# Patient Record
Sex: Female | Born: 1945
Health system: Southern US, Community
[De-identification: ages and names within clinical notes are randomized; demographics above are authoritative.]

## PROBLEM LIST (undated history)

## (undated) DIAGNOSIS — F410 Panic disorder [episodic paroxysmal anxiety] without agoraphobia: Secondary | ICD-10-CM

## (undated) DIAGNOSIS — M810 Age-related osteoporosis without current pathological fracture: Secondary | ICD-10-CM

## (undated) DIAGNOSIS — G4700 Insomnia, unspecified: Secondary | ICD-10-CM

## (undated) DIAGNOSIS — Z8489 Family history of other specified conditions: Secondary | ICD-10-CM

## (undated) DIAGNOSIS — K219 Gastro-esophageal reflux disease without esophagitis: Secondary | ICD-10-CM

## (undated) DIAGNOSIS — I10 Essential (primary) hypertension: Secondary | ICD-10-CM

## (undated) DIAGNOSIS — G2581 Restless legs syndrome: Secondary | ICD-10-CM

## (undated) DIAGNOSIS — E7800 Pure hypercholesterolemia, unspecified: Secondary | ICD-10-CM

## (undated) DIAGNOSIS — F329 Major depressive disorder, single episode, unspecified: Secondary | ICD-10-CM

## (undated) DIAGNOSIS — F32A Depression, unspecified: Secondary | ICD-10-CM

## (undated) DIAGNOSIS — IMO0001 Reserved for inherently not codable concepts without codable children: Secondary | ICD-10-CM

## (undated) DIAGNOSIS — M1990 Unspecified osteoarthritis, unspecified site: Secondary | ICD-10-CM

## (undated) HISTORY — DX: Reserved for inherently not codable concepts without codable children: IMO0001

## (undated) HISTORY — PX: RECTAL SURGERY: SHX760

## (undated) HISTORY — DX: Panic disorder (episodic paroxysmal anxiety): F41.0

## (undated) HISTORY — DX: Major depressive disorder, single episode, unspecified: F32.9

## (undated) HISTORY — DX: Restless legs syndrome: G25.81

## (undated) HISTORY — PX: DILATION AND CURETTAGE OF UTERUS: SHX78

## (undated) HISTORY — DX: Depression, unspecified: F32.A

## (undated) HISTORY — DX: Age-related osteoporosis without current pathological fracture: M81.0

## (undated) HISTORY — PX: JOINT REPLACEMENT: SHX530

## (undated) HISTORY — PX: BLADDER SURGERY: SHX569

## (undated) HISTORY — PX: TONSILLECTOMY: SUR1361

## (undated) HISTORY — DX: Insomnia, unspecified: G47.00

## (undated) HISTORY — DX: Gastro-esophageal reflux disease without esophagitis: K21.9

## (undated) HISTORY — PX: REPLACEMENT TOTAL KNEE: SUR1224

---

## 1985-09-22 HISTORY — PX: BREAST BIOPSY: SHX20

## 1996-09-22 HISTORY — PX: BREAST CYST EXCISION: SHX579

## 1998-11-01 ENCOUNTER — Other Ambulatory Visit: Admission: RE | Admit: 1998-11-01 | Discharge: 1998-11-01 | Payer: Self-pay | Admitting: *Deleted

## 1999-08-13 ENCOUNTER — Encounter: Payer: Self-pay | Admitting: Emergency Medicine

## 1999-08-13 ENCOUNTER — Inpatient Hospital Stay (HOSPITAL_COMMUNITY): Admission: AD | Admit: 1999-08-13 | Discharge: 1999-08-13 | Payer: Self-pay | Admitting: *Deleted

## 1999-08-13 ENCOUNTER — Emergency Department (HOSPITAL_COMMUNITY): Admission: EM | Admit: 1999-08-13 | Discharge: 1999-08-13 | Payer: Self-pay | Admitting: Emergency Medicine

## 1999-09-23 HISTORY — PX: ABDOMINAL HYSTERECTOMY: SHX81

## 1999-11-20 ENCOUNTER — Other Ambulatory Visit: Admission: RE | Admit: 1999-11-20 | Discharge: 1999-11-20 | Payer: Self-pay | Admitting: *Deleted

## 2000-11-19 ENCOUNTER — Other Ambulatory Visit: Admission: RE | Admit: 2000-11-19 | Discharge: 2000-11-19 | Payer: Self-pay | Admitting: *Deleted

## 2001-03-09 ENCOUNTER — Ambulatory Visit (HOSPITAL_COMMUNITY): Admission: RE | Admit: 2001-03-09 | Discharge: 2001-03-09 | Payer: Self-pay | Admitting: *Deleted

## 2001-03-09 ENCOUNTER — Encounter: Payer: Self-pay | Admitting: *Deleted

## 2001-09-22 HISTORY — PX: KNEE SURGERY: SHX244

## 2001-11-15 ENCOUNTER — Other Ambulatory Visit: Admission: RE | Admit: 2001-11-15 | Discharge: 2001-11-15 | Payer: Self-pay | Admitting: *Deleted

## 2002-03-10 ENCOUNTER — Ambulatory Visit (HOSPITAL_COMMUNITY): Admission: RE | Admit: 2002-03-10 | Discharge: 2002-03-10 | Payer: Self-pay | Admitting: *Deleted

## 2002-03-10 ENCOUNTER — Encounter: Payer: Self-pay | Admitting: *Deleted

## 2002-11-21 ENCOUNTER — Other Ambulatory Visit: Admission: RE | Admit: 2002-11-21 | Discharge: 2002-11-21 | Payer: Self-pay | Admitting: *Deleted

## 2003-03-13 ENCOUNTER — Ambulatory Visit (HOSPITAL_COMMUNITY): Admission: RE | Admit: 2003-03-13 | Discharge: 2003-03-13 | Payer: Self-pay | Admitting: *Deleted

## 2003-03-13 ENCOUNTER — Encounter: Payer: Self-pay | Admitting: *Deleted

## 2003-03-21 ENCOUNTER — Inpatient Hospital Stay (HOSPITAL_COMMUNITY): Admission: RE | Admit: 2003-03-21 | Discharge: 2003-03-22 | Payer: Self-pay | Admitting: *Deleted

## 2003-03-21 ENCOUNTER — Encounter (INDEPENDENT_AMBULATORY_CARE_PROVIDER_SITE_OTHER): Payer: Self-pay | Admitting: Specialist

## 2003-11-29 ENCOUNTER — Other Ambulatory Visit: Admission: RE | Admit: 2003-11-29 | Discharge: 2003-11-29 | Payer: Self-pay | Admitting: *Deleted

## 2004-01-05 ENCOUNTER — Ambulatory Visit (HOSPITAL_COMMUNITY): Admission: RE | Admit: 2004-01-05 | Discharge: 2004-01-05 | Payer: Self-pay | Admitting: Orthopedic Surgery

## 2004-03-14 ENCOUNTER — Ambulatory Visit (HOSPITAL_COMMUNITY): Admission: RE | Admit: 2004-03-14 | Discharge: 2004-03-14 | Payer: Self-pay | Admitting: Family Medicine

## 2004-09-19 ENCOUNTER — Ambulatory Visit: Payer: Self-pay | Admitting: Internal Medicine

## 2004-09-19 ENCOUNTER — Ambulatory Visit (HOSPITAL_COMMUNITY): Admission: RE | Admit: 2004-09-19 | Discharge: 2004-09-19 | Payer: Self-pay | Admitting: Internal Medicine

## 2004-09-19 HISTORY — PX: COLONOSCOPY: SHX174

## 2004-11-28 ENCOUNTER — Other Ambulatory Visit: Admission: RE | Admit: 2004-11-28 | Discharge: 2004-11-28 | Payer: Self-pay | Admitting: *Deleted

## 2005-03-17 ENCOUNTER — Ambulatory Visit (HOSPITAL_COMMUNITY): Admission: RE | Admit: 2005-03-17 | Discharge: 2005-03-17 | Payer: Self-pay | Admitting: Family Medicine

## 2005-04-03 ENCOUNTER — Ambulatory Visit (HOSPITAL_COMMUNITY): Admission: RE | Admit: 2005-04-03 | Discharge: 2005-04-03 | Payer: Self-pay | Admitting: Otolaryngology

## 2005-08-01 ENCOUNTER — Ambulatory Visit (HOSPITAL_COMMUNITY): Admission: RE | Admit: 2005-08-01 | Discharge: 2005-08-01 | Payer: Self-pay | Admitting: Family Medicine

## 2005-12-02 ENCOUNTER — Other Ambulatory Visit: Admission: RE | Admit: 2005-12-02 | Discharge: 2005-12-02 | Payer: Self-pay | Admitting: *Deleted

## 2006-03-19 ENCOUNTER — Ambulatory Visit (HOSPITAL_COMMUNITY): Admission: RE | Admit: 2006-03-19 | Discharge: 2006-03-19 | Payer: Self-pay | Admitting: *Deleted

## 2006-03-24 ENCOUNTER — Ambulatory Visit (HOSPITAL_COMMUNITY): Admission: RE | Admit: 2006-03-24 | Discharge: 2006-03-24 | Payer: Self-pay | Admitting: *Deleted

## 2007-03-22 ENCOUNTER — Ambulatory Visit (HOSPITAL_COMMUNITY): Admission: RE | Admit: 2007-03-22 | Discharge: 2007-03-22 | Payer: Self-pay | Admitting: *Deleted

## 2008-02-28 ENCOUNTER — Other Ambulatory Visit: Admission: RE | Admit: 2008-02-28 | Discharge: 2008-02-28 | Payer: Self-pay | Admitting: Gynecology

## 2008-03-22 ENCOUNTER — Ambulatory Visit (HOSPITAL_COMMUNITY): Admission: RE | Admit: 2008-03-22 | Discharge: 2008-03-22 | Payer: Self-pay | Admitting: Family Medicine

## 2008-09-22 HISTORY — PX: RECTOCELE REPAIR: SHX761

## 2008-09-22 HISTORY — PX: CYSTOCELE REPAIR: SHX163

## 2008-10-10 ENCOUNTER — Inpatient Hospital Stay (HOSPITAL_COMMUNITY): Admission: RE | Admit: 2008-10-10 | Discharge: 2008-10-11 | Payer: Self-pay | Admitting: Obstetrics and Gynecology

## 2009-02-01 ENCOUNTER — Ambulatory Visit (HOSPITAL_COMMUNITY): Admission: RE | Admit: 2009-02-01 | Discharge: 2009-02-01 | Payer: Self-pay | Admitting: Family Medicine

## 2009-03-23 ENCOUNTER — Ambulatory Visit (HOSPITAL_COMMUNITY): Admission: RE | Admit: 2009-03-23 | Discharge: 2009-03-23 | Payer: Self-pay | Admitting: Family Medicine

## 2009-05-30 ENCOUNTER — Ambulatory Visit (HOSPITAL_COMMUNITY): Admission: RE | Admit: 2009-05-30 | Discharge: 2009-05-30 | Payer: Self-pay | Admitting: Family Medicine

## 2009-06-13 ENCOUNTER — Encounter (HOSPITAL_COMMUNITY): Admission: RE | Admit: 2009-06-13 | Discharge: 2009-06-21 | Payer: Self-pay | Admitting: Family Medicine

## 2009-06-22 ENCOUNTER — Encounter (HOSPITAL_COMMUNITY): Admission: RE | Admit: 2009-06-22 | Discharge: 2009-07-22 | Payer: Self-pay | Admitting: Family Medicine

## 2010-03-26 ENCOUNTER — Ambulatory Visit (HOSPITAL_COMMUNITY): Admission: RE | Admit: 2010-03-26 | Discharge: 2010-03-26 | Payer: Self-pay | Admitting: Family Medicine

## 2010-10-15 ENCOUNTER — Ambulatory Visit (HOSPITAL_COMMUNITY)
Admission: RE | Admit: 2010-10-15 | Discharge: 2010-10-15 | Payer: Self-pay | Source: Home / Self Care | Attending: Family Medicine | Admitting: Family Medicine

## 2010-10-16 ENCOUNTER — Ambulatory Visit (HOSPITAL_COMMUNITY)
Admission: RE | Admit: 2010-10-16 | Discharge: 2010-10-16 | Payer: Self-pay | Source: Home / Self Care | Attending: Family Medicine | Admitting: Family Medicine

## 2011-01-06 LAB — CBC
HCT: 30.3 % — ABNORMAL LOW (ref 36.0–46.0)
HCT: 38.3 % (ref 36.0–46.0)
Hemoglobin: 10.6 g/dL — ABNORMAL LOW (ref 12.0–15.0)
MCHC: 34.1 g/dL (ref 30.0–36.0)
MCHC: 35 g/dL (ref 30.0–36.0)
MCV: 91.2 fL (ref 78.0–100.0)
RBC: 3.33 MIL/uL — ABNORMAL LOW (ref 3.87–5.11)
RBC: 4.24 MIL/uL (ref 3.87–5.11)
RDW: 12.5 % (ref 11.5–15.5)
WBC: 6.3 10*3/uL (ref 4.0–10.5)

## 2011-01-06 LAB — URINALYSIS, ROUTINE W REFLEX MICROSCOPIC
Bilirubin Urine: NEGATIVE
Ketones, ur: NEGATIVE mg/dL
Specific Gravity, Urine: 1.015 (ref 1.005–1.030)
Urobilinogen, UA: 0.2 mg/dL (ref 0.0–1.0)

## 2011-01-06 LAB — COMPREHENSIVE METABOLIC PANEL
ALT: 21 U/L (ref 0–35)
Alkaline Phosphatase: 50 U/L (ref 39–117)
Calcium: 9 mg/dL (ref 8.4–10.5)
GFR calc Af Amer: >60 mL/min (ref >60)
Sodium: 137 mEq/L (ref 135–145)
Total Bilirubin: 0.7 mg/dL (ref 0.3–1.2)
Total Protein: 7.1 g/dL (ref 6.0–8.3)

## 2011-01-06 LAB — BASIC METABOLIC PANEL
Calcium: 7.9 mg/dL — ABNORMAL LOW (ref 8.4–10.5)
Chloride: 108 mEq/L (ref 96–112)
GFR calc non Af Amer: >60 mL/min (ref >60)
Potassium: 4.4 mEq/L (ref 3.5–5.1)

## 2011-02-04 NOTE — Op Note (Signed)
NAME:  Allison Freeman, Allison Freeman NO.:  [id number]   MEDICAL RECORD NO.:  [id number]          PATIENT TYPE:  INP   LOCATION:  9316                          FACILITY:  [facility]   PHYSICIAN:  Randye Lobo, [name]   DATE OF BIRTH:  1946/03/09   DATE OF PROCEDURE:  DATE OF DISCHARGE:                               OPERATIVE REPORT   PREOPERATIVE DIAGNOSES:  1. Genuine stress incontinence.  2. Incomplete vaginal prolapse.   POSTOPERATIVE DIAGNOSES:  1. Genuine stress incontinence.  2. Incomplete vaginal prolapse.   PROCEDURE:  Anterior and posterior colporrhaphy, tension-free vaginal  tape, mid urethral sling, cystoscopy.   SURGEON:  Randye Lobo, MD   ASSISTANT:  Miguel Aschoff, MD   ANESTHESIA:  General endotracheal, local with 0.5% lidocaine with  epinephrine 1:200,000.   IV FLUIDS:  1600 mL Ringer lactate.   ESTIMATED BLOOD LOSS:  50 mL.   URINE OUTPUT:  Quantity sufficient.   COMPLICATIONS:  None.   INDICATIONS FOR THE PROCEDURE:  The patient is a 65 year old, gravida 3,  para 2-0-1-2, Caucasian female, status post total vaginal hysterectomy  and status post bilateral salpingo-oophorectomy, who was referred by Dr.  Teodora Medici for evaluation and treatment of urinary incontinence.  The  patient reports leakage of urine with coughing, sneezing, and laughing,  and she reports a history of constipation.  On pelvic examination, the  patient in the office was noted to have both a cystocele and rectocele.  Urodynamic testing confirmed the presence of genuine stress  incontinence.  A plan is now made to proceed with an anterior and  posterior colporrhaphy along with a tension-free vaginal tape mid  urethral sling and cystoscopy after risks, benefits, and alternatives  are reviewed.   FINDINGS:  Exam under anesthesia revealed a very minimal cystocele.  There was a second-degree rectocele.  The cervix was absent.  Cystoscopy  demonstrated the bladder to be normal throughout  360 degrees including  the bladder dome and trigone.  There was no evidence of a foreign body  in the bladder or the urethra during placement of the sling.  The  ureters were noted to be patent bilaterally.   SPECIMENS:  None.   PROCEDURE:  The patient was reidentified in the preoperative hold area.  She received ciprofloxacin 400 mg IV for antibiotic prophylaxis, and she  received both TED hose and PAS stockings for DVT prophylaxis.   In the operating room, general endotracheal anesthesia was induced and  the patient was then placed in the dorsal lithotomy position.  The lower  abdomen, vagina, and perineum were then sterilely prepped and draped.  A  Foley catheter was placed inside the bladder.   A weighted speculum was placed inside the vagina, and Allis clamps were  placed along the anterior vaginal wall from a distance of 1 cm below the  urethral meatus down to a distance of 4 cm.  The vaginal mucosa was  injected locally with 0.5% lidocaine with 1:200,000 of epinephrine.  The  vaginal mucosa was then incised in the midline with a scalpel.  Sharp  dissection was used to dissect the  subvaginal tissue and bladder off the  overlying vaginal mucosa.  Hemostasis was created during the dissection  using monopolar cautery.   A 1-cm incisions were then created 2 cm to the right and left of the  midline of the pubic symphysis.  The sling was performed in a top-down  fashion.  The abdominal needle passer was placed through the right  suprapubic incision and out through the vaginal endopelvic fascia on the  ipsilateral side.  The same procedure that was performed on the right-  hand side was then repeated on the left-hand side.   The Foley catheter was removed at this time and cystoscopy was performed  and the findings are as noted above.  The bladder was then drained of  cystoscopy fluid, and the Foley catheter was replaced.  The sling was  attached to the abdominal needle passers and  was drawn up through the  suprapubic incisions bilaterally.  The plastic sheaths were removed from  the surrounding sling as a Kelly clamp was placed between the urethra  and the sling.  The sling was noted to be in good position.  Excess  sling was trimmed suprapubically.  A single colporrhaphy suture of 2-0  Vicryl placed in a vertical mattress fashion was used to reduce the  minimal cystocele.  Excess vaginal mucosa was trimmed.  There was a  small amount of oozing noted from the exit site of the sling on the  patient's right-hand side.  This responded to Gelfoam.  The anterior  vaginal wall was then closed with a running locked suture of 2-0 Vicryl.   Attention was then turned to the posterior vaginal wall.  Allis clamps  were used to mark the midline of the posterior vaginal mucosa.  The  perineal body and the posterior vaginal mucosa was then injected with  0.5% lidocaine with 1:200,000 of epinephrine.  A triangular wedge of  epithelium was excised from the perineal body superficially.  The  posterior vaginal mucosa was then incised vertically in the midline with  a Metzenbaum scissors.  The perirectal fascia and rectum were dissected  off the overlying vaginal epithelium up to the top of the rectocele.  It  appeared that there was indeed a small enterocele appreciated.  Hemostasis was created during the dissection using monopolar cautery.  For the posterior colporrhaphy repair, 2 figure-of-eight sutures of 2-0  Vicryl were used to reduce the enterocele.  The rectocele repair was  then performed with vertical mattress sutures using a combination of 2-0  Vicryl and 0 Vicryl.  This was performed in layers.   Rectal exam was performed and there was the absence of sutures in the  rectum.   Excess posterior vaginal mucosa was then trimmed, and the posterior  vaginal wall was closed with a running locked suture of 2-0 Vicryl which  was continued along the perineal body as for an  episiotomy repair.  Two  crown sutures of 0 Vicryl were placed to build up the perineal body  prior to its closure.   The suprapubic incisions were closed with Dermabond, and a vaginal  packing with Estrace cream was then placed inside the vagina.   This concluded the patient's procedure.  There were no complications.  All needle, instrument, and sponge counts were correct.      Randye Lobo, [name]  Electronically Signed     BES/MEDQ  D:  10/10/2008  T:  10/10/2008  Job:  [id number]   cc:   Leatha Gilding.  Mezer, [name]  Fax: [phone]

## 2011-02-07 NOTE — Op Note (Signed)
NAMEJERMIAH, Allison Freeman                ACCOUNT NO.:  [id number]   MEDICAL RECORD NO.:  [id number]          PATIENT TYPE:  AMB   LOCATION:  DAY                           FACILITY:  [facility]   PHYSICIAN:  R. Roetta Sessions, [name] DATE OF BIRTH:  01-23-1946   DATE OF PROCEDURE:  09/19/2004  DATE OF DISCHARGE:                                 OPERATIVE REPORT   INDICATIONS FOR PROCEDURE:  The patient is a 65 year old lady referred at  courtesy of Dr. Simone Curia for colorectal cancer screening.  She is  devoid of any lower GI tract symptoms.  There is no family history of  colorectal neoplasia.  She has never had a colonoscopy.  Colonoscopy is now  being done as a screening maneuver.  This approach has been discussed with  the patient at length.  Potential risks, benefits, and alternatives have  been reviewed.  Questions were answered.  She is agreeable.  Please see  documentation in medical record for more information.   DESCRIPTION OF PROCEDURE:  O2 saturation, blood pressure, pulse, and  respirations were monitored throughout the entire procedure.   Conscious sedation:  Versed 4 mg IV and Demerol 75 in divided doses.   Instrument:  Olympus video scope system.   Findings:  Digital rectal exam revealed no abnormalities.  On scout  findings, prep was good.   Rectum:  Examination of the rectal mucosa including a retroflexion view of  the anal verge revealed only internal hemorrhoids.   Colon:  The colonic mucosa was surveyed from the rectosigmoid junction  through the low transverse right colon to the area of the appendiceal  orifice, ileocecal valve, and cecum.  This junction was well seen and  photographed for the record.  The scope was slowly withdrawn.  All  previously mentioned mucosal surfaces were again seen.  The patient was  noted to have sigmoid diverticula.  Her colonic mucosa appeared normal.  The  patient tolerated the procedure well and was reactive in endoscopy.   IMPRESSION:  1.  Internal hemorrhoids, otherwise normal rectum.  2.  Sigmoid diverticula.  Remainder of colon mucosa appeared normal.   RECOMMENDATIONS:  Diverticulosis literature provided to Ms Weeks.  Repeat  screening colonoscopy in 10 years.     Otelia Sergeant   RMR/MEDQ  D:  09/19/2004  T:  09/19/2004  Job:  [id number]   cc:   Almedia Balls. Fore, [name]  [phone] [address]  [place]  [place] [address]  Fax: [phone]   [name], [name]  [address]  [place]  [place] [address]  Fax: [phone]

## 2011-02-07 NOTE — H&P (Signed)
NAMESALAM, Allison Freeman                            ACCOUNT NO.:  [id number]   MEDICAL RECORD NO.:  [id number]                    PATIENT TYPE:   LOCATION:                                       FACILITY:   PHYSICIAN:  Almedia Balls. Fore, [name]                DATE OF BIRTH:   DATE OF ADMISSION:  03/21/2003  DATE OF DISCHARGE:                                HISTORY & PHYSICAL   CHIEF COMPLAINT:  Pain, ovarian cyst.   HISTORY:  The patient is a 65 year old, status post hysterectomy with  recurring ovarian cysts and several prior laparoscopies for ovarian  cystectomies.  She again has development of an ovarian cyst with pain and is  admitted at this time for exploratory laparotomy, bilateral salpingo-  oophorectomy, and indicated procedures.  She has been fully counseled as to  the nature of the procedure and the risks involved to include risks of  anesthesia, injury to bowel, bladder, blood vessels, ureters, postoperative  hemorrhage, recuperation, and continuation of hormone replacement which she  is taking at the present time.  She fully understands all these  considerations and wishes to proceed on March 21, 2003.   PAST MEDICAL HISTORY:  1. Hysterectomy, as noted above.  2. Laparoscopy in the past for ovarian cystectomy and lysis of adhesions.   ALLERGIES:  She is allergic to no medications.   MEDICATIONS:  Takes hormone replacement and analgesics for the pain at this  time.   FAMILY HISTORY:  Includes a sister with breast cancer and mother with breast  cancer.   REVIEW OF SYSTEMS:  HEENT:  Sinus congestion because of allergies.  CARDIORESPIRATORY:  Negative.  GASTROINTESTINAL:  Negative.  GENITOURINARY:  As in present illness.  NEUROMUSCULAR:  Negative.   PHYSICAL EXAMINATION:  VITAL SIGNS:  Height 5 feet 5-1/4 inches, weight 160  pounds.  Blood pressure 130/70, pulse 84, respirations 18.  GENERAL:  Well-developed white female in no acute distress.  HEENT:  Within normal limits.  NECK:  Supple.  Without masses, adenopathy, or bruit.  HEART:  Regular rate and rhythm.  Without murmurs.  LUNGS:  Clear to P&A.  BREASTS:  Sitting and lying without mass.  Axilla negative.  ABDOMEN:  Flat and soft.  With tenderness in both lower quadrants, left  slightly greater than right.  PELVIC:  External genitalia, Bartholin, urethral, and Skene's glands within  normal limits.  Cervix and uterus are previously surgically absent.  Adnexal  exam reveals fullness and tenderness, moreso on the right than the left.  RECTOVAGINAL:  Confirmatory.  EXTREMITIES:  Within normal limits.  CENTRAL NERVOUS SYSTEM:  Grossly intact.  SKIN:  Without suspicious lesions.   IMPRESSION:  1. Recurrent ovarian cyst.  2. Pelvic pain.   DISPOSITION:  As noted above.  Of note is that a Pap smear was normal in  March 2004.  Almedia Balls. [name], [name]    SRF/MEDQ  D:  03/09/2003  T:  03/09/2003  Job:  [id number]

## 2011-02-07 NOTE — Op Note (Signed)
Allison Freeman, Allison Freeman                          ACCOUNT NO.:  [id number]   MEDICAL RECORD NO.:  [id number]                   PATIENT TYPE:  INP   LOCATION:  X006                                 FACILITY:  [facility]   PHYSICIAN:  Almedia Balls. Fore, [name]                DATE OF BIRTH:  11-15-1945   DATE OF PROCEDURE:  03/21/2003  DATE OF DISCHARGE:                                 OPERATIVE REPORT   PREOPERATIVE DIAGNOSES:  Status post hysterectomy, persisting recurrent  ovarian cyst, pelvic pain, history of pelvic adhesions.   POSTOPERATIVE DIAGNOSES:  Status post hysterectomy, persistent recurrent  ovarian cyst, pelvic pain, history of pelvic adhesions, pending pathology.   OPERATION:  Exploratory laparotomy, enterolysis, bilateral salpingo-  oophorectomy.   ANESTHESIA:  General orotracheal.   OPERATOR:  Almedia Balls. [name], [name]   FIRST ASSISTANT:  Leona Singleton, [name]   INDICATION FOR SURGERY:  The patient is a 65 year old with the above-noted  problems.  She has been counseled as to the nature of the procedure, and the  risks involved to include risks of anesthesia, injury to bowel, bladder,  blood vessels, or ureter, postoperative hemorrhage, infection, recuperation,  and use of hormone therapy following surgery.  She fully understands all  these considerations and wishes to proceed on 03/21/03.   OPERATIVE FINDINGS:  On entry into the abdomen, there were adhesions  involving loops of bowel to the lateral peritoneal surface and to both  adnexal areas.  The uterus was previously surgically absent.  Exploration of  the upper abdomen revealed all to be normal to palpation.   PROCEDURE:  With the patient under general anesthesia, prepared and draped  in the usual sterile fashion, with a Foley catheter in the bladder, a lower  abdominal transverse incision was made and carried into the peritoneal  cavity without difficulty.  Self-retaining retractor was placed, and the  bowel was packed off.   The adhesions involving the loops of bowel to adnexal  structures were lysed using Bovie electrocoagulation.  The retroperitoneal  spaces bilaterally were opened with identification of the infundibulopelvic  ligaments well above the course of the ureters bilaterally.  These ligaments  were then clamped using Heaney clamps, cut, and the pedicles were doubly  ligated with 1 chromic catgut.  Both tubes and ovaries were excised  following further lysis of adhesions with peritoneal adhesions as well.  The  area was lavaged with copious amounts of lactated Ringer's solution, and  after noting that hemostasis was maintained and the sponge and instrument  counts were correct, the peritoneum was closed with a continuous suture of 0  Vicryl.  Fascia was closed with two sutures of 0 Vicryl which were brought  from the lateral aspects of the incision and tied separately in the midline.  Subcutaneous fat was reapproximated with interrupted  sutures of 0 Vicryl.  Skin was closed with a subcuticular  suture of  3-0  plain catgut.  Estimated blood loss:  50 mL.  The patient was taken to the  recovery room in good condition with clear urine in the Foley catheter  tubing.  She will be admitted following surgery.                                               Almedia Balls. [name], [name]    SRF/MEDQ  D:  03/21/2003  T:  03/21/2003  Job:  [id number]   cc:   Leona Singleton, [name]  [address]  [place]  [place] [address]  Fax: [phone]

## 2011-02-07 NOTE — Discharge Summary (Signed)
   Allison Freeman, Allison Freeman                          ACCOUNT NO.:  [id number]   MEDICAL RECORD NO.:  [id number]                   PATIENT TYPE:  INP   LOCATION:  0480                                 [facility]ACILITY:  [facility]   PHYSICIAN:  Almedia Balls. Fore, [name]                DATE OF BIRTH:  24-Aug-1946   DATE OF ADMISSION:  03/21/2003  DATE OF DISCHARGE:  03/22/2003                                 DISCHARGE SUMMARY   HISTORY:  The patient is 65 year old status post hysterectomy with recurring  ovarian cyst, pelvic pain, history of pelvic adhesions or exploratory  laparotomy, bilateral salpingo-oophorectomy on June 29. The remainder of the  history and physical as previously dictated.   LABORATORY DATA:  Preoperative hemoglobin 12.6, 5200 white blood cells,  normal electrocardiogram.   HOSPITAL COURSE:  The patient was taken to the operating room on June 29 at  which time exploratory laparotomy, enterolysis, bilateral salpingo-  oophorectomy were performed without difficulty. The patient did well  postoperatively. Diet and ambulation progressed over the evening of June 29  and the morning of March 22, 2003. On the morning of June 30, she was  afebrile and experiencing no problems, and it was felt that she could be  discharged at this time.   FINAL DIAGNOSES:  Pelvic pain secondary to recurring ovarian cysts, pelvic  adhesions.   OPERATION:  Exploratory laparotomy, lysis of adhesions with enterolysis,  bilateral salpingo-oophorectomy. Pathology report unavailable at the time of  dictation.   DISPOSITION:  Discharged home to return to the office in two weeks for  followup. She was instructed to gradually progress her activity over several  weeks at home and to limit lifting and driving for two weeks. She was fully  ambulatory, on a regular diet, and in good condition at the time of  discharge. She was given a prescription for Dilaudid __________2 mg #30 to  be taken 1 or 2 q. 4h p.r.n.  pain.                                               Almedia Balls. [name], [name]    SRF/MEDQ  D:  03/22/2003  T:  03/22/2003  Job:  [id number]

## 2011-02-07 NOTE — Discharge Summary (Signed)
NAMELUA, FENG            ACCOUNT NO.:  [id number]   MEDICAL RECORD NO.:  [id number]          PATIENT TYPE:  INP   LOCATION:  9316                          FACILITY:  [facility]   PHYSICIAN:  Randye Lobo, [name]   DATE OF BIRTH:  March 21, 1946   DATE OF ADMISSION:  10/10/2008  DATE OF DISCHARGE:  10/11/2008                               DISCHARGE SUMMARY   ADMISSION DIAGNOSES:  1. Genuine stress incontinence.  2. Incomplete vaginal prolapse.   DISCHARGE DIAGNOSES:  1. Genuine stress incontinence.  2. Incomplete vaginal prolapse.  3. Status post anterior and posterior colporrhaphy, tension free      vaginal tape, midurethral sling, cystoscopy.  4. Urinary retention.   SIGNIFICANT OPERATIONS AND PROCEDURES:  The patient underwent an  anterior and posterior colporrhaphy with a tension free vaginal tape and  cystoscopy on 10/10/2008 under the direction of Dr. Conley Simmonds with the  assistance of Dr. Dorma Russell.   ADMISSION HISTORY AND PHYSICAL EXAMINATION:  The patient is a 65-year-  old gravida 3, para 2-0-1-2 Caucasian female, status post total vaginal  hysterectomy and status post bilateral salpingo-oophorectomy who was  referred by Dr. Teodora Medici for evaluation and treatment of urinary  incontinence.  The patient reported urinary incontinence with stressful  maneuvers and she also reported significant constipation symptoms.   On physical exam, the patient was noted to have both a cystocele and  rectocele, and urodynamic testing confirmed the presence of genuine  stress incontinence.   The patient was admitted on 10/10/2008 at which time she underwent an  anterior and posterior colporrhaphy with a tension-free vaginal tape,  midurethral sling, and cystoscopy which was performed without  complications.   Postoperatively, the patient had a benign surgical recovery.  Her pain  was controlled with a morphine PCA and Toradol over night.  By postop  day #1, the patient was taking  oral pain medication including Percocet  and Ibuprofen which controlled her pain well.  The patient was  tolerating a regular diet and ambulating independently.   The patient's vaginal packing was removed on postoperative day #1.  Her  Foley catheter was also removed and she began voiding trails.  The  patient had residuals greater than 100 mL and a Foley catheter was  therefore replaced and left to gravity drainage.   DISPOSITION:  The patient is discharged to home on postoperative day #2.   The patient will take the following medications, Percocet 5/325 mg 1-2  p.o. q.4-6 hours p.r.n. pain, ibuprofen 600 mg p.o. q.6 hours p.r.n.  pain.   The patient will have decreased activity for the next 12 weeks.  She  will not drive for the next 1-2 weeks.   The patient will follow a regular diet.   The patient will follow up in office in 1 week.  She will leave her  Foley catheter to gravity drainage.   The patient will call the office if she experiences problems with fever,  nausea and vomiting, pain uncontrolled by her medication, active vaginal  bleeding, blood in the urine, difficulty with her catheter, or any other  concern.  Randye Lobo, [name]  Electronically Signed     BES/MEDQ  D:  11/01/2008  T:  11/02/2008  Job:  [id number]

## 2011-03-07 ENCOUNTER — Other Ambulatory Visit: Payer: Self-pay | Admitting: Family Medicine

## 2011-03-07 DIAGNOSIS — Z139 Encounter for screening, unspecified: Secondary | ICD-10-CM

## 2011-04-01 ENCOUNTER — Ambulatory Visit (HOSPITAL_COMMUNITY)
Admission: RE | Admit: 2011-04-01 | Discharge: 2011-04-01 | Disposition: A | Discharge status: Home / Self Care | Payer: Medicare Other | Source: Ambulatory Visit | Attending: Family Medicine | Admitting: Family Medicine

## 2011-04-01 DIAGNOSIS — Z1231 Encounter for screening mammogram for malignant neoplasm of breast: Secondary | ICD-10-CM | POA: Insufficient documentation

## 2011-04-01 DIAGNOSIS — Z139 Encounter for screening, unspecified: Secondary | ICD-10-CM

## 2011-08-08 ENCOUNTER — Emergency Department: Payer: Self-pay | Admitting: Emergency Medicine

## 2011-10-06 DIAGNOSIS — S9030XA Contusion of unspecified foot, initial encounter: Secondary | ICD-10-CM | POA: Diagnosis not present

## 2011-10-26 ENCOUNTER — Encounter (HOSPITAL_COMMUNITY): Payer: Self-pay

## 2011-10-26 ENCOUNTER — Other Ambulatory Visit: Payer: Self-pay

## 2011-10-26 ENCOUNTER — Emergency Department (HOSPITAL_COMMUNITY): Payer: Medicare Other

## 2011-10-26 ENCOUNTER — Emergency Department (HOSPITAL_COMMUNITY)
Admission: EM | Admit: 2011-10-26 | Discharge: 2011-10-26 | Disposition: A | Discharge status: Home / Self Care | Payer: Medicare Other | Attending: Emergency Medicine | Admitting: Emergency Medicine

## 2011-10-26 DIAGNOSIS — K219 Gastro-esophageal reflux disease without esophagitis: Secondary | ICD-10-CM | POA: Diagnosis not present

## 2011-10-26 DIAGNOSIS — M129 Arthropathy, unspecified: Secondary | ICD-10-CM | POA: Diagnosis not present

## 2011-10-26 DIAGNOSIS — E7800 Pure hypercholesterolemia, unspecified: Secondary | ICD-10-CM | POA: Diagnosis not present

## 2011-10-26 DIAGNOSIS — I10 Essential (primary) hypertension: Secondary | ICD-10-CM | POA: Insufficient documentation

## 2011-10-26 DIAGNOSIS — R072 Precordial pain: Secondary | ICD-10-CM | POA: Diagnosis not present

## 2011-10-26 DIAGNOSIS — Z9079 Acquired absence of other genital organ(s): Secondary | ICD-10-CM | POA: Diagnosis not present

## 2011-10-26 DIAGNOSIS — R079 Chest pain, unspecified: Secondary | ICD-10-CM | POA: Diagnosis not present

## 2011-10-26 HISTORY — DX: Gastro-esophageal reflux disease without esophagitis: K21.9

## 2011-10-26 HISTORY — DX: Pure hypercholesterolemia, unspecified: E78.00

## 2011-10-26 HISTORY — DX: Unspecified osteoarthritis, unspecified site: M19.90

## 2011-10-26 HISTORY — DX: Essential (primary) hypertension: I10

## 2011-10-26 LAB — COMPREHENSIVE METABOLIC PANEL
ALT: 21 U/L (ref 0–35)
Albumin: 3.5 g/dL (ref 3.5–5.2)
Alkaline Phosphatase: 66 U/L (ref 39–117)
BUN: 27 mg/dL — ABNORMAL HIGH (ref 6–23)
Calcium: 9.9 mg/dL (ref 8.4–10.5)
GFR calc Af Amer: 64 mL/min — ABNORMAL LOW (ref >90)
Glucose, Bld: 106 mg/dL — ABNORMAL HIGH (ref 70–99)
Potassium: 3.7 mEq/L (ref 3.5–5.1)
Sodium: 138 mEq/L (ref 135–145)
Total Protein: 7.4 g/dL (ref 6.0–8.3)

## 2011-10-26 LAB — CBC
Hemoglobin: 12.4 g/dL (ref 12.0–15.0)
MCH: 30 pg (ref 26.0–34.0)
MCHC: 34.6 g/dL (ref 30.0–36.0)
RDW: 12.6 % (ref 11.5–15.5)

## 2011-10-26 LAB — POCT I-STAT TROPONIN I
Troponin i, poc: 0.02 ng/mL (ref 0.00–0.08)
Troponin i, poc: 0 ng/mL (ref 0.00–0.08)

## 2011-10-26 MED ORDER — PANTOPRAZOLE SODIUM 40 MG IV SOLR
40.0000 mg | Freq: Once | INTRAVENOUS | Status: AC
  Administered 2011-10-26: 40 mg via INTRAVENOUS
  Filled 2011-10-26: qty 40

## 2011-10-26 MED ORDER — NITROGLYCERIN 0.4 MG SL SUBL
0.4000 mg | SUBLINGUAL_TABLET | SUBLINGUAL | Status: DC | PRN
  Administered 2011-10-26: 0.4 mg via SUBLINGUAL
  Filled 2011-10-26: qty 25

## 2011-10-26 MED ORDER — ASPIRIN 325 MG PO TABS
325.0000 mg | ORAL_TABLET | ORAL | Status: AC
  Administered 2011-10-26: 325 mg via ORAL
  Filled 2011-10-26: qty 1

## 2011-10-26 MED ORDER — SODIUM CHLORIDE 0.9 % IV SOLN
Freq: Once | INTRAVENOUS | Status: AC
  Administered 2011-10-26: 15:00:00 via INTRAVENOUS

## 2011-10-26 NOTE — ED Notes (Signed)
Pt c/o mid sternal chest pain since 0900 this am. Pt states that it radiates to her back. States that her pain is better now. Pt denies nausea, vomiting or shortness of breath. States that she took 2 Tums and her Nexium this morning without relief. Pt showing NSR on cardiac monitor. Pt alert and oriented x 3. Skin warm and dry. Color pink. Breath sounds clear and equal bilaterally.

## 2011-10-26 NOTE — ED Notes (Signed)
Pt presents with substernal chest pain that radiates to back. Pt states she also has a "sour taste in my mouth". Pt states pain increases with deep breath.

## 2011-10-26 NOTE — ED Notes (Signed)
Pt resting comfortably on stretcher. Cardiac monitoring showing NSR. Pt states that the pain is better now but still hurts a little.

## 2011-10-26 NOTE — ED Provider Notes (Signed)
History     CSN: [id number]  Arrival date & time 10/26/11  1353   First MD Initiated Contact with Patient 10/26/11 1455      Chief Complaint  Patient presents with  . Chest Pain    (Consider location/radiation/quality/duration/timing/severity/associated sxs/prior Treatment)   HPI Allison Freeman is a 66 y.o. female who presents to the Emergency Department complaining of mid to substernal chest pain that began this morning at 9:30 AM. She had continued pain throughout the church service and developed a sour taste in her mouth. She denies fever, chills, cough, shortness of breath, nausea, vomiting. Pain when she arrived in the emergency room was a 7/10.  PCP Dr. Gerda Diss   Past Medical History  Diagnosis Date  . Acid reflux   . Hypertension   . Hypercholesterolemia   . Arthritis     Past Surgical History  Procedure Date  . Abdominal hysterectomy   . Tonsillectomy   . Rectal surgery   . Bladder surgery     No family history on file.  History  Substance Use Topics  . Smoking status: Never Smoker   . Smokeless tobacco: Not on file  . Alcohol Use: No    OB History    Grav Para Term Preterm Abortions TAB SAB Ect Mult Living                  Review of Systems A 10 review of systems reviewed and are negative for acute change except as noted in the HPI. Allergies  Review of patient's allergies indicates no known allergies.  Home Medications   Current Outpatient Rx  Name Route Sig Dispense Refill  . ETODOLAC 400 MG PO TABS Oral Take 400 mg by mouth daily.    Marland Kitchen GLUCOSAMINE-CHONDROITIN 250-200 MG PO TABS Oral Take 1 tablet by mouth 2 (two) times daily.    Marland Kitchen ONE-DAILY MULTI VITAMINS PO TABS Oral Take 1 tablet by mouth daily.    Marland Kitchen PRESERVISION AREDS PO TABS Oral Take 1 tablet by mouth daily.    Marland Kitchen FISH OIL 1200 MG PO CAPS Oral Take 1 capsule by mouth daily.    Marland Kitchen OMEPRAZOLE 20 MG PO CPDR Oral Take 20 mg by mouth daily.    Marland Kitchen PRAVASTATIN SODIUM 40 MG PO TABS Oral  Take 40 mg by mouth daily.    Marland Kitchen ROPINIROLE HCL 1 MG PO TABS Oral Take 1 mg by mouth daily.    . TRIAMTERENE-HCTZ 37.5-25 MG PO TABS Oral Take 1 tablet by mouth daily.      BP 129/69  Pulse 85  Temp(Src) 97.9 F (36.6 C) (Oral)  Resp 18  Ht 5\' 5"  (1.651 m)  Wt 170 lb (77.111 kg)  BMI 28.29 kg/m2  SpO2 94%  Physical Exam  Nursing note and vitals reviewed. Constitutional: She is oriented to person, place, and time. She appears well-developed and well-nourished. No distress.  HENT:  Head: Normocephalic.  Right Ear: External ear normal.  Left Ear: External ear normal.  Mouth/Throat: Oropharynx is clear and moist.  Eyes: EOM are normal. Pupils are equal, round, and reactive to light.  Neck: Normal range of motion. Neck supple.  Cardiovascular: Normal rate, normal heart sounds and intact distal pulses.   Pulmonary/Chest: Effort normal and breath sounds normal.  Abdominal: Soft. Bowel sounds are normal. There is no tenderness.  Musculoskeletal: Normal range of motion.  Neurological: She is alert and oriented to person, place, and time.  Skin: Skin is warm and dry.  ED Course  Procedures (including critical care time) Results for orders placed during the hospital encounter of 10/26/11  CBC      Component Value Range   WBC 8.2  4.0 - 10.5 (K/uL)   RBC 4.14  3.87 - 5.11 (MIL/uL)   Hemoglobin 12.4  12.0 - 15.0 (g/dL)   HCT 16.1 (*) 09.6 - 46.0 (%)   MCV 86.5  78.0 - 100.0 (fL)   MCH 30.0  26.0 - 34.0 (pg)   MCHC 34.6  30.0 - 36.0 (g/dL)   RDW 04.5  40.9 - 81.1 (%)   Platelets 248  150 - 400 (K/uL)  COMPREHENSIVE METABOLIC PANEL      Component Value Range   Sodium 138  135 - 145 (mEq/L)   Potassium 3.7  3.5 - 5.1 (mEq/L)   Chloride 104  96 - 112 (mEq/L)   CO2 24  19 - 32 (mEq/L)   Glucose, Bld 106 (*) 70 - 99 (mg/dL)   BUN 27 (*) 6 - 23 (mg/dL)   Creatinine, Ser 9.14  0.50 - 1.10 (mg/dL)   Calcium 9.9  8.4 - 78.2 (mg/dL)   Total Protein 7.4  6.0 - 8.3 (g/dL)   Albumin  3.5  3.5 - 5.2 (g/dL)   AST 24  0 - 37 (U/L)   ALT 21  0 - 35 (U/L)   Alkaline Phosphatase 66  39 - 117 (U/L)   Total Bilirubin 0.7  0.3 - 1.2 (mg/dL)   GFR calc non Af Amer 55 (*) >90 (mL/min)   GFR calc Af Amer 64 (*) >90 (mL/min)  POCT I-STAT TROPONIN I      Component Value Range   Troponin i, poc 0.02  0.00 - 0.08 (ng/mL)   Comment 3           POCT I-STAT TROPONIN I      Component Value Range   Troponin i, poc 0.00  0.00 - 0.08 (ng/mL)   Comment 3             Dg Chest 2 View  10/26/2011  *RADIOLOGY REPORT*  Clinical Data: Chest pain  CHEST - 2 VIEW  Comparison: 10/15/2010  Findings: Normal heart size and vascularity.  Telemetry leads overlie the chest.  Negative for CHF, pneumonia, edema, or effusion.  Slightly lower lung volumes.  Background emphysema suspected.  IMPRESSION: Low volume exam.  No acute pneumonia or CHF  Original Report Authenticated By: Judie Petit. Ruel Favors, [name]   Date: 10/26/2011  1400  Rate: 101  Rhythm: sinus tachycardia  QRS Axis: normal  Intervals: normal  ST/T Wave abnormalities: normal  Conduction Disutrbances:none  Narrative Interpretation:   Old EKG Reviewed: none available   1455 After baby aspirin given, pain 3/10. 1554 NTG SL given with no change in discomfort.    MDM  Patient will chest pain that radiated to her back and was associated with a sour taste in her mouth. Patient with a h/o GERD on proton pump inhibitor. Given IVF, proton pump inhibitor. ASA and NTG did not relieve discomfort. Proton pump inhibitor improved pain. Labs unremarkable. EKG normal, cardiac enzymes x 2 negative, xray negative for any acute process. Pt feels improved after observation and/or treatment in ED.Pt stable in ED with no significant deterioration in condition.The patient appears reasonably screened and/or stabilized for discharge and I doubt any other medical condition or other [facility] requiring further screening, evaluation, or treatment in the ED at this time prior to  discharge.  MDM Reviewed: nursing  note and vitals Interpretation: labs, ECG and x-ray          [facility]. Colon Branch, MD 10/27/11 1727

## 2011-10-26 NOTE — ED Notes (Signed)
Pt resting comfortably on stretcher. C/o pain in chest with movement only. Pt showing NSR on cardiac monitor.

## 2011-10-26 NOTE — ED Notes (Signed)
Pt states that the Nitroglycerin did not change the pain. C/o pain more with movement.

## 2011-10-28 DIAGNOSIS — R072 Precordial pain: Secondary | ICD-10-CM | POA: Diagnosis not present

## 2012-01-08 DIAGNOSIS — H04129 Dry eye syndrome of unspecified lacrimal gland: Secondary | ICD-10-CM | POA: Diagnosis not present

## 2012-01-08 DIAGNOSIS — H1010 Acute atopic conjunctivitis, unspecified eye: Secondary | ICD-10-CM | POA: Diagnosis not present

## 2012-01-20 DIAGNOSIS — J019 Acute sinusitis, unspecified: Secondary | ICD-10-CM | POA: Diagnosis not present

## 2012-03-11 ENCOUNTER — Other Ambulatory Visit: Payer: Self-pay | Admitting: Family Medicine

## 2012-03-11 DIAGNOSIS — Z139 Encounter for screening, unspecified: Secondary | ICD-10-CM

## 2012-03-16 DIAGNOSIS — Z79899 Other long term (current) drug therapy: Secondary | ICD-10-CM | POA: Diagnosis not present

## 2012-03-16 DIAGNOSIS — E782 Mixed hyperlipidemia: Secondary | ICD-10-CM | POA: Diagnosis not present

## 2012-03-22 DIAGNOSIS — L570 Actinic keratosis: Secondary | ICD-10-CM | POA: Diagnosis not present

## 2012-03-22 DIAGNOSIS — I10 Essential (primary) hypertension: Secondary | ICD-10-CM | POA: Diagnosis not present

## 2012-03-22 DIAGNOSIS — L819 Disorder of pigmentation, unspecified: Secondary | ICD-10-CM | POA: Diagnosis not present

## 2012-03-22 DIAGNOSIS — B009 Herpesviral infection, unspecified: Secondary | ICD-10-CM | POA: Diagnosis not present

## 2012-03-22 DIAGNOSIS — K219 Gastro-esophageal reflux disease without esophagitis: Secondary | ICD-10-CM | POA: Diagnosis not present

## 2012-03-22 DIAGNOSIS — E785 Hyperlipidemia, unspecified: Secondary | ICD-10-CM | POA: Diagnosis not present

## 2012-04-06 ENCOUNTER — Ambulatory Visit (HOSPITAL_COMMUNITY)
Admission: RE | Admit: 2012-04-06 | Discharge: 2012-04-06 | Disposition: A | Discharge status: Home / Self Care | Payer: Medicare Other | Source: Ambulatory Visit | Attending: Family Medicine | Admitting: Family Medicine

## 2012-04-06 DIAGNOSIS — Z1231 Encounter for screening mammogram for malignant neoplasm of breast: Secondary | ICD-10-CM | POA: Diagnosis not present

## 2012-04-06 DIAGNOSIS — Z139 Encounter for screening, unspecified: Secondary | ICD-10-CM

## 2012-04-14 DIAGNOSIS — D485 Neoplasm of uncertain behavior of skin: Secondary | ICD-10-CM | POA: Diagnosis not present

## 2012-04-14 DIAGNOSIS — L570 Actinic keratosis: Secondary | ICD-10-CM | POA: Diagnosis not present

## 2012-04-14 DIAGNOSIS — L905 Scar conditions and fibrosis of skin: Secondary | ICD-10-CM | POA: Diagnosis not present

## 2012-06-07 ENCOUNTER — Other Ambulatory Visit: Payer: Self-pay | Admitting: Endodontics

## 2012-06-07 DIAGNOSIS — K045 Chronic apical periodontitis: Secondary | ICD-10-CM | POA: Diagnosis not present

## 2012-06-23 DIAGNOSIS — G5600 Carpal tunnel syndrome, unspecified upper limb: Secondary | ICD-10-CM | POA: Diagnosis not present

## 2012-06-23 DIAGNOSIS — J309 Allergic rhinitis, unspecified: Secondary | ICD-10-CM | POA: Diagnosis not present

## 2012-06-23 DIAGNOSIS — I10 Essential (primary) hypertension: Secondary | ICD-10-CM | POA: Diagnosis not present

## 2012-06-23 DIAGNOSIS — Z23 Encounter for immunization: Secondary | ICD-10-CM | POA: Diagnosis not present

## 2012-07-21 DIAGNOSIS — M79609 Pain in unspecified limb: Secondary | ICD-10-CM | POA: Diagnosis not present

## 2012-08-18 DIAGNOSIS — Z79899 Other long term (current) drug therapy: Secondary | ICD-10-CM | POA: Diagnosis not present

## 2012-08-18 DIAGNOSIS — E782 Mixed hyperlipidemia: Secondary | ICD-10-CM | POA: Diagnosis not present

## 2012-08-24 DIAGNOSIS — M545 Low back pain: Secondary | ICD-10-CM | POA: Diagnosis not present

## 2012-08-24 DIAGNOSIS — J310 Chronic rhinitis: Secondary | ICD-10-CM | POA: Diagnosis not present

## 2012-08-24 DIAGNOSIS — M76899 Other specified enthesopathies of unspecified lower limb, excluding foot: Secondary | ICD-10-CM | POA: Diagnosis not present

## 2012-08-24 DIAGNOSIS — R300 Dysuria: Secondary | ICD-10-CM | POA: Diagnosis not present

## 2012-08-24 DIAGNOSIS — E785 Hyperlipidemia, unspecified: Secondary | ICD-10-CM | POA: Diagnosis not present

## 2012-08-31 DIAGNOSIS — Z01419 Encounter for gynecological examination (general) (routine) without abnormal findings: Secondary | ICD-10-CM | POA: Diagnosis not present

## 2012-08-31 DIAGNOSIS — N952 Postmenopausal atrophic vaginitis: Secondary | ICD-10-CM | POA: Diagnosis not present

## 2012-11-22 DIAGNOSIS — R1084 Generalized abdominal pain: Secondary | ICD-10-CM | POA: Diagnosis not present

## 2012-11-22 DIAGNOSIS — M129 Arthropathy, unspecified: Secondary | ICD-10-CM | POA: Diagnosis not present

## 2012-11-22 DIAGNOSIS — I10 Essential (primary) hypertension: Secondary | ICD-10-CM | POA: Diagnosis not present

## 2012-11-22 DIAGNOSIS — K219 Gastro-esophageal reflux disease without esophagitis: Secondary | ICD-10-CM | POA: Diagnosis not present

## 2012-12-14 ENCOUNTER — Ambulatory Visit (INDEPENDENT_AMBULATORY_CARE_PROVIDER_SITE_OTHER): Payer: Medicare Other

## 2012-12-14 ENCOUNTER — Encounter: Payer: Self-pay | Admitting: Orthopedic Surgery

## 2012-12-14 ENCOUNTER — Ambulatory Visit (INDEPENDENT_AMBULATORY_CARE_PROVIDER_SITE_OTHER): Payer: Medicare Other | Admitting: Orthopedic Surgery

## 2012-12-14 VITALS — BP 130/78 | Ht 65.0 in | Wt 175.0 lb

## 2012-12-14 DIAGNOSIS — M549 Dorsalgia, unspecified: Secondary | ICD-10-CM

## 2012-12-14 DIAGNOSIS — M25551 Pain in right hip: Secondary | ICD-10-CM

## 2012-12-14 DIAGNOSIS — M25552 Pain in left hip: Secondary | ICD-10-CM

## 2012-12-14 DIAGNOSIS — M25559 Pain in unspecified hip: Secondary | ICD-10-CM

## 2012-12-14 DIAGNOSIS — M7070 Other bursitis of hip, unspecified hip: Secondary | ICD-10-CM

## 2012-12-14 DIAGNOSIS — M76899 Other specified enthesopathies of unspecified lower limb, excluding foot: Secondary | ICD-10-CM

## 2012-12-14 NOTE — Patient Instructions (Addendum)
You have received a steroid shot. 15% of patients experience increased pain at the injection site with in the next 24 hours. This is best treated with ice and tylenol extra strength 2 tabs every 8 hours. If you are still having pain please call the office.    Call hospital to arrange PT    Chronic Back Pain  When back pain lasts longer than 3 months, it is called chronic back pain.People with chronic back pain often go through certain periods that are more intense (flare-ups).  CAUSES Chronic back pain can be caused by wear and tear (degeneration) on different structures in your back. These structures include:  The bones of your spine (vertebrae) and the joints surrounding your spinal cord and nerve roots (facets).  The strong, fibrous tissues that connect your vertebrae (ligaments). Degeneration of these structures may result in pressure on your nerves. This can lead to constant pain. HOME CARE INSTRUCTIONS  Avoid bending, heavy lifting, prolonged sitting, and activities which make the problem worse.  Take brief periods of rest throughout the day to reduce your pain. Lying down or standing usually is better than sitting while you are resting.  Take over-the-counter or prescription medicines only as directed by your caregiver. SEEK IMMEDIATE MEDICAL CARE IF:   You have weakness or numbness in one of your legs or feet.  You have trouble controlling your bladder or bowels.  You have nausea, vomiting, abdominal pain, shortness of breath, or fainting. Document Released: 10/16/2004 Document Revised: 12/01/2011 Document Reviewed: 08/23/2011 [facility] Patient Information 2013 [place], [place].

## 2012-12-14 NOTE — Progress Notes (Signed)
Patient ID: Allison Freeman, female   DOB: February 29, 1946, 67 y.o.   MRN: [id number] Chief Complaint  Patient presents with  . Hip Pain    Bilateral Hip and back pain Referred by Lilyan Punt    History of present illness: This is a 67 year old female with a history of 13 years of back pain with arthritis previously treated with maybe 1 course of physical therapy and treated with chiropractic care who presents now with bilateral hip pain and chronic back pain. The pain has been present in the greater trochanter regions of both hips for 3-6 months. The pain is gradual in onset throbbing intermittent and worse when she sleeps on her side. This is especially difficult for her because she can't sleep on her back secondary to back pain  She denies radicular symptoms she has noted some pain in the upper parts of her thighs but denies numbness tingling or radicular pain  Review of systems she complains of watering of her eyes and heartburn occasional frequency along with the joint pain muscle pain and redness of her skin excessive urination and seasonal allergies she denies chest pain shortness of breath numbness tingling unsteady gait nervousness anxiety or depression denies easy bleeding or bruising  She has no allergies  She reports osteopenia and arthritis his medical problems  She reports tonsillectomy, partial hysterectomy followed by complete hysterectomy in 2001, knee surgery arthroscopic in 2004, repair of rectocele and cystocele in 2010  Pharmacy [facility]  Primary care physician Lubertha South  Medications he total lack 40 mg twice a day, lisinopril 10 mg daily, alprazolam 0.5 mg bedtime, pravastatin 40 mg daily, pantoprazole 40 mg. Tizanidine 4 mg Other medications include Osteo Bi-Flex, fissural, baby aspirin, and ultimately vitamin.  Family history arthritis and cancer  Social history married and retired with the 12th grade high school education  BP 130/78  Ht 5\' 5"  (1.651  m)  Wt 175 lb (79.379 kg)  BMI 29.12 kg/m2  General appearance is normal, the patient is alert and oriented x3 with normal mood and affect. Gait and station present no abnormalities. She has good ambulation  Spinal exam shows tenderness in the lower back and also possibility of lipoma in the left lower back. She has excellent spinal flexion and extension without pain. Skin is intact. Muscle tone is normal without spasm  Lower extremities show tenderness over both greater trochanters painful adduction of both hips normal internal and external rotation without pain in the groin skin intact muscle tone normal both hips are stable. Distal pulses are intact normal reflexes. Normal sensation in both lower extremities.  Thoracic spine on inspection was normal. No range of motion deficits no palpable step-offs to suggest instability muscle tone normal skin intact  I ordered a pelvic film 2 to check the hips for arthritis and a lumbar spine film to evaluate the lumbar spine  Recommend bilateral injections for bursitis of both hips and physical therapy for the lumbar spine  Procedure note  diagnosis bilateral bursitis of the hip Medications Depo-Medrol 40 mg Medication #2 lidocaine 1% 4 cc  Timeout  Verbal consent  Details: The area over the left hip was prepped with alcohol and anesthetized with ethyl chloride and then a 22-gauge spinal needle was advanced to the greater trochanter slightly withdrawn and the medication was injected  This was then repeated over the right hip  There were no complications.

## 2012-12-20 ENCOUNTER — Ambulatory Visit (HOSPITAL_COMMUNITY)
Admission: RE | Admit: 2012-12-20 | Discharge: 2012-12-20 | Disposition: A | Discharge status: Home / Self Care | Payer: Medicare Other | Source: Ambulatory Visit | Attending: Orthopedic Surgery | Admitting: Orthopedic Surgery

## 2012-12-20 DIAGNOSIS — I10 Essential (primary) hypertension: Secondary | ICD-10-CM | POA: Insufficient documentation

## 2012-12-20 DIAGNOSIS — R262 Difficulty in walking, not elsewhere classified: Secondary | ICD-10-CM | POA: Insufficient documentation

## 2012-12-20 DIAGNOSIS — R29898 Other symptoms and signs involving the musculoskeletal system: Secondary | ICD-10-CM | POA: Insufficient documentation

## 2012-12-20 DIAGNOSIS — M545 Low back pain, unspecified: Secondary | ICD-10-CM | POA: Insufficient documentation

## 2012-12-20 DIAGNOSIS — IMO0001 Reserved for inherently not codable concepts without codable children: Secondary | ICD-10-CM | POA: Insufficient documentation

## 2012-12-20 NOTE — Evaluation (Signed)
Physical Therapy Evaluation  Patient Details  Name: Allison Freeman MRN: [id number] Date of Birth: 09-Dec-1945  Today's Date: 12/20/2012 Time: 4098-1191 PT Time Calculation (min): 47 min              Visit#: 1 of 8  Re-eval: 01/19/13 Assessment Diagnosis: lumbar pain Prior Therapy: years ago Charge: eval Authorization: [place] [employer] administrator Visit#: 1 of 8   Past Medical History:  Past Medical History  Diagnosis Date  . Acid reflux   . Hypertension   . Hypercholesterolemia   . Arthritis    Past Surgical History:  Past Surgical History  Procedure Laterality Date  . Abdominal hysterectomy    . Tonsillectomy    . Rectal surgery    . Bladder surgery      Subjective Symptoms/Limitations Symptoms: Allison Freeman states that she has had chronic intermittent back pain for over ten years.  In the past several months the pain has become more severe and more frequent  in nature radiating to B greater trochanters.  The patient states that she had shots in her B hips at the MD's office last week and the hip pain has gone away.  She states that she is still having back pain.   The pain  is not greater on the right side or the left side; rather it varies depending on the day.    Presently she is not completing any exercises for her back.  She is taking medication for OA which gives her less than 20% relief.  She is now being referred to therapy to educate pt in exercises and body mechanics. How long can you sit comfortably?: 30 minutes to an hour. How long can you stand comfortably?: Standing almost immediately causes pain.  Unable to iron comfortably. How long can you walk comfortably?: She is able walk for 30 minutes without increased pain.   Patient Stated Goals: Any lifting or bending aggrevates her pain.   Pain Assessment Currently in Pain?: No/denies (Worst pain in her back is a 5/10; best 0/10) Pain Location: Back Pain Orientation: Right;Left;Lower Pain  Onset: More than a month ago Pain Frequency: Intermittent   Prior Functioning  Prior Function Leisure: Hobbies-yes (Comment) Comments: gardening, playing with grandchildren.   Sensation/Coordination/Flexibility/Functional Tests Functional Tests Functional Tests: LEFS 18  Assessment RLE Strength Right Hip Flexion: 5/5 Right Hip Extension: 3/5 Right Hip ABduction:  (4+/5) Right Knee Flexion: 3+/5 Right Knee Extension: 5/5 Right Ankle Dorsiflexion: 4/5 LLE Strength Left Hip Flexion:  (5-/5) Left Hip Extension: 3+/5 Left Hip ABduction:  (4+/5) Left Knee Flexion: 3+/5 Left Knee Extension: 5/5 Left Ankle Dorsiflexion:  (4+/5) Lumbar AROM Lumbar Flexion: wnl with reps improving. Lumbar Extension: wnl with reps increasing pain. Lumbar - Right Side Bend: decreased 20 % with no change Lumbar - Left Side Bend: wnl with  Lumbar - Right Rotation: wnl Lumbar - Left Rotation: wnl  Exercise/Treatments Mobility/Balance  Posture/Postural Control Posture/Postural Control: Postural limitations Postural Limitations: increased lordosis    Stretches Single Knee to Chest Stretch: 3 reps;20 seconds Pelvic Tilt: 5 reps Piriformis Stretch: 3 reps;20 seconds Supine Ab Set: 5 reps  Physical Therapy Assessment and Plan PT Assessment and Plan Clinical Impression Statement: Pt with chronic low back pain who shows preference to flexion based exercises.  Pt has weak core mm as well as B hamstring and hip extension weakness.  PT will benefit from skilled physical therapy to educate in strengthening exercises and body mechanics to decrese back pain to  allow pt to complete her normal ADLwithout painl Pt will benefit from skilled therapeutic intervention in order to improve on the following deficits: Decreased activity tolerance;Decreased strength;Difficulty walking;Improper body mechanics Rehab Potential: Good PT Frequency: Min 2X/week PT Duration: 4 weeks PT Treatment/Interventions: Functional  mobility training;Therapeutic activities;Therapeutic exercise;Patient/family education;Manual techniques;Modalities PT Plan: educate in body mechanics; begin stabilization program     Goals Home Exercise Program Pt will Perform Home Exercise Program: Independently PT Short Term Goals Time to Complete Short Term Goals: 2 weeks PT Short Term Goal 1: pain level no greater than a 3/10 80% of the day PT Short Term Goal 2: Pt to be able to stand for 15 minutes without increased pain to be able to make a small meal or iron. PT Short Term Goal 3: Pt to be able to sit for 45 minutes with comfort to be able to go out to eat  PT Short Term Goal 4: Pt to be able to verbalize the importance of proper body mechanics in low back pain. PT Long Term Goals Time to Complete Long Term Goals: 4 weeks PT Long Term Goal 1: I in advance HEP PT Long Term Goal 2: Pain no greater than a 1/10 80% of the day Long Term Goal 3: Pt to be able to stand for 30 minutes in order to make a normal meal without pain  Long Term Goal 4: Pt to be able to sit for 2 hours without increased pain to be able to see a movie or travel PT Long Term Goal 5: Pt to be able to complete housework without increased pain Additional PT Long Term Goals?: Yes PT Long Term Goal 6: Pt to have begun walking for exercise for a healthy life style change.  Problem List Patient Active Problem List  Diagnosis  . Hip bursitis  . Back pain  . Hip pain, bilateral  . Difficulty in walking  . Bilateral leg weakness    General Behavior During Session: [facility] for tasks performed Cognition: [facility] for tasks performed PT Plan of Care PT Home Exercise Plan: given PT Patient Instructions: explained the importance of good body mechanics. Consulted and Agree with Plan of Care: Patient  GP Functional Assessment Tool Used: oswestry Functional Limitation: Mobility: Walking and moving around Mobility: Walking and Moving Around Current Status [phone]): At least 20  percent but less than 40 percent impaired, limited or restricted Mobility: Walking and Moving Around Goal Status [phone]): At least 1 percent but less than 20 percent impaired, limited or restricted  RUSSELL,CINDY 12/20/2012, 10:47 AM  Physician Documentation Your signature is required to indicate approval of the treatment plan as stated above.  Please sign and either send electronically or make a copy of this report for your files and return this physician signed original.   Please mark one 1.__approve of plan  2. ___approve of plan with the following conditions.   ______________________________                                                          _____________________ Physician Signature  Date  

## 2012-12-22 ENCOUNTER — Ambulatory Visit (HOSPITAL_COMMUNITY)
Admission: RE | Admit: 2012-12-22 | Discharge: 2012-12-22 | Disposition: A | Discharge status: Home / Self Care | Payer: Medicare Other | Source: Ambulatory Visit | Attending: Orthopedic Surgery | Admitting: Orthopedic Surgery

## 2012-12-22 DIAGNOSIS — IMO0001 Reserved for inherently not codable concepts without codable children: Secondary | ICD-10-CM | POA: Insufficient documentation

## 2012-12-22 DIAGNOSIS — M545 Low back pain, unspecified: Secondary | ICD-10-CM | POA: Insufficient documentation

## 2012-12-22 DIAGNOSIS — I10 Essential (primary) hypertension: Secondary | ICD-10-CM | POA: Insufficient documentation

## 2012-12-22 NOTE — Progress Notes (Signed)
Physical Therapy Treatment Patient Details  Name: Allison Freeman MRN: [id number] Date of Birth: January 13, 1946  Today's Date: 12/22/2012 Time: 9629-5284 PT Time Calculation (min): 45 min  Visit#: 2 of 8  Re-eval: 01/19/13 Charges: Therex x 30' MHP x 10'  Authorization: [employer] engineer Visit#: 2 of 8   Subjective: Symptoms/Limitations Symptoms: Pt reports HEP compliance. Pain Assessment Currently in Pain?: Yes Pain Score:   3 Pain Location: Back Pain Orientation: Right;Left;Lower   Exercise/Treatments Stretches Single Knee to Chest Stretch: 2 reps;30 seconds Piriformis Stretch: 3 reps;30 seconds Supine Ab Set: 10 reps Bent Knee Raise: 10 reps Bridge: 10 reps Straight Leg Raise: 10 reps Sidelying Clam: 10 reps;5 seconds Hip Abduction: 10 reps Prone  Straight Leg Raise: 10 reps  Modalities Modalities: Moist Heat Moist Heat Therapy Number Minutes Moist Heat: 10 Minutes Moist Heat Location: Other (comment) (Lumbar)  Physical Therapy Assessment and Plan PT Assessment and Plan Clinical Impression Statement: Pt progressed stabilization exercises with minimal difficulty after initial cueing for technique. Pt completes stretches well with minimal need for cueing. Pt has most difficulty with prone SLR secondary to gluteal weakness. Ended session with MHP to lumbar to decrease pain and tightness. Pt reports pain decrease to 0/10 at end of session.  PT Plan: Continue to progress stabilization exercises and improve body mechanics per PT POC.     Problem List Patient Active Problem List  Diagnosis  . Hip bursitis  . Back pain  . Hip pain, bilateral  . Difficulty in walking  . Bilateral leg weakness    PT - End of Session Activity Tolerance: Patient tolerated treatment well General Behavior During Session: [facility] for tasks performed Cognition: [facility] for tasks performed  Seth Bake, PTA  12/22/2012, 9:41 AM

## 2012-12-27 ENCOUNTER — Ambulatory Visit (HOSPITAL_COMMUNITY)
Admission: RE | Admit: 2012-12-27 | Discharge: 2012-12-27 | Disposition: A | Discharge status: Home / Self Care | Payer: Medicare Other | Source: Ambulatory Visit | Attending: Orthopedic Surgery | Admitting: Orthopedic Surgery

## 2012-12-27 ENCOUNTER — Encounter: Payer: Self-pay | Admitting: *Deleted

## 2012-12-27 DIAGNOSIS — R29898 Other symptoms and signs involving the musculoskeletal system: Secondary | ICD-10-CM

## 2012-12-27 NOTE — Progress Notes (Signed)
Physical Therapy Treatment Patient Details  Name: Allison Freeman MRN: [id number] Date of Birth: 17-Feb-1946  Today's Date: 12/27/2012 Time: 1028-1108 PT Time Calculation (min): 40 min  Visit#: 3 of 8  Re-eval: 01/19/13   Charge:  There ex x 38 Authorization: [employer]  Authorization Time Period:    Authorization Visit#: 3 of 8   Subjective:  Pt states that she has no pain today.  Feeling better with the exercises.     Exercise/Treatments     Stretches Piriformis Stretch: 1 rep;60 seconds;Limitations Piriformis Stretch Limitations: all 4/s  Supine Dead Bug: 10 reps Bridge: 15 reps Straight Leg Raise: 10 reps Sidelying Clam: 5 reps Hip Abduction: 10 reps Prone  Single Arm Raise: 10 reps Straight Leg Raise: 10 reps Opposite Arm/Leg Raise: 10 reps Other Prone Lumbar Exercises: heelsqueeze x 10    Physical Therapy Assessment and Plan PT Assessment and Plan Clinical Impression Statement: Added new prone exercises with manual and tactile cuing for proper technique.  PT has significantly increased difficulty doing exercises on her right side compared to her left.  Therapist stressed the importance of patient completing the exericses with good technique.   PT Plan: begin standing postural t-band exercises; begin functinal squat educating pt to use this manuever when getting items out of her crisper, trunk and brushig her teeth.    Goals Home Exercise Program PT Goal: Perform Home Exercise Program - Progress: Met PT Short Term Goals PT Short Term Goal 1 - Progress: Met PT Short Term Goal 2 - Progress: Progressing toward goal PT Short Term Goal 3 - Progress: Progressing toward goal PT Short Term Goal 4 - Progress: Progressing toward goal PT Long Term Goals PT Long Term Goal 1: I in advance HEP PT Long Term Goal 1 - Progress: Progressing toward goal PT Long Term Goal 2: Pain no greater than a 1/10 80% of the day PT Long Term Goal 2 - Progress: Progressing  toward goal Long Term Goal 3: Pt to be able to stand for 30 minutes in order to make a normal meal without pain  Long Term Goal 3 Progress: Progressing toward goal Long Term Goal 4: Pt to be able to sit for 2 hours without increased pain to be able to see a movie or travel Long Term Goal 4 Progress: Progressing toward goal PT Long Term Goal 5: Pt to be able to complete housework without increased pain Long Term Goal 5 Progress: Progressing toward goal PT Long Term Goal 6: Pt to have begun walking for exercise for a healthy life style change.  Problem List Patient Active Problem List  Diagnosis  . Hip bursitis  . Back pain  . Hip pain, bilateral  . Difficulty in walking  . Bilateral leg weakness       GP    RUSSELL,CINDY 12/27/2012, 12:06 PM

## 2012-12-29 ENCOUNTER — Ambulatory Visit (HOSPITAL_COMMUNITY)
Admission: RE | Admit: 2012-12-29 | Discharge: 2012-12-29 | Disposition: A | Discharge status: Home / Self Care | Payer: Medicare Other | Source: Ambulatory Visit | Attending: Orthopedic Surgery | Admitting: Orthopedic Surgery

## 2012-12-29 ENCOUNTER — Encounter: Payer: Self-pay | Admitting: Family Medicine

## 2012-12-29 ENCOUNTER — Ambulatory Visit (INDEPENDENT_AMBULATORY_CARE_PROVIDER_SITE_OTHER): Payer: Medicare Other | Admitting: Family Medicine

## 2012-12-29 VITALS — BP 120/72 | HR 80 | Wt 173.0 lb

## 2012-12-29 DIAGNOSIS — K6289 Other specified diseases of anus and rectum: Secondary | ICD-10-CM

## 2012-12-29 MED ORDER — HYDROCORTISONE ACE-PRAMOXINE 1-1 % RE FOAM: 1.0000 | Freq: Three times a day (TID) | RECTAL | Status: DC

## 2012-12-29 NOTE — Progress Notes (Signed)
  Subjective:    Patient ID: Allison Freeman, female    DOB: 11-24-1945, 67 y.o.   MRN: [id number]  HPI  Positive rectal pain. Burning and hurting. Hurts to sit. Very little bleeding. No recent constipation. Hurts with any pressure. No fever or chills.  Didn't try meds.  Review of Systems    ROS otherwise negative Objective:   Physical Exam  Alert no acute distress. Lungs clear heart regular in rhythm. Abdomen benign. Perirectal irritation and irritated hemorrhoids. Tiny fissure noted.      Assessment & Plan:  Impression flare of hemorrhoids with rectal fissure. Plan Proctofoam HC 3 times a day to affected area. Symptomatic care discussed. WSL

## 2012-12-29 NOTE — Progress Notes (Signed)
Physical Therapy Treatment Patient Details  Name: Allison Freeman MRN: [id number] Date of Birth: 08-30-1946  Today's Date: 12/29/2012 Time: 0865-7846 PT Time Calculation (min): 37 min  Visit#: 4 of 8  Re-eval: 01/19/13 Charges: Therex x 28' Self care x 8'   Authorization: [employer]   Authorization Visit#: 4 of 8   Subjective: Symptoms/Limitations Symptoms: Pt is pain free other than soreness from exercises. Pt states that exercises seem to be decreasing pain.  Pain Assessment Currently in Pain?: No/denies  Precautions/Restrictions     Exercise/Treatments Stretches Piriformis Stretch: 1 rep;60 seconds;Limitations Piriformis Stretch Limitations: quadruped Standing Scapular Retraction: 10 reps;Theraband Theraband Level (Scapular Retraction): Level 3 (Green) Row: 10 reps;Theraband Theraband Level (Row): Level 3 (Green) Shoulder Extension: 10 reps;Theraband Theraband Level (Shoulder Extension): Level 3 (Green) Seated Other Seated Lumbar Exercises: Hip roll in/out 10x5" Supine Dead Bug: 10 reps Bridge: 15 reps Straight Leg Raise: 10 reps Sidelying Clam: 10 reps;5 seconds Hip Abduction: 10 reps Prone  Opposite Arm/Leg Raise: 10 reps Other Prone Lumbar Exercises: heelsqueeze 10x5"   Physical Therapy Assessment and Plan PT Assessment and Plan Clinical Impression Statement: Began fucntional squats and scapular tband exercises with multimodal cueing for form. Pt also requires multimodal cueing to avoid hamsting contraction with sidelying clams. Pt is without complaint throughtou session. HEP given for clams and hip roll in/out.  PT Plan: Continue to progress core and lower extremity strength per PT POC.      Problem List Patient Active Problem List  Diagnosis  . Hip bursitis  . Back pain  . Hip pain, bilateral  . Difficulty in walking  . Bilateral leg weakness    PT Plan of Care PT Home Exercise Plan: Given  Seth Bake, PTA 12/29/2012,  10:16 AM

## 2013-01-03 ENCOUNTER — Ambulatory Visit (HOSPITAL_COMMUNITY)
Admission: RE | Admit: 2013-01-03 | Discharge: 2013-01-03 | Disposition: A | Discharge status: Home / Self Care | Payer: Medicare Other | Source: Ambulatory Visit | Attending: Orthopedic Surgery | Admitting: Orthopedic Surgery

## 2013-01-03 NOTE — Progress Notes (Signed)
Physical Therapy Treatment Patient Details  Name: Allison Freeman MRN: [id number] Date of Birth: Dec 18, 1945  Today's Date: 01/03/2013 Time: 1020-1100 PT Time Calculation (min): 40 min  Visit#: 5 of 8  Re-eval: 01/19/13 Charges: Therex x 38'  Authorization: [employer] Visit#: 5 of 8   Subjective: Symptoms/Limitations Symptoms: Pt reprots continued HEP compliance. Pain Assessment Currently in Pain?: Yes Pain Score:   2 Pain Location: Back Pain Orientation: Mid;Lower   Exercise/Treatments Stretches Piriformis Stretch: 1 rep;60 seconds;Limitations Piriformis Stretch Limitations: quadruped Standing Lifting: 10 reps;Limitations Lifting Limitations: From 8" to waist yellow ball Scapular Retraction: 10 reps;Theraband Theraband Level (Scapular Retraction): Level 3 (Green) Row: 10 reps;Theraband Theraband Level (Row): Level 3 (Green) Shoulder Extension: 10 reps;Theraband Theraband Level (Shoulder Extension): Level 3 (Green) Other Standing Lumbar Exercises: Golfer's lift (education and pt demo) Seated Other Seated Lumbar Exercises: Hip roll in/out 10x5" Supine Dead Bug: 10 reps Bridge: 15 reps Straight Leg Raise: 10 reps Large Ball Abdominal Isometric: 10 reps;5 seconds Large Ball Oblique Isometric: 10 reps;5 seconds Sidelying Clam: 10 reps;5 seconds Hip Abduction: 15 reps Prone  Opposite Arm/Leg Raise: 10 reps Other Prone Lumbar Exercises: heelsqueeze 10x5"  Physical Therapy Assessment and Plan PT Assessment and Plan Clinical Impression Statement: Pt is progressing well. Progressed core strengthening with minimal difficulty after initial cueing for technique. Pt reports decreased pain after piriformis stretch. Pt educated on proper lifting techniques and the importance of protecting spine. Pt reports pain decrease to 0/10 at end of session. PT Plan: Continue to progress core and lower extremity strength per PT POC.      Problem  List Patient Active Problem List  Diagnosis  . Hip bursitis  . Back pain  . Hip pain, bilateral  . Difficulty in walking  . Bilateral leg weakness    PT - End of Session Activity Tolerance: Patient tolerated treatment well General Behavior During Session: [facility] for tasks performed Cognition: [facility] for tasks performed  Seth Bake, PTA 01/03/2013, 12:26 PM

## 2013-01-05 ENCOUNTER — Ambulatory Visit (HOSPITAL_COMMUNITY)
Admission: RE | Admit: 2013-01-05 | Discharge: 2013-01-05 | Disposition: A | Discharge status: Home / Self Care | Payer: Medicare Other | Source: Ambulatory Visit | Attending: Orthopedic Surgery | Admitting: Orthopedic Surgery

## 2013-01-05 NOTE — Progress Notes (Signed)
Physical Therapy Treatment Patient Details  Name: Allison Freeman MRN: [id number] Date of Birth: 12/21/1945  Today's Date: 01/05/2013 Time: 0937-1015 PT Time Calculation (min): 38 min Visit#: 6 of 8  Re-eval: 01/19/13 Authorization: [place] [employer] medicare  Authorization Visit#: 6 of 8  Charges:  therex 38'  Subjective: Symptoms/Limitations Symptoms: Pt. states she is not hurting today.  Progressing well and compliant with HEP. Pain Assessment Currently in Pain?: No/denies Pain Score: 0-No pain   Exercise/Treatments Stretches Piriformis Stretch: 60 seconds;Limitations;2 reps Piriformis Stretch Limitations: quadruped Standing Lifting: 10 reps;Limitations Lifting Limitations: small box; floor to waist Scapular Retraction: 15 reps Theraband Level (Scapular Retraction): Level 3 (Green) Row: 15 reps Theraband Level (Row): Level 3 (Green) Shoulder Extension: 15 reps Theraband Level (Shoulder Extension): Level 3 (Green) Seated Other Seated Lumbar Exercises: Hip roll in/out 10x5" Supine Dead Bug: 10 reps Bridge: 15 reps Straight Leg Raise: 15 reps Large Ball Abdominal Isometric: 10 reps;5 seconds Large Ball Oblique Isometric: 10 reps;5 seconds Sidelying Clam: 10 reps;Other (comment) (10 second holds) Hip Abduction: 15 reps Prone  Opposite Arm/Leg Raise: 10 reps Other Prone Lumbar Exercises: heelsqueeze 10x5"     Physical Therapy Assessment and Plan PT Assessment and Plan Clinical Impression Statement: Pt. continues to require cues with therex and with form/stability.  Pt required tactile/vcs with box lifting for widening BOS and keeping load close to body.  OVerall improving symptoms. PT Plan: Continue to progress core and lower extremity strength per PT POC.      Problem List Patient Active Problem List  Diagnosis  . Hip bursitis  . Back pain  . Hip pain, bilateral  . Difficulty in walking  . Bilateral leg weakness    PT - End of Session Activity  Tolerance: Patient tolerated treatment well General Cognition: WFL for tasks performed   Lurena Nida, PTA/CLT 01/05/2013, 10:16 AM

## 2013-01-10 ENCOUNTER — Ambulatory Visit (HOSPITAL_COMMUNITY)
Admission: RE | Admit: 2013-01-10 | Discharge: 2013-01-10 | Disposition: A | Discharge status: Home / Self Care | Payer: Medicare Other | Source: Ambulatory Visit | Attending: Orthopedic Surgery | Admitting: Orthopedic Surgery

## 2013-01-10 NOTE — Progress Notes (Signed)
Physical Therapy Treatment Patient Details  Name: Allison Freeman MRN: [id number] Date of Birth: [birth year]  Today's Date: 01/10/2013 Time: 0932-1013 PT Time Calculation (min): 41 min Visit#: 7 of 8  Re-eval: 01/19/13 Charges:  therex 39' Authorization: [employer] medic[employer]  Authorization Visit#: 7 of 8   Subjective: Symptoms/Limitations Symptoms: Pt  reports compliance with HEP.  Reports no pain today. Pain Assessment Currently in Pain?: No/denies   Exercise/Treatments Stretches Piriformis Stretch Limitations: HEP Aerobic UBE (Upper Arm Bike): 4' backward Standing Scapular Retraction: 15 reps;Limitations Theraband Level (Scapular Retraction): Level 3 (Green) Scapular Retraction Limitations: HEP Row: 15 reps;Limitations Theraband Level (Row): Level 3 (Green) Row Limitations: HEP Shoulder Extension: 15 reps;Limitations Theraband Level (Shoulder Extension): Level 3 (Green) Shoulder Extension Limitations: HEP Seated Other Seated Lumbar Exercises: Hip roll in/out 10x5" HEP Supine Bridge: 15 reps Straight Leg Raise: 15 reps Large Ball Abdominal Isometric: 15 reps Large Ball Oblique Isometric: 15 reps Sidelying Clam: 15 reps Hip Abduction: 15 reps Prone  Single Arm Raise: 10 reps Opposite Arm/Leg Raise: 10 reps Other Prone Lumbar Exercises: heelsqueeze 10x5"     Physical Therapy Assessment and Plan PT Assessment and Plan Clinical Impression Statement: Improving form and stab with therex.  Pt. given written HEP/theraband for postural 3 exercises.  Able to progress reps today. PT Plan: Continue to progress core and lower extremity strength per PT POC.  Re-evaluate next session.     Problem List Patient Active Problem List  Diagnosis  . Hip bursitis  . Back pain  . Hip pain, bilateral  . Difficulty in walking  . Bilateral leg weakness    PT - End of Session Activity Tolerance: Patient tolerated treatment well General Behavior During Therapy: WFL  for tasks assessed/performed Cognition: WFL for tasks performed   Lurena Nida, PTA/CLT 01/10/2013, 11:09 AM

## 2013-01-12 ENCOUNTER — Ambulatory Visit (HOSPITAL_COMMUNITY)
Admission: RE | Admit: 2013-01-12 | Discharge: 2013-01-12 | Disposition: A | Discharge status: Home / Self Care | Payer: Medicare Other | Source: Ambulatory Visit | Attending: Orthopedic Surgery | Admitting: Orthopedic Surgery

## 2013-01-12 DIAGNOSIS — R29898 Other symptoms and signs involving the musculoskeletal system: Secondary | ICD-10-CM

## 2013-01-12 NOTE — Evaluation (Signed)
Physical Therapy Discharge summary Patient Details  Name: Allison Freeman MRN: [id number] Date of Birth: 06-24-1946  Today's Date: 01/12/2013 Time: 0930-1015 PT Time Calculation (min): 45 min              Visit#: 8 of 8  Re-eval: 01/12/13 Assessment Diagnosis: lumbar pain Prior Therapy: years ago  Authorization: [place] healthcare medicare    Past Medical History:  Past Medical History  Diagnosis Date  . Acid reflux   . Hypertension   . Hypercholesterolemia   . Arthritis   . Depression   . Panic attacks   . COPD (chronic obstructive pulmonary disease)     Mild  . Reflux   . Insomnia   . Osteopenia   . Restless leg syndrome    Past Surgical History:  Past Surgical History  Procedure Laterality Date  . Tonsillectomy    . Rectal surgery    . Bladder surgery    . Abdominal hysterectomy  2001  . Knee surgery  2003  . Dilation and curettage of uterus    . Cystocele repair  2010  . Rectocele repair  2010  . Breast biopsy  1987  . Breast cyst excision  1998    Subjective Symptoms/Limitations Symptoms: Pt states she is doing her exercises.   How long can you sit comfortably?: 60 minutes was 30 minutes . How long can you stand comfortably?: 15-20 minutes was having pain immediate pain. How long can you walk comfortably?: able to walk 30 minutes without pain. Patient Stated Goals: able to lift small weights without increased pain at this time.  Was unable to lift any weight at all.  Pain Assessment Currently in Pain?: No/denies Pain Score:  (highest pain 3/10.) Pain Location: Back    Prior Functioning  Prior Function Leisure: Hobbies-yes (Comment)  Sensation/Coordination/Flexibility/Functional Tests Functional Tests Functional Tests: LEFS 18  Assessment RLE Strength Right Hip Flexion: 5/5 Right Hip Extension:  (4-/5 was 3/5) Right Hip ABduction: 5/5 ( was 4+/5) Right Knee Flexion: 5/5 Right Knee Extension: 5/5 (was 3+ ) Right Ankle Dorsiflexion: 4/5  (was 4/5) LLE Strength Left Hip Flexion: 5/5 (was 5-/5) Left Hip Extension:  (4-/5 was 3+/5) Left Hip ABduction: 5/5 (was 4+/5) Left Knee Flexion: 5/5 (was 3+) Left Knee Extension: 5/5 Left Ankle Dorsiflexion: 5/5 (was 4+/5) Lumbar AROM Lumbar Flexion: wnl with reps improving. Lumbar Extension: wnl with reps increasing pain. Lumbar - Right Side Bend: wnl was decreased 20 % with no change Lumbar - Left Side Bend: wnl with  Lumbar - Right Rotation: wnl Lumbar - Left Rotation: wnl  Exercise/Treatments Mobility/Balance  Posture/Postural Control Posture/Postural Control: Postural limitations Postural Limitations: increased lordosis    Seated Sit to Stand: 10 reps    Physical Therapy Assessment and Plan PT Assessment and Plan Clinical Impression Statement: Pt has progressed well in therapy.  90% of goals are met.  Pt strength wnl except for hip extension.  Pt is ready for discharge. Pt will benefit from skilled therapeutic intervention in order to improve on the following deficits: Decreased activity tolerance;Decreased strength;Difficulty walking;Improper body mechanics PT Plan: Discharge pt.  Importance of walking, correct posture and body mechanics were stressed to patient.    Goals Home Exercise Program PT Goal: Perform Home Exercise Program - Progress: Met PT Short Term Goals PT Short Term Goal 1: pain level no greater than a 3/10 80% of the day PT Short Term Goal 1 - Progress: Met PT Short Term Goal 2: Pt to be able to stand  for 15 minutes without increased pain to be able to make a small meal or iron. PT Short Term Goal 2 - Progress: Met PT Short Term Goal 3: Pt to be able to sit for 45 minutes with comfort to be able to go out to eat  PT Short Term Goal 3 - Progress: Met PT Short Term Goal 4: Pt to be able to verbalize the importance of proper body mechanics in low back pain. PT Long Term Goals PT Long Term Goal 1: I in advance HEP PT Long Term Goal 2: Pain no greater  than a 1/10 80% of the day PT Long Term Goal 2 - Progress: Progressing toward goal Long Term Goal 3: Pt to be able to stand for 30 minutes in order to make a normal meal without pain  Long Term Goal 3 Progress: Met Long Term Goal 4: Pt to be able to sit for 2 hours without increased pain to be able to see a movie or travel Long Term Goal 4 Progress: Met PT Long Term Goal 5: Pt to be able to complete housework without increased pain Long Term Goal 5 Progress: Progressing toward goal PT Long Term Goal 6: Pt to have begun walking for exercise for a healthy life style change. met   Problem List Patient Active Problem List  Diagnosis  . Hip bursitis  . Back pain  . Hip pain, bilateral  . Difficulty in walking  . Bilateral leg weakness    PT - End of Session Activity Tolerance: Patient tolerated treatment well General Behavior During Therapy: WFL for tasks assessed/performed PT Plan of Care PT Home Exercise Plan: new ex sheet given to pt  GP Functional Assessment Tool Used: oswestry Functional Limitation: Mobility: Walking and moving around Mobility: Walking and Moving Around Goal Status [phone]): At least 1 percent but less than 20 percent impaired, limited or restricted Mobility: Walking and Moving Around Discharge Status [phone]): At least 1 percent but less than 20 percent impaired, limited or restricted  RUSSELL,CINDY 01/12/2013, 10:20 AM  Physician Documentation Your signature is required to indicate approval of the treatment plan as stated above.  Please sign and either send electronically or make a copy of this report for your files and return this physician signed original.   Please mark one 1.__approve of plan  2. ___approve of plan with the following conditions.   ______________________________                                                          _____________________ Physician Signature                                                                                                              Date

## 2013-01-27 ENCOUNTER — Other Ambulatory Visit: Payer: Self-pay | Admitting: Family Medicine

## 2013-01-31 ENCOUNTER — Other Ambulatory Visit: Payer: Self-pay | Admitting: Family Medicine

## 2013-03-03 ENCOUNTER — Other Ambulatory Visit: Payer: Self-pay | Admitting: *Deleted

## 2013-03-03 ENCOUNTER — Ambulatory Visit (INDEPENDENT_AMBULATORY_CARE_PROVIDER_SITE_OTHER): Payer: Medicare Other | Admitting: Orthopedic Surgery

## 2013-03-03 ENCOUNTER — Encounter: Payer: Self-pay | Admitting: Orthopedic Surgery

## 2013-03-03 VITALS — BP 130/76 | Ht 65.0 in | Wt 174.0 lb

## 2013-03-03 DIAGNOSIS — IMO0002 Reserved for concepts with insufficient information to code with codable children: Secondary | ICD-10-CM

## 2013-03-03 MED ORDER — PREDNISONE 10 MG PO KIT: 10.0000 mg | PACK | ORAL | Status: DC

## 2013-03-03 NOTE — Progress Notes (Signed)
Patient ID: Allison Freeman, female   DOB: 26-Sep-1945, 67 y.o.   MRN: [id number] Chief Complaint  Patient presents with  . Follow-up    Follow up on back pain.    67 year-old female chronic history of lower back pain for over 13 years also with bilateral hip pain and bursitis. She was sent for physical therapy and had increased leg pain but decreased back pain. She was given a shot of cortisone in both hips for bursitis  She is ambulating no assistive devices she has no bowel or bladder dysfunction at this time no weight loss no fever or chills  BP 130/76  Ht 5\' 5"  (1.651 m)  Wt 174 lb (78.926 kg)  BMI 28.96 kg/m2 General appearance is normal, the patient is alert and oriented x3 with normal mood and affect. Radicular symptoms in the right leg with a positive straight leg raise at 45. Mild weakness in the dorsiflexors of the foot in mild decreased sensation dorsally as well  Recommend MRI of the lumbar spine to prepare the patient for epidural series. Patient will be called for explanation of MRI results and arrange epidural series as an outpatient

## 2013-03-03 NOTE — Patient Instructions (Addendum)
MRI L SPINE  START DOSE PACK

## 2013-03-09 ENCOUNTER — Other Ambulatory Visit: Payer: Self-pay | Admitting: Family Medicine

## 2013-03-09 ENCOUNTER — Telehealth: Payer: Self-pay | Admitting: Radiology

## 2013-03-09 DIAGNOSIS — Z139 Encounter for screening, unspecified: Secondary | ICD-10-CM

## 2013-03-09 NOTE — Telephone Encounter (Signed)
Patient has MRI at [facility] on 03-14-13 at 5:45. Patient has [facility], authorization # [id number] and it expires on 04-21-13. Dr. Romeo Apple will call the patient with her results.

## 2013-03-14 ENCOUNTER — Ambulatory Visit (HOSPITAL_COMMUNITY)
Admission: RE | Admit: 2013-03-14 | Discharge: 2013-03-14 | Disposition: A | Discharge status: Home / Self Care | Payer: Medicare Other | Source: Ambulatory Visit | Attending: Orthopedic Surgery | Admitting: Orthopedic Surgery

## 2013-03-14 DIAGNOSIS — M545 Low back pain, unspecified: Secondary | ICD-10-CM | POA: Insufficient documentation

## 2013-03-14 DIAGNOSIS — M5380 Other specified dorsopathies, site unspecified: Secondary | ICD-10-CM | POA: Insufficient documentation

## 2013-03-14 DIAGNOSIS — M51379 Other intervertebral disc degeneration, lumbosacral region without mention of lumbar back pain or lower extremity pain: Secondary | ICD-10-CM | POA: Insufficient documentation

## 2013-03-14 DIAGNOSIS — M5137 Other intervertebral disc degeneration, lumbosacral region: Secondary | ICD-10-CM | POA: Insufficient documentation

## 2013-03-14 DIAGNOSIS — IMO0002 Reserved for concepts with insufficient information to code with codable children: Secondary | ICD-10-CM

## 2013-03-14 DIAGNOSIS — M7130 Other bursal cyst, unspecified site: Secondary | ICD-10-CM | POA: Insufficient documentation

## 2013-03-15 ENCOUNTER — Encounter: Payer: Self-pay | Admitting: Orthopedic Surgery

## 2013-03-15 NOTE — Progress Notes (Signed)
Patient ID: Allison Freeman, female   DOB: 04-30-1946, 67 y.o.   MRN: [id number] MRI results reviewed with the patient  She is doing well now on anti-inflammatories.  At this point her symptoms do not warrant further intervention if she gets worse she will call us back and we will refer her to a neurosurgeon for neurosurgical consult

## 2013-03-28 ENCOUNTER — Telehealth: Payer: Self-pay | Admitting: Family Medicine

## 2013-03-28 MED ORDER — ROPINIROLE HCL 1 MG PO TABS: 1.0000 mg | ORAL_TABLET | Freq: Every day | ORAL | Status: DC

## 2013-03-28 NOTE — Telephone Encounter (Signed)
Patient needs a handwritten prescription for Ropinirole 1mg  tablet by mouth at bedtime for mail order pharmacy.  Please call patient when ready to pick up.  Thanks

## 2013-03-28 NOTE — Telephone Encounter (Signed)
Rx printed and left up front for patient pick up. Patient notified. 

## 2013-04-05 ENCOUNTER — Other Ambulatory Visit: Payer: Self-pay | Admitting: Family Medicine

## 2013-04-05 NOTE — Telephone Encounter (Signed)
Refill times 3 total, will need OV before more

## 2013-04-05 NOTE — Telephone Encounter (Signed)
Rx called in 

## 2013-04-07 ENCOUNTER — Ambulatory Visit (HOSPITAL_COMMUNITY)
Admission: RE | Admit: 2013-04-07 | Discharge: 2013-04-07 | Disposition: A | Discharge status: Home / Self Care | Payer: Medicare Other | Source: Ambulatory Visit | Attending: Family Medicine | Admitting: Family Medicine

## 2013-04-07 DIAGNOSIS — Z1231 Encounter for screening mammogram for malignant neoplasm of breast: Secondary | ICD-10-CM | POA: Insufficient documentation

## 2013-04-07 DIAGNOSIS — Z139 Encounter for screening, unspecified: Secondary | ICD-10-CM

## 2013-04-13 ENCOUNTER — Ambulatory Visit (INDEPENDENT_AMBULATORY_CARE_PROVIDER_SITE_OTHER): Payer: Medicare Other | Admitting: Family Medicine

## 2013-04-13 ENCOUNTER — Encounter: Payer: Self-pay | Admitting: Family Medicine

## 2013-04-13 VITALS — BP 120/86 | Temp 98.7°F | Wt 172.0 lb

## 2013-04-13 DIAGNOSIS — K219 Gastro-esophageal reflux disease without esophagitis: Secondary | ICD-10-CM

## 2013-04-13 MED ORDER — ONDANSETRON 4 MG PO TBDP: 4.0000 mg | ORAL_TABLET | Freq: Three times a day (TID) | ORAL | Status: DC | PRN

## 2013-04-13 MED ORDER — SUCRALFATE 1 G PO TABS: 1.0000 g | ORAL_TABLET | Freq: Four times a day (QID) | ORAL | Status: DC

## 2013-04-13 NOTE — Progress Notes (Signed)
  Subjective:    Patient ID: Allison Freeman, female    DOB: [birth year], 67 y.o.   MRN: [id number]  Gastrophageal Reflux She complains of abdominal pain, belching, heartburn and nausea. She reports no chest pain or no choking. This is a new problem. The current episode started 1 to 4 weeks ago. The problem occurs frequently. The problem has been gradually worsening. The heartburn duration is more than one hour. The symptoms are aggravated by medications. Associated symptoms include fatigue.      Review of Systems  Constitutional: Positive for fatigue.  Respiratory: Negative for choking.   Cardiovascular: Negative for chest pain.  Gastrointestinal: Positive for heartburn, nausea and abdominal pain.       Objective:   Physical Exam Alert no acute distress. Lungs clear. Heart regular rate and rhythm. HEENT normal. Epigastrium tender to palpation. No rebound or guarding.       Assessment & Plan:  Impression #1 reflux with gastritis. History of H. pylori so therefore blood work not helpful at this time Discussed. Plan add Carafate 1 g a.c. and at bedtime. Use regularly next few weeks. Been stopped. If symptoms reemerge notify us. We would be obligated to further H. pylori workup if persists. WSL

## 2013-04-15 DIAGNOSIS — K219 Gastro-esophageal reflux disease without esophagitis: Secondary | ICD-10-CM | POA: Insufficient documentation

## 2013-05-16 ENCOUNTER — Encounter: Payer: Self-pay | Admitting: Internal Medicine

## 2013-05-16 ENCOUNTER — Encounter: Payer: Self-pay | Admitting: Family Medicine

## 2013-05-16 ENCOUNTER — Ambulatory Visit (INDEPENDENT_AMBULATORY_CARE_PROVIDER_SITE_OTHER): Payer: Medicare Other | Admitting: Family Medicine

## 2013-05-16 VITALS — BP 120/78 | Ht 65.0 in | Wt 174.6 lb

## 2013-05-16 DIAGNOSIS — E785 Hyperlipidemia, unspecified: Secondary | ICD-10-CM

## 2013-05-16 DIAGNOSIS — J309 Allergic rhinitis, unspecified: Secondary | ICD-10-CM | POA: Insufficient documentation

## 2013-05-16 DIAGNOSIS — Z79899 Other long term (current) drug therapy: Secondary | ICD-10-CM

## 2013-05-16 DIAGNOSIS — E782 Mixed hyperlipidemia: Secondary | ICD-10-CM

## 2013-05-16 DIAGNOSIS — K219 Gastro-esophageal reflux disease without esophagitis: Secondary | ICD-10-CM

## 2013-05-16 MED ORDER — FLUTICASONE PROPIONATE 50 MCG/ACT NA SUSP: NASAL | Status: DC

## 2013-05-16 MED ORDER — SUCRALFATE 1 G PO TABS: 1.0000 g | ORAL_TABLET | Freq: Four times a day (QID) | ORAL | Status: DC

## 2013-05-16 MED ORDER — PRAVASTATIN SODIUM 40 MG PO TABS: 40.0000 mg | ORAL_TABLET | Freq: Every day | ORAL | Status: DC

## 2013-05-16 MED ORDER — LISINOPRIL 10 MG PO TABS: 10.0000 mg | ORAL_TABLET | Freq: Every day | ORAL | Status: DC

## 2013-05-16 MED ORDER — ROPINIROLE HCL 1 MG PO TABS: 1.0000 mg | ORAL_TABLET | Freq: Every day | ORAL | Status: DC

## 2013-05-16 MED ORDER — PANTOPRAZOLE SODIUM 40 MG PO TBEC: DELAYED_RELEASE_TABLET | ORAL | Status: DC

## 2013-05-16 NOTE — Progress Notes (Signed)
  Subjective:    Patient ID: Allison Freeman, female    DOB: 1945-10-28, 67 y.o.   MRN: [id number]  HPI  Patient arrives for follow up on reflux. States she really is not seeing much difference on the Protonix and Carafate. Positive history of h pylori.  meds did not help much, has been taking her protonix and the carafate. stilll having sour taste and belching, nost days experince the symptoms Somewhat worse post meals  The patient compliant with lipid medicine. No obvious side effects. Trying to watch her diet. No blood work in the past 6 months.  Patient reports ear pressure. Allergies have been acting up. Uses her hands I histamine faithfully but not her Flonase.  Review of Systems No chest pain no cough no abdominal pain no true dysphasia no change in bowel habits no blood in stool    Objective:   Physical Exam Alert vital signs stable. Lungs clear. Heart regular rate rhythm. Mild epigastric tenderness. HEENT mild nasal congestion.       Assessment & Plan:  Impression 1 allergic rhinitis worsening. #2 reflux significant very bothersome to patient and unrelenting. #3 hyperlipidemia status uncertain. Plan GI consultation. Symptomatic care discussed. Check blood work. Further recommendations based results. Resume Flonase a Foley

## 2013-05-20 LAB — HEPATIC FUNCTION PANEL
ALT: 25 U/L (ref 0–35)
Alkaline Phosphatase: 48 U/L (ref 39–117)
Bilirubin, Direct: 0.1 mg/dL (ref 0.0–0.3)
Indirect Bilirubin: 0.7 mg/dL (ref 0.0–0.9)
Total Protein: 6.4 g/dL (ref 6.0–8.3)

## 2013-05-20 LAB — LIPID PANEL
Cholesterol: 154 mg/dL (ref 0–200)
Triglycerides: 151 mg/dL — ABNORMAL HIGH (ref <150)
VLDL: 30 mg/dL (ref 0–40)

## 2013-05-25 ENCOUNTER — Encounter: Payer: Self-pay | Admitting: Family Medicine

## 2013-05-27 DIAGNOSIS — H35369 Drusen (degenerative) of macula, unspecified eye: Secondary | ICD-10-CM | POA: Diagnosis not present

## 2013-06-07 DIAGNOSIS — L570 Actinic keratosis: Secondary | ICD-10-CM | POA: Diagnosis not present

## 2013-06-07 DIAGNOSIS — L819 Disorder of pigmentation, unspecified: Secondary | ICD-10-CM | POA: Diagnosis not present

## 2013-06-15 ENCOUNTER — Encounter: Payer: Self-pay | Admitting: Internal Medicine

## 2013-06-16 ENCOUNTER — Ambulatory Visit (INDEPENDENT_AMBULATORY_CARE_PROVIDER_SITE_OTHER): Payer: Medicare Other | Admitting: Gastroenterology

## 2013-06-16 ENCOUNTER — Encounter: Payer: Self-pay | Admitting: Gastroenterology

## 2013-06-16 VITALS — BP 141/80 | HR 85 | Temp 98.2°F | Ht 65.5 in | Wt 176.8 lb

## 2013-06-16 DIAGNOSIS — K625 Hemorrhage of anus and rectum: Secondary | ICD-10-CM | POA: Insufficient documentation

## 2013-06-16 DIAGNOSIS — K649 Unspecified hemorrhoids: Secondary | ICD-10-CM | POA: Insufficient documentation

## 2013-06-16 DIAGNOSIS — K219 Gastro-esophageal reflux disease without esophagitis: Secondary | ICD-10-CM | POA: Diagnosis not present

## 2013-06-16 DIAGNOSIS — R1013 Epigastric pain: Secondary | ICD-10-CM | POA: Insufficient documentation

## 2013-06-16 MED ORDER — PEG-KCL-NACL-NASULF-NA ASC-C 100 G PO SOLR: 1.0000 | ORAL | Status: DC

## 2013-06-16 NOTE — Patient Instructions (Addendum)
1. Colonoscopy and upper endoscopy with Dr. Rourk in the near future. Please see separate instructions. 

## 2013-06-16 NOTE — Assessment & Plan Note (Signed)
Intermittent rectal bleeding and hemorrhoid issues. Last colonoscopy 9 years ago. Colonoscopy in near future.  I have discussed the risks, alternatives, benefits with regards to but not limited to the risk of reaction to medication, bleeding, infection, perforation and the patient is agreeable to proceed. Written consent to be obtained.

## 2013-06-16 NOTE — Progress Notes (Signed)
Primary Care Physician:  Harlow Asa, MD  Primary Gastroenterologist:  Roetta Sessions, MD   Chief Complaint  Patient presents with  . Gastrophageal Reflux    HPI:  Allison Freeman is a 66 y.o. female here for further evaluation of refractory GERD/epigastric pain. Chronic GERD for more than 5 years. No prior EGD.   9 years ago in trial for arthritis pill. H.pylori + at that time and was treated.   Several month h/o increased belching. Some epigastric tenderness, heartburn and regurgitation. Doesn't matter what she eats. No dysphagia/odynophagia. Lot of postnasal drip so feels like something in her throat all the time. No NSAIDs for one month, was on Lodine most recently. BM regular on Metamucil. First time with hemorrhoid issues couple of months ago. Little rectal pain, brbpr. No melena. Checked by Dr. Gerda Diss. Proctofoam one prescription and then OTC prn. On protonix for two years. Carafate did not help.    Current Outpatient Prescriptions  Medication Sig Dispense Refill  . albuterol (PROAIR HFA) 108 (90 BASE) MCG/ACT inhaler Inhale 2 puffs into the lungs every 6 (six) hours as needed for wheezing.      Marland Kitchen ALPRAZolam (XANAX) 0.5 MG tablet Take 0.5 mg by mouth at bedtime as needed for sleep.      . calcium carbonate (OS-CAL) 600 MG TABS Take 600 mg by mouth daily.      . fluticasone (FLONASE) 50 MCG/ACT nasal spray INSTILL TWO SPRAYS INTO EACH NOSTRIL ONCE DAILY  48 g  3  . lisinopril (PRINIVIL,ZESTRIL) 10 MG tablet Take 1 tablet (10 mg total) by mouth daily.  90 tablet  3  . magnesium gluconate (MAGONATE) 500 MG tablet Take 500 mg by mouth daily.      . Multiple Vitamin (MULTIVITAMIN) tablet Take 1 tablet by mouth daily.      . Omega-3 Fatty Acids (FISH OIL) 1200 MG CAPS Take 1 capsule by mouth daily.      . pantoprazole (PROTONIX) 40 MG tablet TAKE ONE TABLET EVERY MORNING  90 tablet  3  . pravastatin (PRAVACHOL) 40 MG tablet Take 1 tablet (40 mg total) by mouth daily.  90 tablet  3  .  Psyllium (METAMUCIL PO) Take by mouth 2 (two) times daily.      Marland Kitchen pyridOXINE (VITAMIN B-6) 100 MG tablet Take 100 mg by mouth daily.      Marland Kitchen rOPINIRole (REQUIP) 1 MG tablet Take 1 tablet (1 mg total) by mouth daily.  90 tablet  3  . tiZANidine (ZANAFLEX) 4 MG tablet Take 4 mg by mouth every 6 (six) hours as needed.       No current facility-administered medications for this visit.    Allergies as of 06/16/2013  . (No Known Allergies)    Past Medical History  Diagnosis Date  . Acid reflux   . Hypertension   . Hypercholesterolemia   . Arthritis   . Depression   . Panic attacks   . COPD (chronic obstructive pulmonary disease)     Mild  . Reflux   . Insomnia   . Osteopenia   . Restless leg syndrome     Past Surgical History  Procedure Laterality Date  . Tonsillectomy    . Rectal surgery    . Bladder surgery    . Abdominal hysterectomy  2001  . Knee surgery  2003  . Dilation and curettage of uterus    . Cystocele repair  2010  . Rectocele repair  2010  . Breast biopsy  1987  . Breast cyst excision  1998  . Colonoscopy  09/19/2004    [id number] hemorrhoids, otherwise normal rectum/Sigmoid diverticula.  Remainder of colon mucosa appeared normal    Family History  Problem Relation Age of Onset  . Cancer Mother     Breast  . Hypertension Father   . Heart attack Father   . Cancer Sister     Breast  . Cancer Sister     breast    History   Social History  . Marital Status: Married    Spouse Name: N/A    Number of Children: N/A  . Years of Education: N/A   Occupational History  . Not on file.   Social History Main Topics  . Smoking status: Never Smoker   . Smokeless tobacco: Not on file  . Alcohol Use: No  . Drug Use: No  . Sexual Activity: Not on file   Other Topics Concern  . Not on file   Social History Narrative  . No narrative on file      ROS:  General: Negative for anorexia, weight loss, fever, chills, fatigue, weakness. Eyes: Negative  for vision changes.  ENT: Negative for hoarseness, difficulty swallowing , nasal congestion. CV: Negative for chest pain, angina, palpitations, dyspnea on exertion, peripheral edema.  Respiratory: Negative for dyspnea at rest, dyspnea on exertion, cough, sputum, wheezing.  GI: See history of present illness. GU:  Negative for dysuria, hematuria, urinary incontinence, urinary frequency, nocturnal urination.  MS: chronic joint pain Derm: Negative for rash or itching.  Neuro: Negative for weakness, abnormal sensation, seizure, frequent headaches, memory loss, confusion.  Psych: Negative for anxiety, depression, suicidal ideation, hallucinations.  Endo: Negative for unusual weight change.  Heme: Negative for bruising or bleeding. Allergy: Negative for rash or hives.    Physical Examination:  BP 141/80  Pulse 85  Temp(Src) 98.2 F (36.8 C) (Oral)  Ht 5' 5.5" (1.664 m)  Wt 176 lb 12.8 oz (80.196 kg)  BMI 28.96 kg/m2   General: Well-nourished, well-developed in no acute distress.  Head: Normocephalic, atraumatic.   Eyes: Conjunctiva pink, no icterus. Mouth: Oropharyngeal mucosa moist and pink , no lesions erythema or exudate. Neck: Supple without thyromegaly, masses, or lymphadenopathy.  Lungs: Clear to auscultation bilaterally.  Heart: Regular rate and rhythm, no murmurs rubs or gallops.  Abdomen: Bowel sounds are normal, mild epigastric tenderness, nondistended, no hepatosplenomegaly or masses, no abdominal bruits or    hernia , no rebound or guarding.   Rectal: not performed Extremities: No lower extremity edema. No clubbing or deformities.  Neuro: Alert and oriented x 4 , grossly normal neurologically.  Skin: Warm and dry, no rash or jaundice.   Psych: Alert and cooperative, normal mood and affect.  Labs: Lab Results  Component Value Date   ALT 25 05/20/2013   AST 21 05/20/2013   ALKPHOS 48 05/20/2013   BILITOT 0.8 05/20/2013     Imaging Studies: No results found.

## 2013-06-16 NOTE — Progress Notes (Signed)
CC'd to PCP 

## 2013-06-16 NOTE — Assessment & Plan Note (Signed)
67 y/o female with refractory GERD, epigastric pain with h/o chronic NSAID use which was discontinued over one month ago. ?complicated GERD/gastritis. Recommend EGD for further evaluation.  I have discussed the risks, alternatives, benefits with regards to but not limited to the risk of reaction to medication, bleeding, infection, perforation and the patient is agreeable to proceed. Written consent to be obtained.

## 2013-06-24 ENCOUNTER — Encounter (HOSPITAL_COMMUNITY): Payer: Self-pay | Admitting: *Deleted

## 2013-06-24 ENCOUNTER — Encounter (HOSPITAL_COMMUNITY)
Admission: RE | Disposition: A | Discharge status: Home / Self Care | Payer: Self-pay | Source: Ambulatory Visit | Attending: Internal Medicine

## 2013-06-24 ENCOUNTER — Ambulatory Visit (HOSPITAL_COMMUNITY)
Admission: RE | Admit: 2013-06-24 | Discharge: 2013-06-24 | Disposition: A | Discharge status: Home / Self Care | Payer: Medicare Other | Source: Ambulatory Visit | Attending: Internal Medicine | Admitting: Internal Medicine

## 2013-06-24 DIAGNOSIS — R1013 Epigastric pain: Secondary | ICD-10-CM

## 2013-06-24 DIAGNOSIS — K219 Gastro-esophageal reflux disease without esophagitis: Secondary | ICD-10-CM

## 2013-06-24 DIAGNOSIS — K2970 Gastritis, unspecified, without bleeding: Secondary | ICD-10-CM | POA: Diagnosis not present

## 2013-06-24 DIAGNOSIS — K921 Melena: Secondary | ICD-10-CM | POA: Insufficient documentation

## 2013-06-24 DIAGNOSIS — I10 Essential (primary) hypertension: Secondary | ICD-10-CM | POA: Insufficient documentation

## 2013-06-24 DIAGNOSIS — K3189 Other diseases of stomach and duodenum: Secondary | ICD-10-CM | POA: Insufficient documentation

## 2013-06-24 DIAGNOSIS — K449 Diaphragmatic hernia without obstruction or gangrene: Secondary | ICD-10-CM | POA: Insufficient documentation

## 2013-06-24 DIAGNOSIS — J4489 Other specified chronic obstructive pulmonary disease: Secondary | ICD-10-CM | POA: Insufficient documentation

## 2013-06-24 DIAGNOSIS — K648 Other hemorrhoids: Secondary | ICD-10-CM | POA: Diagnosis not present

## 2013-06-24 DIAGNOSIS — K2940 Chronic atrophic gastritis without bleeding: Secondary | ICD-10-CM | POA: Diagnosis not present

## 2013-06-24 DIAGNOSIS — J449 Chronic obstructive pulmonary disease, unspecified: Secondary | ICD-10-CM | POA: Diagnosis not present

## 2013-06-24 DIAGNOSIS — K5730 Diverticulosis of large intestine without perforation or abscess without bleeding: Secondary | ICD-10-CM | POA: Diagnosis not present

## 2013-06-24 DIAGNOSIS — D126 Benign neoplasm of colon, unspecified: Secondary | ICD-10-CM | POA: Insufficient documentation

## 2013-06-24 DIAGNOSIS — K649 Unspecified hemorrhoids: Secondary | ICD-10-CM

## 2013-06-24 DIAGNOSIS — D131 Benign neoplasm of stomach: Secondary | ICD-10-CM

## 2013-06-24 DIAGNOSIS — K625 Hemorrhage of anus and rectum: Secondary | ICD-10-CM

## 2013-06-24 HISTORY — PX: COLONOSCOPY WITH ESOPHAGOGASTRODUODENOSCOPY (EGD): SHX5779

## 2013-06-24 SURGERY — COLONOSCOPY WITH ESOPHAGOGASTRODUODENOSCOPY (EGD)
Anesthesia: Moderate Sedation

## 2013-06-24 MED ORDER — ONDANSETRON HCL 4 MG/2ML IJ SOLN
INTRAMUSCULAR | Status: AC
  Filled 2013-06-24: qty 2

## 2013-06-24 MED ORDER — MIDAZOLAM HCL 5 MG/5ML IJ SOLN
INTRAMUSCULAR | Status: DC | PRN
  Administered 2013-06-24 (×2): 1 mg via INTRAVENOUS
  Administered 2013-06-24: 2 mg via INTRAVENOUS
  Administered 2013-06-24 (×2): 1 mg via INTRAVENOUS
  Administered 2013-06-24: 2 mg via INTRAVENOUS

## 2013-06-24 MED ORDER — MEPERIDINE HCL 100 MG/ML IJ SOLN
INTRAMUSCULAR | Status: DC | PRN
  Administered 2013-06-24 (×3): 25 mg via INTRAVENOUS
  Administered 2013-06-24: 50 mg via INTRAVENOUS

## 2013-06-24 MED ORDER — MIDAZOLAM HCL 5 MG/5ML IJ SOLN
INTRAMUSCULAR | Status: AC
  Filled 2013-06-24: qty 10

## 2013-06-24 MED ORDER — STERILE WATER FOR IRRIGATION IR SOLN
Status: DC | PRN
  Administered 2013-06-24: 09:00:00

## 2013-06-24 MED ORDER — MEPERIDINE HCL 100 MG/ML IJ SOLN
INTRAMUSCULAR | Status: AC
  Filled 2013-06-24: qty 2

## 2013-06-24 MED ORDER — SODIUM CHLORIDE 0.9 % IV SOLN
INTRAVENOUS | Status: DC
  Administered 2013-06-24: 08:00:00 via INTRAVENOUS

## 2013-06-24 NOTE — H&P (View-Only) (Signed)
Primary Care Physician:  Harlow Asa, MD  Primary Gastroenterologist:  Roetta Sessions, MD   Chief Complaint  Patient presents with  . Gastrophageal Reflux    HPI:  Allison Freeman is a 67 y.o. female here for further evaluation of refractory GERD/epigastric pain. Chronic GERD for more than 5 years. No prior EGD.   9 years ago in trial for arthritis pill. H.pylori + at that time and was treated.   Several month h/o increased belching. Some epigastric tenderness, heartburn and regurgitation. Doesn't matter what she eats. No dysphagia/odynophagia. Lot of postnasal drip so feels like something in her throat all the time. No NSAIDs for one month, was on Lodine most recently. BM regular on Metamucil. First time with hemorrhoid issues couple of months ago. Little rectal pain, brbpr. No melena. Checked by Dr. Gerda Diss. Proctofoam one prescription and then OTC prn. On protonix for two years. Carafate did not help.    Current Outpatient Prescriptions  Medication Sig Dispense Refill  . albuterol (PROAIR HFA) 108 (90 BASE) MCG/ACT inhaler Inhale 2 puffs into the lungs every 6 (six) hours as needed for wheezing.      Marland Kitchen ALPRAZolam (XANAX) 0.5 MG tablet Take 0.5 mg by mouth at bedtime as needed for sleep.      . calcium carbonate (OS-CAL) 600 MG TABS Take 600 mg by mouth daily.      . fluticasone (FLONASE) 50 MCG/ACT nasal spray INSTILL TWO SPRAYS INTO EACH NOSTRIL ONCE DAILY  48 g  3  . lisinopril (PRINIVIL,ZESTRIL) 10 MG tablet Take 1 tablet (10 mg total) by mouth daily.  90 tablet  3  . magnesium gluconate (MAGONATE) 500 MG tablet Take 500 mg by mouth daily.      . Multiple Vitamin (MULTIVITAMIN) tablet Take 1 tablet by mouth daily.      . Omega-3 Fatty Acids (FISH OIL) 1200 MG CAPS Take 1 capsule by mouth daily.      . pantoprazole (PROTONIX) 40 MG tablet TAKE ONE TABLET EVERY MORNING  90 tablet  3  . pravastatin (PRAVACHOL) 40 MG tablet Take 1 tablet (40 mg total) by mouth daily.  90 tablet  3  .  Psyllium (METAMUCIL PO) Take by mouth 2 (two) times daily.      Marland Kitchen pyridOXINE (VITAMIN B-6) 100 MG tablet Take 100 mg by mouth daily.      Marland Kitchen rOPINIRole (REQUIP) 1 MG tablet Take 1 tablet (1 mg total) by mouth daily.  90 tablet  3  . tiZANidine (ZANAFLEX) 4 MG tablet Take 4 mg by mouth every 6 (six) hours as needed.       No current facility-administered medications for this visit.    Allergies as of 06/16/2013  . (No Known Allergies)    Past Medical History  Diagnosis Date  . Acid reflux   . Hypertension   . Hypercholesterolemia   . Arthritis   . Depression   . Panic attacks   . COPD (chronic obstructive pulmonary disease)     Mild  . Reflux   . Insomnia   . Osteopenia   . Restless leg syndrome     Past Surgical History  Procedure Laterality Date  . Tonsillectomy    . Rectal surgery    . Bladder surgery    . Abdominal hysterectomy  2001  . Knee surgery  2003  . Dilation and curettage of uterus    . Cystocele repair  2010  . Rectocele repair  2010  . Breast biopsy  1987  . Breast cyst excision  1998  . Colonoscopy  09/19/2004    [id number] hemorrhoids, otherwise normal rectum/Sigmoid diverticula.  Remainder of colon mucosa appeared normal    Family History  Problem Relation Age of Onset  . Cancer Mother     Breast  . Hypertension Father   . Heart attack Father   . Cancer Sister     Breast  . Cancer Sister     breast    History   Social History  . Marital Status: Married    Spouse Name: N/A    Number of Children: N/A  . Years of Education: N/A   Occupational History  . Not on file.   Social History Main Topics  . Smoking status: Never Smoker   . Smokeless tobacco: Not on file  . Alcohol Use: No  . Drug Use: No  . Sexual Activity: Not on file   Other Topics Concern  . Not on file   Social History Narrative  . No narrative on file      ROS:  General: Negative for anorexia, weight loss, fever, chills, fatigue, weakness. Eyes: Negative  for vision changes.  ENT: Negative for hoarseness, difficulty swallowing , nasal congestion. CV: Negative for chest pain, angina, palpitations, dyspnea on exertion, peripheral edema.  Respiratory: Negative for dyspnea at rest, dyspnea on exertion, cough, sputum, wheezing.  GI: See history of present illness. GU:  Negative for dysuria, hematuria, urinary incontinence, urinary frequency, nocturnal urination.  MS: chronic joint pain Derm: Negative for rash or itching.  Neuro: Negative for weakness, abnormal sensation, seizure, frequent headaches, memory loss, confusion.  Psych: Negative for anxiety, depression, suicidal ideation, hallucinations.  Endo: Negative for unusual weight change.  Heme: Negative for bruising or bleeding. Allergy: Negative for rash or hives.    Physical Examination:  BP 141/80  Pulse 85  Temp(Src) 98.2 F (36.8 C) (Oral)  Ht 5' 5.5" (1.664 m)  Wt 176 lb 12.8 oz (80.196 kg)  BMI 28.96 kg/m2   General: Well-nourished, well-developed in no acute distress.  Head: Normocephalic, atraumatic.   Eyes: Conjunctiva pink, no icterus. Mouth: Oropharyngeal mucosa moist and pink , no lesions erythema or exudate. Neck: Supple without thyromegaly, masses, or lymphadenopathy.  Lungs: Clear to auscultation bilaterally.  Heart: Regular rate and rhythm, no murmurs rubs or gallops.  Abdomen: Bowel sounds are normal, mild epigastric tenderness, nondistended, no hepatosplenomegaly or masses, no abdominal bruits or    hernia , no rebound or guarding.   Rectal: not performed Extremities: No lower extremity edema. No clubbing or deformities.  Neuro: Alert and oriented x 4 , grossly normal neurologically.  Skin: Warm and dry, no rash or jaundice.   Psych: Alert and cooperative, normal mood and affect.  Labs: Lab Results  Component Value Date   ALT 25 05/20/2013   AST 21 05/20/2013   ALKPHOS 48 05/20/2013   BILITOT 0.8 05/20/2013     Imaging Studies: No results found.

## 2013-06-24 NOTE — Op Note (Signed)
[facility] [address] [place], [address]   COLONOSCOPY PROCEDURE REPORT  PATIENT: Allison, Freeman  MR#:         [id number] BIRTHDATE: 08/31/1946 , 67  yrs. old GENDER: Female ENDOSCOPIST: R.  Roetta Sessions, MD FACP [facility] REFERRED BY:  Simone Curia, [name] PROCEDURE DATE:  06/24/2013 PROCEDURE:     Ileocolonoscopy with biopsy  INDICATIONS: hematochezia  INFORMED CONSENT:  The risks, benefits, alternatives and imponderables including but not limited to bleeding, perforation as well as the possibility of a missed lesion have been reviewed.  The potential for biopsy, lesion removal, etc. have also been discussed.  Questions have been answered.  All parties agreeable. Please see the history and physical in the medical record for more information.  MEDICATIONS: Versed 8 mg IV and Demerol 125 mg IV in divided doses. Zofran 4 mg IV.  DESCRIPTION OF PROCEDURE:  After a digital rectal exam was performed, the EG-[id number] ([id number]) and EC-[id number] ([id number]) colonoscope was advanced from the anus through the rectum and colon to the area of the cecum, ileocecal valve and appendiceal orifice. The cecum was deeply intubated.  These structures were well-seen and photographed for the record.  From the level of the cecum and ileocecal valve, the scope was slowly and cautiously withdrawn. The mucosal surfaces were carefully surveyed utilizing scope tip deflection to facilitate fold flattening as needed.  The scope was pulled down into the rectum where a thorough examination including retroflexion was performed.    FINDINGS:  Adequate preparation. Somewhat engorged internal hemorrhoids with anal papilla; otherwise, normal rectum. Fairly extensive left-sided diverticula; (1) diminutive polyp in the base of the cecum. The distal 5 cm of terminal ileal mucosa appeared normal.  THERAPEUTIC / DIAGNOSTIC MANEUVERS PERFORMED:  The above-mentioned polyp was cold  biopsied/removed  COMPLICATIONS: None  CECAL WITHDRAWAL TIME:  11 minutes  IMPRESSION:  Prominent internal hemorrhoids-likely source of hematochezia. Colonic diverticulosis. Colonic polyp-removed as described above  RECOMMENDATIONS: Continue daily fiber supplementation. Ten-day Course of Anusol suppositories. Patient may ultimately best benefit from hemorrhoidal banding in the near future.  Followup on pathology. See EGD report   _______________________________ eSigned:  R. Roetta Sessions, MD FACP [facility] 06/24/2013 9:54 AM   CC:

## 2013-06-24 NOTE — Op Note (Signed)
[facility] [address] [place], [address]   ENDOSCOPY PROCEDURE REPORT  PATIENT: Allison Freeman, Allison Freeman  MR#: [id number] BIRTHDATE: March 12, 1946 , 67  yrs. old GENDER: Female ENDOSCOPIST: R.  Roetta Sessions, MD FACP [facility] REFERRED BY:  Simone Curia, [name] PROCEDURE DATE:  06/24/2013 PROCEDURE:     EGD with gastric biopsy  INDICATIONS:     refractory dyspepsia  INFORMED CONSENT:   The risks, benefits, limitations, alternatives and imponderables have been discussed.  The potential for biopsy, esophogeal dilation, etc. have also been reviewed.  Questions have been answered.  All parties agreeable.  Please see the history and physical in the medical record for more information.  MEDICATIONS:  Demerol 100 mg IV and Versed 5 mg IV and Zofran 4 mg IV. Cetacaine spray.  DESCRIPTION OF PROCEDURE:   The [id number] ([id number])  endoscope was introduced through the mouth and advanced to the second portion of the duodenum without difficulty or limitations.  The mucosal surfaces were surveyed very carefully during advancement of the scope and upon withdrawal.  Retroflexion view of the proximal stomach and esophagogastric junction was performed.      FINDINGS:    Normal appearing tubular esophagus. Stomach empty. Area of focal erosions on the posterior gastric wall extending into the antrum. No ulcer or infiltrating process. Single 4 mm benign-appearing polyp in the cardia.  Small hiatal hernia. Patent pylorus. Normal first and second portion of the duodenum.  THERAPEUTIC / DIAGNOSTIC MANEUVERS PERFORMED:  The gastric polyps biopsied and removed. The area of abnormal gastric mucosa in the antrum was biopsied.   COMPLICATIONS:  None  IMPRESSION:   Gastric polyp-removed. Small hiatal hernia. Abnormal gastric mucosa-query NSAID effect-status post biopsy  RECOMMENDATIONS:   Continue to avoid nonsteroidal agents. Stop Protonix.  Begin Dexilant 60 mg daily. Followup on  pathology. See colonoscopy report.    _______________________________ R. Roetta Sessions, MD FACP [facility] eSigned:  R. Roetta Sessions, MD FACP [facility] 06/24/2013 9:29 AM     CC:  PATIENT NAME:  Allison Freeman, Allison Freeman MR#: [id number]

## 2013-06-24 NOTE — Interval H&P Note (Signed)
History and Physical Interval Note:  06/24/2013 8:56 AM  Allison Freeman  has presented today for surgery, with the diagnosis of RECTAL BLEEDING, HEMORRHOIDS, GERD AND EPIGASTRIC PAIN  The various methods of treatment have been discussed with the patient and family. After consideration of risks, benefits and other options for treatment, the patient has consented to  Procedure(s) with comments: COLONOSCOPY WITH ESOPHAGOGASTRODUODENOSCOPY (EGD) (N/A) - 8:30 as a surgical intervention .  The patient's history has been reviewed, patient examined, no change in status, stable for surgery.  I have reviewed the patient's chart and labs.  Questions were answered to the patient's satisfaction.     Upper abdominal pain steadily improved, but not totally resolved, since being seen in the office. EGD and colonoscopy per plan.   Eula Listen

## 2013-06-28 ENCOUNTER — Encounter (HOSPITAL_COMMUNITY): Payer: Self-pay | Admitting: Internal Medicine

## 2013-06-29 ENCOUNTER — Encounter: Payer: Self-pay | Admitting: Internal Medicine

## 2013-06-30 DIAGNOSIS — Z23 Encounter for immunization: Secondary | ICD-10-CM | POA: Diagnosis not present

## 2013-07-05 ENCOUNTER — Encounter: Payer: Self-pay | Admitting: Internal Medicine

## 2013-07-06 ENCOUNTER — Other Ambulatory Visit: Payer: Self-pay | Admitting: Family Medicine

## 2013-07-06 NOTE — Telephone Encounter (Signed)
Ok plus five ref 

## 2013-08-02 ENCOUNTER — Encounter: Payer: Self-pay | Admitting: Gastroenterology

## 2013-08-02 ENCOUNTER — Ambulatory Visit (INDEPENDENT_AMBULATORY_CARE_PROVIDER_SITE_OTHER): Payer: Medicare Other | Admitting: Gastroenterology

## 2013-08-02 VITALS — BP 129/76 | HR 78 | Temp 97.1°F | Wt 179.4 lb

## 2013-08-02 DIAGNOSIS — M5137 Other intervertebral disc degeneration, lumbosacral region: Secondary | ICD-10-CM | POA: Diagnosis not present

## 2013-08-02 DIAGNOSIS — M545 Low back pain: Secondary | ICD-10-CM | POA: Diagnosis not present

## 2013-08-02 DIAGNOSIS — IMO0002 Reserved for concepts with insufficient information to code with codable children: Secondary | ICD-10-CM | POA: Diagnosis not present

## 2013-08-02 DIAGNOSIS — K219 Gastro-esophageal reflux disease without esophagitis: Secondary | ICD-10-CM | POA: Diagnosis not present

## 2013-08-02 DIAGNOSIS — K649 Unspecified hemorrhoids: Secondary | ICD-10-CM

## 2013-08-02 MED ORDER — PANTOPRAZOLE SODIUM 40 MG PO TBEC: 40.0000 mg | DELAYED_RELEASE_TABLET | Freq: Two times a day (BID) | ORAL | Status: DC

## 2013-08-02 NOTE — Patient Instructions (Signed)
1. Increase in pantoprazole to twice daily before breakfast and evening meal. You may go back to once daily after a couple months if you're feeling well. 2. Try to limit your arthritis medication to a minimum as discussed today. 3. Call us if you are interested in hemorrhoid banding as discussed. 4. Please eliminate use of a straw, chewing gum. Reduce consumption of hard candies. These activities are likely contributing to your increased belching.  Gastroesophageal Reflux Disease, Adult Gastroesophageal reflux disease (GERD) happens when acid from your stomach flows up into the esophagus. When acid comes in contact with the esophagus, the acid causes soreness (inflammation) in the esophagus. Over time, GERD may create small holes (ulcers) in the lining of the esophagus. CAUSES   Increased body weight. This puts pressure on the stomach, making acid rise from the stomach into the esophagus.  Smoking. This increases acid production in the stomach.  Drinking alcohol. This causes decreased pressure in the lower esophageal sphincter (valve or ring of muscle between the esophagus and stomach), allowing acid from the stomach into the esophagus.  Late evening meals and a full stomach. This increases pressure and acid production in the stomach.  A malformed lower esophageal sphincter. Sometimes, no cause is found. SYMPTOMS   Burning pain in the lower part of the mid-chest behind the breastbone and in the mid-stomach area. This may occur twice a week or more often.  Trouble swallowing.  Sore throat.  Dry cough.  Asthma-like symptoms including chest tightness, shortness of breath, or wheezing. DIAGNOSIS  Your caregiver may be able to diagnose GERD based on your symptoms. In some cases, X-rays and other tests may be done to check for complications or to check the condition of your stomach and esophagus. TREATMENT  Your caregiver may recommend over-the-counter or prescription medicines to help  decrease acid production. Ask your caregiver before starting or adding any new medicines.  HOME CARE INSTRUCTIONS   Change the factors that you can control. Ask your caregiver for guidance concerning weight loss, quitting smoking, and alcohol consumption.  Avoid foods and drinks that make your symptoms worse, such as:  Caffeine or alcoholic drinks.  Chocolate.  Peppermint or mint flavorings.  Garlic and onions.  Spicy foods.  Citrus fruits, such as oranges, lemons, or limes.  Tomato-based foods such as sauce, chili, salsa, and pizza.  Fried and fatty foods.  Avoid lying down for the 3 hours prior to your bedtime or prior to taking a nap.  Eat small, frequent meals instead of large meals.  Wear loose-fitting clothing. Do not wear anything tight around your waist that causes pressure on your stomach.  Raise the head of your bed 6 to 8 inches with wood blocks to help you sleep. Extra pillows will not help.  Only take over-the-counter or prescription medicines for pain, discomfort, or fever as directed by your caregiver.  Do not take aspirin, ibuprofen, or other nonsteroidal anti-inflammatory drugs (NSAIDs). SEEK IMMEDIATE MEDICAL CARE IF:   You have pain in your arms, neck, jaw, teeth, or back.  Your pain increases or changes in intensity or duration.  You develop nausea, vomiting, or sweating (diaphoresis).  You develop shortness of breath, or you faint.  Your vomit is green, yellow, black, or looks like coffee grounds or blood.  Your stool is red, bloody, or black. These symptoms could be signs of other problems, such as heart disease, gastric bleeding, or esophageal bleeding. MAKE SURE YOU:   Understand these instructions.  Will watch your  condition.  Will get help right away if you are not doing well or get worse. Document Released: 06/18/2005 Document Revised: 12/01/2011 Document Reviewed: 03/28/2011 [facility] Patient Information 2014 [place], [place].

## 2013-08-02 NOTE — Assessment & Plan Note (Signed)
Really denies epigastric pain at this point. Continues to have heartburn and belching fairly regularly. Did not appreciate any improvement with Dexilant over the last one month. Significantly more expensive than pantoprazole without any noted increased benefit. Go back to pantoprazole but increase to twice a day for 2 months. New prescription provided. If too expensive she will let me know and at that point may consider switching PPI versus providing samples of a PPI for her nighttime dose. She describes significant use of straw for drinking, consumes hard candies and chewing gum multiple times daily. Likely contributing to increased belching. I would like for her to eliminate use of a straw and chewing gum. She will limit use of hard candies which she uses for dry mouth. If no noted improvement of her symptoms she will let me know.

## 2013-08-02 NOTE — Progress Notes (Signed)
Primary Care Physician: Harlow Asa, MD  Primary Gastroenterologist:  Roetta Sessions, MD   Chief Complaint  Patient presents with  . Follow-up    HPI: Allison Freeman is a 67 y.o. female here for followup or refractory GERD/epigastric pain. Also with rectal pain/rectal bleeding. Recent EGD showed gastric polyp which was hyperplastic, small hiatal hernia, chronic inactive gastritis but no H. pylori. Possible NSAID effect. Colonoscopy showed prominent internal hemorrhoids, colonic diverticulosis, tubular adenoma removed as well. Distal 5 cm of the terminal ileum appeared normal. Protonix was stopped. She was started on Dexilant. Doesn't feel like it is any better.   Still having some intermittent burning. Lot of belching. No dysphagia. No abdominal pain. No constipation/diarrhea on Metamucil. Right now no brbpr. Wants to the which anti-inflammatory she can get away with using for her arthritis. Tylenol is not helping. Going for back injections today. Has been off of anti-inflammatories for several months. Discussed using Aleve over-the-counter twice a day for now.  Current Outpatient Prescriptions  Medication Sig Dispense Refill  . albuterol (PROAIR HFA) 108 (90 BASE) MCG/ACT inhaler Inhale 2 puffs into the lungs every 6 (six) hours as needed for wheezing.      Marland Kitchen ALPRAZolam (XANAX) 0.5 MG tablet TAKE ONE TABLET AT BEDTIME AS NEEDED  30 tablet  5  . dexlansoprazole (DEXILANT) 60 MG capsule Take 60 mg by mouth daily.      . fluticasone (FLONASE) 50 MCG/ACT nasal spray INSTILL TWO SPRAYS INTO EACH NOSTRIL ONCE DAILY  48 g  3  . lisinopril (PRINIVIL,ZESTRIL) 10 MG tablet Take 1 tablet (10 mg total) by mouth daily.  90 tablet  3  . magnesium gluconate (MAGONATE) 500 MG tablet Take 500 mg by mouth daily.      . Multiple Vitamin (MULTIVITAMIN) tablet Take 1 tablet by mouth daily.      . Omega-3 Fatty Acids (FISH OIL) 1200 MG CAPS Take 1 capsule by mouth daily.      . pravastatin (PRAVACHOL) 40 MG  tablet Take 1 tablet (40 mg total) by mouth daily.  90 tablet  3  . pyridOXINE (VITAMIN B-6) 100 MG tablet Take 100 mg by mouth daily.      Marland Kitchen rOPINIRole (REQUIP) 1 MG tablet Take 1 tablet (1 mg total) by mouth daily.  90 tablet  3  . tiZANidine (ZANAFLEX) 4 MG tablet Take 4 mg by mouth every 6 (six) hours as needed.       No current facility-administered medications for this visit.    Allergies as of 08/02/2013  . (No Known Allergies)    ROS:  General: Negative for anorexia, weight loss, fever, chills, fatigue, weakness. ENT: Negative for hoarseness, difficulty swallowing , nasal congestion. CV: Negative for chest pain, angina, palpitations, dyspnea on exertion, peripheral edema.  Respiratory: Negative for dyspnea at rest, dyspnea on exertion, cough, sputum, wheezing.  GI: See history of present illness. GU:  Negative for dysuria, hematuria, urinary incontinence, urinary frequency, nocturnal urination.  Endo: Negative for unusual weight change.    Physical Examination:   BP 129/76  Pulse 78  Temp(Src) 97.1 F (36.2 C) (Oral)  Wt 179 lb 6.4 oz (81.375 kg)  General: Well-nourished, well-developed in no acute distress.  Eyes: No icterus. Mouth: Oropharyngeal mucosa moist and pink , no lesions erythema or exudate. Lungs: Clear to auscultation bilaterally.  Heart: Regular rate and rhythm, no murmurs rubs or gallops.  Abdomen: Bowel sounds are normal, nontender, nondistended, no hepatosplenomegaly or masses, no abdominal bruits or hernia ,  no rebound or guarding.   Extremities: No lower extremity edema. No clubbing or deformities. Neuro: Alert and oriented x 4   Skin: Warm and dry, no jaundice.   Psych: Alert and cooperative, normal mood and affect.

## 2013-08-02 NOTE — Progress Notes (Signed)
cc'd to pcp 

## 2013-08-02 NOTE — Assessment & Plan Note (Signed)
Patient states that she would be interested in hemorrhoid banding at some point in the future but not right at this time. She will call to make appointment when she is ready. Handout provided to patient regarding [facility] hemorrhoid banding.

## 2013-08-19 ENCOUNTER — Encounter: Payer: Self-pay | Admitting: Orthopedic Surgery

## 2013-08-22 DIAGNOSIS — M545 Low back pain: Secondary | ICD-10-CM | POA: Diagnosis not present

## 2013-08-22 DIAGNOSIS — G894 Chronic pain syndrome: Secondary | ICD-10-CM | POA: Diagnosis not present

## 2013-08-22 DIAGNOSIS — IMO0002 Reserved for concepts with insufficient information to code with codable children: Secondary | ICD-10-CM | POA: Diagnosis not present

## 2013-08-22 DIAGNOSIS — M5137 Other intervertebral disc degeneration, lumbosacral region: Secondary | ICD-10-CM | POA: Diagnosis not present

## 2013-08-22 DIAGNOSIS — M48062 Spinal stenosis, lumbar region with neurogenic claudication: Secondary | ICD-10-CM | POA: Diagnosis not present

## 2013-08-25 DIAGNOSIS — G894 Chronic pain syndrome: Secondary | ICD-10-CM | POA: Diagnosis not present

## 2013-08-25 DIAGNOSIS — IMO0002 Reserved for concepts with insufficient information to code with codable children: Secondary | ICD-10-CM | POA: Diagnosis not present

## 2013-08-25 DIAGNOSIS — M5137 Other intervertebral disc degeneration, lumbosacral region: Secondary | ICD-10-CM | POA: Diagnosis not present

## 2013-09-01 DIAGNOSIS — L659 Nonscarring hair loss, unspecified: Secondary | ICD-10-CM | POA: Diagnosis not present

## 2013-09-01 DIAGNOSIS — N952 Postmenopausal atrophic vaginitis: Secondary | ICD-10-CM | POA: Diagnosis not present

## 2013-09-01 DIAGNOSIS — N949 Unspecified condition associated with female genital organs and menstrual cycle: Secondary | ICD-10-CM | POA: Diagnosis not present

## 2013-09-01 DIAGNOSIS — L658 Other specified nonscarring hair loss: Secondary | ICD-10-CM | POA: Diagnosis not present

## 2013-09-01 DIAGNOSIS — Z124 Encounter for screening for malignant neoplasm of cervix: Secondary | ICD-10-CM | POA: Diagnosis not present

## 2013-09-01 DIAGNOSIS — R32 Unspecified urinary incontinence: Secondary | ICD-10-CM | POA: Diagnosis not present

## 2013-09-01 DIAGNOSIS — Z01419 Encounter for gynecological examination (general) (routine) without abnormal findings: Secondary | ICD-10-CM | POA: Diagnosis not present

## 2013-09-19 DIAGNOSIS — M619 Calcification and ossification of muscle, unspecified: Secondary | ICD-10-CM | POA: Diagnosis not present

## 2013-09-19 DIAGNOSIS — M545 Low back pain: Secondary | ICD-10-CM | POA: Diagnosis not present

## 2013-09-21 DIAGNOSIS — M619 Calcification and ossification of muscle, unspecified: Secondary | ICD-10-CM | POA: Diagnosis not present

## 2013-09-21 DIAGNOSIS — M545 Low back pain: Secondary | ICD-10-CM | POA: Diagnosis not present

## 2013-09-26 DIAGNOSIS — M545 Low back pain, unspecified: Secondary | ICD-10-CM | POA: Diagnosis not present

## 2013-09-26 DIAGNOSIS — M619 Calcification and ossification of muscle, unspecified: Secondary | ICD-10-CM | POA: Diagnosis not present

## 2013-10-04 DIAGNOSIS — M899 Disorder of bone, unspecified: Secondary | ICD-10-CM | POA: Diagnosis not present

## 2013-10-04 DIAGNOSIS — M949 Disorder of cartilage, unspecified: Secondary | ICD-10-CM | POA: Diagnosis not present

## 2013-10-07 ENCOUNTER — Ambulatory Visit (INDEPENDENT_AMBULATORY_CARE_PROVIDER_SITE_OTHER): Payer: Medicare Other | Admitting: Urology

## 2013-10-07 DIAGNOSIS — N8111 Cystocele, midline: Secondary | ICD-10-CM | POA: Diagnosis not present

## 2013-10-07 DIAGNOSIS — N3941 Urge incontinence: Secondary | ICD-10-CM | POA: Diagnosis not present

## 2013-10-07 DIAGNOSIS — R82998 Other abnormal findings in urine: Secondary | ICD-10-CM | POA: Diagnosis not present

## 2013-10-07 DIAGNOSIS — R32 Unspecified urinary incontinence: Secondary | ICD-10-CM | POA: Diagnosis not present

## 2013-10-07 DIAGNOSIS — N952 Postmenopausal atrophic vaginitis: Secondary | ICD-10-CM | POA: Diagnosis not present

## 2013-10-17 ENCOUNTER — Telehealth: Payer: Self-pay | Admitting: Family Medicine

## 2013-10-17 ENCOUNTER — Other Ambulatory Visit: Payer: Self-pay

## 2013-10-17 MED ORDER — PANTOPRAZOLE SODIUM 40 MG PO TBEC: 40.0000 mg | DELAYED_RELEASE_TABLET | Freq: Two times a day (BID) | ORAL | Status: DC

## 2013-10-17 MED ORDER — PRAVASTATIN SODIUM 40 MG PO TABS: 40.0000 mg | ORAL_TABLET | Freq: Every day | ORAL | Status: DC

## 2013-10-17 MED ORDER — ROPINIROLE HCL 1 MG PO TABS: 1.0000 mg | ORAL_TABLET | Freq: Every day | ORAL | Status: DC

## 2013-10-17 MED ORDER — LISINOPRIL 10 MG PO TABS: 10.0000 mg | ORAL_TABLET | Freq: Every day | ORAL | Status: DC

## 2013-10-17 NOTE — Telephone Encounter (Signed)
Med faxed and pt notified.  

## 2013-10-17 NOTE — Telephone Encounter (Signed)
Patient would like [place] to fax in her 90 day R's to [facility] Fax# [phone], the rx's she called about this morning

## 2013-10-17 NOTE — Telephone Encounter (Signed)
Scripts printed per patient request. Advised patient scripts will be ready this afternoon for pickup.

## 2013-10-17 NOTE — Telephone Encounter (Signed)
Patient needs refill on mailorder prescriptions: lisinopril 10mg ,ropinirole 1mg ,pravastatin 40mg ,tantoprazole 40mg  . Please call when ready.

## 2013-11-11 ENCOUNTER — Ambulatory Visit (INDEPENDENT_AMBULATORY_CARE_PROVIDER_SITE_OTHER): Payer: Medicare Other | Admitting: Urology

## 2013-11-11 DIAGNOSIS — N3941 Urge incontinence: Secondary | ICD-10-CM

## 2013-12-16 ENCOUNTER — Ambulatory Visit (INDEPENDENT_AMBULATORY_CARE_PROVIDER_SITE_OTHER): Payer: Medicare Other | Admitting: Urology

## 2013-12-16 DIAGNOSIS — R350 Frequency of micturition: Secondary | ICD-10-CM

## 2013-12-16 DIAGNOSIS — N3941 Urge incontinence: Secondary | ICD-10-CM | POA: Diagnosis not present

## 2013-12-16 DIAGNOSIS — R82998 Other abnormal findings in urine: Secondary | ICD-10-CM | POA: Diagnosis not present

## 2014-01-12 ENCOUNTER — Other Ambulatory Visit: Payer: Self-pay | Admitting: Family Medicine

## 2014-01-12 NOTE — Telephone Encounter (Signed)
Ok plus 5 monthy ref if time

## 2014-01-19 ENCOUNTER — Ambulatory Visit (INDEPENDENT_AMBULATORY_CARE_PROVIDER_SITE_OTHER): Payer: Medicare Other | Admitting: Nurse Practitioner

## 2014-01-19 ENCOUNTER — Encounter: Payer: Self-pay | Admitting: Nurse Practitioner

## 2014-01-19 VITALS — BP 108/80 | Temp 98.5°F | Ht 65.0 in | Wt 180.1 lb

## 2014-01-19 DIAGNOSIS — N9089 Other specified noninflammatory disorders of vulva and perineum: Secondary | ICD-10-CM | POA: Diagnosis not present

## 2014-01-19 DIAGNOSIS — M1990 Unspecified osteoarthritis, unspecified site: Secondary | ICD-10-CM

## 2014-01-19 DIAGNOSIS — N760 Acute vaginitis: Secondary | ICD-10-CM | POA: Diagnosis not present

## 2014-01-19 DIAGNOSIS — N907 Vulvar cyst: Secondary | ICD-10-CM

## 2014-01-19 DIAGNOSIS — N762 Acute vulvitis: Secondary | ICD-10-CM

## 2014-01-19 MED ORDER — TRAMADOL HCL 50 MG PO TABS: 50.0000 mg | ORAL_TABLET | Freq: Three times a day (TID) | ORAL | Status: DC | PRN

## 2014-01-19 MED ORDER — SULFAMETHOXAZOLE-TMP DS 800-160 MG PO TABS
1.0000 | ORAL_TABLET | Freq: Two times a day (BID) | ORAL | Status: DC
Start: 2014-01-19 — End: 2014-02-24

## 2014-01-21 ENCOUNTER — Encounter: Payer: Self-pay | Admitting: Nurse Practitioner

## 2014-01-21 DIAGNOSIS — M1990 Unspecified osteoarthritis, unspecified site: Secondary | ICD-10-CM | POA: Insufficient documentation

## 2014-01-21 NOTE — Progress Notes (Signed)
Subjective:  Presents for c/o painful area left labia x 1 week. Has been squeezing the area, noticed white material. Area tender. No fever. No other rash. Had a blackhead in the area for a long time, this began after patient removed it. Area has decreased in size. Also has significant osteoarthritis. Has had reflux with every NSAID she has been given. Requesting something for the days when she has sever pain.  Objective:   BP 108/80  Temp(Src) 98.5 F (36.9 C) (Oral)  Ht 5\' 5"  (1.651 m)  Wt 180 lb 2 oz (81.704 kg)  BMI 29.97 kg/m2 NAD. Alert, oriented. Localized area of tenderness and edema noted upper left labia. Superficial. Severe osteoarthritis changes noted in both hands.  Assessment: Cellulitis of labia majora - Plan: Wound culture  Osteoarthritis  Labial cyst  Plan:  Meds ordered this encounter  Medications  . sulfamethoxazole-trimethoprim (BACTRIM DS) 800-160 MG per tablet    Sig: Take 1 tablet by mouth 2 (two) times daily.    Dispense:  14 tablet    Refill:  0    Order Specific Question:  Supervising Provider    Answer:  Mikey Kirschner [[id number]]  . traMADol (ULTRAM) 50 MG tablet    Sig: Take 1 tablet (50 mg total) by mouth every 8 (eight) hours as needed.    Dispense:  30 tablet    Refill:  0    Order Specific Question:  Supervising Provider    Answer:  Mikey Kirschner [[id number]]  warm compresses to labia. Warning signs reviewed. Call back on 5/4 if no better. Trial of ultram for severe arthritis pain. Routine follow up.

## 2014-01-22 LAB — WOUND CULTURE
GRAM STAIN: NONE SEEN
Gram Stain: NONE SEEN

## 2014-02-01 ENCOUNTER — Other Ambulatory Visit: Payer: Self-pay | Admitting: Family Medicine

## 2014-02-03 DIAGNOSIS — H18519 Endothelial corneal dystrophy, unspecified eye: Secondary | ICD-10-CM | POA: Diagnosis not present

## 2014-02-03 DIAGNOSIS — H524 Presbyopia: Secondary | ICD-10-CM | POA: Diagnosis not present

## 2014-02-03 DIAGNOSIS — H5200 Hypermetropia, unspecified eye: Secondary | ICD-10-CM | POA: Diagnosis not present

## 2014-02-03 DIAGNOSIS — H353190 Nonexudative age-related macular degeneration, unspecified eye, stage unspecified: Secondary | ICD-10-CM | POA: Diagnosis not present

## 2014-02-06 ENCOUNTER — Telehealth: Payer: Self-pay | Admitting: Family Medicine

## 2014-02-06 MED ORDER — TRAMADOL HCL 50 MG PO TABS: 50.0000 mg | ORAL_TABLET | Freq: Three times a day (TID) | ORAL | Status: DC | PRN

## 2014-02-06 NOTE — Telephone Encounter (Signed)
Ok plus 5 monthly ref 

## 2014-02-06 NOTE — Telephone Encounter (Signed)
Last seen 01/19/14 

## 2014-02-06 NOTE — Telephone Encounter (Signed)
Script printed and will be faxed upon signature. Patient was notified.

## 2014-02-06 NOTE — Telephone Encounter (Signed)
Patient needs Rx for Tramadol to [facility].

## 2014-02-15 ENCOUNTER — Telehealth: Payer: Self-pay | Admitting: Family Medicine

## 2014-02-15 DIAGNOSIS — Z79899 Other long term (current) drug therapy: Secondary | ICD-10-CM

## 2014-02-15 DIAGNOSIS — E782 Mixed hyperlipidemia: Secondary | ICD-10-CM

## 2014-02-15 NOTE — Telephone Encounter (Signed)
Lip liv m7 and chronic f u visit with me to disc results

## 2014-02-15 NOTE — Telephone Encounter (Signed)
Pt wants to know if she needs her Cholesterol checked again?  It's been since Sept 3rd since she had it checked.   Call pt if sending labs to [facility]

## 2014-02-16 NOTE — Telephone Encounter (Signed)
Blood work orders placed in [employer]. Patient notified and scheduled follow up office visit.

## 2014-02-17 ENCOUNTER — Ambulatory Visit (INDEPENDENT_AMBULATORY_CARE_PROVIDER_SITE_OTHER): Payer: Medicare Other | Admitting: Urology

## 2014-02-17 DIAGNOSIS — N3941 Urge incontinence: Secondary | ICD-10-CM | POA: Diagnosis not present

## 2014-02-17 DIAGNOSIS — R350 Frequency of micturition: Secondary | ICD-10-CM | POA: Diagnosis not present

## 2014-02-20 DIAGNOSIS — E782 Mixed hyperlipidemia: Secondary | ICD-10-CM | POA: Diagnosis not present

## 2014-02-20 DIAGNOSIS — Z79899 Other long term (current) drug therapy: Secondary | ICD-10-CM | POA: Diagnosis not present

## 2014-02-21 LAB — HEPATIC FUNCTION PANEL
ALBUMIN: 3.5 g/dL (ref 3.5–5.2)
ALK PHOS: 46 U/L (ref 39–117)
ALT: 16 U/L (ref 0–35)
AST: 19 U/L (ref 0–37)
BILIRUBIN TOTAL: 0.6 mg/dL (ref 0.2–1.2)
Bilirubin, Direct: 0.1 mg/dL (ref 0.0–0.3)
Indirect Bilirubin: 0.5 mg/dL (ref 0.2–1.2)
Total Protein: 6.2 g/dL (ref 6.0–8.3)

## 2014-02-21 LAB — LIPID PANEL
CHOL/HDL RATIO: 3.6 ratio
Cholesterol: 148 mg/dL (ref 0–200)
HDL: 41 mg/dL (ref >39)
LDL CALC: 92 mg/dL (ref 0–99)
Triglycerides: 73 mg/dL (ref <150)
VLDL: 15 mg/dL (ref 0–40)

## 2014-02-21 LAB — BASIC METABOLIC PANEL
BUN: 20 mg/dL (ref 6–23)
CHLORIDE: 108 meq/L (ref 96–112)
CO2: 28 meq/L (ref 19–32)
Calcium: 8.9 mg/dL (ref 8.4–10.5)
Creat: 1.17 mg/dL — ABNORMAL HIGH (ref 0.50–1.10)
Glucose, Bld: 100 mg/dL — ABNORMAL HIGH (ref 70–99)
Potassium: 4.5 mEq/L (ref 3.5–5.3)
SODIUM: 141 meq/L (ref 135–145)

## 2014-02-24 ENCOUNTER — Ambulatory Visit (INDEPENDENT_AMBULATORY_CARE_PROVIDER_SITE_OTHER): Payer: Medicare Other | Admitting: Family Medicine

## 2014-02-24 ENCOUNTER — Encounter: Payer: Self-pay | Admitting: Family Medicine

## 2014-02-24 VITALS — BP 138/86 | Ht 65.0 in | Wt 179.8 lb

## 2014-02-24 DIAGNOSIS — K219 Gastro-esophageal reflux disease without esophagitis: Secondary | ICD-10-CM | POA: Diagnosis not present

## 2014-02-24 DIAGNOSIS — J309 Allergic rhinitis, unspecified: Secondary | ICD-10-CM

## 2014-02-24 DIAGNOSIS — M549 Dorsalgia, unspecified: Secondary | ICD-10-CM | POA: Diagnosis not present

## 2014-02-24 MED ORDER — FLUTICASONE PROPIONATE 50 MCG/ACT NA SUSP
NASAL | Status: DC
Start: 2014-02-24 — End: 2015-05-30

## 2014-02-24 MED ORDER — PANTOPRAZOLE SODIUM 40 MG PO TBEC
40.0000 mg | DELAYED_RELEASE_TABLET | Freq: Two times a day (BID) | ORAL | Status: DC
Start: 2014-02-24 — End: 2014-06-15

## 2014-02-24 MED ORDER — PRAVASTATIN SODIUM 40 MG PO TABS: 40.0000 mg | ORAL_TABLET | Freq: Every day | ORAL | Status: DC

## 2014-02-24 MED ORDER — ALPRAZOLAM 0.5 MG PO TABS: ORAL_TABLET | ORAL | Status: DC

## 2014-02-24 MED ORDER — LISINOPRIL 10 MG PO TABS
10.0000 mg | ORAL_TABLET | Freq: Every day | ORAL | Status: DC
Start: 2014-02-24 — End: 2014-06-15

## 2014-02-24 MED ORDER — ROPINIROLE HCL 1 MG PO TABS: 1.0000 mg | ORAL_TABLET | Freq: Every day | ORAL | Status: DC

## 2014-02-24 MED ORDER — TRAMADOL HCL 50 MG PO TABS: 50.0000 mg | ORAL_TABLET | Freq: Three times a day (TID) | ORAL | Status: DC | PRN

## 2014-02-24 NOTE — Progress Notes (Signed)
   Subjective:    Patient ID: Allison Freeman, female    DOB: 1946-02-02, 68 y.o.   MRN: [id number]  HPI  Patient arrives to discuss recent lab results.  Patient states she is also concerned about weight gain and her hair falling out.  bp good when cked wlsewhere. Watching salt intake compliant with meds. No obvious side effects with medication.  Reflux is stable as long as she maintains medication. Trying to watch her spicy food and caffeine intake.  Compliant with lipid medicine. Exercise not as good as she had hoped. In fact pretty much nonexistent recently.  Patient is concerned that her scalp is starting to thin. Her hairdresser is pointing this out. This is a source of concern for her.  Results for orders placed in visit on 02/15/14  LIPID PANEL      Result Value Ref Range   Cholesterol 148  0 - 200 mg/dL   Triglycerides 73  <150 mg/dL   HDL 41  >39 mg/dL   Total CHOL/HDL Ratio 3.6     VLDL 15  0 - 40 mg/dL   LDL Cholesterol 92  0 - 99 mg/dL  HEPATIC FUNCTION PANEL      Result Value Ref Range   Total Bilirubin 0.6  0.2 - 1.2 mg/dL   Bilirubin, Direct 0.1  0.0 - 0.3 mg/dL   Indirect Bilirubin 0.5  0.2 - 1.2 mg/dL   Alkaline Phosphatase 46  39 - 117 U/L   AST 19  0 - 37 U/L   ALT 16  0 - 35 U/L   Total Protein 6.2  6.0 - 8.3 g/dL   Albumin 3.5  3.5 - 5.2 g/dL  BASIC METABOLIC PANEL      Result Value Ref Range   Sodium 141  135 - 145 mEq/L   Potassium 4.5  3.5 - 5.3 mEq/L   Chloride 108  96 - 112 mEq/L   CO2 28  19 - 32 mEq/L   Glucose, Bld 100 (*) 70 - 99 mg/dL   BUN 20  6 - 23 mg/dL   Creat 1.17 (*) 0.50 - 1.10 mg/dL   Calcium 8.9  8.4 - 10.5 mg/dL   Hair has been getting thin, thyr is normal as of December    Review of Systems  no chest pain no nausea no diaphoresis no night sweats ongoing weight gain no abdominal pain chronic low back pain.    Objective:   Physical Exam Alert no apparent distress. HEENT normal. Lungs clear. Heart regular in rhythm.  Abdomen benign. Ankles without edema. Skin and scalp appears normal thinning of scalp associated with aging.       Assessment & Plan:  Impression 1 hypertension good control. #2 hyperlipidemia good control. #3 reflux good control. #4 scalp thinning within normal limits for age with anxiety on part of patient. Plan encouraged to get back to regular exercise. Maintain same medications. Check every 6 months. Diet exercise discussed. Offered dermatology referral patient to think about. WSL

## 2014-02-27 ENCOUNTER — Other Ambulatory Visit: Payer: Self-pay | Admitting: Family Medicine

## 2014-02-27 DIAGNOSIS — Z1231 Encounter for screening mammogram for malignant neoplasm of breast: Secondary | ICD-10-CM

## 2014-03-08 DIAGNOSIS — D509 Iron deficiency anemia, unspecified: Secondary | ICD-10-CM | POA: Diagnosis not present

## 2014-03-21 DIAGNOSIS — M79609 Pain in unspecified limb: Secondary | ICD-10-CM | POA: Diagnosis not present

## 2014-03-21 DIAGNOSIS — M722 Plantar fascial fibromatosis: Secondary | ICD-10-CM | POA: Diagnosis not present

## 2014-04-10 ENCOUNTER — Ambulatory Visit (HOSPITAL_COMMUNITY)
Admission: RE | Admit: 2014-04-10 | Discharge: 2014-04-10 | Disposition: A | Discharge status: Home / Self Care | Payer: Medicare Other | Source: Ambulatory Visit | Attending: Family Medicine | Admitting: Family Medicine

## 2014-04-10 DIAGNOSIS — Z1231 Encounter for screening mammogram for malignant neoplasm of breast: Secondary | ICD-10-CM | POA: Diagnosis not present

## 2014-04-12 ENCOUNTER — Other Ambulatory Visit: Payer: Self-pay | Admitting: Family Medicine

## 2014-04-12 DIAGNOSIS — R928 Other abnormal and inconclusive findings on diagnostic imaging of breast: Secondary | ICD-10-CM

## 2014-04-18 DIAGNOSIS — M79609 Pain in unspecified limb: Secondary | ICD-10-CM | POA: Diagnosis not present

## 2014-04-18 DIAGNOSIS — M722 Plantar fascial fibromatosis: Secondary | ICD-10-CM | POA: Diagnosis not present

## 2014-04-25 ENCOUNTER — Ambulatory Visit (HOSPITAL_COMMUNITY)
Admission: RE | Admit: 2014-04-25 | Discharge: 2014-04-25 | Disposition: A | Discharge status: Home / Self Care | Payer: Medicare Other | Source: Ambulatory Visit | Attending: Family Medicine | Admitting: Family Medicine

## 2014-04-25 ENCOUNTER — Encounter (HOSPITAL_COMMUNITY): Payer: No Typology Code available for payment source

## 2014-04-25 DIAGNOSIS — R928 Other abnormal and inconclusive findings on diagnostic imaging of breast: Secondary | ICD-10-CM | POA: Diagnosis not present

## 2014-05-02 DIAGNOSIS — Z0289 Encounter for other administrative examinations: Secondary | ICD-10-CM

## 2014-05-16 DIAGNOSIS — M79609 Pain in unspecified limb: Secondary | ICD-10-CM | POA: Diagnosis not present

## 2014-05-16 DIAGNOSIS — M722 Plantar fascial fibromatosis: Secondary | ICD-10-CM | POA: Diagnosis not present

## 2014-06-06 DIAGNOSIS — L659 Nonscarring hair loss, unspecified: Secondary | ICD-10-CM | POA: Diagnosis not present

## 2014-06-06 DIAGNOSIS — L821 Other seborrheic keratosis: Secondary | ICD-10-CM | POA: Diagnosis not present

## 2014-06-06 DIAGNOSIS — D485 Neoplasm of uncertain behavior of skin: Secondary | ICD-10-CM | POA: Diagnosis not present

## 2014-06-06 DIAGNOSIS — I789 Disease of capillaries, unspecified: Secondary | ICD-10-CM | POA: Diagnosis not present

## 2014-06-06 DIAGNOSIS — L570 Actinic keratosis: Secondary | ICD-10-CM | POA: Diagnosis not present

## 2014-06-08 DIAGNOSIS — D509 Iron deficiency anemia, unspecified: Secondary | ICD-10-CM | POA: Diagnosis not present

## 2014-06-15 ENCOUNTER — Telehealth: Payer: Self-pay | Admitting: Family Medicine

## 2014-06-15 ENCOUNTER — Other Ambulatory Visit: Payer: Self-pay | Admitting: *Deleted

## 2014-06-15 ENCOUNTER — Other Ambulatory Visit: Payer: Self-pay | Admitting: Family Medicine

## 2014-06-15 MED ORDER — TRAMADOL HCL 50 MG PO TABS: 50.0000 mg | ORAL_TABLET | Freq: Three times a day (TID) | ORAL | Status: DC | PRN

## 2014-06-15 MED ORDER — ALPRAZOLAM 0.5 MG PO TABS: ORAL_TABLET | ORAL | Status: DC

## 2014-06-15 NOTE — Telephone Encounter (Signed)
Pt not wanting mail order she is switching these to  [facility]

## 2014-06-15 NOTE — Telephone Encounter (Signed)
traMADol (ULTRAM) 50 MG tablet ALPRAZolam (XANAX) 0.5 MG tablet  Pt would like to switch these from mail order to  [facility] please ... 90 day if possible

## 2014-06-15 NOTE — Telephone Encounter (Signed)
Pt notified on voicemail that she can have a 90 day supply of tramadol and only 30 day of xanax. meds sent to [facility].

## 2014-06-15 NOTE — Telephone Encounter (Signed)
Tram ok mail ord, xanax not, 6 mo ref both

## 2014-06-15 NOTE — Telephone Encounter (Signed)
Last seen 02/24/14.

## 2014-06-20 ENCOUNTER — Telehealth: Payer: Self-pay | Admitting: Family Medicine

## 2014-06-20 NOTE — Telephone Encounter (Signed)
Ntsw, what exact numbers is she getting

## 2014-06-20 NOTE — Telephone Encounter (Signed)
Readings on 06/19/14 were 97/63, 100/63, 107/63 and 94/65 Today's reading was 95/65.

## 2014-06-20 NOTE — Telephone Encounter (Signed)
Patient is involved with a new exercise/nutrition program. Ever since she has been in it, her blood pressure has been dropping. She wants to know should she continue taking her blood pressure medication?

## 2014-06-20 NOTE — Telephone Encounter (Signed)
Good news, stop bp med, call us in couple wks with some more numbers(prinivil)

## 2014-06-20 NOTE — Telephone Encounter (Signed)
Notified patient good news, stop bp med, call us in couple wks with some more numbers. Patient verbalized understanding.

## 2014-06-23 ENCOUNTER — Telehealth: Payer: Self-pay | Admitting: Family Medicine

## 2014-06-23 ENCOUNTER — Ambulatory Visit (INDEPENDENT_AMBULATORY_CARE_PROVIDER_SITE_OTHER): Payer: Medicare Other

## 2014-06-23 DIAGNOSIS — E785 Hyperlipidemia, unspecified: Secondary | ICD-10-CM

## 2014-06-23 DIAGNOSIS — Z23 Encounter for immunization: Secondary | ICD-10-CM

## 2014-06-23 NOTE — Telephone Encounter (Signed)
Blood work orders place in [employer]. Patient notified.

## 2014-06-23 NOTE — Telephone Encounter (Signed)
Lip liv wait til one wk or so before visit

## 2014-06-23 NOTE — Telephone Encounter (Signed)
Patient needs order for blood work for appointment in December. Please advise.

## 2014-07-22 ENCOUNTER — Other Ambulatory Visit: Payer: Self-pay | Admitting: Family Medicine

## 2014-07-24 ENCOUNTER — Other Ambulatory Visit: Payer: Self-pay

## 2014-07-25 ENCOUNTER — Telehealth: Payer: Self-pay

## 2014-07-25 MED ORDER — TRAMADOL HCL 50 MG PO TABS: 50.0000 mg | ORAL_TABLET | Freq: Three times a day (TID) | ORAL | Status: DC | PRN

## 2014-07-25 NOTE — Telephone Encounter (Signed)
Ok plus 6 mo worth total

## 2014-08-15 DIAGNOSIS — M722 Plantar fascial fibromatosis: Secondary | ICD-10-CM | POA: Diagnosis not present

## 2014-08-23 ENCOUNTER — Ambulatory Visit: Payer: Medicare Other | Admitting: Family Medicine

## 2014-08-25 ENCOUNTER — Ambulatory Visit (INDEPENDENT_AMBULATORY_CARE_PROVIDER_SITE_OTHER): Payer: Medicare Other | Admitting: Family Medicine

## 2014-08-25 ENCOUNTER — Encounter: Payer: Self-pay | Admitting: Family Medicine

## 2014-08-25 VITALS — BP 150/90 | Ht 65.0 in | Wt 163.0 lb

## 2014-08-25 DIAGNOSIS — Z79899 Other long term (current) drug therapy: Secondary | ICD-10-CM

## 2014-08-25 DIAGNOSIS — E7800 Pure hypercholesterolemia, unspecified: Secondary | ICD-10-CM

## 2014-08-25 MED ORDER — AMOXICILLIN 500 MG PO CAPS: 500.0000 mg | ORAL_CAPSULE | Freq: Three times a day (TID) | ORAL | Status: DC

## 2014-08-25 NOTE — Progress Notes (Signed)
   Subjective:    Patient ID: Allison Freeman, female    DOB: Jan 31, 1946, 68 y.o.   MRN: [id number]  HPI Patient is here today for a 6 month check up.  She was supposed to get BW done before this visit, but she forgot.  Drinking one shake per day  Fairly good bp control  Walking al ot with child care  Recent cong and dranage and fullness right ear. Head full also.no known exposure. Got flu shot and pneum shot   She does not need a refill on any meds.  C/o sinus pressure and ear pain. Right ear worse than left.  Getting some walking in on nice days.   Reflux aggravating at titmes. Compliant with medications.   Compliant with lipid medicine. No obvious side effects.  Still off blood pressure medicine. Most blood pressures at home running in good control.    Review of Systems No headache no chest pain back pain other than chronic no change in bowel habits no blood in stool ROS otherwise negative    Objective:   Physical Exam  Alert vital stable blood pressure repeat 136/84. Lungs clear. Heart regular in rhythm. Ankles without edema.      Assessment & Plan:  Impression 1 hypertension good control #2 chronic pain ongoing #3 hyperlipidemia status uncertain #4 sinusitis plan antibiotics prescribed. Symptomatic care discussed. Appropriate blood work. Recheck in 6 months. WSL

## 2014-09-07 DIAGNOSIS — E780 Pure hypercholesterolemia: Secondary | ICD-10-CM | POA: Diagnosis not present

## 2014-09-07 DIAGNOSIS — Z79899 Other long term (current) drug therapy: Secondary | ICD-10-CM | POA: Diagnosis not present

## 2014-09-07 LAB — LIPID PANEL
CHOL/HDL RATIO: 3.6 ratio
CHOLESTEROL: 166 mg/dL (ref 0–200)
HDL: 46 mg/dL (ref >39)
LDL Cholesterol: 99 mg/dL (ref 0–99)
TRIGLYCERIDES: 105 mg/dL (ref <150)
VLDL: 21 mg/dL (ref 0–40)

## 2014-09-07 LAB — HEPATIC FUNCTION PANEL
ALT: 16 U/L (ref 0–35)
AST: 22 U/L (ref 0–37)
Albumin: 3.9 g/dL (ref 3.5–5.2)
Alkaline Phosphatase: 51 U/L (ref 39–117)
BILIRUBIN DIRECT: 0.2 mg/dL (ref 0.0–0.3)
BILIRUBIN INDIRECT: 1.2 mg/dL (ref 0.2–1.2)
Total Bilirubin: 1.4 mg/dL — ABNORMAL HIGH (ref 0.2–1.2)
Total Protein: 6.8 g/dL (ref 6.0–8.3)

## 2014-09-11 ENCOUNTER — Encounter: Payer: Self-pay | Admitting: Family Medicine

## 2014-09-13 DIAGNOSIS — Z01419 Encounter for gynecological examination (general) (routine) without abnormal findings: Secondary | ICD-10-CM | POA: Diagnosis not present

## 2014-10-11 ENCOUNTER — Other Ambulatory Visit: Payer: Self-pay | Admitting: Family Medicine

## 2014-10-12 ENCOUNTER — Other Ambulatory Visit: Payer: Self-pay | Admitting: *Deleted

## 2014-10-12 MED ORDER — ALPRAZOLAM 0.5 MG PO TABS: ORAL_TABLET | ORAL | Status: DC

## 2014-10-12 NOTE — Telephone Encounter (Signed)
Okay 5 refills

## 2014-10-16 ENCOUNTER — Other Ambulatory Visit: Payer: Self-pay | Admitting: *Deleted

## 2014-10-16 ENCOUNTER — Telehealth: Payer: Self-pay | Admitting: Family Medicine

## 2014-10-16 MED ORDER — CIPROFLOXACIN HCL 250 MG PO TABS: 250.0000 mg | ORAL_TABLET | Freq: Two times a day (BID) | ORAL | Status: DC

## 2014-10-16 NOTE — Telephone Encounter (Signed)
Symptoms started about 1 week ago. Low abd pain, no fever, dysuria, frequent urination and low back pain.

## 2014-10-16 NOTE — Telephone Encounter (Signed)
Pt has taken 3 test at home for UTI and it states she  Is positive for UTI, she has taken the AZO as well  Symptoms, lower abd pain, burning with urination, frequency  She would like to have something called in, advised pt we  Ask for them to come in for an appt but insisted I request for the Call in instead.   [facility]

## 2014-10-16 NOTE — Telephone Encounter (Signed)
Ntsw, home tests not reliable enough to practice med with, will do this time with rotten weather. cipro 250 bid 5 d

## 2014-10-16 NOTE — Telephone Encounter (Signed)
Discussed with pt that home test not reliable and due to the weather antibiotic will be sent to pharm. Pt advised to follow up in office if symptoms not cleared up after finishing antibiotic or if worse. Med sent to [facility].

## 2014-11-20 ENCOUNTER — Other Ambulatory Visit: Payer: Self-pay | Admitting: Family Medicine

## 2014-11-21 ENCOUNTER — Other Ambulatory Visit: Payer: Self-pay | Admitting: *Deleted

## 2014-11-21 ENCOUNTER — Telehealth: Payer: Self-pay | Admitting: *Deleted

## 2014-11-21 MED ORDER — TRAMADOL HCL 50 MG PO TABS: 50.0000 mg | ORAL_TABLET | Freq: Three times a day (TID) | ORAL | Status: DC | PRN

## 2014-11-23 ENCOUNTER — Ambulatory Visit (INDEPENDENT_AMBULATORY_CARE_PROVIDER_SITE_OTHER): Payer: Medicare Other | Admitting: Otolaryngology

## 2014-11-23 DIAGNOSIS — H903 Sensorineural hearing loss, bilateral: Secondary | ICD-10-CM

## 2014-11-23 DIAGNOSIS — H6983 Other specified disorders of Eustachian tube, bilateral: Secondary | ICD-10-CM

## 2014-11-23 DIAGNOSIS — J343 Hypertrophy of nasal turbinates: Secondary | ICD-10-CM

## 2014-11-23 DIAGNOSIS — J342 Deviated nasal septum: Secondary | ICD-10-CM | POA: Diagnosis not present

## 2014-12-27 DIAGNOSIS — L659 Nonscarring hair loss, unspecified: Secondary | ICD-10-CM | POA: Diagnosis not present

## 2014-12-27 DIAGNOSIS — L819 Disorder of pigmentation, unspecified: Secondary | ICD-10-CM | POA: Diagnosis not present

## 2014-12-27 DIAGNOSIS — L570 Actinic keratosis: Secondary | ICD-10-CM | POA: Diagnosis not present

## 2014-12-28 ENCOUNTER — Ambulatory Visit (INDEPENDENT_AMBULATORY_CARE_PROVIDER_SITE_OTHER): Payer: Medicare Other | Admitting: Otolaryngology

## 2014-12-28 ENCOUNTER — Other Ambulatory Visit: Payer: Self-pay | Admitting: *Deleted

## 2014-12-28 DIAGNOSIS — H903 Sensorineural hearing loss, bilateral: Secondary | ICD-10-CM | POA: Diagnosis not present

## 2014-12-28 DIAGNOSIS — H6983 Other specified disorders of Eustachian tube, bilateral: Secondary | ICD-10-CM | POA: Diagnosis not present

## 2014-12-28 MED ORDER — ALPRAZOLAM 0.5 MG PO TABS: ORAL_TABLET | ORAL | Status: DC

## 2014-12-28 NOTE — Telephone Encounter (Signed)
May do this rf and 4 additional

## 2015-02-06 DIAGNOSIS — H3530 Unspecified macular degeneration: Secondary | ICD-10-CM | POA: Diagnosis not present

## 2015-02-06 DIAGNOSIS — H5203 Hypermetropia, bilateral: Secondary | ICD-10-CM | POA: Diagnosis not present

## 2015-02-06 DIAGNOSIS — H524 Presbyopia: Secondary | ICD-10-CM | POA: Diagnosis not present

## 2015-02-06 DIAGNOSIS — H52222 Regular astigmatism, left eye: Secondary | ICD-10-CM | POA: Diagnosis not present

## 2015-02-22 ENCOUNTER — Ambulatory Visit (INDEPENDENT_AMBULATORY_CARE_PROVIDER_SITE_OTHER): Payer: Medicare Other | Admitting: Family Medicine

## 2015-02-22 ENCOUNTER — Encounter: Payer: Self-pay | Admitting: Family Medicine

## 2015-02-22 VITALS — BP 120/70 | Ht 65.0 in | Wt 154.1 lb

## 2015-02-22 DIAGNOSIS — E785 Hyperlipidemia, unspecified: Secondary | ICD-10-CM | POA: Diagnosis not present

## 2015-02-22 DIAGNOSIS — I10 Essential (primary) hypertension: Secondary | ICD-10-CM

## 2015-02-22 DIAGNOSIS — G5602 Carpal tunnel syndrome, left upper limb: Secondary | ICD-10-CM

## 2015-02-22 DIAGNOSIS — Z79899 Other long term (current) drug therapy: Secondary | ICD-10-CM

## 2015-02-22 DIAGNOSIS — G5603 Carpal tunnel syndrome, bilateral upper limbs: Secondary | ICD-10-CM

## 2015-02-22 DIAGNOSIS — G5601 Carpal tunnel syndrome, right upper limb: Secondary | ICD-10-CM

## 2015-02-22 MED ORDER — ALPRAZOLAM 0.5 MG PO TABS: ORAL_TABLET | ORAL | Status: DC

## 2015-02-22 MED ORDER — TRAMADOL HCL 50 MG PO TABS: 50.0000 mg | ORAL_TABLET | Freq: Three times a day (TID) | ORAL | Status: DC | PRN

## 2015-02-22 NOTE — Progress Notes (Signed)
   Subjective:    Patient ID: Allison Freeman, female    DOB: [birth year], 69 y.o.   MRN: [id number]  Hyperlipidemia This is a chronic problem. The current episode started more than 1 year ago. There are no known factors aggravating her hyperlipidemia. Current antihyperlipidemic treatment includes statins. The current treatment provides moderate improvement of lipids. There are no compliance problems.  There are no known risk factors for coronary artery disease.   Patient states that she has no new concerns at this time.   bp numbers generally good when cked wlsewhere  Walking staying active,  Walks maybe once per wk  Watching fats in the diet, doing well, cutting back on drinks  Compliant with lipid medication. No obvious side effects.  wearoing the night time splint for the cts  Already saw spe cialist  In the pa. Actually patient just saw a neurologist for nerve conduction studies which confirmed carpal tunnel syndrome.  cts has now progressed to more of a problem, having difficulty sleepin g Review of Systems No headache no chest pain some chronic arthritis pain no change in bowel habits no blood in stool no rash    Objective:   Physical Exam  Alert vital stable no acute distress H&T normal. Lungs clear heart rare rhythm. Positive Tinel sign positive Phalen's sign. Ankles without substantial edema      Assessment & Plan:  Impression 1 hypertension good control #2 hyperlipidemia status uncertain #3 progressive carpal tunnel syndrome discussed #4 reflux #5 chronic pain treated with Ultram plan medications refilled. Appropriate blood work. Orthopedic referral for carpal tunnel syndrome. WSL

## 2015-03-02 DIAGNOSIS — Z79899 Other long term (current) drug therapy: Secondary | ICD-10-CM | POA: Diagnosis not present

## 2015-03-02 DIAGNOSIS — E785 Hyperlipidemia, unspecified: Secondary | ICD-10-CM | POA: Diagnosis not present

## 2015-03-03 LAB — LIPID PANEL
CHOLESTEROL TOTAL: 168 mg/dL (ref 100–199)
Chol/HDL Ratio: 3.2 ratio units (ref 0.0–4.4)
HDL: 53 mg/dL (ref >39)
LDL Calculated: 98 mg/dL (ref 0–99)
Triglycerides: 85 mg/dL (ref 0–149)
VLDL Cholesterol Cal: 17 mg/dL (ref 5–40)

## 2015-03-03 LAB — HEPATIC FUNCTION PANEL
ALT: 17 IU/L (ref 0–32)
AST: 23 IU/L (ref 0–40)
Albumin: 4 g/dL (ref 3.6–4.8)
Alkaline Phosphatase: 56 IU/L (ref 39–117)
BILIRUBIN TOTAL: 0.8 mg/dL (ref 0.0–1.2)
Bilirubin, Direct: 0.19 mg/dL (ref 0.00–0.40)
TOTAL PROTEIN: 6.3 g/dL (ref 6.0–8.5)

## 2015-03-03 LAB — BASIC METABOLIC PANEL
BUN / CREAT RATIO: 17 (ref 11–26)
BUN: 18 mg/dL (ref 8–27)
CO2: 27 mmol/L (ref 18–29)
CREATININE: 1.05 mg/dL — AB (ref 0.57–1.00)
Calcium: 9.4 mg/dL (ref 8.7–10.3)
Chloride: 103 mmol/L (ref 97–108)
GFR calc Af Amer: 64 mL/min/{1.73_m2} (ref >59)
GFR, EST NON AFRICAN AMERICAN: 55 mL/min/{1.73_m2} — AB (ref >59)
GLUCOSE: 94 mg/dL (ref 65–99)
Potassium: 5 mmol/L (ref 3.5–5.2)
SODIUM: 141 mmol/L (ref 134–144)

## 2015-03-04 ENCOUNTER — Encounter: Payer: Self-pay | Admitting: Family Medicine

## 2015-03-09 ENCOUNTER — Ambulatory Visit (INDEPENDENT_AMBULATORY_CARE_PROVIDER_SITE_OTHER): Payer: Medicare Other | Admitting: Urology

## 2015-03-09 DIAGNOSIS — N3941 Urge incontinence: Secondary | ICD-10-CM | POA: Diagnosis not present

## 2015-03-09 DIAGNOSIS — R350 Frequency of micturition: Secondary | ICD-10-CM | POA: Diagnosis not present

## 2015-03-13 ENCOUNTER — Encounter: Payer: Self-pay | Admitting: Family Medicine

## 2015-03-13 ENCOUNTER — Ambulatory Visit (INDEPENDENT_AMBULATORY_CARE_PROVIDER_SITE_OTHER): Payer: Medicare Other | Admitting: Family Medicine

## 2015-03-13 VITALS — BP 124/80 | Ht 65.0 in | Wt 155.0 lb

## 2015-03-13 DIAGNOSIS — H9201 Otalgia, right ear: Secondary | ICD-10-CM

## 2015-03-13 MED ORDER — PREDNISONE 10 MG PO TABS: ORAL_TABLET | ORAL | Status: DC

## 2015-03-13 NOTE — Progress Notes (Signed)
   Subjective:    Patient ID: Allison Freeman, female    DOB: 08/02/1946, 69 y.o.   MRN: [id number]  Cough This is a new problem. Episode onset: 2 days. Associated symptoms include ear pain, headaches and a sore throat. She has tried nothing for the symptoms.    More cough and runny nsose  No vomiting no diarrhea. On further history may problem is difficulty opening jaw with pain in right ear/jaw. Next  Patient does chew gum. Next  No recent dental work  Review of Systems  HENT: Positive for ear pain and sore throat.   Respiratory: Positive for cough.   Neurological: Positive for headaches.       Objective:   Physical Exam Vitals stable lungs clear heart rare rhythm right TMJ distinct we tender with extension enclosing tympanic membrane normal pharynx normal       Assessment & Plan:  Impression right TMJ pain plan prednisone taper. Symptom care discussed. Warning signs discussed WSL

## 2015-03-19 ENCOUNTER — Other Ambulatory Visit: Payer: Self-pay | Admitting: Family Medicine

## 2015-03-19 DIAGNOSIS — Z1231 Encounter for screening mammogram for malignant neoplasm of breast: Secondary | ICD-10-CM

## 2015-03-27 ENCOUNTER — Ambulatory Visit (INDEPENDENT_AMBULATORY_CARE_PROVIDER_SITE_OTHER): Payer: Medicare Other | Admitting: Orthopedic Surgery

## 2015-03-27 ENCOUNTER — Encounter: Payer: Self-pay | Admitting: Orthopedic Surgery

## 2015-03-27 VITALS — BP 150/75 | Ht 65.0 in | Wt 155.0 lb

## 2015-03-27 DIAGNOSIS — G5601 Carpal tunnel syndrome, right upper limb: Secondary | ICD-10-CM | POA: Diagnosis not present

## 2015-03-27 NOTE — Patient Instructions (Signed)
SURGERY 04/06/15 RIGHT CARPAL TUNNEL RELEASE    Carpal Tunnel Release Carpal tunnel release is done to relieve the pressure on the nerves and tendons on the bottom side of your wrist.  LET YOUR CAREGIVER KNOW ABOUT:   Allergies to food or medicine.  Medicines taken, including vitamins, herbs, eyedrops, over-the-counter medicines, and creams.  Use of steroids (by mouth or creams).  Previous problems with anesthetics or numbing medicines.  History of bleeding problems or blood clots.  Previous surgery.  Other health problems, including diabetes and kidney problems.  Possibility of pregnancy, if this applies. RISKS AND COMPLICATIONS  Some problems that may happen after this procedure include:  Infection.  Damage to the nerves, arteries or tendons could occur. This would be very uncommon.  Bleeding. BEFORE THE PROCEDURE   This surgery may be done while you are asleep (general anesthetic) or may be done under a block where only your forearm and the surgical area is numb.  If the surgery is done under a block, the numbness will gradually wear off within several hours after surgery. HOME CARE INSTRUCTIONS   Have a responsible person with you for 24 hours.  Do not drive a car or use public transportation for 24 hours.  Only take over-the-counter or prescription medicines for pain, discomfort, or fever as directed by your caregiver. Take them as directed.  You may put ice on the palm side of the affected wrist.  Put ice in a plastic bag.  Place a towel between your skin and the bag.  Leave the ice on for 20 to 30 minutes, 4 times per day.  If you were given a splint to keep your wrist from bending, use it as directed. It is important to wear the splint at night or as directed. Use the splint for as long as you have pain or numbness in your hand, arm, or wrist. This may take 1 to 2 months.  Keep your hand raised (elevated) above the level of your heart as much as possible.  This keeps swelling down and helps with discomfort.  Change bandages (dressings) as directed.  Keep the wound clean and dry. SEEK MEDICAL CARE IF:   You develop pain not relieved with medications.  You develop numbness of your hand.  You develop bleeding from your surgical site.  You have an oral temperature above 102 F (38.9 C).  You develop redness or swelling of the surgical site.  You develop new, unexplained problems. SEEK IMMEDIATE MEDICAL CARE IF:   You develop a rash.  You have difficulty breathing.  You develop any reaction or side effects to medications given. Document Released: 11/29/2003 Document Revised: 12/01/2011 Document Reviewed: 07/15/2007 [facility] Patient Information 2015 [place], [place]. This information is not intended to replace advice given to you by your health care provider. Make sure you discuss any questions you have with your health care provider.

## 2015-03-27 NOTE — Progress Notes (Signed)
Patient ID: Allison Freeman, female   DOB: April 15, 1946, 69 y.o.   MRN: [id number] Patient ID: Allison Freeman, female   DOB: Feb 21, 1946, 69 y.o.   MRN: [id number] New problem    Chief Complaint  Patient presents with  . Carpal Tunnel    Bilateral CTS, Referred by [facility] Apo. NCS by Dr. Merlene Laughter.     Allison Freeman is a 69 y.o. female.   HPI This is a 69 year old female with a new problem of bilateral pain tingling numbness in both hands right worse than left. She has aching night pain is constant she has failed conservative treatment with splinting. Her pain is 7 out of 10. She says she can't sleep at night.  Review of systems ring of your sinusitis frequent urination excessive urination joint pain muscle weakness stiff joints back pain. Review of Systems See hpi  Past Medical History  Diagnosis Date  . Acid reflux   . Hypertension   . Hypercholesterolemia   . Arthritis   . Depression   . Panic attacks   . COPD (chronic obstructive pulmonary disease)     Mild  . Reflux   . Insomnia   . Osteopenia   . Restless leg syndrome     Past Surgical History  Procedure Laterality Date  . Tonsillectomy    . Rectal surgery    . Bladder surgery    . Abdominal hysterectomy  2001  . Knee surgery  2003  . Dilation and curettage of uterus    . Cystocele repair  2010  . Rectocele repair  2010  . Breast biopsy  1987  . Breast cyst excision  1998  . Colonoscopy  09/19/2004    [id number] hemorrhoids, otherwise normal rectum/Sigmoid diverticula.  Remainder of colon mucosa appeared normal  . Colonoscopy with esophagogastroduodenoscopy (egd) N/A 06/24/2013    WYO:[id number] polyp-removed. Small hiatal hernia. Abnormal gastric mucosa-query NSAID effect-s/p (chronic inactive gastritis, negative H. pylori)/TCS: Prominent internal hemorrhoids-likely source of hematochezia. Colonic diverticulosis. Colonic polyp-removed (tubular adenoma)    Family History  Problem Relation Age of Onset   . Cancer Mother     Breast  . Hypertension Father   . Heart attack Father   . Cancer Sister     Breast  . Cancer Sister     breast  . Colon cancer Neg Hx     Social History History  Substance Use Topics  . Smoking status: Never Smoker   . Smokeless tobacco: Not on file  . Alcohol Use: No    Allergies  Allergen Reactions  . Other     Arthritis medications- causes a bacteria in the abdomen    Current Outpatient Prescriptions  Medication Sig Dispense Refill  . ALPRAZolam (XANAX) 0.5 MG tablet TAKE ONE TABLET BY MOUTH AT BEDTIME AS NEEDED 30 tablet 5  . Artificial Tear Solution (SOOTHE XP OP) Apply to eye.    Marland Kitchen aspirin 81 MG tablet Take 81 mg by mouth daily.    . Calcium Carbonate (CALCIUM 600 PO) Take by mouth.    . fluticasone (FLONASE) 50 MCG/ACT nasal spray INSTILL TWO SPRAYS INTO EACH NOSTRIL ONCE DAILY 48 g 5  . lisinopril (PRINIVIL,ZESTRIL) 10 MG tablet Take 1 tablet by mouth  daily 90 tablet 0  . MAGNESIUM PO Take by mouth.    . Multiple Vitamin (MULTIVITAMIN) tablet Take 1 tablet by mouth daily.    . Omega-3 Fatty Acids (FISH OIL) 1200 MG CAPS Take 1 capsule by mouth daily.    Marland Kitchen  oxybutynin (DITROPAN) 5 MG tablet Take 5 mg by mouth 3 (three) times daily.    . pantoprazole (PROTONIX) 40 MG tablet Take 1 tablet by mouth  twice a day before a meal 180 tablet 0  . pravastatin (PRAVACHOL) 40 MG tablet Take 1 tablet by mouth  daily 90 tablet 1  . predniSONE (DELTASONE) 10 MG tablet Three qd for three d, two qd for two days 15 tablet 0  . Psyllium (METAMUCIL PO) Take by mouth.    Marland Kitchen rOPINIRole (REQUIP) 1 MG tablet Take 1 tablet by mouth  daily 90 tablet 0  . Thiamine HCl (B-1 PO) Take by mouth.    . traMADol (ULTRAM) 50 MG tablet Take 1 tablet (50 mg total) by mouth every 8 (eight) hours as needed. 180 tablet 2   No current facility-administered medications for this visit.       Physical Exam Blood pressure 150/75, height 5\' 5"  (1.651 m), weight 155 lb (70.308  kg). Physical Exam The patient is well developed well nourished and well groomed. Orientation to person place and time is normal  Mood is pleasant. Right hand. Tenderness over the carpal tunnel no swelling she does have Heberden's and Bouchard's nodes in the small joints. Range of motion is normal wrist and hand. Wrist is stable. Motor exam normal. Skin normal. Vascular exam normal. Decreased sensation median nerve distribution right hand lymph nodes negative  Data Reviewed Nerve conduction studies from [facility] neurology shows bilateral carpal tunnel disease moderate  Assessment Right carpal tunnel syndrome Plan Right carpal tunnel release risks and benefits explained

## 2015-03-27 NOTE — Addendum Note (Signed)
Addended by: Baldomero Lamy B on: 03/27/2015 11:55 AM   Modules accepted: Orders

## 2015-04-03 ENCOUNTER — Ambulatory Visit (INDEPENDENT_AMBULATORY_CARE_PROVIDER_SITE_OTHER): Payer: Medicare Other | Admitting: Family Medicine

## 2015-04-03 ENCOUNTER — Encounter: Payer: Self-pay | Admitting: Family Medicine

## 2015-04-03 VITALS — BP 100/70 | Temp 97.8°F | Ht 65.0 in | Wt 155.1 lb

## 2015-04-03 DIAGNOSIS — J040 Acute laryngitis: Secondary | ICD-10-CM

## 2015-04-03 DIAGNOSIS — J029 Acute pharyngitis, unspecified: Secondary | ICD-10-CM | POA: Diagnosis not present

## 2015-04-03 LAB — POCT RAPID STREP A (OFFICE): Rapid Strep A Screen: NEGATIVE

## 2015-04-03 MED ORDER — AMOXICILLIN 500 MG PO CAPS: 500.0000 mg | ORAL_CAPSULE | Freq: Three times a day (TID) | ORAL | Status: DC

## 2015-04-03 NOTE — Progress Notes (Signed)
   Subjective:    Patient ID: LEXY MEININGER, female    DOB: [birth year], 69 y.o.   MRN: [id number]  Sore Throat  This is a new problem. The current episode started yesterday. The problem has been unchanged. Neither side of throat is experiencing more pain than the other. There has been no fever. The pain is mild. Treatments tried: allergy medications  The treatment provided no relief.   Patient states that she has no other concerns.   Irritated and inflammed fir thing in the morn Having ortho surg on fri , carpal tunnel syndrome right wrist alert vitals  cts  Took allergy meds ,  No known exposure to sickness. Review of Systems No headache some sore throat intermittent cough    Objective:   Physical Exam  Alert vitals stable HEENT slight erythema pharynx. Worse voice occasional cough neck supple lungs clear heart regular in rhythm      Assessment & Plan:  Impression post viral laryngitis plan symptom care discussed and I bites prescribed. WSL

## 2015-04-03 NOTE — Patient Instructions (Signed)
Allison Freeman  04/03/2015     @PREFPERIOPPHARMACY @   Your procedure is scheduled on 04/06/2015  Report to [facility] at 8:50 A.M.  Call this number if you have problems the morning of surgery:  [phone]   Remember:  Do not eat food or drink liquids after midnight.  Take these medicines the morning of surgery with A SIP OF WATER Xanax, Flonase, Lisinopril, Ditropan, Protonix, Requip   Do not wear jewelry, make-up or nail polish.  Do not wear lotions, powders, or perfumes.  You may wear deodorant.  Do not shave 48 hours prior to surgery.  Men may shave face and neck.  Do not bring valuables to the hospital.  [facility] is not responsible for any belongings or valuables.  Contacts, dentures or bridgework may not be worn into surgery.  Leave your suitcase in the car.  After surgery it may be brought to your room.  For patients admitted to the hospital, discharge time will be determined by your treatment team.  Patients discharged the day of surgery will not be allowed to drive home.    Please read over the following fact sheets that you were given. Surgical Site Infection Prevention and Anesthesia Post-op Instructions     PATIENT INSTRUCTIONS POST-ANESTHESIA  IMMEDIATELY FOLLOWING SURGERY:  Do not drive or operate machinery for the first twenty four hours after surgery.  Do not make any important decisions for twenty four hours after surgery or while taking narcotic pain medications or sedatives.  If you develop intractable nausea and vomiting or a severe headache please notify your doctor immediately.  FOLLOW-UP:  Please make an appointment with your surgeon as instructed. You do not need to follow up with anesthesia unless specifically instructed to do so.  WOUND CARE INSTRUCTIONS (if applicable):  Keep a dry clean dressing on the anesthesia/puncture wound site if there is drainage.  Once the wound has quit draining you may leave it open to air.  Generally you should  leave the bandage intact for twenty four hours unless there is drainage.  If the epidural site drains for more than 36-48 hours please call the anesthesia department.  QUESTIONS?:  Please feel free to call your physician or the hospital operator if you have any questions, and they will be happy to assist you.      Carpal Tunnel Release Carpal tunnel release is done to relieve the pressure on the nerves and tendons on the bottom side of your wrist.  LET YOUR CAREGIVER KNOW ABOUT:   Allergies to food or medicine.  Medicines taken, including vitamins, herbs, eyedrops, over-the-counter medicines, and creams.  Use of steroids (by mouth or creams).  Previous problems with anesthetics or numbing medicines.  History of bleeding problems or blood clots.  Previous surgery.  Other health problems, including diabetes and kidney problems.  Possibility of pregnancy, if this applies. RISKS AND COMPLICATIONS  Some problems that may happen after this procedure include:  Infection.  Damage to the nerves, arteries or tendons could occur. This would be very uncommon.  Bleeding. BEFORE THE PROCEDURE   This surgery may be done while you are asleep (general anesthetic) or may be done under a block where only your forearm and the surgical area is numb.  If the surgery is done under a block, the numbness will gradually wear off within several hours after surgery. HOME CARE INSTRUCTIONS   Have a responsible person with you for 24 hours.  Do not drive a car or  use public transportation for 24 hours.  Only take over-the-counter or prescription medicines for pain, discomfort, or fever as directed by your caregiver. Take them as directed.  You may put ice on the palm side of the affected wrist.  Put ice in a plastic bag.  Place a towel between your skin and the bag.  Leave the ice on for 20 to 30 minutes, 4 times per day.  If you were given a splint to keep your wrist from bending, use it as  directed. It is important to wear the splint at night or as directed. Use the splint for as long as you have pain or numbness in your hand, arm, or wrist. This may take 1 to 2 months.  Keep your hand raised (elevated) above the level of your heart as much as possible. This keeps swelling down and helps with discomfort.  Change bandages (dressings) as directed.  Keep the wound clean and dry. SEEK MEDICAL CARE IF:   You develop pain not relieved with medications.  You develop numbness of your hand.  You develop bleeding from your surgical site.  You have an oral temperature above 102 F (38.9 C).  You develop redness or swelling of the surgical site.  You develop new, unexplained problems. SEEK IMMEDIATE MEDICAL CARE IF:   You develop a rash.  You have difficulty breathing.  You develop any reaction or side effects to medications given. Document Released: 11/29/2003 Document Revised: 12/01/2011 Document Reviewed: 07/15/2007 [facility] Patient Information 2015 [place], [place]. This information is not intended to replace advice given to you by your health care provider. Make sure you discuss any questions you have with your health care provider.

## 2015-04-03 NOTE — H&P (Signed)
Chief Complaint   Patient presents with   .  Carpal Tunnel       Bilateral CTS, Referred by [facility] Apo. NCS by Dr. Merlene Laughter.      Allison Freeman is a 69 y.o. female.   HPI This is a 69 year old female with a new problem of bilateral pain tingling numbness in both hands right worse than left. She has aching night pain is constant she has failed conservative treatment with splinting. Her pain is 7 out of 10. She says she can't sleep at night.  Review of systems ring of your sinusitis frequent urination excessive urination joint pain muscle weakness stiff joints back pain. Review of Systems See hpi    Past Medical History   Diagnosis  Date   .  Acid reflux     .  Hypertension     .  Hypercholesterolemia     .  Arthritis     .  Depression     .  Panic attacks     .  COPD (chronic obstructive pulmonary disease)         Mild   .  Reflux     .  Insomnia     .  Osteopenia     .  Restless leg syndrome         Past Surgical History   Procedure  Laterality  Date   .  Tonsillectomy       .  Rectal surgery       .  Bladder surgery       .  Abdominal hysterectomy    2001   .  Knee surgery    2003   .  Dilation and curettage of uterus       .  Cystocele repair    2010   .  Rectocele repair    2010   .  Breast biopsy    1987   .  Breast cyst excision    1998   .  Colonoscopy    09/19/2004       ZYY:QMGNOIBB hemorrhoids, otherwise normal rectum/Sigmoid diverticula.  Remainder of colon mucosa appeared normal   .  Colonoscopy with esophagogastroduodenoscopy (egd)  N/A  06/24/2013       CWU:GQBVQXI polyp-removed. Small hiatal hernia. Abnormal gastric mucosa-query NSAID effect-s/p (chronic inactive gastritis, negative H. pylori)/TCS: Prominent internal hemorrhoids-likely source of hematochezia. Colonic diverticulosis. Colonic polyp-removed (tubular adenoma)       Family History   Problem  Relation  Age of Onset   .  Cancer  Mother         Breast   .  Hypertension  Father      .  Heart attack  Father     .  Cancer  Sister         Breast   .  Cancer  Sister         breast   .  Colon cancer  Neg Hx       Social History History   Substance Use Topics   .  Smoking status:  Never Smoker    .  Smokeless tobacco:  Not on file   .  Alcohol Use:  No       Allergies   Allergen  Reactions   .  Other         Arthritis medications- causes a bacteria in the abdomen       Current Outpatient Prescriptions   Medication  Sig  Dispense  Refill   .  ALPRAZolam (XANAX) 0.5 MG tablet  TAKE ONE TABLET BY MOUTH AT BEDTIME AS NEEDED  30 tablet  5   .  Artificial Tear Solution (SOOTHE XP OP)  Apply to eye.       Marland Kitchen  aspirin 81 MG tablet  Take 81 mg by mouth daily.       .  Calcium Carbonate (CALCIUM 600 PO)  Take by mouth.       .  fluticasone (FLONASE) 50 MCG/ACT nasal spray  INSTILL TWO SPRAYS INTO EACH NOSTRIL ONCE DAILY  48 g  5   .  lisinopril (PRINIVIL,ZESTRIL) 10 MG tablet  Take 1 tablet by mouth  daily  90 tablet  0   .  MAGNESIUM PO  Take by mouth.       .  Multiple Vitamin (MULTIVITAMIN) tablet  Take 1 tablet by mouth daily.       .  Omega-3 Fatty Acids (FISH OIL) 1200 MG CAPS  Take 1 capsule by mouth daily.       Marland Kitchen  oxybutynin (DITROPAN) 5 MG tablet  Take 5 mg by mouth 3 (three) times daily.       .  pantoprazole (PROTONIX) 40 MG tablet  Take 1 tablet by mouth  twice a day before a meal  180 tablet  0   .  pravastatin (PRAVACHOL) 40 MG tablet  Take 1 tablet by mouth  daily  90 tablet  1   .  predniSONE (DELTASONE) 10 MG tablet  Three qd for three d, two qd for two days  15 tablet  0   .  Psyllium (METAMUCIL PO)  Take by mouth.       Marland Kitchen  rOPINIRole (REQUIP) 1 MG tablet  Take 1 tablet by mouth  daily  90 tablet  0   .  Thiamine HCl (B-1 PO)  Take by mouth.       .  traMADol (ULTRAM) 50 MG tablet  Take 1 tablet (50 mg total) by mouth every 8 (eight) hours as needed.  180 tablet  2      No current facility-administered medications for this visit.         Physical Exam Blood pressure 150/75, height 5\' 5"  (1.651 m), weight 155 lb (70.308 kg). Physical Exam The patient is well developed well nourished and well groomed. Orientation to person place and time is normal   Mood is pleasant. Right hand. Tenderness over the carpal tunnel no swelling she does have Heberden's and Bouchard's nodes in the small joints. Range of motion is normal wrist and hand. Wrist is stable. Motor exam normal. Skin normal. Vascular exam normal. Decreased sensation median nerve distribution right hand lymph nodes negative  Data Reviewed Nerve conduction studies from [facility] neurology shows bilateral carpal tunnel disease moderate  Assessment Right carpal tunnel syndrome Plan Right carpal tunnel release risks and benefits explained

## 2015-04-04 ENCOUNTER — Encounter (HOSPITAL_COMMUNITY): Payer: Self-pay

## 2015-04-04 ENCOUNTER — Encounter (HOSPITAL_COMMUNITY)
Admission: RE | Admit: 2015-04-04 | Discharge: 2015-04-04 | Disposition: A | Discharge status: Home / Self Care | Payer: Medicare Other | Source: Ambulatory Visit | Attending: Orthopedic Surgery | Admitting: Orthopedic Surgery

## 2015-04-04 ENCOUNTER — Other Ambulatory Visit: Payer: Self-pay

## 2015-04-04 DIAGNOSIS — E780 Pure hypercholesterolemia: Secondary | ICD-10-CM | POA: Diagnosis not present

## 2015-04-04 DIAGNOSIS — G4700 Insomnia, unspecified: Secondary | ICD-10-CM | POA: Diagnosis not present

## 2015-04-04 DIAGNOSIS — Z79899 Other long term (current) drug therapy: Secondary | ICD-10-CM | POA: Diagnosis not present

## 2015-04-04 DIAGNOSIS — G5601 Carpal tunnel syndrome, right upper limb: Secondary | ICD-10-CM | POA: Diagnosis not present

## 2015-04-04 DIAGNOSIS — I10 Essential (primary) hypertension: Secondary | ICD-10-CM | POA: Diagnosis not present

## 2015-04-04 DIAGNOSIS — F419 Anxiety disorder, unspecified: Secondary | ICD-10-CM | POA: Diagnosis not present

## 2015-04-04 DIAGNOSIS — G2581 Restless legs syndrome: Secondary | ICD-10-CM | POA: Diagnosis not present

## 2015-04-04 DIAGNOSIS — M1990 Unspecified osteoarthritis, unspecified site: Secondary | ICD-10-CM | POA: Diagnosis not present

## 2015-04-04 DIAGNOSIS — K219 Gastro-esophageal reflux disease without esophagitis: Secondary | ICD-10-CM | POA: Diagnosis not present

## 2015-04-04 DIAGNOSIS — M8580 Other specified disorders of bone density and structure, unspecified site: Secondary | ICD-10-CM | POA: Diagnosis not present

## 2015-04-04 DIAGNOSIS — Z7982 Long term (current) use of aspirin: Secondary | ICD-10-CM | POA: Diagnosis not present

## 2015-04-04 DIAGNOSIS — J449 Chronic obstructive pulmonary disease, unspecified: Secondary | ICD-10-CM | POA: Diagnosis not present

## 2015-04-04 LAB — CBC
HEMATOCRIT: 37 % (ref 36.0–46.0)
Hemoglobin: 12.3 g/dL (ref 12.0–15.0)
MCH: 29.8 pg (ref 26.0–34.0)
MCHC: 33.2 g/dL (ref 30.0–36.0)
MCV: 89.6 fL (ref 78.0–100.0)
Platelets: 230 10*3/uL (ref 150–400)
RBC: 4.13 MIL/uL (ref 3.87–5.11)
RDW: 12.7 % (ref 11.5–15.5)
WBC: 6 10*3/uL (ref 4.0–10.5)

## 2015-04-04 LAB — BASIC METABOLIC PANEL
Anion gap: 6 (ref 5–15)
BUN: 22 mg/dL — ABNORMAL HIGH (ref 6–20)
CO2: 28 mmol/L (ref 22–32)
Calcium: 8.8 mg/dL — ABNORMAL LOW (ref 8.9–10.3)
Chloride: 107 mmol/L (ref 101–111)
Creatinine, Ser: 1.08 mg/dL — ABNORMAL HIGH (ref 0.44–1.00)
GFR calc Af Amer: >60 mL/min (ref >60)
GFR calc non Af Amer: 52 mL/min — ABNORMAL LOW (ref >60)
GLUCOSE: 98 mg/dL (ref 65–99)
Potassium: 4.4 mmol/L (ref 3.5–5.1)
Sodium: 141 mmol/L (ref 135–145)

## 2015-04-04 LAB — STREP A DNA PROBE: Strep Gp A Direct, DNA Probe: NEGATIVE

## 2015-04-04 NOTE — Pre-Procedure Instructions (Signed)
Patient given information to sign up for my chart at home. 

## 2015-04-06 ENCOUNTER — Ambulatory Visit (HOSPITAL_COMMUNITY)
Admission: RE | Admit: 2015-04-06 | Discharge: 2015-04-06 | Disposition: A | Discharge status: Home / Self Care | Payer: Medicare Other | Source: Ambulatory Visit | Attending: Orthopedic Surgery | Admitting: Orthopedic Surgery

## 2015-04-06 ENCOUNTER — Encounter (HOSPITAL_COMMUNITY)
Admission: RE | Disposition: A | Discharge status: Home / Self Care | Payer: Self-pay | Source: Ambulatory Visit | Attending: Orthopedic Surgery

## 2015-04-06 ENCOUNTER — Ambulatory Visit (HOSPITAL_COMMUNITY): Payer: Medicare Other | Admitting: Anesthesiology

## 2015-04-06 DIAGNOSIS — Z7982 Long term (current) use of aspirin: Secondary | ICD-10-CM | POA: Insufficient documentation

## 2015-04-06 DIAGNOSIS — E780 Pure hypercholesterolemia: Secondary | ICD-10-CM | POA: Insufficient documentation

## 2015-04-06 DIAGNOSIS — M1990 Unspecified osteoarthritis, unspecified site: Secondary | ICD-10-CM | POA: Diagnosis not present

## 2015-04-06 DIAGNOSIS — K219 Gastro-esophageal reflux disease without esophagitis: Secondary | ICD-10-CM | POA: Insufficient documentation

## 2015-04-06 DIAGNOSIS — M8580 Other specified disorders of bone density and structure, unspecified site: Secondary | ICD-10-CM | POA: Insufficient documentation

## 2015-04-06 DIAGNOSIS — G4700 Insomnia, unspecified: Secondary | ICD-10-CM | POA: Insufficient documentation

## 2015-04-06 DIAGNOSIS — J449 Chronic obstructive pulmonary disease, unspecified: Secondary | ICD-10-CM | POA: Insufficient documentation

## 2015-04-06 DIAGNOSIS — G2581 Restless legs syndrome: Secondary | ICD-10-CM | POA: Insufficient documentation

## 2015-04-06 DIAGNOSIS — I10 Essential (primary) hypertension: Secondary | ICD-10-CM | POA: Diagnosis not present

## 2015-04-06 DIAGNOSIS — F419 Anxiety disorder, unspecified: Secondary | ICD-10-CM | POA: Insufficient documentation

## 2015-04-06 DIAGNOSIS — G5601 Carpal tunnel syndrome, right upper limb: Secondary | ICD-10-CM | POA: Diagnosis not present

## 2015-04-06 DIAGNOSIS — Z79899 Other long term (current) drug therapy: Secondary | ICD-10-CM | POA: Insufficient documentation

## 2015-04-06 HISTORY — PX: CARPAL TUNNEL RELEASE: SHX101

## 2015-04-06 SURGERY — CARPAL TUNNEL RELEASE
Anesthesia: Regional | Site: Wrist | Laterality: Right

## 2015-04-06 MED ORDER — MIDAZOLAM HCL 2 MG/2ML IJ SOLN
INTRAMUSCULAR | Status: AC
  Filled 2015-04-06: qty 2

## 2015-04-06 MED ORDER — LIDOCAINE HCL (PF) 0.5 % IJ SOLN
INTRAMUSCULAR | Status: DC | PRN
  Administered 2015-04-06: 250 mg via INTRAVENOUS

## 2015-04-06 MED ORDER — GLYCOPYRROLATE 0.2 MG/ML IJ SOLN
0.2000 mg | Freq: Once | INTRAMUSCULAR | Status: AC
  Administered 2015-04-06: 0.2 mg via INTRAVENOUS

## 2015-04-06 MED ORDER — FENTANYL CITRATE (PF) 100 MCG/2ML IJ SOLN
INTRAMUSCULAR | Status: AC
  Filled 2015-04-06: qty 2

## 2015-04-06 MED ORDER — FENTANYL CITRATE (PF) 100 MCG/2ML IJ SOLN
INTRAMUSCULAR | Status: AC
Start: 2015-04-06 — End: 2015-04-06
  Filled 2015-04-06: qty 2

## 2015-04-06 MED ORDER — GLYCOPYRROLATE 0.2 MG/ML IJ SOLN
INTRAMUSCULAR | Status: AC
  Filled 2015-04-06: qty 1

## 2015-04-06 MED ORDER — HYDROCODONE-ACETAMINOPHEN 5-325 MG PO TABS: 1.0000 | ORAL_TABLET | Freq: Four times a day (QID) | ORAL | Status: DC | PRN

## 2015-04-06 MED ORDER — FENTANYL CITRATE (PF) 100 MCG/2ML IJ SOLN
INTRAMUSCULAR | Status: DC | PRN
  Administered 2015-04-06 (×4): 25 ug via INTRAVENOUS

## 2015-04-06 MED ORDER — CEFAZOLIN SODIUM-DEXTROSE 2-3 GM-% IV SOLR
2.0000 g | INTRAVENOUS | Status: AC
  Administered 2015-04-06: 2 g via INTRAVENOUS

## 2015-04-06 MED ORDER — FENTANYL CITRATE (PF) 100 MCG/2ML IJ SOLN
25.0000 ug | INTRAMUSCULAR | Status: AC
  Administered 2015-04-06 (×2): 25 ug via INTRAVENOUS

## 2015-04-06 MED ORDER — ONDANSETRON HCL 4 MG/2ML IJ SOLN: 4.0000 mg | Freq: Once | INTRAMUSCULAR | Status: DC | PRN

## 2015-04-06 MED ORDER — MIDAZOLAM HCL 2 MG/2ML IJ SOLN
1.0000 mg | INTRAMUSCULAR | Status: DC | PRN
Start: 2015-04-06 — End: 2015-04-06
  Administered 2015-04-06 (×2): 2 mg via INTRAVENOUS

## 2015-04-06 MED ORDER — MIDAZOLAM HCL 5 MG/5ML IJ SOLN
INTRAMUSCULAR | Status: DC | PRN
  Administered 2015-04-06: 2 mg via INTRAVENOUS

## 2015-04-06 MED ORDER — LACTATED RINGERS IV SOLN
INTRAVENOUS | Status: DC
  Administered 2015-04-06: 10:00:00 via INTRAVENOUS

## 2015-04-06 MED ORDER — BUPIVACAINE HCL (PF) 0.5 % IJ SOLN
INTRAMUSCULAR | Status: DC | PRN
  Administered 2015-04-06: 10 mL

## 2015-04-06 MED ORDER — 0.9 % SODIUM CHLORIDE (POUR BTL) OPTIME
TOPICAL | Status: DC | PRN
  Administered 2015-04-06: 500 mL

## 2015-04-06 MED ORDER — CHLORHEXIDINE GLUCONATE 4 % EX LIQD: 60.0000 mL | Freq: Once | CUTANEOUS | Status: DC

## 2015-04-06 MED ORDER — BUPIVACAINE HCL (PF) 0.5 % IJ SOLN
INTRAMUSCULAR | Status: AC
  Filled 2015-04-06: qty 30

## 2015-04-06 MED ORDER — ONDANSETRON HCL 4 MG/2ML IJ SOLN
INTRAMUSCULAR | Status: AC
  Filled 2015-04-06: qty 2

## 2015-04-06 MED ORDER — FENTANYL CITRATE (PF) 100 MCG/2ML IJ SOLN: 25.0000 ug | INTRAMUSCULAR | Status: DC | PRN

## 2015-04-06 MED ORDER — ONDANSETRON HCL 4 MG/2ML IJ SOLN
4.0000 mg | Freq: Once | INTRAMUSCULAR | Status: AC
  Administered 2015-04-06: 4 mg via INTRAVENOUS

## 2015-04-06 MED ORDER — CEFAZOLIN SODIUM-DEXTROSE 2-3 GM-% IV SOLR
INTRAVENOUS | Status: AC
  Filled 2015-04-06: qty 50

## 2015-04-06 MED ORDER — PROPOFOL INFUSION 10 MG/ML OPTIME
INTRAVENOUS | Status: DC | PRN
  Administered 2015-04-06: 50 ug/kg/min via INTRAVENOUS

## 2015-04-06 MED ORDER — SODIUM CHLORIDE 0.9 % IJ SOLN
INTRAMUSCULAR | Status: AC
  Filled 2015-04-06: qty 3

## 2015-04-06 MED ORDER — PROPOFOL 10 MG/ML IV BOLUS
INTRAVENOUS | Status: AC
  Filled 2015-04-06: qty 20

## 2015-04-06 SURGICAL SUPPLY — 41 items
BAG HAMPER (MISCELLANEOUS) ×3 IMPLANT
BANDAGE ELASTIC 3 VELCRO NS (GAUZE/BANDAGES/DRESSINGS) ×3 IMPLANT
BANDAGE ESMARK 4X12 BL STRL LF (DISPOSABLE) ×1 IMPLANT
BANDAGE GAUZE ELAST BULKY 4 IN (GAUZE/BANDAGES/DRESSINGS) ×2 IMPLANT
BLADE SURG 15 STRL LF DISP TIS (BLADE) ×1 IMPLANT
BLADE SURG 15 STRL SS (BLADE) ×3
BNDG CMPR 12X4 ELC STRL LF (DISPOSABLE) ×1
BNDG COHESIVE 4X5 TAN STRL (GAUZE/BANDAGES/DRESSINGS) ×3 IMPLANT
BNDG ESMARK 4X12 BLUE STRL LF (DISPOSABLE) ×3
BNDG GAUZE ELAST 4 BULKY (GAUZE/BANDAGES/DRESSINGS) ×2 IMPLANT
CHLORAPREP W/TINT 26ML (MISCELLANEOUS) ×3 IMPLANT
CLOTH BEACON ORANGE TIMEOUT ST (SAFETY) ×3 IMPLANT
COVER LIGHT HANDLE STERIS (MISCELLANEOUS) ×6 IMPLANT
CUFF TOURNIQUET SINGLE 18IN (TOURNIQUET CUFF) ×3 IMPLANT
DECANTER SPIKE VIAL GLASS SM (MISCELLANEOUS) ×3 IMPLANT
DRSG XEROFORM 1X8 (GAUZE/BANDAGES/DRESSINGS) ×2 IMPLANT
ELECT NDL TIP 2.8 STRL (NEEDLE) ×1 IMPLANT
ELECT NEEDLE TIP 2.8 STRL (NEEDLE) ×3 IMPLANT
ELECT REM PT RETURN 9FT ADLT (ELECTROSURGICAL) ×3
ELECTRODE REM PT RTRN 9FT ADLT (ELECTROSURGICAL) ×1 IMPLANT
GAUZE SPONGE 4X4 12PLY STRL (GAUZE/BANDAGES/DRESSINGS) IMPLANT
GLOVE INDICATOR 7.5 STRL GRN (GLOVE) ×2 IMPLANT
GLOVE SKINSENSE NS SZ8.0 LF (GLOVE) ×2
GLOVE SKINSENSE STRL SZ8.0 LF (GLOVE) ×1 IMPLANT
GLOVE SS N UNI LF 8.5 STRL (GLOVE) ×3 IMPLANT
GLOVE SURG SS PI 7.0 STRL IVOR (GLOVE) ×2 IMPLANT
GOWN STRL REUS W/ TWL LRG LVL3 (GOWN DISPOSABLE) ×1 IMPLANT
GOWN STRL REUS W/TWL LRG LVL3 (GOWN DISPOSABLE) ×3
GOWN STRL REUS W/TWL XL LVL3 (GOWN DISPOSABLE) ×3 IMPLANT
HAND ALUMI XLG (SOFTGOODS) ×3 IMPLANT
KIT ROOM TURNOVER APOR (KITS) ×3 IMPLANT
MANIFOLD NEPTUNE II (INSTRUMENTS) ×3 IMPLANT
NDL HYPO 21X1.5 SAFETY (NEEDLE) ×1 IMPLANT
NEEDLE HYPO 21X1.5 SAFETY (NEEDLE) ×3 IMPLANT
NS IRRIG 1000ML POUR BTL (IV SOLUTION) ×3 IMPLANT
PACK BASIC LIMB (CUSTOM PROCEDURE TRAY) ×3 IMPLANT
PAD ARMBOARD 7.5X6 YLW CONV (MISCELLANEOUS) ×3 IMPLANT
SET BASIN LINEN APH (SET/KITS/TRAYS/PACK) ×3 IMPLANT
SPONGE GAUZE 4X4 12PLY (GAUZE/BANDAGES/DRESSINGS) ×2 IMPLANT
SUT ETHILON 3 0 FSL (SUTURE) ×3 IMPLANT
SYR CONTROL 10ML LL (SYRINGE) ×3 IMPLANT

## 2015-04-06 NOTE — Brief Op Note (Signed)
04/06/2015  11:12 AM  PATIENT:  Allison Freeman  69 y.o. female  PRE-OPERATIVE DIAGNOSIS:  CARPAL TUNNEL SYNDROME RIGHT WRIST  POST-OPERATIVE DIAGNOSIS:  CARPAL TUNNEL SYNDROME RIGHT WRIST  Operative findings compression right median nerve with apple core shape but normal color  Procedure this patient was identified in the preop area the chart review was completed right wrist marked as surgical site patient taken to surgery Bier block established 2 g of Ancef given for preoperative and a biotics. After sterile prep and drape timeout was completed.  I made the incision over the palm in line with the ring finger on the radial border. I divided the subcutaneous tissue found the palmar fascia and the end of the carpal tunnel and through blunt dissection dissected beneath the transverse carpal ligament and then released it. I examined the carpal tunnel contents and found no space-occupying lesions  I irrigated the wound and closed with interrupted 3-0 nylon suture.  I injected 10 mL of Marcaine without epinephrine  Patient taken recovery room in stable condition  PROCEDURE:  Procedure(s): CARPAL TUNNEL RELEASE (Right)  SURGEON:  Surgeon(s) and Role:    * Carole Civil, MD - Primary  PHYSICIAN ASSISTANT:   ASSISTANTS: none   ANESTHESIA:   regional  EBL:  Total I/O In: 400 [I.V.:400] Out: -   BLOOD ADMINISTERED:none  DRAINS: none   LOCAL MEDICATIONS USED:  MARCAINE     SPECIMEN:  No Specimen  DISPOSITION OF SPECIMEN:  N/A  COUNTS:  YES  TOURNIQUET:   Total Tourniquet Time Documented: Upper Arm (Right) - 27 minutes Total: Upper Arm (Right) - 27 minutes   DICTATION: .Viviann Spare Dictation  PLAN OF CARE: Discharge to home after PACU  PATIENT DISPOSITION:  PACU - hemodynamically stable.   Delay start of Pharmacological VTE agent (>24hrs) due to surgical blood loss or risk of bleeding: not applicable

## 2015-04-06 NOTE — Transfer of Care (Signed)
Immediate Anesthesia Transfer of Care Note  [facility]: [facility]  Procedure(s) Performed: Procedure(s): CARPAL TUNNEL RELEASE (Right)  Patient Location: PACU  Anesthesia Type:Bier block  Level of Consciousness: awake and patient cooperative  Airway & Oxygen Therapy: Patient Spontanous Breathing and Patient connected to face mask oxygen  Post-op Assessment: Report given to RN, Post -op Vital signs reviewed and stable and Patient moving all extremities  Post vital signs: Reviewed and stable  Last Vitals:  Filed Vitals:   04/06/15 1030  BP: 119/76  Pulse:   Temp:   Resp: 19    Complications: No apparent anesthesia complications

## 2015-04-06 NOTE — Interval H&P Note (Signed)
History and Physical Interval Note:  04/06/2015 8:45 AM  Allison Freeman  has presented today for surgery, with the diagnosis of CARPAL TUNNEL SYNDROME RIGHT WRIST  The various methods of treatment have been discussed with the patient and family. After consideration of risks, benefits and other options for treatment, the patient has consented to  Procedure(s): CARPAL TUNNEL RELEASE (Right) as a surgical intervention .  The patient's history has been reviewed, patient examined, no change in status, stable for surgery.  I have reviewed the patient's chart and labs.  Questions were answered to the patient's satisfaction.     Arther Abbott

## 2015-04-06 NOTE — Op Note (Signed)
04/06/2015  11:12 AM  PATIENT:  Allison Freeman  69 y.o. female  PRE-OPERATIVE DIAGNOSIS:  CARPAL TUNNEL SYNDROME RIGHT WRIST  POST-OPERATIVE DIAGNOSIS:  CARPAL TUNNEL SYNDROME RIGHT WRIST  Operative findings compression right median nerve with apple core shape but normal color  Procedure this patient was identified in the preop area the chart review was completed right wrist marked as surgical site patient taken to surgery Bier block established 2 g of Ancef given for preoperative and a biotics. After sterile prep and drape timeout was completed.  I made the incision over the palm in line with the ring finger on the radial border. I divided the subcutaneous tissue found the palmar fascia and the end of the carpal tunnel and through blunt dissection dissected beneath the transverse carpal ligament and then released it. I examined the carpal tunnel contents and found no space-occupying lesions  I irrigated the wound and closed with interrupted 3-0 nylon suture.  I injected 10 mL of Marcaine without epinephrine  Patient taken recovery room in stable condition  PROCEDURE:  Procedure(s): CARPAL TUNNEL RELEASE (Right)  SURGEON:  Surgeon(s) and Role:    * Carole Civil, MD - Primary  PHYSICIAN ASSISTANT:   ASSISTANTS: none   ANESTHESIA:   regional  EBL:  Total I/O In: 400 [I.V.:400] Out: -   BLOOD ADMINISTERED:none  DRAINS: none   LOCAL MEDICATIONS USED:  MARCAINE     SPECIMEN:  No Specimen  DISPOSITION OF SPECIMEN:  N/A  COUNTS:  YES  TOURNIQUET:   Total Tourniquet Time Documented: Upper Arm (Right) - 27 minutes Total: Upper Arm (Right) - 27 minutes   DICTATION: .Viviann Spare Dictation  PLAN OF CARE: Discharge to home after PACU  PATIENT DISPOSITION:  PACU - hemodynamically stable.   Delay start of Pharmacological VTE agent (>24hrs) due to surgical blood loss or risk of bleeding: not applicable

## 2015-04-06 NOTE — Anesthesia Preprocedure Evaluation (Signed)
Anesthesia Evaluation  Patient identified by MRN, date of birth, ID band Patient awake    Reviewed: Allergy & Precautions, NPO status , Patient's Chart, lab work & pertinent test results  Airway Mallampati: II  TM Distance: >3 FB     Dental  (+) Teeth Intact   Pulmonary neg pulmonary ROS,  breath sounds clear to auscultation        Cardiovascular hypertension, Pt. on medications Rhythm:Regular Rate:Normal     Neuro/Psych PSYCHIATRIC DISORDERS Anxiety Depression    GI/Hepatic GERD-  Controlled,  Endo/Other    Renal/GU      Musculoskeletal  (+) Arthritis -,   Abdominal   Peds  Hematology   Anesthesia Other Findings   Reproductive/Obstetrics                             Anesthesia Physical Anesthesia Plan  ASA: III  Anesthesia Plan: Bier Block   Post-op Pain Management:    Induction: Intravenous  Airway Management Planned: Nasal Cannula  Additional Equipment:   Intra-op Plan:   Post-operative Plan:   Informed Consent: I have reviewed the patients History and Physical, chart, labs and discussed the procedure including the risks, benefits and alternatives for the proposed anesthesia with the patient or authorized representative who has indicated his/her understanding and acceptance.     Plan Discussed with:   Anesthesia Plan Comments:         Anesthesia Quick Evaluation

## 2015-04-06 NOTE — Anesthesia Postprocedure Evaluation (Signed)
  Anesthesia Post-op Note  Patient: Allison Freeman  Procedure(s) Performed: Procedure(s): CARPAL TUNNEL RELEASE (Right)  Patient Location: PACU  Anesthesia Type:Bier block  Level of Consciousness: awake, alert , oriented and patient cooperative  Airway and Oxygen Therapy: Patient Spontanous Breathing  Post-op Pain: 2 /10, mild  Post-op Assessment: Post-op Vital signs reviewed, Patient's Cardiovascular Status Stable, Respiratory Function Stable, Patent Airway, No signs of Nausea or vomiting, Pain level controlled and No headache              Post-op Vital Signs: Reviewed and stable  Last Vitals:  Filed Vitals:   04/06/15 1122  BP:   Pulse: 95  Temp:   Resp: 21    Complications: No apparent anesthesia complications

## 2015-04-06 NOTE — Interval H&P Note (Signed)
History and Physical Interval Note:  04/06/2015 10:27 AM  Allison Freeman  has presented today for surgery, with the diagnosis of CARPAL TUNNEL SYNDROME RIGHT WRIST  The various methods of treatment have been discussed with the patient and family. After consideration of risks, benefits and other options for treatment, the patient has consented to  Procedure(s): CARPAL TUNNEL RELEASE (Right) as a surgical intervention .  The patient's history has been reviewed, patient examined, no change in status, stable for surgery.  I have reviewed the patient's chart and labs.  Questions were answered to the patient's satisfaction.     Arther Abbott

## 2015-04-06 NOTE — Anesthesia Procedure Notes (Signed)
Anesthesia Regional Block:  Bier block (IV Regional)  Pre-Anesthetic Checklist: ,,, Correct Patient, Correct Site, Correct Laterality, Correct Procedure, Correct Position, site marked, surgical consent, pre-op evaluation,  Laterality: Right  Prep: Betadine       Needles:  Injection technique: Single-shot      Needle Gauge: 22 and 22 G    Additional Needles: Bier block (IV Regional) Narrative:

## 2015-04-09 ENCOUNTER — Encounter (HOSPITAL_COMMUNITY): Payer: Self-pay | Admitting: Orthopedic Surgery

## 2015-04-09 ENCOUNTER — Ambulatory Visit (INDEPENDENT_AMBULATORY_CARE_PROVIDER_SITE_OTHER): Payer: Self-pay | Admitting: Orthopedic Surgery

## 2015-04-09 VITALS — BP 142/77 | Ht 65.0 in | Wt 155.0 lb

## 2015-04-09 DIAGNOSIS — Z9889 Other specified postprocedural states: Secondary | ICD-10-CM

## 2015-04-09 NOTE — Progress Notes (Signed)
Patient ID: Allison Freeman, female   DOB: 1945-11-08, 69 y.o.   MRN: [id number]  Follow up visit  Chief Complaint  Patient presents with  . Follow-up    Post op #1, Right CTR, DOS 04-06-15.    BP 142/77 mmHg  Ht 5\' 5"  (1.651 m)  Wt 155 lb (70.308 kg)  BMI 25.79 kg/m2  No diagnosis found.  Carpal tunnel release on the right. The incision looks fine after dressing change. A 4 x 4 dressing was applied with a Ace bandage 2 inch. She has some stiffness in the fingers. Some of her symptoms resolved.  Return for suture removal  Dr. Luna Glasgow will cover in my absence if any issues arise.

## 2015-04-13 ENCOUNTER — Ambulatory Visit (HOSPITAL_COMMUNITY)
Admission: RE | Admit: 2015-04-13 | Discharge: 2015-04-13 | Disposition: A | Discharge status: Home / Self Care | Payer: Medicare Other | Source: Ambulatory Visit | Attending: Family Medicine | Admitting: Family Medicine

## 2015-04-13 DIAGNOSIS — Z1231 Encounter for screening mammogram for malignant neoplasm of breast: Secondary | ICD-10-CM | POA: Insufficient documentation

## 2015-04-17 ENCOUNTER — Ambulatory Visit (INDEPENDENT_AMBULATORY_CARE_PROVIDER_SITE_OTHER): Payer: Self-pay | Admitting: Orthopedic Surgery

## 2015-04-17 DIAGNOSIS — Z9889 Other specified postprocedural states: Secondary | ICD-10-CM

## 2015-04-17 NOTE — Progress Notes (Signed)
Patient in today for suture removal right wrist s/p carpal tunnel release 04/06/15- post op day 11.  Wound cleaned with alcohol and sutures removed, patient tolerated well, steri strips applied.   Patient has follow up in one month.

## 2015-05-14 ENCOUNTER — Ambulatory Visit: Payer: Medicare Other | Admitting: Orthopedic Surgery

## 2015-05-15 ENCOUNTER — Ambulatory Visit (INDEPENDENT_AMBULATORY_CARE_PROVIDER_SITE_OTHER): Payer: Self-pay | Admitting: Orthopedic Surgery

## 2015-05-15 VITALS — BP 145/85 | Ht 65.0 in | Wt 155.0 lb

## 2015-05-15 DIAGNOSIS — Z9889 Other specified postprocedural states: Secondary | ICD-10-CM

## 2015-05-15 DIAGNOSIS — G5601 Carpal tunnel syndrome, right upper limb: Secondary | ICD-10-CM

## 2015-05-15 NOTE — Progress Notes (Signed)
Patient ID: Allison Freeman, female   DOB: 05-25-1946, 69 y.o.   MRN: [id number]  Follow up visit  Chief Complaint  Patient presents with  . Follow-up    5 week follow up right carpal tunnel release, DOS 04/06/15    BP 145/85 mmHg  Ht 5\' 5"  (1.651 m)  Wt 155 lb (70.308 kg)  BMI 25.79 kg/m2  No diagnosis found.  The patient had a carpal tunnel release of the right. She has no complaints. Her symptoms have resolved she has no scar tenderness or sensitivity and she has full range of motion  She will be released follow-up as needed

## 2015-05-30 ENCOUNTER — Other Ambulatory Visit: Payer: Self-pay | Admitting: Family Medicine

## 2015-06-03 IMAGING — MG MM DIGITAL DIAGNOSTIC UNILAT R
2 series · 2 of 2 positions shown · non-contrast
Comparison: Priors

CLINICAL DATA: Screening callback for questioned right breast
distortion

EXAM:
DIGITAL DIAGNOSTIC  right MAMMOGRAM

[R CC]
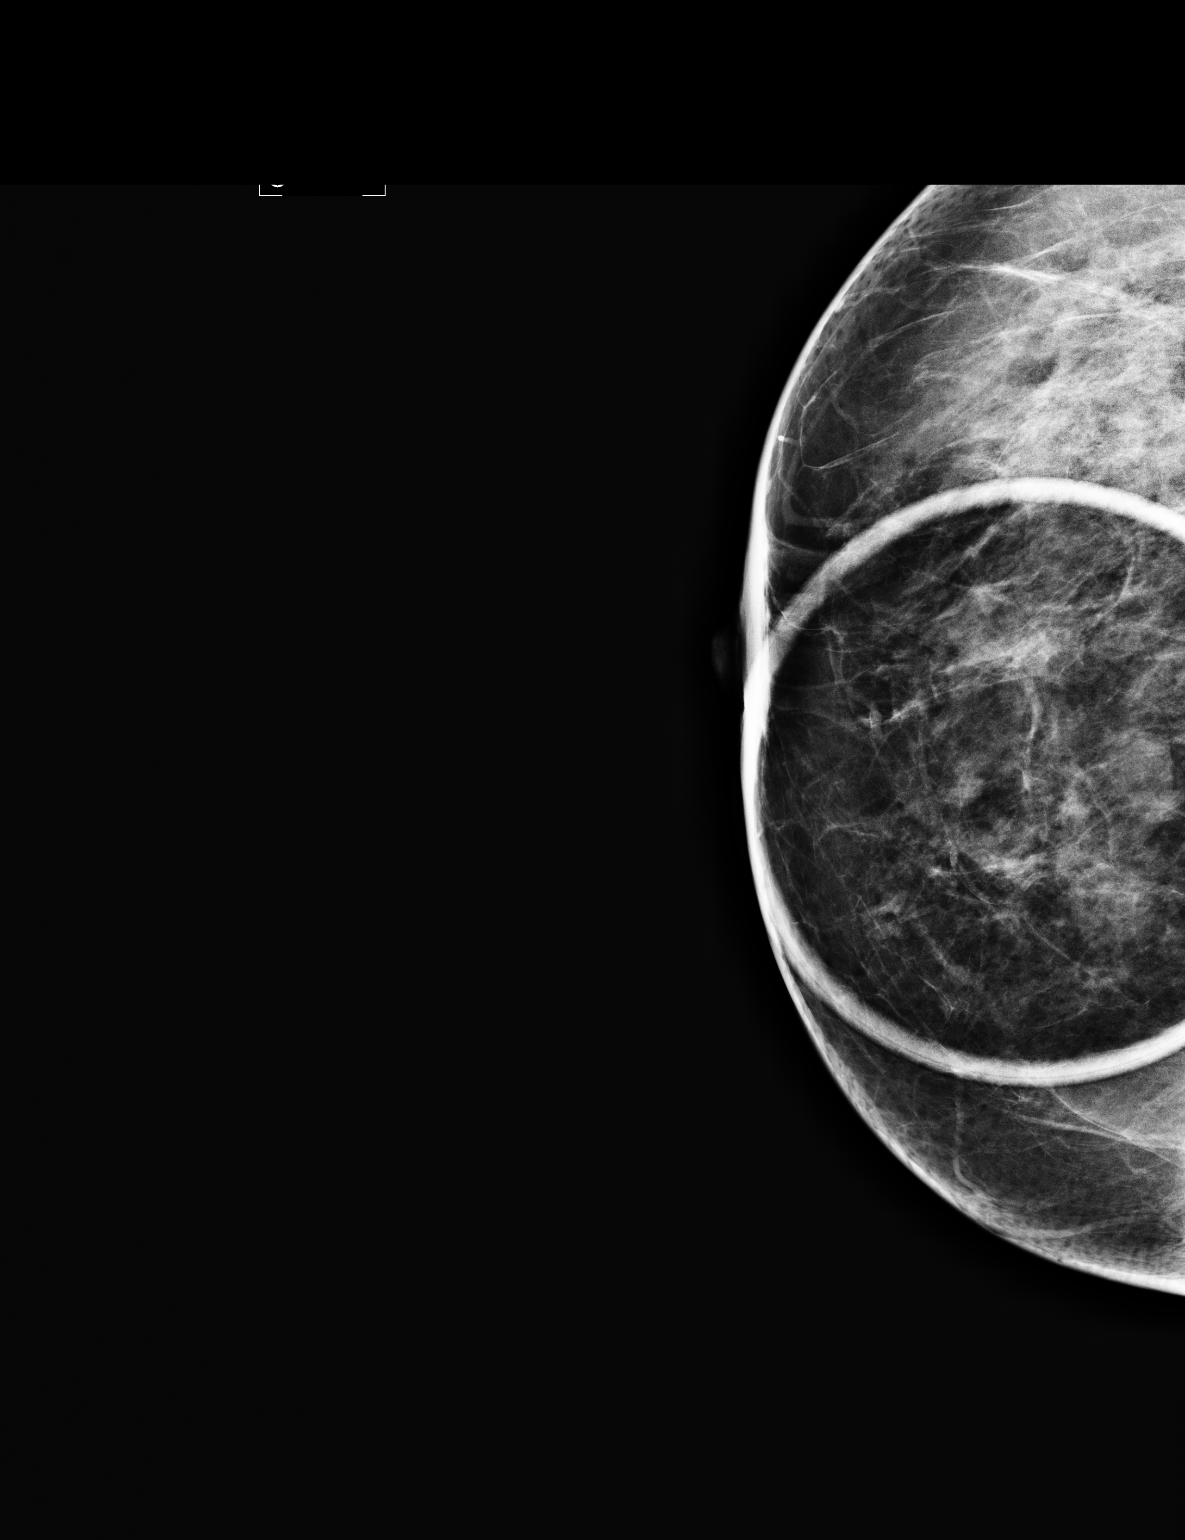

[R MLO]
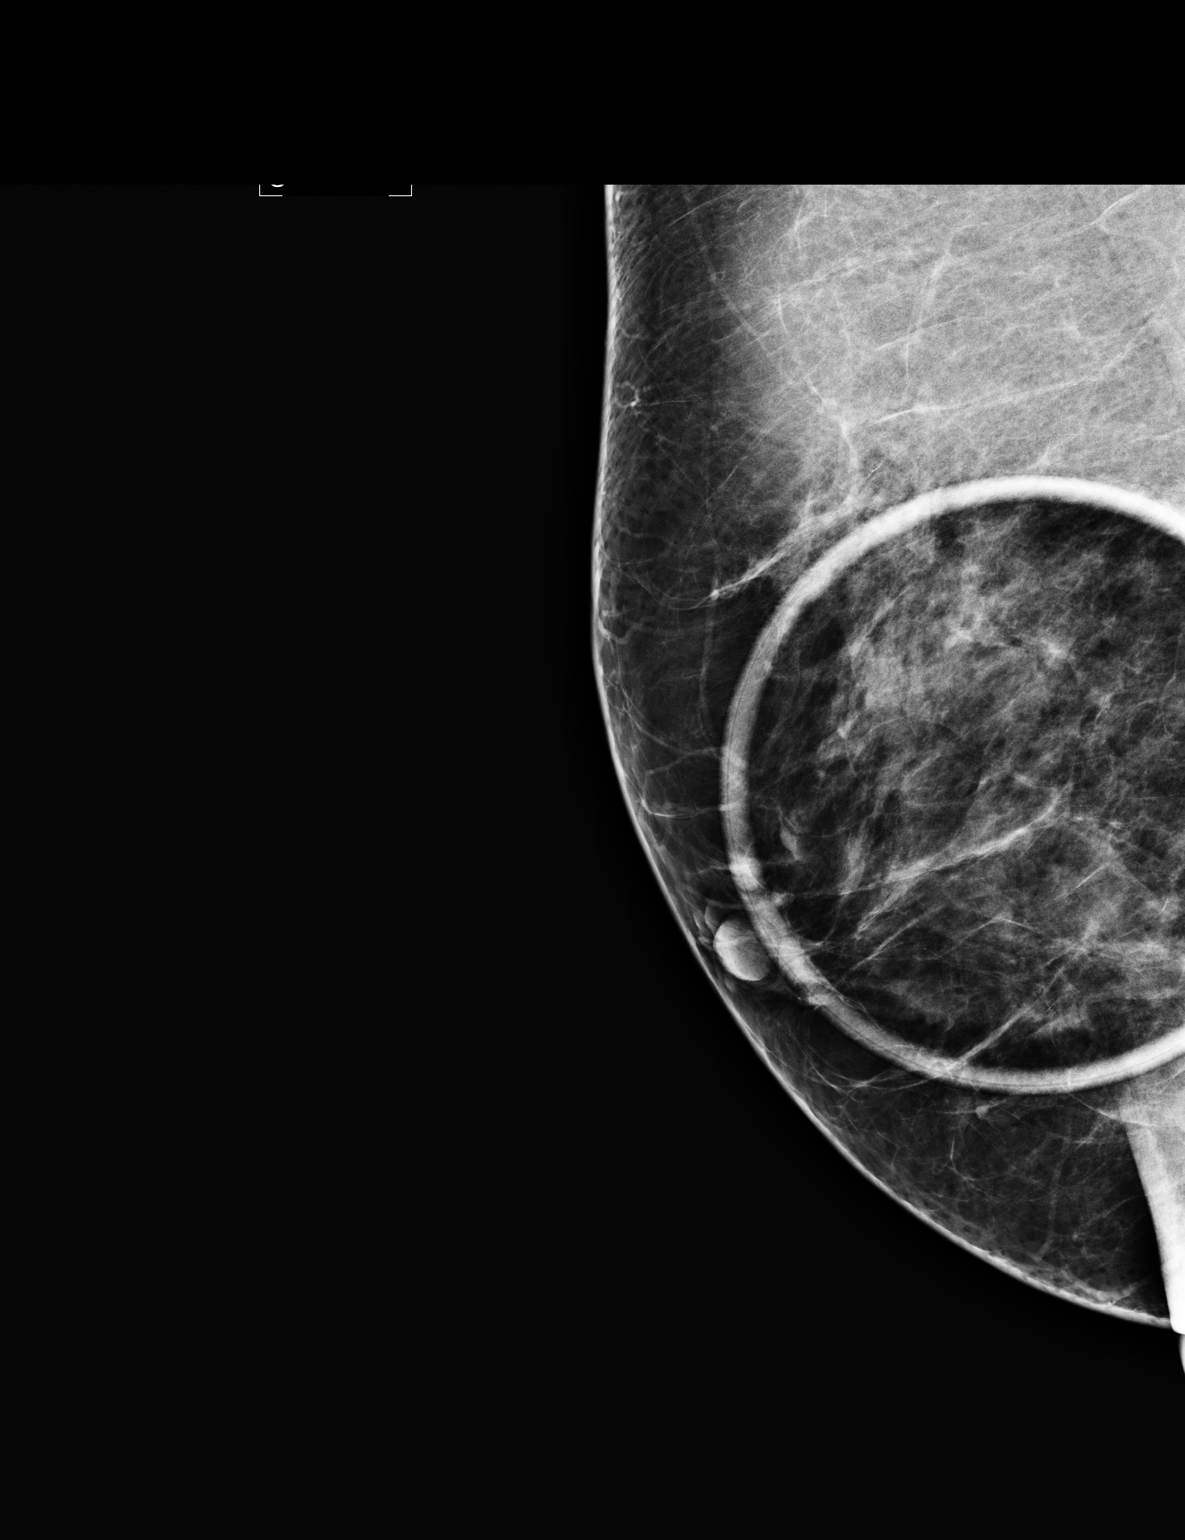

[2 of 2 positions shown; findings below may reference images not displayed]

ACR Breast Density Category c: The breast tissue is heterogeneously
dense, which may obscure small masses.
FINDINGS: No persistent distortion or other abnormality is identified with the
central right breast.
IMPRESSION: No evidence for malignancy in the right breast.

RECOMMENDATION:
Screening mammogram in one year.(Code:[id number])

I have discussed the findings and recommendations with the patient.
Results were also provided in writing at the conclusion of the
visit. If applicable, a reminder letter will be sent to the patient
regarding the next appointment.

BI-RADS CATEGORY  1: Negative.

## 2015-06-16 ENCOUNTER — Emergency Department (HOSPITAL_COMMUNITY): Payer: Medicare Other

## 2015-06-16 ENCOUNTER — Emergency Department (HOSPITAL_COMMUNITY)
Admission: EM | Admit: 2015-06-16 | Discharge: 2015-06-16 | Disposition: A | Discharge status: Home / Self Care | Payer: Medicare Other | Attending: Emergency Medicine | Admitting: Emergency Medicine

## 2015-06-16 ENCOUNTER — Encounter (HOSPITAL_COMMUNITY): Payer: Self-pay | Admitting: *Deleted

## 2015-06-16 DIAGNOSIS — J449 Chronic obstructive pulmonary disease, unspecified: Secondary | ICD-10-CM | POA: Diagnosis not present

## 2015-06-16 DIAGNOSIS — Z79899 Other long term (current) drug therapy: Secondary | ICD-10-CM | POA: Diagnosis not present

## 2015-06-16 DIAGNOSIS — Z7982 Long term (current) use of aspirin: Secondary | ICD-10-CM | POA: Diagnosis not present

## 2015-06-16 DIAGNOSIS — F329 Major depressive disorder, single episode, unspecified: Secondary | ICD-10-CM | POA: Insufficient documentation

## 2015-06-16 DIAGNOSIS — G2581 Restless legs syndrome: Secondary | ICD-10-CM | POA: Diagnosis not present

## 2015-06-16 DIAGNOSIS — I10 Essential (primary) hypertension: Secondary | ICD-10-CM | POA: Diagnosis not present

## 2015-06-16 DIAGNOSIS — R079 Chest pain, unspecified: Secondary | ICD-10-CM

## 2015-06-16 DIAGNOSIS — M1990 Unspecified osteoarthritis, unspecified site: Secondary | ICD-10-CM | POA: Diagnosis not present

## 2015-06-16 DIAGNOSIS — E780 Pure hypercholesterolemia: Secondary | ICD-10-CM | POA: Diagnosis not present

## 2015-06-16 DIAGNOSIS — Z7951 Long term (current) use of inhaled steroids: Secondary | ICD-10-CM | POA: Diagnosis not present

## 2015-06-16 DIAGNOSIS — R61 Generalized hyperhidrosis: Secondary | ICD-10-CM | POA: Diagnosis not present

## 2015-06-16 DIAGNOSIS — F410 Panic disorder [episodic paroxysmal anxiety] without agoraphobia: Secondary | ICD-10-CM | POA: Diagnosis not present

## 2015-06-16 DIAGNOSIS — K219 Gastro-esophageal reflux disease without esophagitis: Secondary | ICD-10-CM | POA: Diagnosis not present

## 2015-06-16 DIAGNOSIS — R072 Precordial pain: Secondary | ICD-10-CM | POA: Diagnosis not present

## 2015-06-16 LAB — CBC WITH DIFFERENTIAL/PLATELET
BASOS PCT: 1 %
Basophils Absolute: 0 10*3/uL (ref 0.0–0.1)
EOS ABS: 0.2 10*3/uL (ref 0.0–0.7)
EOS PCT: 3 %
HCT: 37 % (ref 36.0–46.0)
HEMOGLOBIN: 12.4 g/dL (ref 12.0–15.0)
Lymphocytes Relative: 47 %
Lymphs Abs: 3 10*3/uL (ref 0.7–4.0)
MCH: 29.5 pg (ref 26.0–34.0)
MCHC: 33.5 g/dL (ref 30.0–36.0)
MCV: 87.9 fL (ref 78.0–100.0)
Monocytes Absolute: 0.5 10*3/uL (ref 0.1–1.0)
Monocytes Relative: 8 %
NEUTROS PCT: 41 %
Neutro Abs: 2.6 10*3/uL (ref 1.7–7.7)
PLATELETS: 254 10*3/uL (ref 150–400)
RBC: 4.21 MIL/uL (ref 3.87–5.11)
RDW: 12.7 % (ref 11.5–15.5)
WBC: 6.3 10*3/uL (ref 4.0–10.5)

## 2015-06-16 LAB — BASIC METABOLIC PANEL
Anion gap: 7 (ref 5–15)
BUN: 20 mg/dL (ref 6–20)
CHLORIDE: 104 mmol/L (ref 101–111)
CO2: 26 mmol/L (ref 22–32)
CREATININE: 1.03 mg/dL — AB (ref 0.44–1.00)
Calcium: 8.5 mg/dL — ABNORMAL LOW (ref 8.9–10.3)
GFR, EST NON AFRICAN AMERICAN: 55 mL/min — AB (ref >60)
Glucose, Bld: 116 mg/dL — ABNORMAL HIGH (ref 65–99)
POTASSIUM: 3.3 mmol/L — AB (ref 3.5–5.1)
SODIUM: 137 mmol/L (ref 135–145)

## 2015-06-16 LAB — TROPONIN I

## 2015-06-16 NOTE — ED Provider Notes (Signed)
CSN: [id number]     Arrival date & time 06/16/15  2008 History  This chart was scribed for Nat Christen, MD by Helane Gunther, ED Scribe. This patient was seen in room APA06/APA06 and the patient's care was started at 8:35 PM.    Chief Complaint  Patient presents with  . Chest Pain   The history is provided by the patient. No language interpreter was used.   HPI Comments: ALYSSIA HEESE is a 69 y.o. female with a PMHx of acid reflux, arthritis, HTN, and hypercholesteremia who presents to the Emergency Department complaining of intermittent, aching, mid-sternal chest pain onset at 11 AM after driving home from [place] this morning (4-5 hour drive). She reports associated diaphoresis, but states she has been a little nervous about the pain. She denies any history of heart issues and states she never smoked. She has a FHx of heart issues (mother, aneurysm).Pt denies SOB, nausea, and vomiting.  Past Medical History  Diagnosis Date  . Acid reflux   . Hypertension   . Hypercholesterolemia   . Arthritis   . Depression   . Panic attacks   . COPD (chronic obstructive pulmonary disease)     Mild  . Reflux   . Insomnia   . Osteopenia   . Restless leg syndrome    Past Surgical History  Procedure Laterality Date  . Tonsillectomy    . Rectal surgery    . Bladder surgery    . Abdominal hysterectomy  2001  . Knee surgery Right 2003  . Dilation and curettage of uterus    . Cystocele repair  2010  . Rectocele repair  2010  . Breast biopsy  1987  . Breast cyst excision  1998  . Colonoscopy  09/19/2004    [id number] hemorrhoids, otherwise normal rectum/Sigmoid diverticula.  Remainder of colon mucosa appeared normal  . Colonoscopy with esophagogastroduodenoscopy (egd) N/A 06/24/2013    [id number] polyp-removed. Small hiatal hernia. Abnormal gastric mucosa-query NSAID effect-s/p (chronic inactive gastritis, negative H. pylori)/TCS: Prominent internal hemorrhoids-likely source of  hematochezia. Colonic diverticulosis. Colonic polyp-removed (tubular adenoma)  . Carpal tunnel release Right 04/06/2015    Procedure: CARPAL TUNNEL RELEASE;  Surgeon: Carole Civil, MD;  Location: [facility];  Service: Orthopedics;  Laterality: Right;   Family History  Problem Relation Age of Onset  . Cancer Mother     Breast  . Hypertension Father   . Heart attack Father   . Cancer Sister     Breast  . Cancer Sister     breast  . Colon cancer Neg Hx    Social History  Substance Use Topics  . Smoking status: Never Smoker   . Smokeless tobacco: None  . Alcohol Use: No   OB History    No data available     Review of Systems  Constitutional: Positive for diaphoresis.  Respiratory: Negative for shortness of breath.   Cardiovascular: Positive for chest pain.  Gastrointestinal: Negative for nausea and vomiting.  All other systems reviewed and are negative.   Allergies  Other  Home Medications   Prior to Admission medications   Medication Sig Start Date End Date Taking? Authorizing Alyaan Budzynski  ALPRAZolam (XANAX) 0.5 MG tablet TAKE ONE TABLET BY MOUTH AT BEDTIME AS NEEDED Patient taking differently: Take 0.5 mg by mouth at bedtime as needed for anxiety or sleep. TAKE ONE TABLET BY MOUTH AT BEDTIME AS NEEDED 02/22/15  Yes Mikey Kirschner, MD  aspirin EC 81 MG tablet Take 81 mg  by mouth daily.   Yes Historical Carrel Leather, MD  fluticasone Asencion Islam) 50 MCG/ACT nasal spray Use 2 sprays in each  nostril once daily 05/30/15  Yes Mikey Kirschner, MD  hydroxypropyl methylcellulose / hypromellose (ISOPTO TEARS / GONIOVISC) 2.5 % ophthalmic solution Place 1 drop into both eyes 3 (three) times daily.   Yes Historical Zachary Nole, MD  lisinopril (PRINIVIL,ZESTRIL) 10 MG tablet Take 1 tablet by mouth  daily 05/30/15  Yes Mikey Kirschner, MD  Multiple Vitamin (MULTIVITAMIN) tablet Take 1 tablet by mouth daily.   Yes Historical Fern Asmar, MD  Omega-3 Fatty Acids (FISH OIL) 1200 MG CAPS Take 1 capsule by  mouth daily.   Yes Historical Kenecia Barren, MD  pantoprazole (PROTONIX) 40 MG tablet TAKE 1 TABLET BY MOUTH  TWICE A DAY BEFORE A MEAL 05/30/15  Yes Mikey Kirschner, MD  pravastatin (PRAVACHOL) 40 MG tablet Take 1 tablet by mouth  daily 05/30/15  Yes Mikey Kirschner, MD  rOPINIRole (REQUIP) 1 MG tablet Take 1 tablet by mouth  daily 05/30/15  Yes Mikey Kirschner, MD  traMADol (ULTRAM) 50 MG tablet Take 1 tablet (50 mg total) by mouth every 8 (eight) hours as needed. Patient taking differently: Take 50 mg by mouth 2 (two) times daily.  02/22/15  Yes Mikey Kirschner, MD  HYDROcodone-acetaminophen (NORCO) 5-325 MG per tablet Take 1 tablet by mouth every 6 (six) hours as needed for moderate pain. 04/06/15   Carole Civil, MD   BP 139/67 mmHg  Pulse 87  Resp 16  SpO2 94% Physical Exam  Constitutional: She is oriented to person, place, and time. She appears well-developed and well-nourished.  HENT:  Head: Normocephalic and atraumatic.  Eyes: Conjunctivae and EOM are normal. Pupils are equal, round, and reactive to light.  Neck: Normal range of motion. Neck supple.  Cardiovascular: Normal rate and regular rhythm.   Pulmonary/Chest: Effort normal and breath sounds normal.  Abdominal: Soft. Bowel sounds are normal.  Musculoskeletal: Normal range of motion.  Neurological: She is alert and oriented to person, place, and time.  Skin: Skin is warm and dry.  Psychiatric: She has a normal mood and affect. Her behavior is normal.  Nursing note and vitals reviewed.   ED Course  Procedures  DIAGNOSTIC STUDIES: Oxygen Saturation is 96% on RA, adequate by my interpretation.    COORDINATION OF CARE: 8:42 PM - Discussed normal EKG. Discussed plans to order diagnostic studies and imaging. Pt advised of plan for treatment and pt agrees.  Labs Review Labs Reviewed  BASIC METABOLIC PANEL - Abnormal; Notable for the following:    Potassium 3.3 (*)    Glucose, Bld 116 (*)    Creatinine, Ser 1.03 (*)     Calcium 8.5 (*)    GFR calc non Af Amer 55 (*)    All other components within normal limits  CBC WITH DIFFERENTIAL/PLATELET  TROPONIN I    Imaging Review Dg Chest 2 View  06/16/2015   CLINICAL DATA:  Mid chest pain since 11 a.m. today.  EXAM: CHEST  2 VIEW  COMPARISON:  10/26/2011.  FINDINGS: Normal sized heart. Clear lungs. Normal vascularity. Mild thoracic spine degenerative changes.  IMPRESSION: No acute abnormality.   Electronically Signed   By: Claudie Revering [name]   On: 06/16/2015 21:46   I have personally reviewed and evaluated these images and lab results as part of my medical decision-making.   EKG Interpretation   Date/Time:  Saturday June 16 2015 58:52:77 EDT Ventricular Rate:  85 PR Interval:  141 QRS Duration: 96 QT Interval:  388 QTC Calculation: 461 R Axis:   33 Text Interpretation:  Sinus rhythm Confirmed by COOK  MD, BRIAN ([id number]) on  06/16/2015 8:49:42 PM      MDM   Final diagnoses:  Chest pain, unspecified chest pain type    Patient has some risk factors for heart disease. Her history is in the gray area for ACS. Screening labs, troponin, EKG, chest x-ray all negative. She has primary care follow-up.  Discussed results with patient, her husband, her daughter. She will return here if worse.  I personally performed the services described in this documentation, which was scribed in my presence. The recorded information has been reviewed and is accurate.   Nat Christen, MD 06/16/15 2253

## 2015-06-16 NOTE — Discharge Instructions (Signed)
Tests were good. Follow-up your primary care doctor. You may need a follow-up with a cardiologist.   Return here if worse.

## 2015-06-16 NOTE — ED Notes (Signed)
Pt reporting CP that began this morning.  Reporting pain in mid chest, and into back.  Reporting associated anxiety, SOB.  Denies nausea.

## 2015-06-27 ENCOUNTER — Encounter: Payer: Self-pay | Admitting: Family Medicine

## 2015-06-27 ENCOUNTER — Ambulatory Visit (INDEPENDENT_AMBULATORY_CARE_PROVIDER_SITE_OTHER): Payer: Medicare Other | Admitting: Family Medicine

## 2015-06-27 VITALS — BP 120/88 | Ht 65.0 in | Wt 153.1 lb

## 2015-06-27 DIAGNOSIS — Z23 Encounter for immunization: Secondary | ICD-10-CM

## 2015-06-27 DIAGNOSIS — R0789 Other chest pain: Secondary | ICD-10-CM | POA: Diagnosis not present

## 2015-06-27 DIAGNOSIS — K219 Gastro-esophageal reflux disease without esophagitis: Secondary | ICD-10-CM

## 2015-06-27 MED ORDER — SUCRALFATE 1 G PO TABS: 1.0000 g | ORAL_TABLET | Freq: Three times a day (TID) | ORAL | Status: DC

## 2015-06-27 NOTE — Progress Notes (Signed)
   Subjective:    Patient ID: Allison Freeman, female    DOB: 1946-02-11, 69 y.o.   MRN: [id number]  HPI Patient is her today for ER follow up visit. Patient was seen at [facility] on 06/16/15 for chest pain. Patient states that all tests came back normal. Patient states that the chest pain has resolved. Patient has heartburn and a sore taste in her mouth.    Pt felt belching and burning and heartburn and similar  Pt drank coke  A lot of it, kept hurting'  Had pain mid chest  And upper mid back  Ate cheese steak subs with sauce and spice and triggered difficultiesa  Patient states that she has no other concerns at this time.   Complete hospital record and all results reviewed in presence of patient  No chest pain no nausea no diaphoresis with exertion and exercise  Review of Systems No current chest pain some reflux no headache no back pain minimal upper abdominal discomfort    Objective:   Physical Exam  Alert vitals stable no acute distress lungs clear. Heart rare rhythm H&T normal abdomen no discrete tenderness      Assessment & Plan:  Impression chest pain. Long discussion held. Likely secondary to esophageal spasm secondary to reflux. Triggered soon after a very saucy meal. Also is having tremendous reflux symptoms. Plan add Carafate short-term maintain other meds warning signs discussed carefully 25 minutes spent most in discussion WSL

## 2015-07-10 ENCOUNTER — Ambulatory Visit (INDEPENDENT_AMBULATORY_CARE_PROVIDER_SITE_OTHER): Payer: Medicare Other | Admitting: Family Medicine

## 2015-07-10 ENCOUNTER — Encounter: Payer: Self-pay | Admitting: Family Medicine

## 2015-07-10 VITALS — BP 134/88 | Temp 98.4°F | Ht 65.0 in | Wt 154.0 lb

## 2015-07-10 DIAGNOSIS — B029 Zoster without complications: Secondary | ICD-10-CM

## 2015-07-10 MED ORDER — VALACYCLOVIR HCL 1 G PO TABS: 1000.0000 mg | ORAL_TABLET | Freq: Three times a day (TID) | ORAL | Status: DC

## 2015-07-10 NOTE — Progress Notes (Signed)
   Subjective:    Patient ID: Allison Freeman, female    DOB: 20-Feb-1946, 69 y.o.   MRN: [id number]  Rash This is a new problem. The current episode started in the past 7 days (3 days ago). Past treatments include nothing.  Burning pain on back and neck. Has had shingles in the past. Pt states pain feels the same as when she had shingles. Had shingles vaccine.   Shoulder discomfort and burning comes and goes worse in the evening associated with rash couple bumpy areas with early blisters. At first thought she had a bite  Review of Systems  Skin: Positive for rash.   No fever no headache ROS otherwise negative    Objective:   Physical Exam   Alert vitals stable no acute distress HEENT normal superior shoulder 2 small erythematous patches raise skin highly sensitive early blistering lungs clear heart regular in rhythm     Assessment & Plan:  Impression likely early shingles discussed plan initiate Valtrex 1 g 3 times a day. Symptomatic care discussed WSL

## 2015-08-28 ENCOUNTER — Other Ambulatory Visit: Payer: Self-pay | Admitting: Family Medicine

## 2015-08-28 NOTE — Telephone Encounter (Signed)
Ok six mo totol

## 2015-08-31 ENCOUNTER — Ambulatory Visit (INDEPENDENT_AMBULATORY_CARE_PROVIDER_SITE_OTHER): Payer: Medicare Other | Admitting: Family Medicine

## 2015-08-31 ENCOUNTER — Encounter: Payer: Self-pay | Admitting: Family Medicine

## 2015-08-31 VITALS — BP 136/84 | Ht 65.0 in | Wt 153.1 lb

## 2015-08-31 DIAGNOSIS — Z79899 Other long term (current) drug therapy: Secondary | ICD-10-CM

## 2015-08-31 DIAGNOSIS — E785 Hyperlipidemia, unspecified: Secondary | ICD-10-CM | POA: Diagnosis not present

## 2015-08-31 DIAGNOSIS — M150 Primary generalized (osteo)arthritis: Secondary | ICD-10-CM | POA: Diagnosis not present

## 2015-08-31 DIAGNOSIS — I10 Essential (primary) hypertension: Secondary | ICD-10-CM

## 2015-08-31 DIAGNOSIS — M159 Polyosteoarthritis, unspecified: Secondary | ICD-10-CM

## 2015-08-31 MED ORDER — TRAMADOL HCL 50 MG PO TABS: ORAL_TABLET | ORAL | Status: DC

## 2015-08-31 NOTE — Patient Instructions (Signed)
Add one otc aleave tab twice per day

## 2015-08-31 NOTE — Progress Notes (Signed)
   Subjective:    Patient ID: Allison Freeman, female    DOB: 1945/09/30, 69 y.o.   MRN: [id number] Patient arrives with numerous concerns. Compliant with blood pressure medication. No obvious side effects. Medications reviewed. Watching salt intake. Gen. does not miss a dose. Next  Compliant with lipid medications. Handling well at this time. Watching her diet. No recent blood work in this regard.  Ongoing hand pain bilateral. Notes all TRAM at 2 per day does not control pain well enough Neck Pain  This is a new problem. The current episode started more than 1 month ago. The problem occurs constantly. The problem has been unchanged. The pain is associated with nothing. The quality of the pain is described as aching. The pain is at a severity of 6/10. Treatments tried: Tramadol.  Hand Pain  The incident occurred more than 1 week ago. There was no injury mechanism. The quality of the pain is described as aching.  Shoulder Pain  This is a new problem. The current episode started 1 to 4 weeks ago. The problem occurs constantly. The symptoms are aggravated by lying down.   of note neck pain stays just mainly ration the neck minimal radiation into the arms.   Patient states no other concerns this visit.  Walks regullly, does not miss reg activity  Tylenol did nt help much  BP meds    Review of Systems  Musculoskeletal: Positive for neck pain.   no abdominal pain no chest pain no change in bowel habits no weight loss no blood in stools ROS otherwise negative     Objective:   Physical Exam  Alert vitals stable. Blood pressure good on repeat. H&T neck positive pain with bilateral rotation hands substantial osteoarthritis noted knees crepitations evident lungs clear heart regular in rhythm blood pressure good on repeat lipids reviewed and old records      Assessment & Plan:  Impression 1 hypertension good control discussed maintain same meds #2 hyperlipidemia good control so far  maintain same meds may change pending blood work results #3 arthritis of multiple areas. No need need for MRI neurosurgeon etc. rationale discussed at great length. Plan increase all TRAM to 3 times a day. Diet exercise discussed maintain same blood pressure medicine lipid medicine. Check blood work. Add 1 Aleve twice a day. Follow-up as scheduled WSL

## 2015-10-17 DIAGNOSIS — N952 Postmenopausal atrophic vaginitis: Secondary | ICD-10-CM | POA: Diagnosis not present

## 2015-10-17 DIAGNOSIS — N3281 Overactive bladder: Secondary | ICD-10-CM | POA: Diagnosis not present

## 2015-10-17 DIAGNOSIS — J399 Disease of upper respiratory tract, unspecified: Secondary | ICD-10-CM | POA: Diagnosis not present

## 2015-10-17 DIAGNOSIS — M8588 Other specified disorders of bone density and structure, other site: Secondary | ICD-10-CM | POA: Diagnosis not present

## 2015-10-17 DIAGNOSIS — Z124 Encounter for screening for malignant neoplasm of cervix: Secondary | ICD-10-CM | POA: Diagnosis not present

## 2015-10-17 DIAGNOSIS — N958 Other specified menopausal and perimenopausal disorders: Secondary | ICD-10-CM | POA: Diagnosis not present

## 2015-10-17 DIAGNOSIS — H6690 Otitis media, unspecified, unspecified ear: Secondary | ICD-10-CM | POA: Diagnosis not present

## 2015-10-17 DIAGNOSIS — Z6825 Body mass index (BMI) 25.0-25.9, adult: Secondary | ICD-10-CM | POA: Diagnosis not present

## 2015-10-19 DIAGNOSIS — Z79899 Other long term (current) drug therapy: Secondary | ICD-10-CM | POA: Diagnosis not present

## 2015-10-19 DIAGNOSIS — E785 Hyperlipidemia, unspecified: Secondary | ICD-10-CM | POA: Diagnosis not present

## 2015-10-20 LAB — LIPID PANEL
CHOL/HDL RATIO: 3.1 ratio (ref 0.0–4.4)
Cholesterol, Total: 169 mg/dL (ref 100–199)
HDL: 54 mg/dL (ref >39)
LDL CALC: 96 mg/dL (ref 0–99)
TRIGLYCERIDES: 97 mg/dL (ref 0–149)
VLDL CHOLESTEROL CAL: 19 mg/dL (ref 5–40)

## 2015-10-20 LAB — HEPATIC FUNCTION PANEL
ALT: 17 IU/L (ref 0–32)
AST: 23 IU/L (ref 0–40)
Albumin: 4 g/dL (ref 3.6–4.8)
Alkaline Phosphatase: 64 IU/L (ref 39–117)
BILIRUBIN TOTAL: 0.8 mg/dL (ref 0.0–1.2)
Bilirubin, Direct: 0.18 mg/dL (ref 0.00–0.40)
Total Protein: 6.3 g/dL (ref 6.0–8.5)

## 2015-10-21 ENCOUNTER — Encounter: Payer: Self-pay | Admitting: Family Medicine

## 2015-10-30 ENCOUNTER — Telehealth: Payer: Self-pay | Admitting: Family Medicine

## 2015-10-30 MED ORDER — ROPINIROLE HCL 1 MG PO TABS: ORAL_TABLET | ORAL | Status: DC

## 2015-10-30 MED ORDER — PRAVASTATIN SODIUM 40 MG PO TABS: ORAL_TABLET | ORAL | Status: DC

## 2015-10-30 MED ORDER — ALPRAZOLAM 0.5 MG PO TABS: 0.5000 mg | ORAL_TABLET | Freq: Every evening | ORAL | Status: DC | PRN

## 2015-10-30 MED ORDER — TRAMADOL HCL 50 MG PO TABS: ORAL_TABLET | ORAL | Status: DC

## 2015-10-30 NOTE — Telephone Encounter (Signed)
Ok six mo on everything

## 2015-10-30 NOTE — Telephone Encounter (Signed)
ALPRAZolam (XANAX) 0.5 MG tablet  traMADol (ULTRAM) 50 MG tablet rOPINIRole (REQUIP) 1 MG tablet pravastatin (PRAVACHOL) 40 MG tablet    Pt needs these refills to be sent to [facility] has changed pharmacy

## 2015-10-30 NOTE — Telephone Encounter (Signed)
[facility] 10/30/15

## 2015-10-30 NOTE — Telephone Encounter (Signed)
Refills sent in on Requip, and Pravastatin per protocol. May we refill Xanax, and Tramadol.

## 2015-10-31 NOTE — Telephone Encounter (Signed)
Spoke with patient and informed her that 6 month worth of refills were sent over for requested medications per Dr.Steve Luking. Patient verbalized understanding.

## 2015-11-19 ENCOUNTER — Encounter: Payer: Self-pay | Admitting: Orthopedic Surgery

## 2015-11-19 ENCOUNTER — Ambulatory Visit (INDEPENDENT_AMBULATORY_CARE_PROVIDER_SITE_OTHER): Payer: Medicare Other | Admitting: Orthopedic Surgery

## 2015-11-19 VITALS — BP 127/74 | Ht 65.0 in | Wt 153.0 lb

## 2015-11-19 DIAGNOSIS — M7071 Other bursitis of hip, right hip: Secondary | ICD-10-CM

## 2015-11-19 DIAGNOSIS — M7072 Other bursitis of hip, left hip: Secondary | ICD-10-CM

## 2015-11-19 NOTE — Progress Notes (Signed)
Chief Complaint  Patient presents with  . Follow-up    follow up bilateral hip bursitis, requests injections    Requests bilateral hip injections greater trochanteric bursitis did well with last injection series  Procedure note injection for   left hip bursitis  Verbal consent was obtained for injection of the  left hip  Timeout was completed to confirm the injection site  The medications used were 40 mg of Depo-Medrol and 1% lidocaine 3 cc  Anesthesia was provided by ethyl chloride and the skin was prepped with alcohol.  After cleaning the skin with alcohol a 25-gauge needle was used to inject the  left bursa of the hip  Procedure note injection for   Right  hip bursitis  Verbal consent was obtained for injection of the  Right  hip  Timeout was completed to confirm the injection site  The medications used were 40 mg of Depo-Medrol and 1% lidocaine 3 cc  Anesthesia was provided by ethyl chloride and the skin was prepped with alcohol.  After cleaning the skin with alcohol a 25-gauge needle was used to inject the  Right  bursa of the hip

## 2015-12-03 ENCOUNTER — Encounter: Payer: Self-pay | Admitting: *Deleted

## 2015-12-10 ENCOUNTER — Ambulatory Visit (INDEPENDENT_AMBULATORY_CARE_PROVIDER_SITE_OTHER): Payer: Medicare Other

## 2015-12-10 ENCOUNTER — Ambulatory Visit (INDEPENDENT_AMBULATORY_CARE_PROVIDER_SITE_OTHER): Payer: Medicare Other | Admitting: Orthopedic Surgery

## 2015-12-10 ENCOUNTER — Encounter: Payer: Self-pay | Admitting: Orthopedic Surgery

## 2015-12-10 VITALS — BP 134/78 | Ht 65.0 in | Wt 153.0 lb

## 2015-12-10 DIAGNOSIS — M79642 Pain in left hand: Secondary | ICD-10-CM

## 2015-12-10 DIAGNOSIS — M180 Bilateral primary osteoarthritis of first carpometacarpal joints: Secondary | ICD-10-CM | POA: Diagnosis not present

## 2015-12-10 DIAGNOSIS — M79641 Pain in right hand: Secondary | ICD-10-CM | POA: Diagnosis not present

## 2015-12-10 NOTE — Progress Notes (Signed)
Chief Complaint  Patient presents with  . Hand Pain    BILATERAL HAND PAIN AND WRIST PAIN   HPI 70 years old status post carpal tunnel release right wrist presents with bilateral thumb pain and aching pain in the wrists and also has a knot on her right knee  She has trouble opening things no numbness or tingling she does have some weakness related to the difficulty opening jars and lids  Pain does not radiate  The pain is been present for several months Review of Systems  HENT: Positive for congestion and tinnitus.   Genitourinary: Positive for frequency.  Musculoskeletal: Positive for back pain and joint pain.  Endo/Heme/Allergies: Positive for environmental allergies.    Past Medical History  Diagnosis Date  . Acid reflux   . Hypertension   . Hypercholesterolemia   . Arthritis   . Depression   . Panic attacks   . COPD (chronic obstructive pulmonary disease) (HCC)     Mild  . Reflux   . Insomnia   . Osteopenia   . Restless leg syndrome     Past Surgical History  Procedure Laterality Date  . Tonsillectomy    . Rectal surgery    . Bladder surgery    . Abdominal hysterectomy  2001  . Knee surgery Right 2003  . Dilation and curettage of uterus    . Cystocele repair  2010  . Rectocele repair  2010  . Breast biopsy  1987  . Breast cyst excision  1998  . Colonoscopy  09/19/2004    [id number] hemorrhoids, otherwise normal rectum/Sigmoid diverticula.  Remainder of colon mucosa appeared normal  . Colonoscopy with esophagogastroduodenoscopy (egd) N/A 06/24/2013    [id number] polyp-removed. Small hiatal hernia. Abnormal gastric mucosa-query NSAID effect-s/p (chronic inactive gastritis, negative H. pylori)/TCS: Prominent internal hemorrhoids-likely source of hematochezia. Colonic diverticulosis. Colonic polyp-removed (tubular adenoma)  . Carpal tunnel release Right 04/06/2015    Procedure: CARPAL TUNNEL RELEASE;  Surgeon: Carole Civil, MD;  Location: [facility];  Service:  Orthopedics;  Laterality: Right;   Family History  Problem Relation Age of Onset  . Cancer Mother     Breast  . Hypertension Father   . Heart attack Father   . Cancer Sister     Breast  . Cancer Sister     breast  . Colon cancer Neg Hx    Social History  Substance Use Topics  . Smoking status: Never Smoker   . Smokeless tobacco: None  . Alcohol Use: No    Current outpatient prescriptions:  .  ALPRAZolam (XANAX) 0.5 MG tablet, Take 1 tablet (0.5 mg total) by mouth at bedtime as needed., Disp: 30 tablet, Rfl: 5 .  aspirin EC 81 MG tablet, Take 81 mg by mouth daily., Disp: , Rfl:  .  fluticasone (FLONASE) 50 MCG/ACT nasal spray, Use 2 sprays in each  nostril once daily, Disp: 48 g, Rfl: 3 .  hydroxypropyl methylcellulose / hypromellose (ISOPTO TEARS / GONIOVISC) 2.5 % ophthalmic solution, Place 1 drop into both eyes 3 (three) times daily., Disp: , Rfl:  .  lisinopril (PRINIVIL,ZESTRIL) 10 MG tablet, Take 1 tablet by mouth  daily, Disp: 90 tablet, Rfl: 0 .  Multiple Vitamin (MULTIVITAMIN) tablet, Take 1 tablet by mouth daily., Disp: , Rfl:  .  MYRBETRIQ 50 MG TB24 tablet, , Disp: , Rfl:  .  Omega-3 Fatty Acids (FISH OIL) 1200 MG CAPS, Take 1 capsule by mouth daily., Disp: , Rfl:  .  pantoprazole (PROTONIX) 40  MG tablet, Take 1 tablet by mouth  twice a day before meals, Disp: 180 tablet, Rfl: 0 .  pravastatin (PRAVACHOL) 40 MG tablet, Take 1 tablet by mouth  daily, Disp: 90 tablet, Rfl: 1 .  rOPINIRole (REQUIP) 1 MG tablet, Take 1 tablet by mouth  daily, Disp: 90 tablet, Rfl: 1 .  sucralfate (CARAFATE) 1 G tablet, Take 1 tablet (1 g total) by mouth 4 (four) times daily -  with meals and at bedtime., Disp: 60 tablet, Rfl: 0 .  traMADol (ULTRAM) 50 MG tablet, Take 1 tablet 3 times a day, Disp: 270 tablet, Rfl: 1 .  valACYclovir (VALTREX) 1000 MG tablet, Take 1 tablet (1,000 mg total) by mouth 3 (three) times daily., Disp: 21 tablet, Rfl: 0  BP 134/78 mmHg  Ht 5\' 5"  (1.651 m)  Wt 153  lb (69.4 kg)  BMI 25.46 kg/m2  Physical Exam  Constitutional: She is oriented to person, place, and time. She appears well-developed and well-nourished. No distress.  Cardiovascular: Normal rate and intact distal pulses.   Neurological: She is alert and oriented to person, place, and time. She has normal reflexes. She exhibits normal muscle tone. Coordination normal.  Skin: Skin is warm and dry. No rash noted. She is not diaphoretic. No erythema. No pallor.  Psychiatric: She has a normal mood and affect. Her behavior is normal. Judgment and thought content normal.    Ortho Exam  Right wrist carpal tunnel incision clean dry and intact no tenderness full range of motion normal grip strength no numbness or tingling and normal sensation right hand normal pulse and perfusion normal capillary refill normal radial pulse weak pinch  Left wrist normal skin clean dry and intact for range of motion normal grip weak pinch no numbness or tingling normal pulse and perfusion normal capillary refill normal radial pulse and weak pinch  Tenderness over both CMC joints with grinding and reproducible pain with joint compression ASSESSMENT:   My personal interpretation of the images:  Bilateral CMC arthritis severe    PLAN   Procedure inject right and left thumb CMC joint  Verbal consent was obtained to inject both CMC joints  Timeout was taken to confirm injection site first on the right. We prepped the skin with alcohol use at the chloride to anesthetize the skin and then injected CMC joint with a 25-gauge needle 40 mg of Depo-Medrol and 3 mL 1% lidocaine. It was well tolerated without complication  We then repeated the same procedure injecting the left [facility] joint

## 2015-12-11 ENCOUNTER — Ambulatory Visit: Payer: Medicare Other | Admitting: Orthopedic Surgery

## 2015-12-26 DIAGNOSIS — L570 Actinic keratosis: Secondary | ICD-10-CM | POA: Diagnosis not present

## 2015-12-26 DIAGNOSIS — L819 Disorder of pigmentation, unspecified: Secondary | ICD-10-CM | POA: Diagnosis not present

## 2015-12-26 DIAGNOSIS — L659 Nonscarring hair loss, unspecified: Secondary | ICD-10-CM | POA: Diagnosis not present

## 2016-01-21 ENCOUNTER — Encounter: Payer: Self-pay | Admitting: Orthopedic Surgery

## 2016-01-21 ENCOUNTER — Ambulatory Visit (INDEPENDENT_AMBULATORY_CARE_PROVIDER_SITE_OTHER): Payer: Medicare Other | Admitting: Orthopedic Surgery

## 2016-01-21 VITALS — BP 136/78 | Ht 65.0 in | Wt 154.0 lb

## 2016-01-21 DIAGNOSIS — M180 Bilateral primary osteoarthritis of first carpometacarpal joints: Secondary | ICD-10-CM | POA: Diagnosis not present

## 2016-01-21 DIAGNOSIS — M79642 Pain in left hand: Secondary | ICD-10-CM | POA: Diagnosis not present

## 2016-01-21 DIAGNOSIS — M79641 Pain in right hand: Secondary | ICD-10-CM | POA: Diagnosis not present

## 2016-01-23 NOTE — Progress Notes (Signed)
Patient ID: Allison Freeman, female   DOB: 1946/09/16, 70 y.o.   MRN: [id number]  Chief Complaint  Patient presents with  . Follow-up    FOLLOW UP BILATERAL HAND PAIN    HPI patient has history of bilateral CMC arthritis which I've splinted and injected. She has not improved still has pain in the [facility] joint and probable twisting off lids or picking up things with the pinch grip  ROS the hand is not numb and does not tingle on either side  BP 136/78 mmHg  Ht 5\' 5"  (1.651 m)  Wt 154 lb (69.854 kg)  BMI 25.63 kg/m2  Physical Exam  Ortho Exam  No gross deformity is seen she has bilateral CMC joint tenderness she has normal range of motion with painful pinch the saddle joint can be subluxated slightly skin is normal pulses are normal and sensation is normal in each hand  ASSESSMENT AND PLAN   CMC arthritis  Referral made

## 2016-01-24 ENCOUNTER — Telehealth: Payer: Self-pay | Admitting: *Deleted

## 2016-01-24 NOTE — Telephone Encounter (Signed)
REFERRED TO [facility] FOR DR Erlinda Hong

## 2016-01-28 ENCOUNTER — Ambulatory Visit (INDEPENDENT_AMBULATORY_CARE_PROVIDER_SITE_OTHER): Payer: Medicare Other | Admitting: Family Medicine

## 2016-01-28 ENCOUNTER — Encounter: Payer: Self-pay | Admitting: Family Medicine

## 2016-01-28 VITALS — BP 150/78 | HR 78 | Temp 98.2°F | Resp 14 | Ht 65.0 in | Wt 154.0 lb

## 2016-01-28 DIAGNOSIS — E785 Hyperlipidemia, unspecified: Secondary | ICD-10-CM

## 2016-01-28 DIAGNOSIS — R350 Frequency of micturition: Secondary | ICD-10-CM

## 2016-01-28 DIAGNOSIS — Z7189 Other specified counseling: Secondary | ICD-10-CM

## 2016-01-28 DIAGNOSIS — I10 Essential (primary) hypertension: Secondary | ICD-10-CM | POA: Diagnosis not present

## 2016-01-28 DIAGNOSIS — M180 Bilateral primary osteoarthritis of first carpometacarpal joints: Secondary | ICD-10-CM | POA: Diagnosis not present

## 2016-01-28 DIAGNOSIS — Z23 Encounter for immunization: Secondary | ICD-10-CM | POA: Diagnosis not present

## 2016-01-28 DIAGNOSIS — K219 Gastro-esophageal reflux disease without esophagitis: Secondary | ICD-10-CM | POA: Diagnosis not present

## 2016-01-28 DIAGNOSIS — Z7689 Persons encountering health services in other specified circumstances: Secondary | ICD-10-CM

## 2016-01-28 LAB — URINALYSIS, ROUTINE W REFLEX MICROSCOPIC
BILIRUBIN URINE: NEGATIVE
GLUCOSE, UA: NEGATIVE
HGB URINE DIPSTICK: NEGATIVE
Ketones, ur: NEGATIVE
Leukocytes, UA: NEGATIVE
Nitrite: NEGATIVE
PROTEIN: NEGATIVE
Specific Gravity, Urine: 1.015 (ref 1.001–1.035)
pH: 7.5 (ref 5.0–8.0)

## 2016-01-28 NOTE — Progress Notes (Signed)
Subjective:    Patient ID: Allison Freeman, female    DOB: 10/22/1945, 70 y.o.   MRN: [id number]  HPI  Patient is here today to establish care. Colonoscopy was performed in 2014. Mammogram is up-to-date. She has a history of a total abdominal hysterectomy and therefore does not require a Pap smear. Immunizations are up-to-date except for Pneumovax 23. She has had a shingles vaccine. We did discuss the tetanus vaccine today. Her bone density is up-to-date. She is due for fasting lab work in August. Past medical history significant for hypertension, hyperlipidemia, gastroesophageal reflux disease requiring twice daily PPI, significant arthritis which she treats with tramadol because she cannot tolerate NSAIDs. Past Medical History  Diagnosis Date  . Acid reflux   . Hypertension   . Hypercholesterolemia   . Arthritis   . Depression   . Panic attacks   . Reflux   . Insomnia   . Osteopenia   . Restless leg syndrome    Past Surgical History  Procedure Laterality Date  . Tonsillectomy    . Rectal surgery    . Bladder surgery    . Abdominal hysterectomy  2001  . Knee surgery Right 2003  . Dilation and curettage of uterus    . Cystocele repair  2010  . Rectocele repair  2010  . Breast biopsy  1987  . Breast cyst excision  1998  . Colonoscopy  09/19/2004    [id number] hemorrhoids, otherwise normal rectum/Sigmoid diverticula.  Remainder of colon mucosa appeared normal  . Colonoscopy with esophagogastroduodenoscopy (egd) N/A 06/24/2013    [id number] polyp-removed. Small hiatal hernia. Abnormal gastric mucosa-query NSAID effect-s/p (chronic inactive gastritis, negative H. pylori)/TCS: Prominent internal hemorrhoids-likely source of hematochezia. Colonic diverticulosis. Colonic polyp-removed (tubular adenoma)  . Carpal tunnel release Right 04/06/2015    Procedure: CARPAL TUNNEL RELEASE;  Surgeon: Carole Civil, MD;  Location: [facility];  Service: Orthopedics;  Laterality: Right;    Current Outpatient Prescriptions on File Prior to Visit  Medication Sig Dispense Refill  . ALPRAZolam (XANAX) 0.5 MG tablet Take 1 tablet (0.5 mg total) by mouth at bedtime as needed. 30 tablet 5  . aspirin EC 81 MG tablet Take 81 mg by mouth daily.    . fluticasone (FLONASE) 50 MCG/ACT nasal spray Use 2 sprays in each  nostril once daily 48 g 3  . hydroxypropyl methylcellulose / hypromellose (ISOPTO TEARS / GONIOVISC) 2.5 % ophthalmic solution Place 1 drop into both eyes 3 (three) times daily.    Marland Kitchen lisinopril (PRINIVIL,ZESTRIL) 10 MG tablet Take 1 tablet by mouth  daily 90 tablet 0  . Multiple Vitamin (MULTIVITAMIN) tablet Take 1 tablet by mouth daily.    Marland Kitchen MYRBETRIQ 50 MG TB24 tablet     . Omega-3 Fatty Acids (FISH OIL) 1200 MG CAPS Take 1 capsule by mouth daily.    . pantoprazole (PROTONIX) 40 MG tablet Take 1 tablet by mouth  twice a day before meals 180 tablet 0  . pravastatin (PRAVACHOL) 40 MG tablet Take 1 tablet by mouth  daily 90 tablet 1  . rOPINIRole (REQUIP) 1 MG tablet Take 1 tablet by mouth  daily 90 tablet 1  . traMADol (ULTRAM) 50 MG tablet Take 1 tablet 3 times a day 270 tablet 1   No current facility-administered medications on file prior to visit.   Allergies  Allergen Reactions  . Other     Arthritis medications- causes a bacteria in the abdomen   Social History   Social History  .  Marital Status: Married    Spouse Name: N/A  . Number of Children: N/A  . Years of Education: N/A   Occupational History  . Not on file.   Social History Main Topics  . Smoking status: Never Smoker   . Smokeless tobacco: Not on file  . Alcohol Use: No  . Drug Use: No  . Sexual Activity: Yes    Birth Control/ Protection: Surgical   Other Topics Concern  . Not on file   Social History Narrative   Family History  Problem Relation Age of Onset  . Cancer Mother     Breast  . Hypertension Father   . Heart attack Father   . Cancer Sister     Breast  . Cancer Sister      breast  . Colon cancer Neg Hx      Review of Systems  All other systems reviewed and are negative.      Objective:   Physical Exam  Constitutional: She is oriented to person, place, and time. She appears well-developed and well-nourished. No distress.  HENT:  Head: Normocephalic and atraumatic.  Right Ear: External ear normal.  Left Ear: External ear normal.  Nose: Nose normal.  Mouth/Throat: Oropharynx is clear and moist. No oropharyngeal exudate.  Eyes: Conjunctivae and EOM are normal. Pupils are equal, round, and reactive to light. Right eye exhibits no discharge. Left eye exhibits no discharge. No scleral icterus.  Neck: Normal range of motion. Neck supple. No JVD present. No tracheal deviation present. No thyromegaly present.  Cardiovascular: Normal rate, regular rhythm, normal heart sounds and intact distal pulses.  Exam reveals no gallop and no friction rub.   No murmur heard. Pulmonary/Chest: Effort normal and breath sounds normal. No stridor. No respiratory distress. She has no wheezes. She has no rales. She exhibits no tenderness.  Abdominal: Soft. Bowel sounds are normal. She exhibits no distension and no mass. There is no tenderness. There is no rebound and no guarding.  Musculoskeletal: Normal range of motion. She exhibits no edema or tenderness.  Lymphadenopathy:    She has no cervical adenopathy.  Neurological: She is alert and oriented to person, place, and time. She has normal reflexes. She displays normal reflexes. No cranial nerve deficit. She exhibits normal muscle tone. Coordination normal.  Skin: Skin is warm. No rash noted. She is not diaphoretic. No erythema. No pallor.  Psychiatric: She has a normal mood and affect. Her behavior is normal. Judgment and thought content normal.  Vitals reviewed.         Assessment & Plan:  Frequent urination - Plan: Urinalysis, Routine w reflex microscopic (not at [facility])  Need for prophylactic vaccination against  Streptococcus pneumoniae (pneumococcus) - Plan: Pneumococcal polysaccharide vaccine 23-valent greater than or equal to 2yo subcutaneous/IM  Hyperlipidemia  Essential hypertension  Gastroesophageal reflux disease without esophagitis  Encounter to establish care with new doctor  Her blood pressure today is elevated. Patient states that she has white coat syndrome. I have asked her to go home and check her blood pressure frequently over the next few weeks and notify me as of the results. I will like her to return fasting in August for a CBC, CMP, fasting lipid panel. Mammogram is up-to-date, colonoscopy is up-to-date, does not require a Pap smear. Bone density is up-to-date. Patient received Pneumovax 23 today in clinic. I will continue to prescribe her tramadol which she uses for arthritis.

## 2016-02-08 DIAGNOSIS — H524 Presbyopia: Secondary | ICD-10-CM | POA: Diagnosis not present

## 2016-02-08 DIAGNOSIS — H52222 Regular astigmatism, left eye: Secondary | ICD-10-CM | POA: Diagnosis not present

## 2016-02-08 DIAGNOSIS — H5203 Hypermetropia, bilateral: Secondary | ICD-10-CM | POA: Diagnosis not present

## 2016-02-08 DIAGNOSIS — H3530 Unspecified macular degeneration: Secondary | ICD-10-CM | POA: Diagnosis not present

## 2016-02-29 ENCOUNTER — Ambulatory Visit: Payer: Medicare Other | Admitting: Family Medicine

## 2016-03-14 ENCOUNTER — Other Ambulatory Visit (HOSPITAL_COMMUNITY)
Admission: RE | Admit: 2016-03-14 | Discharge: 2016-03-14 | Disposition: A | Discharge status: Home / Self Care | Payer: Medicare Other | Source: Skilled Nursing Facility | Attending: Urology | Admitting: Urology

## 2016-03-14 ENCOUNTER — Ambulatory Visit (INDEPENDENT_AMBULATORY_CARE_PROVIDER_SITE_OTHER): Payer: Medicare Other | Admitting: Urology

## 2016-03-14 DIAGNOSIS — R319 Hematuria, unspecified: Secondary | ICD-10-CM | POA: Diagnosis not present

## 2016-03-14 DIAGNOSIS — N3941 Urge incontinence: Secondary | ICD-10-CM | POA: Diagnosis not present

## 2016-03-14 LAB — URINALYSIS, ROUTINE W REFLEX MICROSCOPIC
Bilirubin Urine: NEGATIVE
Glucose, UA: NEGATIVE mg/dL
Hgb urine dipstick: NEGATIVE
KETONES UR: NEGATIVE mg/dL
LEUKOCYTES UA: NEGATIVE
NITRITE: NEGATIVE
PROTEIN: NEGATIVE mg/dL
Specific Gravity, Urine: 1.01 (ref 1.005–1.030)
pH: 7.5 (ref 5.0–8.0)

## 2016-03-19 ENCOUNTER — Ambulatory Visit (INDEPENDENT_AMBULATORY_CARE_PROVIDER_SITE_OTHER): Payer: Medicare Other | Admitting: Physician Assistant

## 2016-03-19 ENCOUNTER — Encounter: Payer: Self-pay | Admitting: Physician Assistant

## 2016-03-19 VITALS — BP 128/86 | HR 84 | Temp 98.7°F | Resp 16 | Ht 65.0 in | Wt 155.0 lb

## 2016-03-19 DIAGNOSIS — H66001 Acute suppurative otitis media without spontaneous rupture of ear drum, right ear: Secondary | ICD-10-CM

## 2016-03-19 MED ORDER — AMOXICILLIN-POT CLAVULANATE 875-125 MG PO TABS: 1.0000 | ORAL_TABLET | Freq: Two times a day (BID) | ORAL | Status: DC

## 2016-03-19 MED ORDER — PANTOPRAZOLE SODIUM 40 MG PO TBEC: DELAYED_RELEASE_TABLET | ORAL | Status: DC

## 2016-03-19 NOTE — Progress Notes (Signed)
Patient ID: Allison Freeman MRN: [id number], DOB: 1945-10-06, 70 y.o. Date of Encounter: 03/19/2016, 4:51 PM    Chief Complaint:  Chief Complaint  Patient presents with  . OTHER    Pt states she has shooting pain in her right ear, worse when she is soing to sleep at night      HPI: 70 y.o. year old white female presents with above.   Says that she saw Dr. Melene Plan about one year ago and he said that she needs tubes in her ears.  Says that her right ear has been popping and feels full. Says that she occasionally feels a shooting pain in her right ear. Says this is been going on for about 2 weeks.  Says that she is not blowing any kind of mucus or drainage from her nose. No sore throat. No fevers or chills. No other symptoms.     Home Meds:   Outpatient Prescriptions Prior to Visit  Medication Sig Dispense Refill  . ALPRAZolam (XANAX) 0.5 MG tablet Take 1 tablet (0.5 mg total) by mouth at bedtime as needed. 30 tablet 5  . aspirin EC 81 MG tablet Take 81 mg by mouth daily.    . fluticasone (FLONASE) 50 MCG/ACT nasal spray Use 2 sprays in each  nostril once daily 48 g 3  . hydroxypropyl methylcellulose / hypromellose (ISOPTO TEARS / GONIOVISC) 2.5 % ophthalmic solution Place 1 drop into both eyes 3 (three) times daily.    . Multiple Vitamin (MULTIVITAMIN) tablet Take 1 tablet by mouth daily.    Marland Kitchen MYRBETRIQ 50 MG TB24 tablet     . Omega-3 Fatty Acids (FISH OIL) 1200 MG CAPS Take 1 capsule by mouth daily.    . pravastatin (PRAVACHOL) 40 MG tablet Take 1 tablet by mouth  daily 90 tablet 1  . rOPINIRole (REQUIP) 1 MG tablet Take 1 tablet by mouth  daily 90 tablet 1  . traMADol (ULTRAM) 50 MG tablet Take 1 tablet 3 times a day 270 tablet 1  . pantoprazole (PROTONIX) 40 MG tablet Take 1 tablet by mouth  twice a day before meals 180 tablet 0  . alendronate (FOSAMAX) 70 MG tablet Take 70 mg by mouth once a week. Reported on 03/19/2016    . lisinopril (PRINIVIL,ZESTRIL) 10 MG tablet Take  1 tablet by mouth  daily (Patient not taking: Reported on 03/19/2016) 90 tablet 0   No facility-administered medications prior to visit.    Allergies:  Allergies  Allergen Reactions  . Other     Arthritis medications- causes a bacteria in the abdomen      Review of Systems: See HPI for pertinent ROS. All other ROS negative.    Physical Exam: Blood pressure 128/86, pulse 84, temperature 98.7 F (37.1 C), temperature source Oral, resp. rate 16, height 5\' 5"  (1.651 m), weight 155 lb (70.308 kg)., Body mass index is 25.79 kg/(m^2). General:  WNWD WF. Appears in no acute distress. HEENT: Normocephalic, atraumatic, eyes without discharge, sclera non-icteric, nares are without discharge. Bilateral auditory canals clear, TM's are without perforation. Left TM is  pearly grey and translucent with reflective cone of light. Right TM: Upper left portion is normal. Lower Right portion: There is effusion. This lower right portion is pink erythema. Oral cavity moist, posterior pharynx without exudate, erythema, peritonsillar abscess. Neck: Supple. No thyromegaly. No lymphadenopathy. Lungs: Clear bilaterally to auscultation without wheezes, rales, or rhonchi. Breathing is unlabored. Heart: Regular rhythm. No murmurs, rubs, or gallops. Msk:  Strength  and tone normal for age. Extremities/Skin: Warm and dry.  Neuro: Alert and oriented X 3. Moves all extremities spontaneously. Gait is normal. CNII-XII grossly in tact. Psych:  Responds to questions appropriately with a normal affect.     ASSESSMENT AND PLAN:  70 y.o. year old female with  1. Acute suppurative otitis media of right ear without spontaneous rupture of tympanic membrane, recurrence not specified Take Augmentin as directed. If symptoms do not resolve needs follow-up with Dr. Melene Plan. - amoxicillin-clavulanate (AUGMENTIN) 875-125 MG tablet; Take 1 tablet by mouth 2 (two) times daily.  Dispense: 20 tablet; Refill: 0   Signed, [address] [place],  [place], [facility] 03/19/2016 4:51 PM

## 2016-03-31 ENCOUNTER — Other Ambulatory Visit: Payer: Self-pay | Admitting: Family Medicine

## 2016-03-31 DIAGNOSIS — Z1231 Encounter for screening mammogram for malignant neoplasm of breast: Secondary | ICD-10-CM

## 2016-04-09 ENCOUNTER — Other Ambulatory Visit: Payer: Medicare Other

## 2016-04-09 DIAGNOSIS — E785 Hyperlipidemia, unspecified: Secondary | ICD-10-CM | POA: Diagnosis not present

## 2016-04-09 DIAGNOSIS — I10 Essential (primary) hypertension: Secondary | ICD-10-CM | POA: Diagnosis not present

## 2016-04-09 LAB — CBC WITH DIFFERENTIAL/PLATELET
BASOS PCT: 0 %
Basophils Absolute: 0 cells/uL (ref 0–200)
Eosinophils Absolute: 98 cells/uL (ref 15–500)
Eosinophils Relative: 2 %
HCT: 37.8 % (ref 35.0–45.0)
Hemoglobin: 12.2 g/dL (ref 12.0–15.0)
LYMPHS PCT: 29 %
Lymphs Abs: 1421 cells/uL (ref 850–3900)
MCH: 28.8 pg (ref 27.0–33.0)
MCHC: 32.3 g/dL (ref 32.0–36.0)
MCV: 89.2 fL (ref 80.0–100.0)
MONO ABS: 392 {cells}/uL (ref 200–950)
MONOS PCT: 8 %
MPV: 10.3 fL (ref 7.5–12.5)
NEUTROS ABS: 2989 {cells}/uL (ref 1500–7800)
Neutrophils Relative %: 61 %
PLATELETS: 242 10*3/uL (ref 140–400)
RBC: 4.24 MIL/uL (ref 3.80–5.10)
RDW: 13.7 % (ref 11.0–15.0)
WBC: 4.9 10*3/uL (ref 3.8–10.8)

## 2016-04-10 LAB — COMPLETE METABOLIC PANEL WITH GFR
ALT: 13 U/L (ref 6–29)
AST: 21 U/L (ref 10–35)
Albumin: 3.8 g/dL (ref 3.6–5.1)
Alkaline Phosphatase: 47 U/L (ref 33–130)
BUN: 14 mg/dL (ref 7–25)
CHLORIDE: 107 mmol/L (ref 98–110)
CO2: 26 mmol/L (ref 20–31)
CREATININE: 1 mg/dL — AB (ref 0.50–0.99)
Calcium: 8.9 mg/dL (ref 8.6–10.4)
GFR, Est African American: 67 mL/min (ref >=60)
GFR, Est Non African American: 58 mL/min — ABNORMAL LOW (ref >=60)
GLUCOSE: 85 mg/dL (ref 70–99)
Potassium: 4.6 mmol/L (ref 3.5–5.3)
Sodium: 141 mmol/L (ref 135–146)
Total Bilirubin: 1.2 mg/dL (ref 0.2–1.2)
Total Protein: 6.7 g/dL (ref 6.1–8.1)

## 2016-04-10 LAB — LIPID PANEL
Cholesterol: 166 mg/dL (ref 125–200)
HDL: 55 mg/dL (ref >=46)
LDL CALC: 88 mg/dL (ref <130)
TRIGLYCERIDES: 115 mg/dL (ref <150)
Total CHOL/HDL Ratio: 3 Ratio (ref <=5.0)
VLDL: 23 mg/dL (ref <30)

## 2016-04-16 ENCOUNTER — Ambulatory Visit (HOSPITAL_COMMUNITY)
Admission: RE | Admit: 2016-04-16 | Discharge: 2016-04-16 | Disposition: A | Discharge status: Home / Self Care | Payer: Medicare Other | Source: Ambulatory Visit | Attending: Family Medicine | Admitting: Family Medicine

## 2016-04-16 DIAGNOSIS — Z1231 Encounter for screening mammogram for malignant neoplasm of breast: Secondary | ICD-10-CM | POA: Insufficient documentation

## 2016-05-05 ENCOUNTER — Ambulatory Visit (INDEPENDENT_AMBULATORY_CARE_PROVIDER_SITE_OTHER): Payer: Medicare Other | Admitting: Otolaryngology

## 2016-05-05 DIAGNOSIS — H903 Sensorineural hearing loss, bilateral: Secondary | ICD-10-CM

## 2016-05-05 DIAGNOSIS — H6983 Other specified disorders of Eustachian tube, bilateral: Secondary | ICD-10-CM | POA: Diagnosis not present

## 2016-05-09 DIAGNOSIS — H6981 Other specified disorders of Eustachian tube, right ear: Secondary | ICD-10-CM | POA: Diagnosis not present

## 2016-05-15 ENCOUNTER — Other Ambulatory Visit: Payer: Self-pay | Admitting: Family Medicine

## 2016-05-16 ENCOUNTER — Other Ambulatory Visit: Payer: Self-pay | Admitting: Family Medicine

## 2016-05-16 NOTE — Telephone Encounter (Signed)
Requesting a refill on her Xanax - Ok to refill??

## 2016-05-17 ENCOUNTER — Other Ambulatory Visit: Payer: Self-pay | Admitting: Family Medicine

## 2016-05-19 MED ORDER — ALPRAZOLAM 0.5 MG PO TABS: 0.5000 mg | ORAL_TABLET | Freq: Every evening | ORAL | 5 refills | Status: DC | PRN

## 2016-05-19 NOTE — Telephone Encounter (Signed)
Medication called to pharmacy. 

## 2016-05-19 NOTE — Telephone Encounter (Signed)
ok 

## 2016-06-03 ENCOUNTER — Other Ambulatory Visit: Payer: Self-pay | Admitting: Family Medicine

## 2016-06-05 ENCOUNTER — Other Ambulatory Visit: Payer: Self-pay | Admitting: Family Medicine

## 2016-06-09 ENCOUNTER — Ambulatory Visit (INDEPENDENT_AMBULATORY_CARE_PROVIDER_SITE_OTHER): Payer: Medicare Other | Admitting: Otolaryngology

## 2016-06-09 DIAGNOSIS — H7201 Central perforation of tympanic membrane, right ear: Secondary | ICD-10-CM

## 2016-06-09 DIAGNOSIS — H6983 Other specified disorders of Eustachian tube, bilateral: Secondary | ICD-10-CM

## 2016-06-17 ENCOUNTER — Other Ambulatory Visit: Payer: Self-pay | Admitting: Family Medicine

## 2016-06-17 NOTE — Telephone Encounter (Signed)
No. Now goes to another prim care doctor

## 2016-06-24 ENCOUNTER — Other Ambulatory Visit: Payer: Self-pay | Admitting: Family Medicine

## 2016-06-27 ENCOUNTER — Ambulatory Visit: Payer: Medicare Other | Admitting: Family Medicine

## 2016-06-27 ENCOUNTER — Other Ambulatory Visit: Payer: Self-pay | Admitting: Family Medicine

## 2016-06-27 ENCOUNTER — Ambulatory Visit (INDEPENDENT_AMBULATORY_CARE_PROVIDER_SITE_OTHER): Payer: Medicare Other | Admitting: Family Medicine

## 2016-06-27 ENCOUNTER — Telehealth: Payer: Self-pay | Admitting: Family Medicine

## 2016-06-27 VITALS — BP 140/90 | HR 80 | Temp 98.4°F | Resp 16 | Ht 65.0 in | Wt 154.0 lb

## 2016-06-27 DIAGNOSIS — Z23 Encounter for immunization: Secondary | ICD-10-CM

## 2016-06-27 DIAGNOSIS — M2550 Pain in unspecified joint: Secondary | ICD-10-CM

## 2016-06-27 DIAGNOSIS — K219 Gastro-esophageal reflux disease without esophagitis: Secondary | ICD-10-CM | POA: Diagnosis not present

## 2016-06-27 MED ORDER — ROPINIROLE HCL 1 MG PO TABS: ORAL_TABLET | ORAL | 3 refills | Status: DC

## 2016-06-27 MED ORDER — RANITIDINE HCL 300 MG PO TABS: 300.0000 mg | ORAL_TABLET | Freq: Every day | ORAL | 5 refills | Status: DC

## 2016-06-27 NOTE — Telephone Encounter (Signed)
Requesting refill on Tramadol - Ok to refill??      (LRF - 11/09/15 for 270 tablets = 3 months supply)

## 2016-06-27 NOTE — Telephone Encounter (Signed)
ok 

## 2016-06-27 NOTE — Progress Notes (Signed)
Subjective:    Patient ID: MAC DARRIN, female    DOB: 13-Sep-1946, 70 y.o.   MRN: [id number]  HPI   01/2016 Patient is here today to establish care. Colonoscopy was performed in 2014. Mammogram is up-to-date. She has a history of a total abdominal hysterectomy and therefore does not require a Pap smear. Immunizations are up-to-date except for Pneumovax 23. She has had a shingles vaccine. We did discuss the tetanus vaccine today. Her bone density is up-to-date. She is due for fasting lab work in August. Past medical history significant for hypertension, hyperlipidemia, gastroesophageal reflux disease requiring twice daily PPI, significant arthritis which she treats with tramadol because she cannot tolerate NSAIDs.  At that time, my plan was: Her blood pressure today is elevated. Patient states that she has white coat syndrome. I have asked her to go home and check her blood pressure frequently over the next few weeks and notify me as of the results. I will like her to return fasting in August for a CBC, CMP, fasting lipid panel. Mammogram is up-to-date, colonoscopy is up-to-date, does not require a Pap smear. Bone density is up-to-date. Patient received Pneumovax 23 today in clinic. I will continue to prescribe her tramadol which she uses for arthritis.  06/27/16 Patient presents today complaining of severe refractory heartburn. She is having on a daily basis even though she is taking protonix 40 mg twice daily.  She has tried sucralfate in the past without benefit.  The patient had an EGD in 2014 that was normal. She denies any blood in her stool or melena. She is also concerned because she is developed severe pain in multiple joints simultaneously. She is now complaining of pain in both shoulders, both hips, both knees. Each is severe and is not improved with tramadol.  SHe denies fevers or chills or weight loss or rashes  Past Medical History:  Diagnosis Date  . Acid reflux   . Arthritis     . Depression   . Hypercholesterolemia   . Hypertension   . Insomnia   . Osteopenia   . Panic attacks   . Reflux   . Restless leg syndrome    Past Surgical History:  Procedure Laterality Date  . ABDOMINAL HYSTERECTOMY  2001  . BLADDER SURGERY    . BREAST BIOPSY  1987  . BREAST CYST EXCISION  1998  . CARPAL TUNNEL RELEASE Right 04/06/2015   Procedure: CARPAL TUNNEL RELEASE;  Surgeon: Carole Civil, MD;  Location: [facility];  Service: Orthopedics;  Laterality: Right;  . COLONOSCOPY  09/19/2004   [id number] hemorrhoids, otherwise normal rectum/Sigmoid diverticula.  Remainder of colon mucosa appeared normal  . COLONOSCOPY WITH ESOPHAGOGASTRODUODENOSCOPY (EGD) N/A 06/24/2013   [id number] polyp-removed. Small hiatal hernia. Abnormal gastric mucosa-query NSAID effect-s/p (chronic inactive gastritis, negative H. pylori)/TCS: Prominent internal hemorrhoids-likely source of hematochezia. Colonic diverticulosis. Colonic polyp-removed (tubular adenoma)  . CYSTOCELE REPAIR  2010  . DILATION AND CURETTAGE OF UTERUS    . KNEE SURGERY Right 2003  . RECTAL SURGERY    . RECTOCELE REPAIR  2010  . TONSILLECTOMY     Current Outpatient Prescriptions on File Prior to Visit  Medication Sig Dispense Refill  . alendronate (FOSAMAX) 70 MG tablet Take 70 mg by mouth once a week. Reported on 03/19/2016    . ALPRAZolam (XANAX) 0.5 MG tablet Take 1 tablet (0.5 mg total) by mouth at bedtime as needed. 30 tablet 5  . amoxicillin-clavulanate (AUGMENTIN) 875-125 MG tablet Take 1 tablet by  mouth 2 (two) times daily. 20 tablet 0  . aspirin EC 81 MG tablet Take 81 mg by mouth daily.    . fluticasone (FLONASE) 50 MCG/ACT nasal spray Use 2 sprays in each  nostril once daily 48 g 3  . hydroxypropyl methylcellulose / hypromellose (ISOPTO TEARS / GONIOVISC) 2.5 % ophthalmic solution Place 1 drop into both eyes 3 (three) times daily.    Marland Kitchen lisinopril (PRINIVIL,ZESTRIL) 10 MG tablet Take 1 tablet by mouth  daily (Patient  not taking: Reported on 03/19/2016) 90 tablet 0  . Multiple Vitamin (MULTIVITAMIN) tablet Take 1 tablet by mouth daily.    Marland Kitchen MYRBETRIQ 50 MG TB24 tablet     . Omega-3 Fatty Acids (FISH OIL) 1200 MG CAPS Take 1 capsule by mouth daily.    . pantoprazole (PROTONIX) 40 MG tablet Take 1 tablet by mouth  twice a day before meals 180 tablet 2  . pravastatin (PRAVACHOL) 40 MG tablet Take 1 tablet by mouth  daily 90 tablet 1  . traMADol (ULTRAM) 50 MG tablet Take 1 tablet 3 times a day 270 tablet 1   No current facility-administered medications on file prior to visit.    Allergies  Allergen Reactions  . Other     Arthritis medications- causes a bacteria in the abdomen   Social History   Social History  . Marital status: Married    Spouse name: N/A  . Number of children: N/A  . Years of education: N/A   Occupational History  . Not on file.   Social History Main Topics  . Smoking status: Never Smoker  . Smokeless tobacco: Not on file  . Alcohol use No  . Drug use: No  . Sexual activity: Yes    Birth control/ protection: Surgical   Other Topics Concern  . Not on file   Social History Narrative  . No narrative on file   Family History  Problem Relation Age of Onset  . Cancer Mother     Breast  . Hypertension Father   . Heart attack Father   . Cancer Sister     Breast  . Cancer Sister     breast  . Colon cancer Neg Hx      Review of Systems  All other systems reviewed and are negative.      Objective:   Physical Exam  Constitutional: She is oriented to person, place, and time. She appears well-developed and well-nourished. No distress.  HENT:  Head: Normocephalic and atraumatic.  Right Ear: External ear normal.  Left Ear: External ear normal.  Nose: Nose normal.  Mouth/Throat: Oropharynx is clear and moist. No oropharyngeal exudate.  Eyes: Conjunctivae and EOM are normal. Pupils are equal, round, and reactive to light. Right eye exhibits no discharge. Left eye  exhibits no discharge. No scleral icterus.  Neck: Normal range of motion. Neck supple. No JVD present. No tracheal deviation present. No thyromegaly present.  Cardiovascular: Normal rate, regular rhythm, normal heart sounds and intact distal pulses.  Exam reveals no gallop and no friction rub.   No murmur heard. Pulmonary/Chest: Effort normal and breath sounds normal. No stridor. No respiratory distress. She has no wheezes. She has no rales. She exhibits no tenderness.  Abdominal: Soft. Bowel sounds are normal. She exhibits no distension and no mass. There is no tenderness. There is no rebound and no guarding.  Musculoskeletal: Normal range of motion. She exhibits no edema or tenderness.  Lymphadenopathy:    She has no cervical adenopathy.  Neurological: She is alert and oriented to person, place, and time. She has normal reflexes. No cranial nerve deficit. She exhibits normal muscle tone. Coordination normal.  Skin: Skin is warm. No rash noted. She is not diaphoretic. No erythema. No pallor.  Psychiatric: She has a normal mood and affect. Her behavior is normal. Judgment and thought content normal.  Vitals reviewed.         Assessment & Plan:  Gastroesophageal reflux disease without esophagitis - Plan: ranitidine (ZANTAC) 300 MG tablet  Polyarthralgia - Plan: CK, CBC with Differential/Platelet, Sedimentation rate, Rheumatoid factor, ANA  Continue PPI twice daily but supplement with Zantac 300 milligrams by mouth daily at bedtime and reassess in 2 weeks. Given the symmetric bilateral diffuse arthralgias, I will check a sedimentation rate to evaluate for autoimmune diseases, I'll check a CK to evaluate for myositis, I'll check an ANA, and I will check a rheumatoid factor. I have asked the patient to temporarily discontinue pravastatin. Recheck in one week

## 2016-06-27 NOTE — Addendum Note (Signed)
Addended by: Shary Decamp B on: 06/27/2016 02:42 PM   Modules accepted: Orders

## 2016-06-28 LAB — CK: Total CK: 135 U/L (ref 7–177)

## 2016-06-28 LAB — CBC WITH DIFFERENTIAL/PLATELET
BASOS PCT: 1 %
Basophils Absolute: 55 cells/uL (ref 0–200)
EOS ABS: 110 {cells}/uL (ref 15–500)
Eosinophils Relative: 2 %
HEMATOCRIT: 35.3 % (ref 35.0–45.0)
Hemoglobin: 11.7 g/dL — ABNORMAL LOW (ref 12.0–15.0)
Lymphocytes Relative: 36 %
Lymphs Abs: 1980 cells/uL (ref 850–3900)
MCH: 28.9 pg (ref 27.0–33.0)
MCHC: 33.1 g/dL (ref 32.0–36.0)
MCV: 87.2 fL (ref 80.0–100.0)
MONO ABS: 495 {cells}/uL (ref 200–950)
MPV: 10.7 fL (ref 7.5–12.5)
Monocytes Relative: 9 %
NEUTROS ABS: 2860 {cells}/uL (ref 1500–7800)
Neutrophils Relative %: 52 %
Platelets: 248 10*3/uL (ref 140–400)
RBC: 4.05 MIL/uL (ref 3.80–5.10)
RDW: 14.1 % (ref 11.0–15.0)
WBC: 5.5 10*3/uL (ref 3.8–10.8)

## 2016-06-28 LAB — RHEUMATOID FACTOR: Rhuematoid fact SerPl-aCnc: <14 IU/mL (ref <14)

## 2016-06-28 LAB — SEDIMENTATION RATE: Sed Rate: 13 mm/hr (ref 0–30)

## 2016-06-30 LAB — ANA: ANA: NEGATIVE

## 2016-06-30 MED ORDER — TRAMADOL HCL 50 MG PO TABS: ORAL_TABLET | ORAL | 0 refills | Status: DC

## 2016-06-30 NOTE — Telephone Encounter (Signed)
Medication called/sent to requested pharmacy  

## 2016-07-03 ENCOUNTER — Ambulatory Visit (INDEPENDENT_AMBULATORY_CARE_PROVIDER_SITE_OTHER): Payer: Medicare Other | Admitting: Family Medicine

## 2016-07-03 ENCOUNTER — Encounter: Payer: Self-pay | Admitting: Family Medicine

## 2016-07-03 VITALS — BP 146/82 | HR 68 | Temp 98.2°F | Resp 16 | Ht 65.0 in | Wt 155.0 lb

## 2016-07-03 DIAGNOSIS — R300 Dysuria: Secondary | ICD-10-CM

## 2016-07-03 LAB — URINALYSIS, ROUTINE W REFLEX MICROSCOPIC
BILIRUBIN URINE: NEGATIVE
GLUCOSE, UA: NEGATIVE
HGB URINE DIPSTICK: NEGATIVE
KETONES UR: NEGATIVE
Leukocytes, UA: NEGATIVE
Nitrite: NEGATIVE
PROTEIN: NEGATIVE
Specific Gravity, Urine: <1.005 (ref 1.001–1.035)
pH: 5.5 (ref 5.0–8.0)

## 2016-07-03 MED ORDER — CIPROFLOXACIN HCL 500 MG PO TABS: 500.0000 mg | ORAL_TABLET | Freq: Two times a day (BID) | ORAL | 0 refills | Status: DC

## 2016-07-03 NOTE — Progress Notes (Signed)
Subjective:    Patient ID: Allison Freeman, female    DOB: 07-07-1946, 70 y.o.   MRN: [id number]  HPI  She reports several days of dysuria, frequency, urgency, and hesitancy. Urinalysis is benign. Pain has not improved since last visit since holding her statin medication Past Medical History:  Diagnosis Date  . Acid reflux   . Arthritis   . Depression   . Hypercholesterolemia   . Hypertension   . Insomnia   . Osteopenia   . Panic attacks   . Reflux   . Restless leg syndrome    Past Surgical History:  Procedure Laterality Date  . ABDOMINAL HYSTERECTOMY  2001  . BLADDER SURGERY    . BREAST BIOPSY  1987  . BREAST CYST EXCISION  1998  . CARPAL TUNNEL RELEASE Right 04/06/2015   Procedure: CARPAL TUNNEL RELEASE;  Surgeon: Carole Civil, MD;  Location: [facility];  Service: Orthopedics;  Laterality: Right;  . COLONOSCOPY  09/19/2004   [id number] hemorrhoids, otherwise normal rectum/Sigmoid diverticula.  Remainder of colon mucosa appeared normal  . COLONOSCOPY WITH ESOPHAGOGASTRODUODENOSCOPY (EGD) N/A 06/24/2013   [id number] polyp-removed. Small hiatal hernia. Abnormal gastric mucosa-query NSAID effect-s/p (chronic inactive gastritis, negative H. pylori)/TCS: Prominent internal hemorrhoids-likely source of hematochezia. Colonic diverticulosis. Colonic polyp-removed (tubular adenoma)  . CYSTOCELE REPAIR  2010  . DILATION AND CURETTAGE OF UTERUS    . KNEE SURGERY Right 2003  . RECTAL SURGERY    . RECTOCELE REPAIR  2010  . TONSILLECTOMY     Current Outpatient Prescriptions on File Prior to Visit  Medication Sig Dispense Refill  . alendronate (FOSAMAX) 70 MG tablet Take 70 mg by mouth once a week. Reported on 03/19/2016    . ALPRAZolam (XANAX) 0.5 MG tablet Take 1 tablet (0.5 mg total) by mouth at bedtime as needed. 30 tablet 5  . aspirin EC 81 MG tablet Take 81 mg by mouth daily.    . fluticasone (FLONASE) 50 MCG/ACT nasal spray Use 2 sprays in each  nostril once daily 48 g 3    . hydroxypropyl methylcellulose / hypromellose (ISOPTO TEARS / GONIOVISC) 2.5 % ophthalmic solution Place 1 drop into both eyes 3 (three) times daily.    . Multiple Vitamin (MULTIVITAMIN) tablet Take 1 tablet by mouth daily.    Marland Kitchen MYRBETRIQ 50 MG TB24 tablet Take 50 mg by mouth daily.     . Omega-3 Fatty Acids (FISH OIL) 1200 MG CAPS Take 1 capsule by mouth daily.    . pantoprazole (PROTONIX) 40 MG tablet Take 1 tablet by mouth  twice a day before meals 180 tablet 2  . ranitidine (ZANTAC) 300 MG tablet Take 1 tablet (300 mg total) by mouth at bedtime. 30 tablet 5  . rOPINIRole (REQUIP) 1 MG tablet Take 1 tablet by mouth  daily 90 tablet 3  . traMADol (ULTRAM) 50 MG tablet Take 1 tablet 3 times a day 270 tablet 0  . pravastatin (PRAVACHOL) 40 MG tablet Take 1 tablet by mouth  daily (Patient not taking: Reported on 07/03/2016) 90 tablet 1   No current facility-administered medications on file prior to visit.    Allergies  Allergen Reactions  . Other     Arthritis medications- causes a bacteria in the abdomen   Social History   Social History  . Marital status: Married    Spouse name: N/A  . Number of children: N/A  . Years of education: N/A   Occupational History  . Not on file.  Social History Main Topics  . Smoking status: Never Smoker  . Smokeless tobacco: Not on file  . Alcohol use No  . Drug use: No  . Sexual activity: Yes    Birth control/ protection: Surgical   Other Topics Concern  . Not on file   Social History Narrative  . No narrative on file   Family History  Problem Relation Age of Onset  . Cancer Mother     Breast  . Hypertension Father   . Heart attack Father   . Cancer Sister     Breast  . Cancer Sister     breast  . Colon cancer Neg Hx      Review of Systems  All other systems reviewed and are negative.      Objective:   Physical Exam  Cardiovascular: Normal rate, regular rhythm and normal heart sounds.   Pulmonary/Chest: Effort normal  and breath sounds normal. No respiratory distress. She has no wheezes. She has no rales.  Abdominal: Soft. Bowel sounds are normal. She exhibits no distension. There is no tenderness. There is no rebound.  Vitals reviewed.         Assessment & Plan:  Burning with urination - Plan: Urinalysis, Routine w reflex microscopic (not at [facility]), ciprofloxacin (CIPRO) 500 MG tablet  The patient's symptoms sound like a urinary tract infection. I will treat her symptomatically with Cipro 500 mg by mouth twice a day for 3 days. I will send a urine culture. Reassess next week if symptoms are not better. Give 1 more week. If pain has not improved drastically at that point after stopping statin medication, I will resume statin medication and upper tramadol to 100 mg 3 times a day

## 2016-07-03 NOTE — Addendum Note (Signed)
Addended by: Shary Decamp B on: 07/03/2016 04:58 PM   Modules accepted: Orders

## 2016-07-05 LAB — URINE CULTURE

## 2016-07-11 ENCOUNTER — Telehealth: Payer: Self-pay | Admitting: Family Medicine

## 2016-07-11 NOTE — Telephone Encounter (Signed)
Pt states that she is much better off the cholesterol medication even her shoulders stopped hurting and would like to know what you want her to do for her cholesterol?

## 2016-07-14 MED ORDER — EZETIMIBE 10 MG PO TABS: 10.0000 mg | ORAL_TABLET | Freq: Every day | ORAL | 3 refills | Status: DC

## 2016-07-14 NOTE — Telephone Encounter (Signed)
Pt aware, med sent to pharm and 3 month appt made

## 2016-07-14 NOTE — Telephone Encounter (Signed)
I would try zetia 10 mg poqday

## 2016-08-12 ENCOUNTER — Encounter: Payer: Self-pay | Admitting: Family Medicine

## 2016-10-15 ENCOUNTER — Other Ambulatory Visit: Payer: Self-pay | Admitting: Family Medicine

## 2016-10-15 NOTE — Telephone Encounter (Signed)
LRF 06/30/16 # 270 90 day supply.  LOV 07/03/16  Has appt 10/17/16.  OK refill?

## 2016-10-16 MED ORDER — TRAMADOL HCL 50 MG PO TABS: ORAL_TABLET | ORAL | 0 refills | Status: DC

## 2016-10-16 NOTE — Telephone Encounter (Signed)
RX called in .

## 2016-10-16 NOTE — Telephone Encounter (Signed)
ok 

## 2016-10-17 ENCOUNTER — Encounter: Payer: Self-pay | Admitting: Family Medicine

## 2016-10-17 ENCOUNTER — Ambulatory Visit (INDEPENDENT_AMBULATORY_CARE_PROVIDER_SITE_OTHER): Payer: Medicare Other | Admitting: Family Medicine

## 2016-10-17 VITALS — BP 166/100 | HR 84 | Temp 98.4°F | Resp 16 | Ht 65.0 in | Wt 155.0 lb

## 2016-10-17 DIAGNOSIS — E7800 Pure hypercholesterolemia, unspecified: Secondary | ICD-10-CM

## 2016-10-17 DIAGNOSIS — M2550 Pain in unspecified joint: Secondary | ICD-10-CM

## 2016-10-17 LAB — CBC WITH DIFFERENTIAL/PLATELET
BASOS ABS: 43 {cells}/uL (ref 0–200)
Basophils Relative: 1 %
EOS ABS: 86 {cells}/uL (ref 15–500)
Eosinophils Relative: 2 %
HEMATOCRIT: 35.5 % (ref 35.0–45.0)
HEMOGLOBIN: 11.6 g/dL — AB (ref 12.0–15.0)
LYMPHS ABS: 1505 {cells}/uL (ref 850–3900)
LYMPHS PCT: 35 %
MCH: 28.9 pg (ref 27.0–33.0)
MCHC: 32.7 g/dL (ref 32.0–36.0)
MCV: 88.3 fL (ref 80.0–100.0)
MONO ABS: 344 {cells}/uL (ref 200–950)
MPV: 9.7 fL (ref 7.5–12.5)
Monocytes Relative: 8 %
NEUTROS PCT: 54 %
Neutro Abs: 2322 cells/uL (ref 1500–7800)
Platelets: 281 10*3/uL (ref 140–400)
RBC: 4.02 MIL/uL (ref 3.80–5.10)
RDW: 13.7 % (ref 11.0–15.0)
WBC: 4.3 10*3/uL (ref 3.8–10.8)

## 2016-10-17 NOTE — Progress Notes (Signed)
Subjective:    Patient ID: Allison Freeman, female    DOB: 09-Dec-1945, 71 y.o.   MRN: [id number]  HPI   01/2016 Patient is here today to establish care. Colonoscopy was performed in 2014. Mammogram is up-to-date. She has a history of a total abdominal hysterectomy and therefore does not require a Pap smear. Immunizations are up-to-date except for Pneumovax 23. She has had a shingles vaccine. We did discuss the tetanus vaccine today. Her bone density is up-to-date. She is due for fasting lab work in August. Past medical history significant for hypertension, hyperlipidemia, gastroesophageal reflux disease requiring twice daily PPI, significant arthritis which she treats with tramadol because she cannot tolerate NSAIDs.  At that time, my plan was: Her blood pressure today is elevated. Patient states that she has white coat syndrome. I have asked her to go home and check her blood pressure frequently over the next few weeks and notify me as of the results. I will like her to return fasting in August for a CBC, CMP, fasting lipid panel. Mammogram is up-to-date, colonoscopy is up-to-date, does not require a Pap smear. Bone density is up-to-date. Patient received Pneumovax 23 today in clinic. I will continue to prescribe her tramadol which she uses for arthritis.  06/27/16 Patient presents today complaining of severe refractory heartburn. She is having on a daily basis even though she is taking protonix 40 mg twice daily.  She has tried sucralfate in the past without benefit.  The patient had an EGD in 2014 that was normal. She denies any blood in her stool or melena. She is also concerned because she is developed severe pain in multiple joints simultaneously. She is now complaining of pain in both shoulders, both hips, both knees. Each is severe and is not improved with tramadol.  SHe denies fevers or chills or weight loss or rashes.  AT that time, my plan was: Continue PPI twice daily but supplement with  Zantac 300 milligrams by mouth daily at bedtime and reassess in 2 weeks. Given the symmetric bilateral diffuse arthralgias, I will check a sedimentation rate to evaluate for autoimmune diseases, I'll check a CK to evaluate for myositis, I'll check an ANA, and I will check a rheumatoid factor. I have asked the patient to temporarily discontinue pravastatin. Recheck in one week  10/17/16 Continues to have pain in her lower back, the lateral aspects of both hips, and both knees. It hurts to sleep on either hip similar to bursitis in the hips. She also has pain with ambulation. She reports bilateral knee pain. Patient had an x-ray in 2014 of her lower back which revealed facet arthritis at L4-L5.  She has not had any x-rays of her hips or her knees. Initial lab work including a CK, sedimentation rate, and ANA were negative for any autoimmune disease. She continues to have to take the tramadol 3 times a day. Given the diffuse nature of her pain, fibromyalgia is also a possibility. Her blood pressure today is extremely high. She states that she checks her blood pressure once a week and it's fine. She has been rushing to get here this morning. She is also overdue to recheck her cholesterol when she stopped her pravastatin, her muscle aches and hip pain and joint pains did not improve.  Past Medical History:  Diagnosis Date  . Acid reflux   . Arthritis   . Depression   . Hypercholesterolemia   . Hypertension   . Insomnia   . Osteopenia   .  Panic attacks   . Reflux   . Restless leg syndrome    Past Surgical History:  Procedure Laterality Date  . ABDOMINAL HYSTERECTOMY  2001  . BLADDER SURGERY    . BREAST BIOPSY  1987  . BREAST CYST EXCISION  1998  . CARPAL TUNNEL RELEASE Right 04/06/2015   Procedure: CARPAL TUNNEL RELEASE;  Surgeon: Carole Civil, MD;  Location: [facility];  Service: Orthopedics;  Laterality: Right;  . COLONOSCOPY  09/19/2004   [id number] hemorrhoids, otherwise normal  rectum/Sigmoid diverticula.  Remainder of colon mucosa appeared normal  . COLONOSCOPY WITH ESOPHAGOGASTRODUODENOSCOPY (EGD) N/A 06/24/2013   [id number] polyp-removed. Small hiatal hernia. Abnormal gastric mucosa-query NSAID effect-s/p (chronic inactive gastritis, negative H. pylori)/TCS: Prominent internal hemorrhoids-likely source of hematochezia. Colonic diverticulosis. Colonic polyp-removed (tubular adenoma)  . CYSTOCELE REPAIR  2010  . DILATION AND CURETTAGE OF UTERUS    . KNEE SURGERY Right 2003  . RECTAL SURGERY    . RECTOCELE REPAIR  2010  . TONSILLECTOMY     Current Outpatient Prescriptions on File Prior to Visit  Medication Sig Dispense Refill  . alendronate (FOSAMAX) 70 MG tablet Take 70 mg by mouth once a week. Reported on 03/19/2016    . ALPRAZolam (XANAX) 0.5 MG tablet Take 1 tablet (0.5 mg total) by mouth at bedtime as needed. 30 tablet 5  . aspirin EC 81 MG tablet Take 81 mg by mouth daily.    Marland Kitchen ezetimibe (ZETIA) 10 MG tablet Take 1 tablet (10 mg total) by mouth daily. 90 tablet 3  . fluticasone (FLONASE) 50 MCG/ACT nasal spray Use 2 sprays in each  nostril once daily 48 g 3  . hydroxypropyl methylcellulose / hypromellose (ISOPTO TEARS / GONIOVISC) 2.5 % ophthalmic solution Place 1 drop into both eyes 3 (three) times daily.    . Multiple Vitamin (MULTIVITAMIN) tablet Take 1 tablet by mouth daily.    Marland Kitchen MYRBETRIQ 50 MG TB24 tablet Take 50 mg by mouth daily.     . Omega-3 Fatty Acids (FISH OIL) 1200 MG CAPS Take 1 capsule by mouth daily.    . pantoprazole (PROTONIX) 40 MG tablet Take 1 tablet by mouth  twice a day before meals 180 tablet 2  . ranitidine (ZANTAC) 300 MG tablet Take 1 tablet (300 mg total) by mouth at bedtime. 30 tablet 5  . rOPINIRole (REQUIP) 1 MG tablet Take 1 tablet by mouth  daily 90 tablet 3  . traMADol (ULTRAM) 50 MG tablet Take 1 tablet 3 times a day 270 tablet 0  . pravastatin (PRAVACHOL) 40 MG tablet Take 1 tablet by mouth  daily (Patient not taking:  Reported on 10/17/2016) 90 tablet 1   No current facility-administered medications on file prior to visit.    Allergies  Allergen Reactions  . Other     Arthritis medications- causes a bacteria in the abdomen   Social History   Social History  . Marital status: Married    Spouse name: N/A  . Number of children: N/A  . Years of education: N/A   Occupational History  . Not on file.   Social History Main Topics  . Smoking status: Never Smoker  . Smokeless tobacco: Not on file  . Alcohol use No  . Drug use: No  . Sexual activity: Yes    Birth control/ protection: Surgical   Other Topics Concern  . Not on file   Social History Narrative  . No narrative on file   Family History  Problem Relation Age  of Onset  . Cancer Mother     Breast  . Hypertension Father   . Heart attack Father   . Cancer Sister     Breast  . Cancer Sister     breast  . Colon cancer Neg Hx      Review of Systems  All other systems reviewed and are negative.      Objective:   Physical Exam  Constitutional: She is oriented to person, place, and time. She appears well-developed and well-nourished. No distress.  HENT:  Head: Normocephalic and atraumatic.  Right Ear: External ear normal.  Left Ear: External ear normal.  Nose: Nose normal.  Mouth/Throat: Oropharynx is clear and moist. No oropharyngeal exudate.  Eyes: Conjunctivae and EOM are normal. Pupils are equal, round, and reactive to light. Right eye exhibits no discharge. Left eye exhibits no discharge. No scleral icterus.  Neck: Normal range of motion. Neck supple. No JVD present. No tracheal deviation present. No thyromegaly present.  Cardiovascular: Normal rate, regular rhythm, normal heart sounds and intact distal pulses.  Exam reveals no gallop and no friction rub.   No murmur heard. Pulmonary/Chest: Effort normal and breath sounds normal. No stridor. No respiratory distress. She has no wheezes. She has no rales. She exhibits no  tenderness.  Abdominal: Soft. Bowel sounds are normal. She exhibits no distension and no mass. There is no tenderness. There is no rebound and no guarding.  Musculoskeletal: Normal range of motion. She exhibits no edema or tenderness.  Lymphadenopathy:    She has no cervical adenopathy.  Neurological: She is alert and oriented to person, place, and time. She has normal reflexes. No cranial nerve deficit. She exhibits normal muscle tone. Coordination normal.  Skin: Skin is warm. No rash noted. She is not diaphoretic. No erythema. No pallor.  Psychiatric: She has a normal mood and affect. Her behavior is normal. Judgment and thought content normal.  Vitals reviewed.         Assessment & Plan:  Polyarthralgia - Plan: DG HIP UNILAT WITH PELVIS 2-3 VIEWS LEFT  Pure hypercholesterolemia - Plan: COMPLETE METABOLIC PANEL WITH GFR, CBC with Differential/Platelet, Lipid panel  Perform x-ray of the left hip. This is the worst joint pain for the patient. If the x-ray confirms arthritis, we could treat it with possible cortisone injections. If there is not substantial arthritis in the hip, I'm starting to question the patient may have fibromyalgia and would consider trying her on tramadol to try to help manage the pain better. The patient will check her blood pressure everyday for the next week and provide me the values. If consistently elevated she will need to resume antihypertensive medication. I will also check a fasting lipid panel today. Goal LDL is less than 1:30 and is close to 100 as possible.

## 2016-10-18 LAB — COMPLETE METABOLIC PANEL WITH GFR
ALT: 14 U/L (ref 6–29)
AST: 20 U/L (ref 10–35)
Albumin: 3.6 g/dL (ref 3.6–5.1)
Alkaline Phosphatase: 56 U/L (ref 33–130)
BUN: 25 mg/dL (ref 7–25)
CHLORIDE: 105 mmol/L (ref 98–110)
CO2: 25 mmol/L (ref 20–31)
CREATININE: 0.95 mg/dL (ref 0.50–0.99)
Calcium: 9.2 mg/dL (ref 8.6–10.4)
GFR, Est African American: 71 mL/min (ref >=60)
GFR, Est Non African American: 62 mL/min (ref >=60)
GLUCOSE: 92 mg/dL (ref 70–99)
POTASSIUM: 4.3 mmol/L (ref 3.5–5.3)
SODIUM: 140 mmol/L (ref 135–146)
Total Bilirubin: 0.8 mg/dL (ref 0.2–1.2)
Total Protein: 6.6 g/dL (ref 6.1–8.1)

## 2016-10-18 LAB — LIPID PANEL
Cholesterol: 207 mg/dL — ABNORMAL HIGH (ref <200)
HDL: 56 mg/dL (ref >50)
LDL CALC: 132 mg/dL — AB (ref <100)
Total CHOL/HDL Ratio: 3.7 Ratio (ref <5.0)
Triglycerides: 97 mg/dL (ref <150)
VLDL: 19 mg/dL (ref <30)

## 2016-10-20 DIAGNOSIS — Z01419 Encounter for gynecological examination (general) (routine) without abnormal findings: Secondary | ICD-10-CM | POA: Diagnosis not present

## 2016-10-20 DIAGNOSIS — Z6826 Body mass index (BMI) 26.0-26.9, adult: Secondary | ICD-10-CM | POA: Diagnosis not present

## 2016-10-22 ENCOUNTER — Ambulatory Visit (HOSPITAL_COMMUNITY)
Admission: RE | Admit: 2016-10-22 | Discharge: 2016-10-22 | Disposition: A | Discharge status: Home / Self Care | Payer: Medicare Other | Source: Ambulatory Visit | Attending: Family Medicine | Admitting: Family Medicine

## 2016-10-22 DIAGNOSIS — M2550 Pain in unspecified joint: Secondary | ICD-10-CM | POA: Diagnosis not present

## 2016-10-22 DIAGNOSIS — M8588 Other specified disorders of bone density and structure, other site: Secondary | ICD-10-CM | POA: Insufficient documentation

## 2016-10-22 DIAGNOSIS — M1612 Unilateral primary osteoarthritis, left hip: Secondary | ICD-10-CM | POA: Diagnosis not present

## 2016-10-22 DIAGNOSIS — M5136 Other intervertebral disc degeneration, lumbar region: Secondary | ICD-10-CM | POA: Diagnosis not present

## 2016-10-23 ENCOUNTER — Encounter: Payer: Self-pay | Admitting: Family Medicine

## 2016-10-24 ENCOUNTER — Encounter: Payer: Self-pay | Admitting: Family Medicine

## 2016-10-27 ENCOUNTER — Encounter: Payer: Self-pay | Admitting: Family Medicine

## 2016-11-15 ENCOUNTER — Other Ambulatory Visit: Payer: Self-pay | Admitting: Family Medicine

## 2016-11-17 NOTE — Telephone Encounter (Signed)
ok 

## 2016-11-17 NOTE — Telephone Encounter (Signed)
Ok to refill 

## 2016-11-21 ENCOUNTER — Encounter: Payer: Self-pay | Admitting: Family Medicine

## 2016-11-24 DIAGNOSIS — H179 Unspecified corneal scar and opacity: Secondary | ICD-10-CM | POA: Diagnosis not present

## 2016-11-24 DIAGNOSIS — H1851 Endothelial corneal dystrophy: Secondary | ICD-10-CM | POA: Diagnosis not present

## 2016-11-24 DIAGNOSIS — H04123 Dry eye syndrome of bilateral lacrimal glands: Secondary | ICD-10-CM | POA: Diagnosis not present

## 2016-11-24 MED ORDER — PRAVASTATIN SODIUM 40 MG PO TABS: ORAL_TABLET | ORAL | 1 refills | Status: DC

## 2016-12-11 ENCOUNTER — Ambulatory Visit (INDEPENDENT_AMBULATORY_CARE_PROVIDER_SITE_OTHER): Payer: Medicare Other | Admitting: Otolaryngology

## 2016-12-11 DIAGNOSIS — H7201 Central perforation of tympanic membrane, right ear: Secondary | ICD-10-CM | POA: Diagnosis not present

## 2016-12-11 DIAGNOSIS — H6983 Other specified disorders of Eustachian tube, bilateral: Secondary | ICD-10-CM | POA: Diagnosis not present

## 2016-12-22 ENCOUNTER — Other Ambulatory Visit: Payer: Self-pay | Admitting: Family Medicine

## 2016-12-22 DIAGNOSIS — K219 Gastro-esophageal reflux disease without esophagitis: Secondary | ICD-10-CM

## 2016-12-24 DIAGNOSIS — L659 Nonscarring hair loss, unspecified: Secondary | ICD-10-CM | POA: Diagnosis not present

## 2016-12-24 DIAGNOSIS — I8310 Varicose veins of unspecified lower extremity with inflammation: Secondary | ICD-10-CM | POA: Diagnosis not present

## 2016-12-24 DIAGNOSIS — L570 Actinic keratosis: Secondary | ICD-10-CM | POA: Diagnosis not present

## 2017-01-02 ENCOUNTER — Encounter: Payer: Self-pay | Admitting: Family Medicine

## 2017-01-02 ENCOUNTER — Ambulatory Visit (INDEPENDENT_AMBULATORY_CARE_PROVIDER_SITE_OTHER): Payer: Medicare Other | Admitting: Family Medicine

## 2017-01-02 VITALS — BP 134/72 | HR 68 | Temp 98.3°F | Resp 16 | Ht 65.0 in | Wt 155.0 lb

## 2017-01-02 DIAGNOSIS — B9689 Other specified bacterial agents as the cause of diseases classified elsewhere: Secondary | ICD-10-CM | POA: Diagnosis not present

## 2017-01-02 DIAGNOSIS — J019 Acute sinusitis, unspecified: Secondary | ICD-10-CM

## 2017-01-02 MED ORDER — AMOXICILLIN 875 MG PO TABS: 875.0000 mg | ORAL_TABLET | Freq: Two times a day (BID) | ORAL | 0 refills | Status: DC

## 2017-01-02 MED ORDER — PREDNISONE 20 MG PO TABS: ORAL_TABLET | ORAL | 0 refills | Status: DC

## 2017-01-02 NOTE — Progress Notes (Signed)
Subjective:    Patient ID: Allison Freeman, female    DOB: 1946-03-12, 71 y.o.   MRN: [id number]  HPI SymptomsStarted 2 weeks ago as allergies with runny nose, sneezing, itchy watery eyes, postnasal drip. Patient started taking Xyzal as well as Flonase at that time. However after 10 days she is now developing frontal sinus headaches, worsening postnasal drip, pain in her maxillary sinuses, nonproductive cough, sore throat secondary to postnasal drip, and subjective fevers. Past Medical History:  Diagnosis Date  . Acid reflux   . Arthritis   . Depression   . Hypercholesterolemia   . Hypertension   . Insomnia   . Osteopenia   . Panic attacks   . Reflux   . Restless leg syndrome    Past Surgical History:  Procedure Laterality Date  . ABDOMINAL HYSTERECTOMY  2001  . BLADDER SURGERY    . BREAST BIOPSY  1987  . BREAST CYST EXCISION  1998  . CARPAL TUNNEL RELEASE Right 04/06/2015   Procedure: CARPAL TUNNEL RELEASE;  Surgeon: Carole Civil, MD;  Location: [facility];  Service: Orthopedics;  Laterality: Right;  . COLONOSCOPY  09/19/2004   CLE:[id number] hemorrhoids, otherwise normal rectum/Sigmoid diverticula.  Remainder of colon mucosa appeared normal  . COLONOSCOPY WITH ESOPHAGOGASTRODUODENOSCOPY (EGD) N/A 06/24/2013   CBS:[id number] polyp-removed. Small hiatal hernia. Abnormal gastric mucosa-query NSAID effect-s/p (chronic inactive gastritis, negative H. pylori)/TCS: Prominent internal hemorrhoids-likely source of hematochezia. Colonic diverticulosis. Colonic polyp-removed (tubular adenoma)  . CYSTOCELE REPAIR  2010  . DILATION AND CURETTAGE OF UTERUS    . KNEE SURGERY Right 2003  . RECTAL SURGERY    . RECTOCELE REPAIR  2010  . TONSILLECTOMY     Current Outpatient Prescriptions on File Prior to Visit  Medication Sig Dispense Refill  . alendronate (FOSAMAX) 70 MG tablet Take 70 mg by mouth once a week. Reported on 03/19/2016    . ALPRAZolam (XANAX) 0.5 MG tablet TAKE ONE TABLET BY  MOUTH AT BEDTIME AS NEEDED FOR ANXIETY. 30 tablet 2  . aspirin EC 81 MG tablet Take 81 mg by mouth daily.    Marland Kitchen ezetimibe (ZETIA) 10 MG tablet Take 1 tablet (10 mg total) by mouth daily. 90 tablet 3  . fluticasone (FLONASE) 50 MCG/ACT nasal spray Use 2 sprays in each  nostril once daily 48 g 3  . hydroxypropyl methylcellulose / hypromellose (ISOPTO TEARS / GONIOVISC) 2.5 % ophthalmic solution Place 1 drop into both eyes 3 (three) times daily.    . Magnesium 250 MG TABS Take by mouth.    . Multiple Vitamin (MULTIVITAMIN) tablet Take 1 tablet by mouth daily.    Marland Kitchen MYRBETRIQ 50 MG TB24 tablet Take 50 mg by mouth daily.     . Omega-3 Fatty Acids (FISH OIL) 1200 MG CAPS Take 1 capsule by mouth daily.    . pantoprazole (PROTONIX) 40 MG tablet Take 1 tablet by mouth  twice a day before meals 180 tablet 2  . pravastatin (PRAVACHOL) 40 MG tablet Take 1 tablet by mouth  daily 90 tablet 1  . ranitidine (ZANTAC) 300 MG tablet TAKE ONE TABLET DAILY AT BEDTIME 30 tablet 11  . rOPINIRole (REQUIP) 1 MG tablet Take 1 tablet by mouth  daily 90 tablet 3  . traMADol (ULTRAM) 50 MG tablet Take 1 tablet 3 times a day 270 tablet 0  . TURMERIC PO Take by mouth.     No current facility-administered medications on file prior to visit.    Allergies  Allergen Reactions  .  Other     Arthritis medications- causes a bacteria in the abdomen   Social History   Social History  . Marital status: Married    Spouse name: N/A  . Number of children: N/A  . Years of education: N/A   Occupational History  . Not on file.   Social History Main Topics  . Smoking status: Never Smoker  . Smokeless tobacco: Never Used  . Alcohol use No  . Drug use: No  . Sexual activity: Yes    Birth control/ protection: Surgical   Other Topics Concern  . Not on file   Social History Narrative  . No narrative on file      Review of Systems  All other systems reviewed and are negative.      Objective:   Physical Exam    Constitutional: She appears well-developed and well-nourished. No distress.  HENT:  Right Ear: External ear normal.  Left Ear: External ear normal.  Nose: Mucosal edema and rhinorrhea present. Right sinus exhibits maxillary sinus tenderness and frontal sinus tenderness. Left sinus exhibits maxillary sinus tenderness and frontal sinus tenderness.  Mouth/Throat: Oropharynx is clear and moist. No oropharyngeal exudate.  Eyes: Conjunctivae are normal.  Neck: Neck supple.  Cardiovascular: Normal rate, regular rhythm and normal heart sounds.   Pulmonary/Chest: Effort normal and breath sounds normal. No respiratory distress. She has no wheezes. She has no rales.  Lymphadenopathy:    She has no cervical adenopathy.  Skin: She is not diaphoretic.  Vitals reviewed.         Assessment & Plan:  Acute bacterial rhinosinusitis - Plan: amoxicillin (AMOXIL) 875 MG tablet, predniSONE (DELTASONE) 20 MG tablet  Patient certainly has allergies but a blue she's developed a secondary bacterial sinusitis. Begin prednisone taper pack. Start amoxicillin 875 mg one by mouth twice a day for 10 days. Recheck next week if no better or sooner if worse

## 2017-01-14 ENCOUNTER — Other Ambulatory Visit: Payer: Self-pay | Admitting: Family Medicine

## 2017-01-14 MED ORDER — PANTOPRAZOLE SODIUM 40 MG PO TBEC: DELAYED_RELEASE_TABLET | ORAL | 2 refills | Status: DC

## 2017-02-03 ENCOUNTER — Encounter: Payer: Self-pay | Admitting: Family Medicine

## 2017-02-03 ENCOUNTER — Other Ambulatory Visit: Payer: Self-pay | Admitting: Family Medicine

## 2017-02-03 ENCOUNTER — Ambulatory Visit (INDEPENDENT_AMBULATORY_CARE_PROVIDER_SITE_OTHER): Payer: Medicare Other | Admitting: Family Medicine

## 2017-02-03 VITALS — BP 150/78 | HR 68 | Temp 98.1°F | Resp 16 | Ht 65.0 in | Wt 156.0 lb

## 2017-02-03 DIAGNOSIS — D649 Anemia, unspecified: Secondary | ICD-10-CM | POA: Diagnosis not present

## 2017-02-03 DIAGNOSIS — K219 Gastro-esophageal reflux disease without esophagitis: Secondary | ICD-10-CM

## 2017-02-03 LAB — CBC WITH DIFFERENTIAL/PLATELET
Basophils Absolute: 63 cells/uL (ref 0–200)
Basophils Relative: 1 %
EOS ABS: 126 {cells}/uL (ref 15–500)
EOS PCT: 2 %
HCT: 33.8 % — ABNORMAL LOW (ref 35.0–45.0)
Hemoglobin: 10.6 g/dL — ABNORMAL LOW (ref 12.0–15.0)
Lymphocytes Relative: 37 %
Lymphs Abs: 2331 cells/uL (ref 850–3900)
MCH: 26.5 pg — ABNORMAL LOW (ref 27.0–33.0)
MCHC: 31.4 g/dL — ABNORMAL LOW (ref 32.0–36.0)
MCV: 84.5 fL (ref 80.0–100.0)
MONOS PCT: 8 %
MPV: 9.8 fL (ref 7.5–12.5)
Monocytes Absolute: 504 cells/uL (ref 200–950)
NEUTROS ABS: 3276 {cells}/uL (ref 1500–7800)
Neutrophils Relative %: 52 %
Platelets: 259 10*3/uL (ref 140–400)
RBC: 4 MIL/uL (ref 3.80–5.10)
RDW: 14.2 % (ref 11.0–15.0)
WBC: 6.3 10*3/uL (ref 3.8–10.8)

## 2017-02-03 LAB — COMPLETE METABOLIC PANEL WITH GFR
ALBUMIN: 3.8 g/dL (ref 3.6–5.1)
ALK PHOS: 61 U/L (ref 33–130)
ALT: 16 U/L (ref 6–29)
AST: 20 U/L (ref 10–35)
BILIRUBIN TOTAL: 0.8 mg/dL (ref 0.2–1.2)
BUN: 21 mg/dL (ref 7–25)
CALCIUM: 9 mg/dL (ref 8.6–10.4)
CHLORIDE: 106 mmol/L (ref 98–110)
CO2: 24 mmol/L (ref 20–31)
CREATININE: 0.99 mg/dL (ref 0.50–0.99)
GFR, EST AFRICAN AMERICAN: 68 mL/min (ref >=60)
GFR, Est Non African American: 59 mL/min — ABNORMAL LOW (ref >=60)
Glucose, Bld: 92 mg/dL (ref 70–99)
Potassium: 4 mmol/L (ref 3.5–5.3)
Sodium: 140 mmol/L (ref 135–146)
TOTAL PROTEIN: 6.6 g/dL (ref 6.1–8.1)

## 2017-02-03 LAB — LIPASE: LIPASE: 29 U/L (ref 7–60)

## 2017-02-03 MED ORDER — SUCRALFATE 1 G PO TABS: 1.0000 g | ORAL_TABLET | Freq: Three times a day (TID) | ORAL | 0 refills | Status: DC

## 2017-02-03 NOTE — Progress Notes (Signed)
Subjective:    Patient ID: Allison Freeman, female    DOB: 1946-09-15, 71 y.o.   MRN: [id number]  HPI Patient had an EGD performed approximately 5 years ago that was negative for any abnormality. She states that over the last several months, she's developed worsening and refractory GERD. She is currently on Zantac 300 mg by mouth daily at bedtime as well as pantoprazole 40 mg by mouth twice a day. Yet she still reports indigestion in the center of her chest, wet burps, a sour taste in the back of her mouth whenever she lies down, heartburn. She denies any fevers or chills. She denies any chest pain or shortness of breath or angina. She denies any melena or hematochezia. She is no longer taking Fosamax. She is not taking any over-the-counter NSAIDs. Past Medical History:  Diagnosis Date  . Acid reflux   . Arthritis   . Depression   . Hypercholesterolemia   . Hypertension   . Insomnia   . Osteopenia   . Panic attacks   . Reflux   . Restless leg syndrome    Past Surgical History:  Procedure Laterality Date  . ABDOMINAL HYSTERECTOMY  2001  . BLADDER SURGERY    . BREAST BIOPSY  1987  . BREAST CYST EXCISION  1998  . CARPAL TUNNEL RELEASE Right 04/06/2015   Procedure: CARPAL TUNNEL RELEASE;  Surgeon: Carole Civil, MD;  Location: [facility];  Service: Orthopedics;  Laterality: Right;  . COLONOSCOPY  09/19/2004   SAY:[id number] hemorrhoids, otherwise normal rectum/Sigmoid diverticula.  Remainder of colon mucosa appeared normal  . COLONOSCOPY WITH ESOPHAGOGASTRODUODENOSCOPY (EGD) N/A 06/24/2013   NAT:[id number] polyp-removed. Small hiatal hernia. Abnormal gastric mucosa-query NSAID effect-s/p (chronic inactive gastritis, negative H. pylori)/TCS: Prominent internal hemorrhoids-likely source of hematochezia. Colonic diverticulosis. Colonic polyp-removed (tubular adenoma)  . CYSTOCELE REPAIR  2010  . DILATION AND CURETTAGE OF UTERUS    . KNEE SURGERY Right 2003  . RECTAL SURGERY    . RECTOCELE  REPAIR  2010  . TONSILLECTOMY     Current Outpatient Prescriptions on File Prior to Visit  Medication Sig Dispense Refill  . ALPRAZolam (XANAX) 0.5 MG tablet TAKE ONE TABLET BY MOUTH AT BEDTIME AS NEEDED FOR ANXIETY. 30 tablet 2  . aspirin EC 81 MG tablet Take 81 mg by mouth daily.    Marland Kitchen ezetimibe (ZETIA) 10 MG tablet Take 1 tablet (10 mg total) by mouth daily. 90 tablet 3  . fluticasone (FLONASE) 50 MCG/ACT nasal spray Use 2 sprays in each  nostril once daily 48 g 3  . hydroxypropyl methylcellulose / hypromellose (ISOPTO TEARS / GONIOVISC) 2.5 % ophthalmic solution Place 1 drop into both eyes 3 (three) times daily.    Marland Kitchen levocetirizine (XYZAL) 5 MG tablet Take 5 mg by mouth every evening.    . Magnesium 250 MG TABS Take by mouth.    . Multiple Vitamin (MULTIVITAMIN) tablet Take 1 tablet by mouth daily.    Marland Kitchen MYRBETRIQ 50 MG TB24 tablet Take 50 mg by mouth daily.     . Omega-3 Fatty Acids (FISH OIL) 1200 MG CAPS Take 1 capsule by mouth daily.    . pantoprazole (PROTONIX) 40 MG tablet Take 1 tablet by mouth  twice a day before meals 180 tablet 2  . pravastatin (PRAVACHOL) 40 MG tablet Take 1 tablet by mouth  daily 90 tablet 1  . ranitidine (ZANTAC) 300 MG tablet TAKE ONE TABLET DAILY AT BEDTIME 30 tablet 11  . rOPINIRole (REQUIP) 1 MG  tablet Take 1 tablet by mouth  daily 90 tablet 3  . traMADol (ULTRAM) 50 MG tablet Take 1 tablet 3 times a day 270 tablet 0  . TURMERIC PO Take by mouth.    Marland Kitchen alendronate (FOSAMAX) 70 MG tablet Take 70 mg by mouth once a week. Reported on 03/19/2016     No current facility-administered medications on file prior to visit.    Allergies  Allergen Reactions  . Other     Arthritis medications- causes a bacteria in the abdomen   Social History   Social History  . Marital status: Married    Spouse name: N/A  . Number of children: N/A  . Years of education: N/A   Occupational History  . Not on file.   Social History Main Topics  . Smoking status: Never  Smoker  . Smokeless tobacco: Never Used  . Alcohol use No  . Drug use: No  . Sexual activity: Yes    Birth control/ protection: Surgical   Other Topics Concern  . Not on file   Social History Narrative  . No narrative on file      Review of Systems  All other systems reviewed and are negative.      Objective:   Physical Exam  Constitutional: She appears well-developed and well-nourished.  Cardiovascular: Normal rate, regular rhythm and normal heart sounds.   No murmur heard. Pulmonary/Chest: Effort normal and breath sounds normal. No respiratory distress. She has no wheezes. She has no rales.  Abdominal: Soft. Bowel sounds are normal. She exhibits no distension. There is no tenderness. There is no rebound and no guarding.  Vitals reviewed.         Assessment & Plan:  Gastroesophageal reflux disease, esophagitis presence not specified - Plan: H. pylori breath test, CBC with Differential/Platelet, COMPLETE METABOLIC PANEL WITH GFR, Lipase, sucralfate (CARAFATE) 1 g tablet  Add Carafate 1 g by mouth every before meals at bedtime. Meanwhile check H pylori breath test, CBC, CMP, and lipase. Depending on laboratory results and the patient's response to Carafate over the next 2 weeks, would consider a referral back to GI for a possible repeat EGD.

## 2017-02-04 LAB — H. PYLORI BREATH TEST: H. PYLORI BREATH TEST: NOT DETECTED

## 2017-02-05 ENCOUNTER — Encounter: Payer: Self-pay | Admitting: Family Medicine

## 2017-02-06 ENCOUNTER — Other Ambulatory Visit: Payer: Self-pay | Admitting: Family Medicine

## 2017-02-06 ENCOUNTER — Encounter: Payer: Self-pay | Admitting: Family Medicine

## 2017-02-06 DIAGNOSIS — D509 Iron deficiency anemia, unspecified: Secondary | ICD-10-CM

## 2017-02-06 LAB — IRON: IRON: 39 ug/dL — AB (ref 45–160)

## 2017-02-07 DIAGNOSIS — D508 Other iron deficiency anemias: Secondary | ICD-10-CM | POA: Diagnosis not present

## 2017-02-08 DIAGNOSIS — D508 Other iron deficiency anemias: Secondary | ICD-10-CM | POA: Diagnosis not present

## 2017-02-09 ENCOUNTER — Other Ambulatory Visit: Payer: Medicare Other

## 2017-02-09 DIAGNOSIS — D508 Other iron deficiency anemias: Secondary | ICD-10-CM | POA: Diagnosis not present

## 2017-02-10 ENCOUNTER — Other Ambulatory Visit: Payer: Self-pay | Admitting: Family Medicine

## 2017-02-10 DIAGNOSIS — D508 Other iron deficiency anemias: Secondary | ICD-10-CM

## 2017-02-11 LAB — FECAL OCCULT BLOOD, IMMUNOCHEMICAL
FECAL OCCULT BLOOD: NEGATIVE
FECAL OCCULT BLOOD: NEGATIVE
Fecal Occult Blood: NEGATIVE

## 2017-02-12 ENCOUNTER — Other Ambulatory Visit: Payer: Self-pay | Admitting: Family Medicine

## 2017-02-12 ENCOUNTER — Encounter: Payer: Self-pay | Admitting: Family Medicine

## 2017-02-12 NOTE — Telephone Encounter (Signed)
Ok to refill 

## 2017-02-13 NOTE — Telephone Encounter (Signed)
ok 

## 2017-02-13 NOTE — Telephone Encounter (Signed)
Medication called to pharmacy. 

## 2017-03-04 DIAGNOSIS — M79604 Pain in right leg: Secondary | ICD-10-CM | POA: Diagnosis not present

## 2017-03-04 DIAGNOSIS — M79605 Pain in left leg: Secondary | ICD-10-CM | POA: Diagnosis not present

## 2017-03-05 ENCOUNTER — Other Ambulatory Visit: Payer: Medicare Other

## 2017-03-05 ENCOUNTER — Other Ambulatory Visit: Payer: Self-pay | Admitting: Family Medicine

## 2017-03-05 DIAGNOSIS — Z13220 Encounter for screening for lipoid disorders: Secondary | ICD-10-CM | POA: Diagnosis not present

## 2017-03-05 DIAGNOSIS — D509 Iron deficiency anemia, unspecified: Secondary | ICD-10-CM | POA: Diagnosis not present

## 2017-03-05 DIAGNOSIS — E785 Hyperlipidemia, unspecified: Secondary | ICD-10-CM | POA: Diagnosis not present

## 2017-03-05 DIAGNOSIS — Z1231 Encounter for screening mammogram for malignant neoplasm of breast: Secondary | ICD-10-CM

## 2017-03-05 LAB — CBC WITH DIFFERENTIAL/PLATELET
Basophils Absolute: 43 cells/uL (ref 0–200)
Basophils Relative: 1 %
EOS ABS: 172 {cells}/uL (ref 15–500)
EOS PCT: 4 %
HCT: 38.2 % (ref 35.0–45.0)
HEMOGLOBIN: 11.8 g/dL — AB (ref 12.0–15.0)
Lymphocytes Relative: 34 %
Lymphs Abs: 1462 cells/uL (ref 850–3900)
MCH: 26.8 pg — AB (ref 27.0–33.0)
MCHC: 30.9 g/dL — ABNORMAL LOW (ref 32.0–36.0)
MCV: 86.6 fL (ref 80.0–100.0)
MONOS PCT: 9 %
MPV: 10.1 fL (ref 7.5–12.5)
Monocytes Absolute: 387 cells/uL (ref 200–950)
NEUTROS ABS: 2236 {cells}/uL (ref 1500–7800)
Neutrophils Relative %: 52 %
Platelets: 252 10*3/uL (ref 140–400)
RBC: 4.41 MIL/uL (ref 3.80–5.10)
RDW: 15.9 % — AB (ref 11.0–15.0)
WBC: 4.3 10*3/uL (ref 3.8–10.8)

## 2017-03-05 LAB — LIPID PANEL
CHOLESTEROL: 153 mg/dL (ref <200)
HDL: 55 mg/dL (ref >50)
LDL Cholesterol: 79 mg/dL (ref <100)
Total CHOL/HDL Ratio: 2.8 Ratio (ref <5.0)
Triglycerides: 97 mg/dL (ref <150)
VLDL: 19 mg/dL (ref <30)

## 2017-03-06 ENCOUNTER — Encounter: Payer: Self-pay | Admitting: Family Medicine

## 2017-03-17 ENCOUNTER — Other Ambulatory Visit: Payer: Self-pay | Admitting: Family Medicine

## 2017-03-18 NOTE — Telephone Encounter (Signed)
okay

## 2017-03-18 NOTE — Telephone Encounter (Signed)
Ok to refill??      LOV - 02/03/17 LRF 02/13/17

## 2017-03-24 DIAGNOSIS — I8311 Varicose veins of right lower extremity with inflammation: Secondary | ICD-10-CM | POA: Diagnosis not present

## 2017-03-24 DIAGNOSIS — I8312 Varicose veins of left lower extremity with inflammation: Secondary | ICD-10-CM | POA: Diagnosis not present

## 2017-03-27 ENCOUNTER — Ambulatory Visit (INDEPENDENT_AMBULATORY_CARE_PROVIDER_SITE_OTHER): Payer: Medicare Other | Admitting: Urology

## 2017-03-27 ENCOUNTER — Other Ambulatory Visit (HOSPITAL_COMMUNITY)
Admission: RE | Admit: 2017-03-27 | Discharge: 2017-03-27 | Disposition: A | Discharge status: Home / Self Care | Payer: Medicare Other | Source: Ambulatory Visit | Attending: Urology | Admitting: Urology

## 2017-03-27 DIAGNOSIS — N3941 Urge incontinence: Secondary | ICD-10-CM | POA: Diagnosis not present

## 2017-03-27 DIAGNOSIS — N390 Urinary tract infection, site not specified: Secondary | ICD-10-CM | POA: Insufficient documentation

## 2017-03-27 LAB — URINALYSIS, ROUTINE W REFLEX MICROSCOPIC
Bacteria, UA: NONE SEEN
Bilirubin Urine: NEGATIVE
Glucose, UA: NEGATIVE mg/dL
Hgb urine dipstick: NEGATIVE
Ketones, ur: NEGATIVE mg/dL
Nitrite: NEGATIVE
Protein, ur: NEGATIVE mg/dL
Specific Gravity, Urine: 1.019 (ref 1.005–1.030)
pH: 6 (ref 5.0–8.0)

## 2017-03-28 LAB — URINE CULTURE: Culture: <10000 — AB

## 2017-04-13 ENCOUNTER — Other Ambulatory Visit: Payer: Self-pay

## 2017-04-13 ENCOUNTER — Encounter: Payer: Self-pay | Admitting: Family Medicine

## 2017-04-14 ENCOUNTER — Other Ambulatory Visit: Payer: Self-pay | Admitting: Family Medicine

## 2017-04-14 DIAGNOSIS — R799 Abnormal finding of blood chemistry, unspecified: Secondary | ICD-10-CM

## 2017-04-14 DIAGNOSIS — E611 Iron deficiency: Secondary | ICD-10-CM

## 2017-04-14 NOTE — Telephone Encounter (Signed)
Ok to refill 

## 2017-04-14 NOTE — Telephone Encounter (Signed)
ok 

## 2017-04-22 ENCOUNTER — Ambulatory Visit (HOSPITAL_COMMUNITY)
Admission: RE | Admit: 2017-04-22 | Discharge: 2017-04-22 | Disposition: A | Discharge status: Home / Self Care | Payer: Medicare Other | Source: Ambulatory Visit | Attending: Family Medicine | Admitting: Family Medicine

## 2017-04-22 ENCOUNTER — Other Ambulatory Visit: Payer: Medicare Other

## 2017-04-22 DIAGNOSIS — E611 Iron deficiency: Secondary | ICD-10-CM | POA: Diagnosis not present

## 2017-04-22 DIAGNOSIS — Z1231 Encounter for screening mammogram for malignant neoplasm of breast: Secondary | ICD-10-CM | POA: Diagnosis not present

## 2017-04-22 DIAGNOSIS — R799 Abnormal finding of blood chemistry, unspecified: Secondary | ICD-10-CM

## 2017-04-22 LAB — CBC WITH DIFFERENTIAL/PLATELET
BASOS ABS: 52 {cells}/uL (ref 0–200)
Basophils Relative: 1 %
EOS PCT: 2 %
Eosinophils Absolute: 104 cells/uL (ref 15–500)
HCT: 39.7 % (ref 35.0–45.0)
HEMOGLOBIN: 12.8 g/dL (ref 12.0–15.0)
LYMPHS ABS: 1456 {cells}/uL (ref 850–3900)
Lymphocytes Relative: 28 %
MCH: 28.5 pg (ref 27.0–33.0)
MCHC: 32.2 g/dL (ref 32.0–36.0)
MCV: 88.4 fL (ref 80.0–100.0)
MPV: 9.8 fL (ref 7.5–12.5)
Monocytes Absolute: 520 cells/uL (ref 200–950)
Monocytes Relative: 10 %
NEUTROS ABS: 3068 {cells}/uL (ref 1500–7800)
Neutrophils Relative %: 59 %
Platelets: 244 10*3/uL (ref 140–400)
RBC: 4.49 MIL/uL (ref 3.80–5.10)
RDW: 15.6 % — ABNORMAL HIGH (ref 11.0–15.0)
WBC: 5.2 10*3/uL (ref 3.8–10.8)

## 2017-04-23 ENCOUNTER — Encounter: Payer: Self-pay | Admitting: Family Medicine

## 2017-04-29 DIAGNOSIS — I8312 Varicose veins of left lower extremity with inflammation: Secondary | ICD-10-CM | POA: Diagnosis not present

## 2017-04-29 DIAGNOSIS — I8311 Varicose veins of right lower extremity with inflammation: Secondary | ICD-10-CM | POA: Diagnosis not present

## 2017-05-13 DIAGNOSIS — I8311 Varicose veins of right lower extremity with inflammation: Secondary | ICD-10-CM | POA: Diagnosis not present

## 2017-05-15 DIAGNOSIS — I8311 Varicose veins of right lower extremity with inflammation: Secondary | ICD-10-CM | POA: Diagnosis not present

## 2017-05-18 DIAGNOSIS — I8311 Varicose veins of right lower extremity with inflammation: Secondary | ICD-10-CM | POA: Diagnosis not present

## 2017-05-20 ENCOUNTER — Encounter: Payer: Self-pay | Admitting: Family Medicine

## 2017-05-21 ENCOUNTER — Other Ambulatory Visit: Payer: Self-pay | Admitting: Family Medicine

## 2017-05-21 DIAGNOSIS — K219 Gastro-esophageal reflux disease without esophagitis: Secondary | ICD-10-CM

## 2017-05-21 MED ORDER — RANITIDINE HCL 300 MG PO TABS
300.0000 mg | ORAL_TABLET | Freq: Every day | ORAL | 3 refills | Status: DC
Start: 2017-05-21 — End: 2018-07-01

## 2017-05-27 DIAGNOSIS — I8311 Varicose veins of right lower extremity with inflammation: Secondary | ICD-10-CM | POA: Diagnosis not present

## 2017-06-09 ENCOUNTER — Other Ambulatory Visit: Payer: Self-pay | Admitting: Family Medicine

## 2017-06-10 DIAGNOSIS — I8311 Varicose veins of right lower extremity with inflammation: Secondary | ICD-10-CM | POA: Diagnosis not present

## 2017-06-26 ENCOUNTER — Ambulatory Visit (INDEPENDENT_AMBULATORY_CARE_PROVIDER_SITE_OTHER): Payer: Medicare Other | Admitting: Urology

## 2017-06-26 DIAGNOSIS — Z87440 Personal history of urinary (tract) infections: Secondary | ICD-10-CM | POA: Diagnosis not present

## 2017-06-26 DIAGNOSIS — N3941 Urge incontinence: Secondary | ICD-10-CM | POA: Diagnosis not present

## 2017-06-30 ENCOUNTER — Other Ambulatory Visit: Payer: Self-pay | Admitting: Family Medicine

## 2017-07-03 ENCOUNTER — Ambulatory Visit: Payer: 59

## 2017-07-03 DIAGNOSIS — I8311 Varicose veins of right lower extremity with inflammation: Secondary | ICD-10-CM | POA: Diagnosis not present

## 2017-07-13 ENCOUNTER — Other Ambulatory Visit: Payer: Self-pay | Admitting: Family Medicine

## 2017-07-13 NOTE — Telephone Encounter (Signed)
Ok to refill 

## 2017-07-13 NOTE — Telephone Encounter (Signed)
ok 

## 2017-07-14 DIAGNOSIS — Z23 Encounter for immunization: Secondary | ICD-10-CM | POA: Diagnosis not present

## 2017-07-22 ENCOUNTER — Other Ambulatory Visit: Payer: Self-pay | Admitting: Family Medicine

## 2017-07-22 NOTE — Telephone Encounter (Signed)
Ok to refill 

## 2017-07-23 NOTE — Telephone Encounter (Signed)
ok 

## 2017-07-24 DIAGNOSIS — I8311 Varicose veins of right lower extremity with inflammation: Secondary | ICD-10-CM | POA: Diagnosis not present

## 2017-07-24 DIAGNOSIS — I83812 Varicose veins of left lower extremities with pain: Secondary | ICD-10-CM | POA: Diagnosis not present

## 2017-07-24 DIAGNOSIS — I83892 Varicose veins of left lower extremities with other complications: Secondary | ICD-10-CM | POA: Diagnosis not present

## 2017-08-07 DIAGNOSIS — I8312 Varicose veins of left lower extremity with inflammation: Secondary | ICD-10-CM | POA: Diagnosis not present

## 2017-08-24 ENCOUNTER — Other Ambulatory Visit: Payer: Self-pay | Admitting: Family Medicine

## 2017-08-24 NOTE — Telephone Encounter (Signed)
ok 

## 2017-08-24 NOTE — Telephone Encounter (Signed)
Ok to refill 

## 2017-08-25 DIAGNOSIS — I8312 Varicose veins of left lower extremity with inflammation: Secondary | ICD-10-CM | POA: Diagnosis not present

## 2017-08-31 ENCOUNTER — Encounter: Payer: Medicare Other | Admitting: Family Medicine

## 2017-09-03 ENCOUNTER — Encounter: Payer: Self-pay | Admitting: Family Medicine

## 2017-09-03 ENCOUNTER — Ambulatory Visit (INDEPENDENT_AMBULATORY_CARE_PROVIDER_SITE_OTHER): Payer: Medicare Other | Admitting: Family Medicine

## 2017-09-03 VITALS — BP 170/100 | HR 96 | Temp 98.1°F | Resp 14 | Ht 65.0 in | Wt 166.0 lb

## 2017-09-03 DIAGNOSIS — I10 Essential (primary) hypertension: Secondary | ICD-10-CM | POA: Diagnosis not present

## 2017-09-03 DIAGNOSIS — Z0000 Encounter for general adult medical examination without abnormal findings: Secondary | ICD-10-CM | POA: Diagnosis not present

## 2017-09-03 DIAGNOSIS — E7800 Pure hypercholesterolemia, unspecified: Secondary | ICD-10-CM | POA: Diagnosis not present

## 2017-09-03 DIAGNOSIS — Z1159 Encounter for screening for other viral diseases: Secondary | ICD-10-CM

## 2017-09-03 MED ORDER — LOSARTAN POTASSIUM 100 MG PO TABS: 100.0000 mg | ORAL_TABLET | Freq: Every day | ORAL | 3 refills | Status: DC

## 2017-09-03 NOTE — Progress Notes (Signed)
Subjective:    Patient ID: Allison Freeman, female    DOB: July 30, 1946, 71 y.o.   MRN: [id number]  HPI  Patient is here today for a complete physical exam.  She denies any concerns about falls or depression.  She denies any concerns about dementia.  Her only concern is a cough with chest congestion and a sore scratchy throat that she has had for 5 days.  Her exam today is unremarkable and I suspect that this is a viral upper respiratory infection.  However her blood pressure is extremely high today.  I rechecked this myself and verified it to be correct.  She has had some isolated readings that have been elevated and I believe she is showing signs of hypertension.  Her shot record is included below. Immunization History  Administered Date(s) Administered  . Influenza,inj,Quad PF,6+ Mos 06/23/2014, 06/27/2015, 06/27/2016  . Influenza-Unspecified 06/30/2013, 07/14/2017  . Pneumococcal Conjugate-13 06/23/2014  . Pneumococcal Polysaccharide-23 01/28/2016   No visits with results within 1 Month(s) from this visit.  Latest known visit with results is:  Appointment on 04/22/2017  Component Date Value Ref Range Status  . WBC 04/22/2017 5.2  3.8 - 10.8 K/uL Final  . RBC 04/22/2017 4.49  3.80 - 5.10 MIL/uL Final  . Hemoglobin 04/22/2017 12.8  12.0 - 15.0 g/dL Final  . HCT 04/22/2017 39.7  35.0 - 45.0 % Final  . MCV 04/22/2017 88.4  80.0 - 100.0 fL Final  . MCH 04/22/2017 28.5  27.0 - 33.0 pg Final  . MCHC 04/22/2017 32.2  32.0 - 36.0 g/dL Final  . RDW 04/22/2017 15.6* 11.0 - 15.0 % Final  . Platelets 04/22/2017 244  140 - 400 K/uL Final  . MPV 04/22/2017 9.8  7.5 - 12.5 fL Final  . Neutro Abs 04/22/2017 3,068  1,500 - 7,800 cells/uL Final  . Lymphs Abs 04/22/2017 1,456  850 - 3,900 cells/uL Final  . Monocytes Absolute 04/22/2017 520  200 - 950 cells/uL Final  . Eosinophils Absolute 04/22/2017 104  15 - 500 cells/uL Final  . Basophils Absolute 04/22/2017 52  0 - 200 cells/uL Final  .  Neutrophils Relative % 04/22/2017 59  % Final  . Lymphocytes Relative 04/22/2017 28  % Final  . Monocytes Relative 04/22/2017 10  % Final  . Eosinophils Relative 04/22/2017 2  % Final  . Basophils Relative 04/22/2017 1  % Final  . Smear Review 04/22/2017 Criteria for review not met   Final   Past Medical History:  Diagnosis Date  . Acid reflux   . Arthritis   . Depression   . Hypercholesterolemia   . Hypertension   . Insomnia   . Osteopenia   . Panic attacks   . Reflux   . Restless leg syndrome    Past Surgical History:  Procedure Laterality Date  . ABDOMINAL HYSTERECTOMY  2001  . BLADDER SURGERY    . BREAST BIOPSY  1987  . BREAST CYST EXCISION  1998  . CARPAL TUNNEL RELEASE Right 04/06/2015   Procedure: CARPAL TUNNEL RELEASE;  Surgeon: Carole Civil, MD;  Location: [facility];  Service: Orthopedics;  Laterality: Right;  . COLONOSCOPY  09/19/2004   XEN:[id number] hemorrhoids, otherwise normal rectum/Sigmoid diverticula.  Remainder of colon mucosa appeared normal  . COLONOSCOPY WITH ESOPHAGOGASTRODUODENOSCOPY (EGD) N/A 06/24/2013   PJS:RPRXYVO polyp-removed. Small hiatal hernia. Abnormal gastric mucosa-query NSAID effect-s/p (chronic inactive gastritis, negative H. pylori)/TCS: Prominent internal hemorrhoids-likely source of hematochezia. Colonic diverticulosis. Colonic polyp-removed (tubular adenoma)  . CYSTOCELE  REPAIR  2010  . DILATION AND CURETTAGE OF UTERUS    . KNEE SURGERY Right 2003  . RECTAL SURGERY    . RECTOCELE REPAIR  2010  . TONSILLECTOMY     Current Outpatient Medications on File Prior to Visit  Medication Sig Dispense Refill  . ALPRAZolam (XANAX) 0.5 MG tablet TAKE ONE TABLET BY MOUTH EVERY NIGHT AT BEDTIME 30 tablet 2  . aspirin EC 81 MG tablet Take 81 mg by mouth daily.    . fluticasone (FLONASE) 50 MCG/ACT nasal spray Use 2 sprays in each  nostril once daily 48 g 3  . hydroxypropyl methylcellulose / hypromellose (ISOPTO TEARS / GONIOVISC) 2.5 % ophthalmic  solution Place 1 drop into both eyes 3 (three) times daily.    . Magnesium 250 MG TABS Take by mouth.    . Multiple Vitamin (MULTIVITAMIN) tablet Take 1 tablet by mouth daily.    Marland Kitchen MYRBETRIQ 50 MG TB24 tablet Take 50 mg by mouth daily.     . Omega-3 Fatty Acids (FISH OIL) 1200 MG CAPS Take 1 capsule by mouth daily.    . pantoprazole (PROTONIX) 40 MG tablet Take 1 tablet by mouth  twice a day before meals 180 tablet 2  . pravastatin (PRAVACHOL) 40 MG tablet TAKE ONE (1) TABLET BY MOUTH EVERY DAY 90 tablet 0  . ranitidine (ZANTAC) 300 MG tablet Take 1 tablet (300 mg total) by mouth at bedtime. 90 tablet 3  . rOPINIRole (REQUIP) 1 MG tablet TAKE ONE (1) TABLET EACH DAY 90 tablet 1  . sucralfate (CARAFATE) 1 g tablet Take 1 tablet (1 g total) by mouth 4 (four) times daily -  with meals and at bedtime. 120 tablet 0  . traMADol (ULTRAM) 50 MG tablet TAKE ONE TABLET BY MOUTH THREE TIMES DAILY AS NEEDED. *MUST LAST 90 DAYS* 270 tablet 0  . TURMERIC PO Take by mouth.     No current facility-administered medications on file prior to visit.    Allergies  Allergen Reactions  . Other     Arthritis medications- causes a bacteria in the abdomen   Social History   Socioeconomic History  . Marital status: Married    Spouse name: Not on file  . Number of children: Not on file  . Years of education: Not on file  . Highest education level: Not on file  Social Needs  . Financial resource strain: Not on file  . Food insecurity - worry: Not on file  . Food insecurity - inability: Not on file  . Transportation needs - medical: Not on file  . Transportation needs - non-medical: Not on file  Occupational History  . Not on file  Tobacco Use  . Smoking status: Never Smoker  . Smokeless tobacco: Never Used  Substance and Sexual Activity  . Alcohol use: No  . Drug use: No  . Sexual activity: Yes    Birth control/protection: Surgical  Other Topics Concern  . Not on file  Social History Narrative  .  Not on file   Colonoscopy was in 2014 and is up-to-date.  Mammogram was checked in August and is up-to-date.  Patient has a history of a hysterectomy and therefore does not require a Pap smear.  Review of Systems  All other systems reviewed and are negative.      Objective:   Physical Exam  Constitutional: She is oriented to person, place, and time. She appears well-developed and well-nourished. No distress.  HENT:  Head: Normocephalic and atraumatic.  Right Ear: External ear normal.  Left Ear: External ear normal.  Nose: Nose normal.  Mouth/Throat: Oropharynx is clear and moist. No oropharyngeal exudate.  Eyes: Conjunctivae and EOM are normal. Pupils are equal, round, and reactive to light. Right eye exhibits no discharge. Left eye exhibits no discharge. No scleral icterus.  Neck: Normal range of motion. Neck supple. No JVD present. No tracheal deviation present. No thyromegaly present.  Cardiovascular: Normal rate, regular rhythm, normal heart sounds and intact distal pulses. Exam reveals no gallop and no friction rub.  No murmur heard. Pulmonary/Chest: Effort normal and breath sounds normal. No stridor. No respiratory distress. She has no wheezes. She has no rales. She exhibits no tenderness.  Abdominal: Soft. Bowel sounds are normal. She exhibits no distension and no mass. There is no tenderness. There is no rebound and no guarding.  Musculoskeletal: Normal range of motion. She exhibits no edema, tenderness or deformity.  Lymphadenopathy:    She has no cervical adenopathy.  Neurological: She is alert and oriented to person, place, and time. She has normal reflexes. She displays normal reflexes. No cranial nerve deficit. She exhibits normal muscle tone. Coordination normal.  Skin: Skin is warm. No rash noted. She is not diaphoretic. No erythema. No pallor.  Psychiatric: She has a normal mood and affect. Her behavior is normal. Judgment and thought content normal.  Vitals  reviewed.         Assessment & Plan:  Benign essential HTN - Plan: losartan (COZAAR) 100 MG tablet, COMPLETE METABOLIC PANEL WITH GFR, Lipid panel  Encounter for hepatitis C screening test for low risk patient - Plan: Hepatitis C Antibody  Pure hypercholesterolemia  General medical exam  Aside from her blood pressure today, the physical exam is completely normal.  I will start the patient on losartan 100 mg p.o. daily and recheck blood pressure in 3 weeks.  I will also check a CMP and a fasting lipid panel.  Immunizations are up-to-date except for the shingles vaccine.  We discussed the shingles vaccine and she will check on the price prior to receiving it.  Colonoscopy is up-to-date.  Mammogram is up-to-date.  Patient does not require a Pap smear.  She does have a history of a blood transfusion when she was a child.  She is also of the correct age and therefore I will screen the patient for hepatitis C.

## 2017-09-04 ENCOUNTER — Encounter: Payer: Self-pay | Admitting: Family Medicine

## 2017-09-04 LAB — COMPLETE METABOLIC PANEL WITH GFR
AG RATIO: 1.4 (calc) (ref 1.0–2.5)
ALBUMIN MSPROF: 3.8 g/dL (ref 3.6–5.1)
ALKALINE PHOSPHATASE (APISO): 72 U/L (ref 33–130)
ALT: 16 U/L (ref 6–29)
AST: 20 U/L (ref 10–35)
BILIRUBIN TOTAL: 0.9 mg/dL (ref 0.2–1.2)
BUN: 17 mg/dL (ref 7–25)
CHLORIDE: 104 mmol/L (ref 98–110)
CO2: 25 mmol/L (ref 20–32)
Calcium: 9.1 mg/dL (ref 8.6–10.4)
Creat: 0.95 mg/dL (ref 0.50–0.99)
GFR, Est African American: 71 mL/min/{1.73_m2} (ref >=60)
GFR, Est Non African American: 61 mL/min/{1.73_m2} (ref >=60)
GLOBULIN: 2.7 g/dL (ref 1.9–3.7)
GLUCOSE: 93 mg/dL (ref 65–99)
Potassium: 4 mmol/L (ref 3.5–5.3)
SODIUM: 141 mmol/L (ref 135–146)
TOTAL PROTEIN: 6.5 g/dL (ref 6.1–8.1)

## 2017-09-04 LAB — LIPID PANEL
CHOLESTEROL: 155 mg/dL (ref <200)
HDL: 67 mg/dL (ref >50)
LDL CHOLESTEROL (CALC): 74 mg/dL
Non-HDL Cholesterol (Calc): 88 mg/dL (calc) (ref <130)
Total CHOL/HDL Ratio: 2.3 (calc) (ref <5.0)
Triglycerides: 64 mg/dL (ref <150)

## 2017-09-04 LAB — HEPATITIS C ANTIBODY
Hepatitis C Ab: NONREACTIVE
SIGNAL TO CUT-OFF: 0.01 (ref <1.00)

## 2017-09-08 DIAGNOSIS — I8312 Varicose veins of left lower extremity with inflammation: Secondary | ICD-10-CM | POA: Diagnosis not present

## 2017-09-08 DIAGNOSIS — M7981 Nontraumatic hematoma of soft tissue: Secondary | ICD-10-CM | POA: Diagnosis not present

## 2017-09-28 DIAGNOSIS — M7981 Nontraumatic hematoma of soft tissue: Secondary | ICD-10-CM | POA: Diagnosis not present

## 2017-09-30 ENCOUNTER — Other Ambulatory Visit: Payer: Self-pay | Admitting: Family Medicine

## 2017-10-01 ENCOUNTER — Ambulatory Visit (INDEPENDENT_AMBULATORY_CARE_PROVIDER_SITE_OTHER): Payer: Medicare Other | Admitting: Family Medicine

## 2017-10-01 ENCOUNTER — Encounter: Payer: Self-pay | Admitting: Family Medicine

## 2017-10-01 VITALS — BP 160/86 | HR 90 | Temp 98.2°F | Resp 12 | Ht 65.0 in | Wt 157.0 lb

## 2017-10-01 DIAGNOSIS — I10 Essential (primary) hypertension: Secondary | ICD-10-CM

## 2017-10-01 NOTE — Progress Notes (Signed)
Subjective:    Patient ID: Allison Freeman, female    DOB: 03/05/1946, 72 y.o.   MRN: [id number]  HPI 09/03/17 Patient is here today for a complete physical exam.  She denies any concerns about falls or depression.  She denies any concerns about dementia.  Her only concern is a cough with chest congestion and a sore scratchy throat that she has had for 5 days.  Her exam today is unremarkable and I suspect that this is a viral upper respiratory infection.  However her blood pressure is extremely high today.  I rechecked this myself and verified it to be correct.  She has had some isolated readings that have been elevated and I believe she is showing signs of hypertension.  Her shot record is included below.  At that time, my plan was: Aside from her blood pressure today, the physical exam is completely normal.  I will start the patient on losartan 100 mg p.o. daily and recheck blood pressure in 3 weeks.  I will also check a CMP and a fasting lipid panel.  Immunizations are up-to-date except for the shingles vaccine.  We discussed the shingles vaccine and she will check on the price prior to receiving it.  Colonoscopy is up-to-date.  Mammogram is up-to-date.  Patient does not require a Pap smear.  She does have a history of a blood transfusion when she was a child.  She is also of the correct age and therefore I will screen the patient for hepatitis C.  10/01/17 Ultimately, the patient started losartan 100 mg by mouth daily. Her blood pressure today in office is 160/86. However she has white coat syndrome. She's been checking her blood pressure twice daily. Her blood pressure at home has been ranging between 118/80-145/90.  The average blood pressure is between 818 and 563 systolic. Diastolic blood pressures are almost all under control. She denies any chest pain shortness of breath or dyspnea on exertion Immunization History  Administered Date(s) Administered  . Influenza,inj,Quad PF,6+ Mos  06/23/2014, 06/27/2015, 06/27/2016  . Influenza-Unspecified 06/30/2013, 07/14/2017  . Pneumococcal Conjugate-13 06/23/2014  . Pneumococcal Polysaccharide-23 01/28/2016   Office Visit on 09/03/2017  Component Date Value Ref Range Status  . Glucose, Bld 09/03/2017 93  65 - 99 mg/dL Final   Comment: .            Fasting reference interval .   . BUN 09/03/2017 17  7 - 25 mg/dL Final  . Creat 09/03/2017 0.95  0.50 - 0.99 mg/dL Final   Comment: For patients >72 years of age, the reference limit for Creatinine is approximately 13% higher for people identified as African-American. .   . GFR, Est Non African American 09/03/2017 61  > OR = 60 mL/min/1.13m2 Final  . GFR, Est African American 09/03/2017 71  > OR = 60 mL/min/1.54m2 Final  . BUN/Creatinine Ratio 14/97/0263 NOT APPLICABLE  6 - 22 (calc) Final  . Sodium 09/03/2017 141  135 - 146 mmol/L Final  . Potassium 09/03/2017 4.0  3.5 - 5.3 mmol/L Final  . Chloride 09/03/2017 104  98 - 110 mmol/L Final  . CO2 09/03/2017 25  20 - 32 mmol/L Final  . Calcium 09/03/2017 9.1  8.6 - 10.4 mg/dL Final  . Total Protein 09/03/2017 6.5  6.1 - 8.1 g/dL Final  . Albumin 09/03/2017 3.8  3.6 - 5.1 g/dL Final  . Globulin 09/03/2017 2.7  1.9 - 3.7 g/dL (calc) Final  . AG Ratio 09/03/2017 1.4  1.0 -  2.5 (calc) Final  . Total Bilirubin 09/03/2017 0.9  0.2 - 1.2 mg/dL Final  . Alkaline phosphatase (APISO) 09/03/2017 72  33 - 130 U/L Final  . AST 09/03/2017 20  10 - 35 U/L Final  . ALT 09/03/2017 16  6 - 29 U/L Final  . Cholesterol 09/03/2017 155  <200 mg/dL Final  . HDL 09/03/2017 67  >50 mg/dL Final  . Triglycerides 09/03/2017 64  <150 mg/dL Final  . LDL Cholesterol (Calc) 09/03/2017 74  mg/dL (calc) Final   Comment: Reference range: <100 . Desirable range <100 mg/dL for primary prevention;   <70 mg/dL for patients with CHD or diabetic patients  with > or = 2 CHD risk factors. Marland Kitchen LDL-C is now calculated using the Martin-Hopkins  calculation, which is  a validated novel method providing  better accuracy than the Friedewald equation in the  estimation of LDL-C.  Cresenciano Genre et al. Annamaria Helling. 6789;381(01): 2061-2068  (http://education.[facility].com/faq/FAQ164)   . Total CHOL/HDL Ratio 09/03/2017 2.3  <5.0 (calc) Final  . Non-HDL Cholesterol (Calc) 09/03/2017 88  <130 mg/dL (calc) Final   Comment: For patients with diabetes plus 1 major ASCVD risk  factor, treating to a non-HDL-C goal of <100 mg/dL  (LDL-C of <70 mg/dL) is considered a therapeutic  option.   . Hepatitis C Ab 09/03/2017 NON-REACTIVE  NON-REACTI Final  . SIGNAL TO CUT-OFF 09/03/2017 0.01  <1.00 Final   Past Medical History:  Diagnosis Date  . Acid reflux   . Arthritis   . Depression   . Hypercholesterolemia   . Hypertension   . Insomnia   . Osteopenia   . Panic attacks   . Reflux   . Restless leg syndrome    Past Surgical History:  Procedure Laterality Date  . ABDOMINAL HYSTERECTOMY  2001  . BLADDER SURGERY    . BREAST BIOPSY  1987  . BREAST CYST EXCISION  1998  . CARPAL TUNNEL RELEASE Right 04/06/2015   Procedure: CARPAL TUNNEL RELEASE;  Surgeon: Carole Civil, MD;  Location: [facility];  Service: Orthopedics;  Laterality: Right;  . COLONOSCOPY  09/19/2004   BPZ:WCHENIDP hemorrhoids, otherwise normal rectum/Sigmoid diverticula.  Remainder of colon mucosa appeared normal  . COLONOSCOPY WITH ESOPHAGOGASTRODUODENOSCOPY (EGD) N/A 06/24/2013   OEU:MPNTIRW polyp-removed. Small hiatal hernia. Abnormal gastric mucosa-query NSAID effect-s/p (chronic inactive gastritis, negative H. pylori)/TCS: Prominent internal hemorrhoids-likely source of hematochezia. Colonic diverticulosis. Colonic polyp-removed (tubular adenoma)  . CYSTOCELE REPAIR  2010  . DILATION AND CURETTAGE OF UTERUS    . KNEE SURGERY Right 2003  . RECTAL SURGERY    . RECTOCELE REPAIR  2010  . TONSILLECTOMY     Current Outpatient Medications on File Prior to Visit  Medication Sig Dispense Refill  .  ALPRAZolam (XANAX) 0.5 MG tablet TAKE ONE TABLET BY MOUTH EVERY NIGHT AT BEDTIME 30 tablet 2  . aspirin EC 81 MG tablet Take 81 mg by mouth daily.    . fluticasone (FLONASE) 50 MCG/ACT nasal spray Use 2 sprays in each  nostril once daily 48 g 3  . hydroxypropyl methylcellulose / hypromellose (ISOPTO TEARS / GONIOVISC) 2.5 % ophthalmic solution Place 1 drop into both eyes 3 (three) times daily.    Marland Kitchen losartan (COZAAR) 100 MG tablet Take 1 tablet (100 mg total) by mouth daily. 90 tablet 3  . Magnesium 250 MG TABS Take by mouth.    . Multiple Vitamin (MULTIVITAMIN) tablet Take 1 tablet by mouth daily.    Marland Kitchen MYRBETRIQ 50 MG TB24 tablet Take  50 mg by mouth daily.     . Omega-3 Fatty Acids (FISH OIL) 1200 MG CAPS Take 1 capsule by mouth daily.    . pantoprazole (PROTONIX) 40 MG tablet Take 1 tablet by mouth  twice a day before meals 180 tablet 2  . pravastatin (PRAVACHOL) 40 MG tablet TAKE ONE TABLET BY MOUTH EVERY DAY. 90 tablet 1  . ranitidine (ZANTAC) 300 MG tablet Take 1 tablet (300 mg total) by mouth at bedtime. 90 tablet 3  . rOPINIRole (REQUIP) 1 MG tablet TAKE ONE (1) TABLET EACH DAY 90 tablet 1  . traMADol (ULTRAM) 50 MG tablet TAKE ONE TABLET BY MOUTH THREE TIMES DAILY AS NEEDED. *MUST LAST 90 DAYS* 270 tablet 0  . TURMERIC PO Take by mouth.     No current facility-administered medications on file prior to visit.    Allergies  Allergen Reactions  . Other     Arthritis medications- causes a bacteria in the abdomen   Social History   Socioeconomic History  . Marital status: Married    Spouse name: Not on file  . Number of children: Not on file  . Years of education: Not on file  . Highest education level: Not on file  Social Needs  . Financial resource strain: Not on file  . Food insecurity - worry: Not on file  . Food insecurity - inability: Not on file  . Transportation needs - medical: Not on file  . Transportation needs - non-medical: Not on file  Occupational History  . Not  on file  Tobacco Use  . Smoking status: Never Smoker  . Smokeless tobacco: Never Used  Substance and Sexual Activity  . Alcohol use: No  . Drug use: No  . Sexual activity: Yes    Birth control/protection: Surgical  Other Topics Concern  . Not on file  Social History Narrative  . Not on file    Review of Systems  All other systems reviewed and are negative.      Objective:   Physical Exam  Constitutional: She is oriented to person, place, and time. She appears well-developed and well-nourished. No distress.  HENT:  Head: Normocephalic and atraumatic.  Right Ear: External ear normal.  Left Ear: External ear normal.  Nose: Nose normal.  Mouth/Throat: Oropharynx is clear and moist. No oropharyngeal exudate.  Eyes: Conjunctivae and EOM are normal. Pupils are equal, round, and reactive to light. Right eye exhibits no discharge. Left eye exhibits no discharge. No scleral icterus.  Neck: Normal range of motion. Neck supple. No JVD present. No tracheal deviation present. No thyromegaly present.  Cardiovascular: Normal rate, regular rhythm, normal heart sounds and intact distal pulses. Exam reveals no gallop and no friction rub.  No murmur heard. Pulmonary/Chest: Effort normal and breath sounds normal. No stridor. No respiratory distress. She has no wheezes. She has no rales. She exhibits no tenderness.  Abdominal: Soft. Bowel sounds are normal. She exhibits no distension and no mass. There is no tenderness. There is no rebound and no guarding.  Musculoskeletal: Normal range of motion. She exhibits no edema, tenderness or deformity.  Lymphadenopathy:    She has no cervical adenopathy.  Neurological: She is alert and oriented to person, place, and time. She has normal reflexes. No cranial nerve deficit. She exhibits normal muscle tone. Coordination normal.  Skin: Skin is warm. No rash noted. She is not diaphoretic. No erythema. No pallor.  Psychiatric: She has a normal mood and affect.  Her behavior is normal.  Judgment and thought content normal.  Vitals reviewed.         Assessment & Plan:  Benign essential HTN Blood pressure at home is acceptable. Continue losartan 100 mg by mouth daily. I've continued asked the patient to monitor her blood pressure closely. Blood pressure is consistently greater than 161 systolic, I would add hydrochlorothiazide. At the present time however we will monitor her blood pressure is is borderline

## 2017-10-08 ENCOUNTER — Encounter: Payer: Self-pay | Admitting: Family Medicine

## 2017-10-08 MED ORDER — PRAVASTATIN SODIUM 40 MG PO TABS: 40.0000 mg | ORAL_TABLET | Freq: Every day | ORAL | 1 refills | Status: DC

## 2017-10-13 ENCOUNTER — Ambulatory Visit (INDEPENDENT_AMBULATORY_CARE_PROVIDER_SITE_OTHER): Payer: Medicare Other | Admitting: Family Medicine

## 2017-10-13 ENCOUNTER — Encounter: Payer: Self-pay | Admitting: Family Medicine

## 2017-10-13 VITALS — BP 146/88 | HR 76 | Temp 97.7°F | Resp 14 | Ht 65.0 in | Wt 157.0 lb

## 2017-10-13 DIAGNOSIS — M4696 Unspecified inflammatory spondylopathy, lumbar region: Secondary | ICD-10-CM | POA: Diagnosis not present

## 2017-10-13 DIAGNOSIS — M47816 Spondylosis without myelopathy or radiculopathy, lumbar region: Secondary | ICD-10-CM

## 2017-10-13 MED ORDER — PREDNISONE 20 MG PO TABS
ORAL_TABLET | ORAL | 0 refills | Status: DC
Start: 2017-10-13 — End: 2018-08-26

## 2017-10-13 NOTE — Progress Notes (Signed)
Subjective:    Patient ID: Allison Freeman, female    DOB: 1946/05/13, 72 y.o.   MRN: [id number]  HPI Patient has a history of chronic low back pain. On an MRI of the lumbar spine in 2014, she was found to have severe arthritis in the facet joint between L4 and L5 and a synovial cyst arising from the joint actually causing right-sided nerve impingement. She was initially treated with prednisone and even had an epidural steroid injection and symptoms gradually improved. Over the last several weeks, the pain has arisen in the exact same area. She denies any numbness or tingling radiating down her leg but she does complain of a constant bandlike pain around the level of L4-L5 in her lower back. She denies any hematuria, dysuria, urgency, frequency. She denies any fevers or chills. Past Medical History:  Diagnosis Date  . Acid reflux   . Arthritis   . Depression   . Hypercholesterolemia   . Hypertension   . Insomnia   . Osteopenia   . Panic attacks   . Reflux   . Restless leg syndrome    Past Surgical History:  Procedure Laterality Date  . ABDOMINAL HYSTERECTOMY  2001  . BLADDER SURGERY    . BREAST BIOPSY  1987  . BREAST CYST EXCISION  1998  . CARPAL TUNNEL RELEASE Right 04/06/2015   Procedure: CARPAL TUNNEL RELEASE;  Surgeon: Carole Civil, MD;  Location: [facility];  Service: Orthopedics;  Laterality: Right;  . COLONOSCOPY  09/19/2004   OEH:[id number] hemorrhoids, otherwise normal rectum/Sigmoid diverticula.  Remainder of colon mucosa appeared normal  . COLONOSCOPY WITH ESOPHAGOGASTRODUODENOSCOPY (EGD) N/A 06/24/2013   OIB:BCWUGQB polyp-removed. Small hiatal hernia. Abnormal gastric mucosa-query NSAID effect-s/p (chronic inactive gastritis, negative H. pylori)/TCS: Prominent internal hemorrhoids-likely source of hematochezia. Colonic diverticulosis. Colonic polyp-removed (tubular adenoma)  . CYSTOCELE REPAIR  2010  . DILATION AND CURETTAGE OF UTERUS    . KNEE SURGERY Right 2003  .  RECTAL SURGERY    . RECTOCELE REPAIR  2010  . TONSILLECTOMY     Current Outpatient Medications on File Prior to Visit  Medication Sig Dispense Refill  . ALPRAZolam (XANAX) 0.5 MG tablet TAKE ONE TABLET BY MOUTH EVERY NIGHT AT BEDTIME 30 tablet 2  . aspirin EC 81 MG tablet Take 81 mg by mouth daily.    . fluticasone (FLONASE) 50 MCG/ACT nasal spray Use 2 sprays in each  nostril once daily 48 g 3  . hydroxypropyl methylcellulose / hypromellose (ISOPTO TEARS / GONIOVISC) 2.5 % ophthalmic solution Place 1 drop into both eyes 3 (three) times daily.    Marland Kitchen losartan (COZAAR) 100 MG tablet Take 1 tablet (100 mg total) by mouth daily. 90 tablet 3  . Magnesium 250 MG TABS Take by mouth.    . Multiple Vitamin (MULTIVITAMIN) tablet Take 1 tablet by mouth daily.    Marland Kitchen MYRBETRIQ 50 MG TB24 tablet Take 50 mg by mouth daily.     . Omega-3 Fatty Acids (FISH OIL) 1200 MG CAPS Take 1 capsule by mouth daily.    . pantoprazole (PROTONIX) 40 MG tablet Take 1 tablet by mouth  twice a day before meals 180 tablet 2  . pravastatin (PRAVACHOL) 40 MG tablet Take 1 tablet (40 mg total) by mouth daily. 90 tablet 1  . ranitidine (ZANTAC) 300 MG tablet Take 1 tablet (300 mg total) by mouth at bedtime. 90 tablet 3  . rOPINIRole (REQUIP) 1 MG tablet TAKE ONE (1) TABLET EACH DAY 90 tablet  1  . traMADol (ULTRAM) 50 MG tablet TAKE ONE TABLET BY MOUTH THREE TIMES DAILY AS NEEDED. *MUST LAST 90 DAYS* 270 tablet 0  . TURMERIC PO Take by mouth.     No current facility-administered medications on file prior to visit.    Allergies  Allergen Reactions  . Other     Arthritis medications- causes a bacteria in the abdomen   Social History   Socioeconomic History  . Marital status: Married    Spouse name: Not on file  . Number of children: Not on file  . Years of education: Not on file  . Highest education level: Not on file  Social Needs  . Financial resource strain: Not on file  . Food insecurity - worry: Not on file  . Food  insecurity - inability: Not on file  . Transportation needs - medical: Not on file  . Transportation needs - non-medical: Not on file  Occupational History  . Not on file  Tobacco Use  . Smoking status: Never Smoker  . Smokeless tobacco: Never Used  Substance and Sexual Activity  . Alcohol use: No  . Drug use: No  . Sexual activity: Yes    Birth control/protection: Surgical  Other Topics Concern  . Not on file  Social History Narrative  . Not on file      Review of Systems  All other systems reviewed and are negative.      Objective:   Physical Exam  Cardiovascular: Normal rate, regular rhythm and normal heart sounds.  Pulmonary/Chest: Effort normal and breath sounds normal. No respiratory distress. She has no wheezes. She has no rales.  Musculoskeletal:       Lumbar back: She exhibits decreased range of motion, tenderness, bony tenderness and pain.       Back:  Vitals reviewed.         Assessment & Plan:  Facet arthritis of lumbar region ([place])  Begin prednisone taper pack. If symptoms do not improve, I would consider epidural steroid injection at L4-5.  Patient has already tried and failed NSAIDs over-the-counter.

## 2017-10-14 ENCOUNTER — Encounter: Payer: Self-pay | Admitting: Family Medicine

## 2017-10-14 MED ORDER — PANTOPRAZOLE SODIUM 40 MG PO TBEC: DELAYED_RELEASE_TABLET | ORAL | 2 refills | Status: DC

## 2017-10-16 ENCOUNTER — Encounter: Payer: Self-pay | Admitting: Family Medicine

## 2017-11-24 ENCOUNTER — Other Ambulatory Visit: Payer: Self-pay | Admitting: Family Medicine

## 2017-11-24 NOTE — Telephone Encounter (Signed)
Ok to refill??  Last office visit 10/13/2017.  Last refill 08/25/2017, #2 refills.

## 2017-12-01 DIAGNOSIS — G8929 Other chronic pain: Secondary | ICD-10-CM | POA: Diagnosis not present

## 2017-12-01 DIAGNOSIS — M7062 Trochanteric bursitis, left hip: Secondary | ICD-10-CM | POA: Diagnosis not present

## 2017-12-01 DIAGNOSIS — M170 Bilateral primary osteoarthritis of knee: Secondary | ICD-10-CM | POA: Diagnosis not present

## 2017-12-01 DIAGNOSIS — M7061 Trochanteric bursitis, right hip: Secondary | ICD-10-CM | POA: Insufficient documentation

## 2017-12-03 ENCOUNTER — Ambulatory Visit (INDEPENDENT_AMBULATORY_CARE_PROVIDER_SITE_OTHER): Payer: Medicare Other | Admitting: Otolaryngology

## 2017-12-03 DIAGNOSIS — H7203 Central perforation of tympanic membrane, bilateral: Secondary | ICD-10-CM | POA: Diagnosis not present

## 2017-12-03 DIAGNOSIS — H6121 Impacted cerumen, right ear: Secondary | ICD-10-CM

## 2017-12-03 DIAGNOSIS — H7201 Central perforation of tympanic membrane, right ear: Secondary | ICD-10-CM

## 2017-12-03 DIAGNOSIS — H6983 Other specified disorders of Eustachian tube, bilateral: Secondary | ICD-10-CM | POA: Diagnosis not present

## 2017-12-03 DIAGNOSIS — H903 Sensorineural hearing loss, bilateral: Secondary | ICD-10-CM | POA: Diagnosis not present

## 2017-12-10 DIAGNOSIS — H43813 Vitreous degeneration, bilateral: Secondary | ICD-10-CM | POA: Diagnosis not present

## 2017-12-14 ENCOUNTER — Other Ambulatory Visit: Payer: Self-pay | Admitting: Family Medicine

## 2017-12-14 NOTE — Telephone Encounter (Signed)
Ok to refill??  Last office visit 10/13/2017.  Last refill 07/14/2017.

## 2017-12-16 ENCOUNTER — Other Ambulatory Visit: Payer: Self-pay | Admitting: Family Medicine

## 2017-12-17 ENCOUNTER — Telehealth: Payer: Self-pay | Admitting: Family Medicine

## 2017-12-17 NOTE — Telephone Encounter (Signed)
PA Submitted through [email or web address] and received the following:  [facility] has received your information, and the request will be reviewed. You may close this dialog, return to your dashboard, and perform other tasks. You will receive an electronic determination in CoverMyMeds. You can see the latest determination by locating this request on your dashboard or by reopening this request. You will also receive a faxed copy of the determination. If you have any questions please contact [facility] at [phone].

## 2017-12-18 NOTE — Telephone Encounter (Signed)
PA Case: [id number], Status: Approved, Coverage Starts on: 12/17/2017 12:00:00 AM, Coverage Ends on: 12/31/2017 12:00:00 AM. Pharm aware

## 2018-01-07 DIAGNOSIS — H43813 Vitreous degeneration, bilateral: Secondary | ICD-10-CM | POA: Diagnosis not present

## 2018-01-21 ENCOUNTER — Ambulatory Visit (INDEPENDENT_AMBULATORY_CARE_PROVIDER_SITE_OTHER): Payer: Medicare Other | Admitting: Otolaryngology

## 2018-01-21 DIAGNOSIS — H7202 Central perforation of tympanic membrane, left ear: Secondary | ICD-10-CM

## 2018-01-21 DIAGNOSIS — H6121 Impacted cerumen, right ear: Secondary | ICD-10-CM

## 2018-01-22 ENCOUNTER — Ambulatory Visit (INDEPENDENT_AMBULATORY_CARE_PROVIDER_SITE_OTHER): Payer: Medicare Other | Admitting: Urology

## 2018-01-22 DIAGNOSIS — Z87440 Personal history of urinary (tract) infections: Secondary | ICD-10-CM | POA: Diagnosis not present

## 2018-01-22 DIAGNOSIS — R350 Frequency of micturition: Secondary | ICD-10-CM

## 2018-01-22 DIAGNOSIS — N3941 Urge incontinence: Secondary | ICD-10-CM

## 2018-02-02 DIAGNOSIS — L818 Other specified disorders of pigmentation: Secondary | ICD-10-CM | POA: Diagnosis not present

## 2018-02-02 DIAGNOSIS — L570 Actinic keratosis: Secondary | ICD-10-CM | POA: Diagnosis not present

## 2018-02-02 DIAGNOSIS — L819 Disorder of pigmentation, unspecified: Secondary | ICD-10-CM | POA: Diagnosis not present

## 2018-02-11 ENCOUNTER — Ambulatory Visit (INDEPENDENT_AMBULATORY_CARE_PROVIDER_SITE_OTHER): Payer: Medicare Other | Admitting: Otolaryngology

## 2018-02-11 DIAGNOSIS — H7201 Central perforation of tympanic membrane, right ear: Secondary | ICD-10-CM

## 2018-02-11 DIAGNOSIS — H6983 Other specified disorders of Eustachian tube, bilateral: Secondary | ICD-10-CM

## 2018-03-09 ENCOUNTER — Encounter: Payer: Self-pay | Admitting: Family Medicine

## 2018-03-15 ENCOUNTER — Other Ambulatory Visit: Payer: Self-pay | Admitting: Family Medicine

## 2018-03-15 NOTE — Telephone Encounter (Signed)
Ok to refill??  Last office visit 10/13/2017.  Last refill 11/24/2017, #2 refills.

## 2018-03-16 ENCOUNTER — Encounter: Payer: Self-pay | Admitting: Family Medicine

## 2018-03-16 ENCOUNTER — Ambulatory Visit (INDEPENDENT_AMBULATORY_CARE_PROVIDER_SITE_OTHER): Payer: Medicare Other | Admitting: Family Medicine

## 2018-03-16 VITALS — BP 120/70 | HR 74 | Temp 97.8°F | Resp 14 | Ht 65.0 in | Wt 158.0 lb

## 2018-03-16 DIAGNOSIS — J309 Allergic rhinitis, unspecified: Secondary | ICD-10-CM | POA: Diagnosis not present

## 2018-03-16 DIAGNOSIS — H6093 Unspecified otitis externa, bilateral: Secondary | ICD-10-CM | POA: Diagnosis not present

## 2018-03-16 DIAGNOSIS — J310 Chronic rhinitis: Secondary | ICD-10-CM

## 2018-03-16 DIAGNOSIS — J330 Polyp of nasal cavity: Secondary | ICD-10-CM

## 2018-03-16 DIAGNOSIS — J329 Chronic sinusitis, unspecified: Secondary | ICD-10-CM | POA: Diagnosis not present

## 2018-03-16 MED ORDER — OFLOXACIN 0.3 % OP SOLN: 4.0000 [drp] | Freq: Four times a day (QID) | OPHTHALMIC | 0 refills | Status: DC

## 2018-03-16 MED ORDER — AMOXICILLIN-POT CLAVULANATE 875-125 MG PO TABS: 1.0000 | ORAL_TABLET | Freq: Two times a day (BID) | ORAL | 0 refills | Status: DC

## 2018-03-16 MED ORDER — MONTELUKAST SODIUM 10 MG PO TABS: 10.0000 mg | ORAL_TABLET | Freq: Every day | ORAL | 3 refills | Status: DC

## 2018-03-16 NOTE — Patient Instructions (Addendum)
I would try switching your nasal spray to the generic Nasonex -do 2 sprays in each nostril, pointing the bottle towards your ear (aiming) and lightly sniff and pinch her nose after you are done to distribute the medicine.  If you are tasting the medicine then you are wasting it.  You should see improvement with the steroids locally in your sinuses if you are using the medicine correctly and daily for more than a week.  Add an over-the-counter antihistamine such as Zyrtec, Claritin, Allegra or generic equivalent.  I have also added montelukast, it is a prescription allergy medicine.  You can use all three to prevent allergies  Use nasal saline spray 1-2 x a day to help moisturize and irrigate nose and sinuses.  I would try these treatments and Sudafed (which you have to get from the pharmacy) for 2-3 days.  If not feeling better, or if suddenly worsening with fever, then I would start the antibiotic for treatment of acute bacterial sinusitis.  Because of both of your ENT issues - nasal polyps and right tube in ear drum, I would try and get a follow-up appointment with your ENT in 2 weeks.  These contact us if you need a new referral to go back to them.

## 2018-03-20 NOTE — Progress Notes (Signed)
Patient ID: Allison Freeman, female    DOB: [birth year], 73 y.o.   MRN: [id number]  PCP: Susy Frizzle, MD  Chief Complaint  Patient presents with  . Sinusitis    Subjective:   Allison Freeman is a 72 y.o. female, presents to clinic with CC of nasal congestion with discharge, sneezing and itching, sinus headache x 1 week with worsening and spreading of moderate pain described as ache and pressure to her teeth. Her ears feel full and are popping.  All sx have been persistent and gradually worsening despite OTC meds and using flonase.  All the drainage is making her acid reflux and indigestion worse despite using protonix.  She denies fevers, chills, sweats, body ache, cough, SOB, CP.     Patient Active Problem List   Diagnosis Date Noted  . Carpal tunnel syndrome of right wrist   . Osteoarthritis 01/21/2014  . Abdominal pain, epigastric 06/16/2013  . Rectal bleeding 06/16/2013  . Hemorrhoids 06/16/2013  . Other and unspecified hyperlipidemia 05/16/2013  . Allergic rhinitis 05/16/2013  . Esophageal reflux 04/15/2013  . Difficulty in walking(719.7) 12/20/2012  . Bilateral leg weakness 12/20/2012  . Hip bursitis 12/14/2012  . Back pain 12/14/2012  . Hip pain, bilateral 12/14/2012     Prior to Admission medications   Medication Sig Start Date End Date Taking? Authorizing Provider  ALPRAZolam Duanne Moron) 0.5 MG tablet TAKE ONE TABLET BY MOUTH EVERY NIGHT AT BEDTIME 03/15/18  Yes Orlena Sheldon, PA-C  aspirin EC 81 MG tablet Take 81 mg by mouth daily.   Yes [provider]  fluticasone Asencion Islam) 50 MCG/ACT nasal spray Use 2 sprays in each  nostril once daily 05/30/15  Yes Luking, Grace Bushy, MD  hydroxypropyl methylcellulose / hypromellose (ISOPTO TEARS / GONIOVISC) 2.5 % ophthalmic solution Place 1 drop into both eyes 3 (three) times daily.   Yes [provider]  losartan (COZAAR) 100 MG tablet Take 1 tablet (100 mg total) by mouth daily. 09/03/17  Yes Susy Frizzle, MD  Magnesium 250 MG TABS Take by mouth.   Yes [provider]  Multiple Vitamin (MULTIVITAMIN) tablet Take 1 tablet by mouth daily.   Yes [provider]  MYRBETRIQ 50 MG TB24 tablet Take 50 mg by mouth daily.  06/19/15  Yes [provider]  Omega-3 Fatty Acids (FISH OIL) 1200 MG CAPS Take 1 capsule by mouth daily.   Yes [provider]  pantoprazole (PROTONIX) 40 MG tablet Take 1 tablet by mouth  twice a day before meals 10/14/17  Yes Susy Frizzle, MD  pravastatin (PRAVACHOL) 40 MG tablet Take 1 tablet (40 mg total) by mouth daily. 10/08/17  Yes Susy Frizzle, MD  predniSONE (DELTASONE) 20 MG tablet 3 tabs poqday 1-2, 2 tabs poqday 3-4, 1 tab poqday 5-6 10/13/17  Yes Pickard, Cammie Mcgee, MD  ranitidine (ZANTAC) 300 MG tablet Take 1 tablet (300 mg total) by mouth at bedtime. 05/21/17  Yes Susy Frizzle, MD  rOPINIRole (REQUIP) 1 MG tablet TAKE ONE (1) TABLET BY MOUTH EVERY DAY 12/16/17  Yes Susy Frizzle, MD  traMADol (ULTRAM) 50 MG tablet TAKE ONE TABLET BY MOUTH THREE TIMES DAILY AS NEEDED. 12/15/17  Yes Susy Frizzle, MD  TURMERIC PO Take by mouth.   Yes [provider]  amoxicillin-clavulanate (AUGMENTIN) 875-125 MG tablet Take 1 tablet by mouth 2 (two) times daily. 03/16/18   Delsa Grana, PA-C  montelukast (SINGULAIR) 10 MG tablet Take 1  tablet (10 mg total) by mouth at bedtime. 03/16/18   Delsa Grana, PA-C  ofloxacin (OCUFLOX) 0.3 % ophthalmic solution Place 4 drops into both ears 4 (four) times daily. 03/16/18   Delsa Grana, PA-C     Allergies  Allergen Reactions  . Other     Arthritis medications- causes a bacteria in the abdomen     Family History  Problem Relation Age of Onset  . Cancer Mother        Breast  . Hypertension Father   . Heart attack Father   . Cancer Sister        Breast  . Cancer Sister        breast  . Colon cancer Neg Hx      Social History   Socioeconomic History  . Marital  status: Married    Spouse name: Not on file  . Number of children: Not on file  . Years of education: Not on file  . Highest education level: Not on file  Occupational History  . Not on file  Social Needs  . Financial resource strain: Not on file  . Food insecurity:    Worry: Not on file    Inability: Not on file  . Transportation needs:    Medical: Not on file    Non-medical: Not on file  Tobacco Use  . Smoking status: Never Smoker  . Smokeless tobacco: Never Used  Substance and Sexual Activity  . Alcohol use: No  . Drug use: No  . Sexual activity: Yes    Birth control/protection: Surgical  Lifestyle  . Physical activity:    Days per week: Not on file    Minutes per session: Not on file  . Stress: Not on file  Relationships  . Social connections:    Talks on phone: Not on file    Gets together: Not on file    Attends religious service: Not on file    Active member of club or organization: Not on file    Attends meetings of clubs or organizations: Not on file    Relationship status: Not on file  . Intimate partner violence:    Fear of current or ex partner: Not on file    Emotionally abused: Not on file    Physically abused: Not on file    Forced sexual activity: Not on file  Other Topics Concern  . Not on file  Social History Narrative  . Not on file     Review of Systems     Objective:    Vitals:   03/16/18 1555  BP: 120/70  Pulse: 74  Resp: 14  Temp: 97.8 F (36.6 C)  TempSrc: Oral  SpO2: 99%  Weight: 158 lb (71.7 kg)  Height: 5\' 5"  (1.651 m)      Physical Exam  Constitutional: She is oriented to person, place, and time. She appears well-developed and well-nourished.  Non-toxic appearance. No distress.  Mildly ill appearing female, NAD  HENT:  Head: Normocephalic and atraumatic.  Right Ear: External ear normal.  Left Ear: External ear normal.  Mouth/Throat: Uvula is midline, oropharynx is clear and moist and mucous membranes are normal. No  oropharyngeal exudate.  B/L EAC edematous erythematous, no purulent discharge, TMs b/l clear and normal B/L nasal tubinates severely enlarged and erythematous, right nasal polyp obstructing between right turbinate and septum Moderate sinus TTP to b/l maxillary and frontal sinus  Eyes: Pupils are equal, round, and reactive to light. Conjunctivae, EOM and lids  are normal. No scleral icterus.  Neck: Normal range of motion and phonation normal. Neck supple. No tracheal deviation present.  Cardiovascular: Normal rate, regular rhythm, normal heart sounds and normal pulses. Exam reveals no gallop and no friction rub.  No murmur heard. Pulses:      Radial pulses are 2+ on the right side, and 2+ on the left side.       Posterior tibial pulses are 2+ on the right side, and 2+ on the left side.  Pulmonary/Chest: Effort normal and breath sounds normal. No stridor. No respiratory distress. She has no wheezes. She has no rhonchi. She has no rales. She exhibits no tenderness.  Abdominal: Soft. Normal appearance and bowel sounds are normal. She exhibits no distension. There is no tenderness. There is no rebound and no guarding.  Musculoskeletal: Normal range of motion. She exhibits no edema or deformity.  Lymphadenopathy:    She has no cervical adenopathy.  Neurological: She is alert and oriented to person, place, and time. She exhibits normal muscle tone. Gait normal.  Skin: Skin is warm, dry and intact. Capillary refill takes less than 2 seconds. No rash noted. She is not diaphoretic. No pallor.  Psychiatric: She has a normal mood and affect. Her speech is normal and behavior is normal.  Nursing note and vitals reviewed.         Assessment & Plan:      ICD-10-CM   1. Rhinosinusitis J32.9 amoxicillin-clavulanate (AUGMENTIN) 875-125 MG tablet    montelukast (SINGULAIR) 10 MG tablet  2. Polyp of right nasal cavity J33.0 montelukast (SINGULAIR) 10 MG tablet  3. Otitis externa of both ears, unspecified  chronicity, unspecified type H60.93 ofloxacin (OCUFLOX) 0.3 % ophthalmic solution  4. Allergic rhinitis, unspecified seasonality, unspecified trigger J30.9      Sx of rhinosinusitis for 1 week +.  Has sx of chronic allergies, has right nasal polyp with loss of patentcy to nare, also has moderate sinus ttp and external otitis media bilaterally.  Will attempt to maximize allergy tx and use sudafed first for 2-3 days, but if sx do not improve, abx tx for bacterial sinusitis is indicated from length of sx and/or with worsening.  Pt agrees to this plan.  She may need ENT referral for eval of polyp if not improved with this tx plan.   Delsa Grana, PA-C 03/20/18 4:21 PM

## 2018-03-22 ENCOUNTER — Other Ambulatory Visit: Payer: Self-pay | Admitting: Family Medicine

## 2018-03-22 DIAGNOSIS — Z1231 Encounter for screening mammogram for malignant neoplasm of breast: Secondary | ICD-10-CM

## 2018-04-01 ENCOUNTER — Other Ambulatory Visit: Payer: Medicare Other

## 2018-04-01 DIAGNOSIS — E7800 Pure hypercholesterolemia, unspecified: Secondary | ICD-10-CM

## 2018-04-01 DIAGNOSIS — E611 Iron deficiency: Secondary | ICD-10-CM | POA: Diagnosis not present

## 2018-04-01 DIAGNOSIS — I10 Essential (primary) hypertension: Secondary | ICD-10-CM

## 2018-04-02 ENCOUNTER — Encounter (INDEPENDENT_AMBULATORY_CARE_PROVIDER_SITE_OTHER): Payer: Self-pay

## 2018-04-02 LAB — COMPREHENSIVE METABOLIC PANEL
AG Ratio: 1.4 (calc) (ref 1.0–2.5)
ALT: 16 U/L (ref 6–29)
AST: 19 U/L (ref 10–35)
Albumin: 3.8 g/dL (ref 3.6–5.1)
Alkaline phosphatase (APISO): 58 U/L (ref 33–130)
BUN: 19 mg/dL (ref 7–25)
CO2: 28 mmol/L (ref 20–32)
Calcium: 9.1 mg/dL (ref 8.6–10.4)
Chloride: 107 mmol/L (ref 98–110)
Creat: 0.85 mg/dL (ref 0.50–0.99)
Globulin: 2.8 g/dL (calc) (ref 1.9–3.7)
Glucose, Bld: 93 mg/dL (ref 65–99)
Potassium: 4.9 mmol/L (ref 3.5–5.3)
Sodium: 140 mmol/L (ref 135–146)
Total Bilirubin: 0.9 mg/dL (ref 0.2–1.2)
Total Protein: 6.6 g/dL (ref 6.1–8.1)

## 2018-04-02 LAB — CBC WITH DIFFERENTIAL/PLATELET
BASOS ABS: 29 {cells}/uL (ref 0–200)
BASOS PCT: 0.6 %
Eosinophils Absolute: 98 cells/uL (ref 15–500)
Eosinophils Relative: 2 %
HCT: 39 % (ref 35.0–45.0)
HEMOGLOBIN: 12.9 g/dL (ref 11.7–15.5)
Lymphs Abs: 1480 cells/uL (ref 850–3900)
MCH: 30.1 pg (ref 27.0–33.0)
MCHC: 33.1 g/dL (ref 32.0–36.0)
MCV: 90.9 fL (ref 80.0–100.0)
MPV: 10.7 fL (ref 7.5–12.5)
Monocytes Relative: 9.7 %
Neutro Abs: 2818 cells/uL (ref 1500–7800)
Neutrophils Relative %: 57.5 %
PLATELETS: 229 10*3/uL (ref 140–400)
RBC: 4.29 10*6/uL (ref 3.80–5.10)
RDW: 12.3 % (ref 11.0–15.0)
TOTAL LYMPHOCYTE: 30.2 %
WBC: 4.9 10*3/uL (ref 3.8–10.8)
WBCMIX: 475 {cells}/uL (ref 200–950)

## 2018-04-02 LAB — LIPID PANEL
Cholesterol: 165 mg/dL (ref <200)
HDL: 54 mg/dL (ref >50)
LDL Cholesterol (Calc): 94 mg/dL (calc)
Non-HDL Cholesterol (Calc): 111 mg/dL (calc) (ref <130)
Total CHOL/HDL Ratio: 3.1 (calc) (ref <5.0)
Triglycerides: 83 mg/dL (ref <150)

## 2018-04-12 ENCOUNTER — Encounter: Payer: Self-pay | Admitting: Family Medicine

## 2018-04-23 ENCOUNTER — Ambulatory Visit (HOSPITAL_COMMUNITY)
Admission: RE | Admit: 2018-04-23 | Discharge: 2018-04-23 | Disposition: A | Discharge status: Home / Self Care | Payer: Medicare Other | Source: Ambulatory Visit | Attending: Family Medicine | Admitting: Family Medicine

## 2018-04-23 DIAGNOSIS — Z1231 Encounter for screening mammogram for malignant neoplasm of breast: Secondary | ICD-10-CM | POA: Insufficient documentation

## 2018-05-12 ENCOUNTER — Encounter: Payer: Self-pay | Admitting: Internal Medicine

## 2018-05-20 ENCOUNTER — Other Ambulatory Visit: Payer: Self-pay | Admitting: Family Medicine

## 2018-05-20 NOTE — Telephone Encounter (Signed)
Requesting refill      LOV: 10/13/17  LRF:  12/15/17

## 2018-05-21 ENCOUNTER — Telehealth: Payer: Self-pay | Admitting: Family Medicine

## 2018-05-21 NOTE — Telephone Encounter (Signed)
PA Submitted through [email or web address] and received the following:  [facility] has received your information, and the request will be reviewed. You may close this dialog, return to your dashboard, and perform other tasks.  You will receive an electronic determination in CoverMyMeds. You can see the latest determination by locating this request on your dashboard or by reopening this request. You will also receive a faxed copy of the determination. If you have any questions please contact [facility] at [phone].  If you need assistance, please chat with CoverMyMeds or call us at [phone]. 

## 2018-05-25 DIAGNOSIS — M170 Bilateral primary osteoarthritis of knee: Secondary | ICD-10-CM | POA: Diagnosis not present

## 2018-05-25 DIAGNOSIS — G8929 Other chronic pain: Secondary | ICD-10-CM | POA: Diagnosis not present

## 2018-05-25 NOTE — Telephone Encounter (Signed)
PA Case: [id number], Status: Approved, Coverage Starts on: 05/21/2018 12:00:00 AM, Coverage Ends on: 06/04/2018 12:00:00 AM.  Pt aware via mychart

## 2018-06-08 DIAGNOSIS — Z23 Encounter for immunization: Secondary | ICD-10-CM | POA: Diagnosis not present

## 2018-06-16 ENCOUNTER — Ambulatory Visit: Payer: Medicare Other

## 2018-06-21 DIAGNOSIS — M7740 Metatarsalgia, unspecified foot: Secondary | ICD-10-CM | POA: Diagnosis not present

## 2018-06-21 DIAGNOSIS — L851 Acquired keratosis [keratoderma] palmaris et plantaris: Secondary | ICD-10-CM | POA: Diagnosis not present

## 2018-06-21 DIAGNOSIS — M79672 Pain in left foot: Secondary | ICD-10-CM | POA: Diagnosis not present

## 2018-06-23 ENCOUNTER — Other Ambulatory Visit: Payer: Self-pay | Admitting: Physician Assistant

## 2018-06-23 NOTE — Telephone Encounter (Signed)
Last OV 10/01/2017 Last refill 03/15/2018 Ok to refill?

## 2018-07-01 ENCOUNTER — Other Ambulatory Visit: Payer: Self-pay | Admitting: Family Medicine

## 2018-07-01 DIAGNOSIS — K219 Gastro-esophageal reflux disease without esophagitis: Secondary | ICD-10-CM

## 2018-07-12 ENCOUNTER — Encounter: Payer: Self-pay | Admitting: Family Medicine

## 2018-07-13 ENCOUNTER — Other Ambulatory Visit: Payer: Self-pay | Admitting: Family Medicine

## 2018-07-26 ENCOUNTER — Other Ambulatory Visit: Payer: Self-pay | Admitting: Family Medicine

## 2018-07-26 DIAGNOSIS — M7740 Metatarsalgia, unspecified foot: Secondary | ICD-10-CM | POA: Diagnosis not present

## 2018-07-26 DIAGNOSIS — L851 Acquired keratosis [keratoderma] palmaris et plantaris: Secondary | ICD-10-CM | POA: Diagnosis not present

## 2018-07-26 DIAGNOSIS — M79672 Pain in left foot: Secondary | ICD-10-CM | POA: Diagnosis not present

## 2018-07-30 ENCOUNTER — Ambulatory Visit (INDEPENDENT_AMBULATORY_CARE_PROVIDER_SITE_OTHER): Payer: Medicare Other | Admitting: Urology

## 2018-07-30 DIAGNOSIS — N3941 Urge incontinence: Secondary | ICD-10-CM | POA: Diagnosis not present

## 2018-07-30 DIAGNOSIS — N952 Postmenopausal atrophic vaginitis: Secondary | ICD-10-CM | POA: Diagnosis not present

## 2018-07-30 DIAGNOSIS — Z87440 Personal history of urinary (tract) infections: Secondary | ICD-10-CM

## 2018-08-12 ENCOUNTER — Other Ambulatory Visit: Payer: Self-pay

## 2018-08-18 ENCOUNTER — Other Ambulatory Visit: Payer: Self-pay | Admitting: Family Medicine

## 2018-08-18 NOTE — Telephone Encounter (Signed)
Requesting refill    Tramadol   LOV: 10/13/17  LRF:  05/21/18

## 2018-08-18 NOTE — Telephone Encounter (Signed)
She needs F/U Dr. Dennard Schaumann , will refill

## 2018-08-23 ENCOUNTER — Other Ambulatory Visit: Payer: Self-pay | Admitting: Family Medicine

## 2018-08-23 DIAGNOSIS — I10 Essential (primary) hypertension: Secondary | ICD-10-CM

## 2018-08-23 NOTE — Telephone Encounter (Signed)
Pt aware via vm 

## 2018-08-26 ENCOUNTER — Encounter: Payer: Self-pay | Admitting: Family Medicine

## 2018-08-26 ENCOUNTER — Ambulatory Visit (INDEPENDENT_AMBULATORY_CARE_PROVIDER_SITE_OTHER): Payer: Medicare Other | Admitting: Family Medicine

## 2018-08-26 VITALS — BP 132/80 | HR 78 | Temp 98.2°F | Resp 16 | Ht 65.0 in | Wt 158.1 lb

## 2018-08-26 DIAGNOSIS — J0140 Acute pansinusitis, unspecified: Secondary | ICD-10-CM

## 2018-08-26 MED ORDER — AMOXICILLIN-POT CLAVULANATE 875-125 MG PO TABS: 1.0000 | ORAL_TABLET | Freq: Two times a day (BID) | ORAL | 0 refills | Status: DC

## 2018-08-26 MED ORDER — PREDNISONE 20 MG PO TABS: 40.0000 mg | ORAL_TABLET | Freq: Every day | ORAL | 0 refills | Status: DC

## 2018-08-26 NOTE — Patient Instructions (Signed)
Take antibiotics for the next 7 days  Add an antihistamine to your nasal spray to help with symptoms.  Take these daily until we get to winter.

## 2018-08-26 NOTE — Progress Notes (Signed)
Patient ID: Allison Freeman, female    DOB: [birth year], 72 y.o.   MRN: [id number]  PCP: Susy Frizzle, MD  Chief Complaint  Patient presents with  . Sinusitis    Patient n  . Sore Throat    Subjective:   Allison Freeman is a 72 y.o. female, presents to clinic with CC of sinus congestion/pain/pressure and HA's with sore scratchy throat and associated ear congestion and pain.  3 days of sx, got severe quickly with pain/pressure severe radinating to max sinuses around eyes, to teeth and HA everywhere.   Felt some h/c chills, did not take temp. Took sudafed w/o relief, but it made her jittery and she couldn't sleep It is "just starting to go to her chest" with coughing, but it is non-productive, not too bothersome currently, no SOB, CP, wheeze.    Patient Active Problem List   Diagnosis Date Noted  . Carpal tunnel syndrome of right wrist   . Osteoarthritis 01/21/2014  . Abdominal pain, epigastric 06/16/2013  . Rectal bleeding 06/16/2013  . Hemorrhoids 06/16/2013  . Other and unspecified hyperlipidemia 05/16/2013  . Allergic rhinitis 05/16/2013  . Esophageal reflux 04/15/2013  . Difficulty in walking(719.7) 12/20/2012  . Bilateral leg weakness 12/20/2012  . Hip bursitis 12/14/2012  . Back pain 12/14/2012  . Hip pain, bilateral 12/14/2012     Prior to Admission medications   Medication Sig Start Date End Date Taking? Authorizing Provider  ALPRAZolam Duanne Moron) 0.5 MG tablet TAKE ONE TABLET BY MOUTH EVERY NIGHT AT BEDTIME 06/24/18  Yes Susy Frizzle, MD  aspirin EC 81 MG tablet Take 81 mg by mouth daily.   Yes [provider]  fluticasone Asencion Islam) 50 MCG/ACT nasal spray Use 2 sprays in each  nostril once daily 05/30/15  Yes Luking, Grace Bushy, MD  hydroxypropyl methylcellulose / hypromellose (ISOPTO TEARS / GONIOVISC) 2.5 % ophthalmic solution Place 1 drop into both eyes 3 (three) times daily.   Yes [provider]  losartan (COZAAR) 100 MG tablet  TAKE ONE TABLET (100MG  TOTAL) BY MOUTH DAILY 08/23/18  Yes Susy Frizzle, MD  Magnesium 250 MG TABS Take by mouth.   Yes [provider]  montelukast (SINGULAIR) 10 MG tablet Take 1 tablet (10 mg total) by mouth at bedtime. 03/16/18  Yes Delsa Grana, PA-C  Multiple Vitamin (MULTIVITAMIN) tablet Take 1 tablet by mouth daily.   Yes [provider]  MYRBETRIQ 50 MG TB24 tablet Take 50 mg by mouth daily.  06/19/15  Yes [provider]  Omega-3 Fatty Acids (FISH OIL) 1200 MG CAPS Take 1 capsule by mouth daily.   Yes [provider]  pantoprazole (PROTONIX) 40 MG tablet TAKE ONE TABLET BY MOUTH TWICE DAILY BEFORE MEALS 07/26/18  Yes Susy Frizzle, MD  pravastatin (PRAVACHOL) 40 MG tablet Take 1 tablet (40 mg total) by mouth daily. 10/08/17  Yes Susy Frizzle, MD  ranitidine (ZANTAC) 300 MG tablet TAKE ONE TABLET BY MOUTH EVERY NIGHT AT BEDTIME 07/01/18  Yes Susy Frizzle, MD  rOPINIRole (REQUIP) 1 MG tablet TAKE ONE (1) TABLET BY MOUTH EVERY DAY 07/26/18  Yes Susy Frizzle, MD  traMADol (ULTRAM) 50 MG tablet TAKE ONE TABLET BY MOUTH THREE TIMES DAILY AS NEEDED 08/18/18  Yes [place], Modena Nunnery, MD  TURMERIC PO Take by mouth.   Yes [provider]     Allergies  Allergen Reactions  . Other     Arthritis medications- causes a bacteria in  the abdomen     Family History  Problem Relation Age of Onset  . Cancer Mother        Breast  . Hypertension Father   . Heart attack Father   . Cancer Sister        Breast  . Cancer Sister        breast  . Colon cancer Neg Hx      Social History   Socioeconomic History  . Marital status: Married    Spouse name: Not on file  . Number of children: Not on file  . Years of education: Not on file  . Highest education level: Not on file  Occupational History  . Not on file  Social Needs  . Financial resource strain: Not on file  . Food insecurity:    Worry: Not on file    Inability: Not on  file  . Transportation needs:    Medical: Not on file    Non-medical: Not on file  Tobacco Use  . Smoking status: Never Smoker  . Smokeless tobacco: Never Used  Substance and Sexual Activity  . Alcohol use: No  . Drug use: No  . Sexual activity: Yes    Birth control/protection: Surgical  Lifestyle  . Physical activity:    Days per week: Not on file    Minutes per session: Not on file  . Stress: Not on file  Relationships  . Social connections:    Talks on phone: Not on file    Gets together: Not on file    Attends religious service: Not on file    Active member of club or organization: Not on file    Attends meetings of clubs or organizations: Not on file    Relationship status: Not on file  . Intimate partner violence:    Fear of current or ex partner: Not on file    Emotionally abused: Not on file    Physically abused: Not on file    Forced sexual activity: Not on file  Other Topics Concern  . Not on file  Social History Narrative  . Not on file     Review of Systems  Constitutional: Negative.   HENT: Negative.   Eyes: Negative.   Respiratory: Negative.   Cardiovascular: Negative.   Gastrointestinal: Negative.   Endocrine: Negative.   Genitourinary: Negative.   Musculoskeletal: Negative.   Skin: Negative.   Allergic/Immunologic: Negative.   Neurological: Negative.   Hematological: Negative.   Psychiatric/Behavioral: Negative.   All other systems reviewed and are negative.      Objective:    Vitals:   08/26/18 1056  BP: 132/80  Pulse: 78  Resp: 16  Temp: 98.2 F (36.8 C)  TempSrc: Oral  SpO2: 96%  Weight: 158 lb 2 oz (71.7 kg)  Height: 5\' 5"  (1.651 m)      Physical Exam  Constitutional: She appears well-developed and well-nourished.  Non-toxic appearance. She does not have a sickly appearance. She does not appear ill. No distress.  HENT:  Head: Normocephalic and atraumatic.  Right Ear: Hearing, tympanic membrane, external ear and ear canal  normal.  Left Ear: Hearing, tympanic membrane, external ear and ear canal normal.  Nose: Mucosal edema, rhinorrhea and sinus tenderness present. Right sinus exhibits maxillary sinus tenderness and frontal sinus tenderness. Left sinus exhibits maxillary sinus tenderness and frontal sinus tenderness.  Mouth/Throat: Uvula is midline and mucous membranes are normal. Mucous membranes are not pale, not dry and not cyanotic. No uvula  swelling. Posterior oropharyngeal erythema (very mild injection) present. No oropharyngeal exudate, posterior oropharyngeal edema or tonsillar abscesses. No tonsillar exudate.  Eyes: Pupils are equal, round, and reactive to light. Conjunctivae are normal. Right eye exhibits no discharge. Left eye exhibits no discharge.  Neck: Normal range of motion. Neck supple. No tracheal deviation present.  Cardiovascular: Normal rate, regular rhythm, normal heart sounds and intact distal pulses. Exam reveals no gallop and no friction rub.  No murmur heard. Pulses:      Radial pulses are 2+ on the right side, and 2+ on the left side.  Pulmonary/Chest: Effort normal. No accessory muscle usage or stridor. No tachypnea. No respiratory distress. She has no decreased breath sounds. She has no wheezes. She has no rhonchi. She has no rales.  Intermittent coughing  Abdominal: Soft. Bowel sounds are normal. She exhibits no distension.  Musculoskeletal: Normal range of motion.  Neurological: She is alert. She exhibits normal muscle tone. Coordination normal.  Skin: Skin is warm and dry. No rash noted. She is not diaphoretic. No pallor.  Psychiatric: She has a normal mood and affect. Her behavior is normal.  Nursing note and vitals reviewed.         Assessment & Plan:      ICD-10-CM   1. Acute non-recurrent pansinusitis J01.40     Pt sx consistent with acute bacterial sinusitis - tx with augmentin. Suspect mild or early acute bronchitis as well Some mild coughing, non-productive.  Pt has  5 d prednisone burst to hold and can start with mucinex if she feels like she is developing any worsening chest congestion.  For sinuses - she does appear to have very swollen and nearly obstructing mucosa and nasal turbinates, she also has right PE tube with hx of recurrent AOM, suspect she has severe baseline allergies.  Instructed her to add daily 2nd generation OTC antihistamine and continue steroid nasal spray daily for the next month or so (true winter weather).  F/up as needed if not improving.   Delsa Grana, PA-C 08/26/18 11:09 AM

## 2018-08-27 ENCOUNTER — Encounter: Payer: Self-pay | Admitting: Family Medicine

## 2018-08-31 ENCOUNTER — Telehealth: Payer: Self-pay | Admitting: *Deleted

## 2018-08-31 ENCOUNTER — Ambulatory Visit (INDEPENDENT_AMBULATORY_CARE_PROVIDER_SITE_OTHER): Payer: Medicare Other | Admitting: Gastroenterology

## 2018-08-31 ENCOUNTER — Other Ambulatory Visit: Payer: Self-pay

## 2018-08-31 ENCOUNTER — Encounter: Payer: Self-pay | Admitting: Gastroenterology

## 2018-08-31 VITALS — BP 126/70 | HR 78 | Temp 97.2°F | Ht 65.0 in | Wt 159.0 lb

## 2018-08-31 DIAGNOSIS — K219 Gastro-esophageal reflux disease without esophagitis: Secondary | ICD-10-CM | POA: Diagnosis not present

## 2018-08-31 DIAGNOSIS — Z860100 Personal history of colon polyps, unspecified: Secondary | ICD-10-CM | POA: Insufficient documentation

## 2018-08-31 MED ORDER — NA SULFATE-K SULFATE-MG SULF 17.5-3.13-1.6 GM/177ML PO SOLN: 1.0000 | ORAL | 0 refills | Status: DC

## 2018-08-31 MED ORDER — FAMOTIDINE 40 MG PO TABS: 40.0000 mg | ORAL_TABLET | Freq: Every day | ORAL | 3 refills | Status: DC

## 2018-08-31 NOTE — Telephone Encounter (Signed)
Pre-op scheduled for 10/26/18 at 10:00am. Letter mailed. LMTCB

## 2018-08-31 NOTE — Patient Instructions (Signed)
1. Stop raniditidine. Start famotidine 40mg  at bedtime. If your reflux is well controlled, you can try weaning off the famotidine at any time.  2. Colonoscopy as scheduled. See separate instructions.

## 2018-08-31 NOTE — Progress Notes (Signed)
Primary Care Physician:  Susy Frizzle, MD  Primary Gastroenterologist:  Garfield Cornea, MD   Chief Complaint  Patient presents with  . Consult    TCS. last had done 2014    HPI:  Allison Freeman is a 72 y.o. female here to schedule surveillance colonoscopy. Last seen in 2014 at time of EGD/colonoscopy, see [facility] for details. Patient states she has required pantoprazole BID chronically to control her reflux. Six months ago, ranitidine was added for breakthrough heartburn which seems to be helping. She would like to come off of ranitidine due to concerns in the media.   Denies dysphagia. BM regular. No melena, brbpr. No abd pain.    Current Outpatient Medications  Medication Sig Dispense Refill  . ALPRAZolam (XANAX) 0.5 MG tablet TAKE ONE TABLET BY MOUTH EVERY NIGHT AT BEDTIME 30 tablet 2  . aspirin EC 81 MG tablet Take 81 mg by mouth daily.    . fluticasone (FLONASE) 50 MCG/ACT nasal spray Use 2 sprays in each  nostril once daily 48 g 3  . hydroxypropyl methylcellulose / hypromellose (ISOPTO TEARS / GONIOVISC) 2.5 % ophthalmic solution Place 1 drop into both eyes 3 (three) times daily.    Marland Kitchen losartan (COZAAR) 100 MG tablet TAKE ONE TABLET (100MG  TOTAL) BY MOUTH DAILY 90 tablet 3  . Magnesium 250 MG TABS Take by mouth.    . montelukast (SINGULAIR) 10 MG tablet Take 1 tablet (10 mg total) by mouth at bedtime. 30 tablet 3  . Multiple Vitamin (MULTIVITAMIN) tablet Take 1 tablet by mouth daily.    Marland Kitchen MYRBETRIQ 50 MG TB24 tablet Take 50 mg by mouth daily.     . Omega-3 Fatty Acids (FISH OIL) 1200 MG CAPS Take 1 capsule by mouth daily.    . pantoprazole (PROTONIX) 40 MG tablet TAKE ONE TABLET BY MOUTH TWICE DAILY BEFORE MEALS 180 tablet 2  . pravastatin (PRAVACHOL) 40 MG tablet Take 1 tablet (40 mg total) by mouth daily. 90 tablet 1  . ranitidine (ZANTAC) 300 MG tablet TAKE ONE TABLET BY MOUTH EVERY NIGHT AT BEDTIME 90 tablet 3  . rOPINIRole (REQUIP) 1 MG tablet TAKE ONE (1) TABLET BY MOUTH  EVERY DAY 90 tablet 1  . traMADol (ULTRAM) 50 MG tablet TAKE ONE TABLET BY MOUTH THREE TIMES DAILY AS NEEDED 270 tablet 0  . TURMERIC PO Take by mouth.     No current facility-administered medications for this visit.     Allergies as of 08/31/2018 - Review Complete 08/31/2018  Allergen Reaction Noted  . Other  01/19/2014    Past Medical History:  Diagnosis Date  . Acid reflux   . Arthritis   . Depression   . Hypercholesterolemia   . Hypertension   . Insomnia   . Osteopenia   . Panic attacks   . Reflux   . Restless leg syndrome     Past Surgical History:  Procedure Laterality Date  . ABDOMINAL HYSTERECTOMY  2001  . BLADDER SURGERY    . BREAST BIOPSY  1987  . BREAST CYST EXCISION  1998  . CARPAL TUNNEL RELEASE Right 04/06/2015   Procedure: CARPAL TUNNEL RELEASE;  Surgeon: Carole Civil, MD;  Location: [facility];  Service: Orthopedics;  Laterality: Right;  . COLONOSCOPY  09/19/2004   NOB:[id number] hemorrhoids, otherwise normal rectum/Sigmoid diverticula.  Remainder of colon mucosa appeared normal  . COLONOSCOPY WITH ESOPHAGOGASTRODUODENOSCOPY (EGD) N/A 06/24/2013   QHU:TMLYYTK polyp-removed. Small hiatal hernia. Abnormal gastric mucosa-query NSAID effect-s/p (chronic inactive gastritis, negative  H. pylori)/TCS: Prominent internal hemorrhoids-likely source of hematochezia. Colonic diverticulosis. Colonic polyp-removed (tubular adenoma)  . CYSTOCELE REPAIR  2010  . DILATION AND CURETTAGE OF UTERUS    . KNEE SURGERY Right 2003  . RECTAL SURGERY    . RECTOCELE REPAIR  2010  . TONSILLECTOMY      Family History  Problem Relation Age of Onset  . Cancer Mother        Breast  . Hypertension Father   . Heart attack Father   . Cancer Sister        Breast  . Cancer Sister        breast  . Breast cancer Sister   . Colon cancer Neg Hx     Social History   Socioeconomic History  . Marital status: Married    Spouse name: Not on file  . Number of children: Not on file  .  Years of education: Not on file  . Highest education level: Not on file  Occupational History  . Not on file  Social Needs  . Financial resource strain: Not on file  . Food insecurity:    Worry: Not on file    Inability: Not on file  . Transportation needs:    Medical: Not on file    Non-medical: Not on file  Tobacco Use  . Smoking status: Never Smoker  . Smokeless tobacco: Never Used  Substance and Sexual Activity  . Alcohol use: No  . Drug use: No  . Sexual activity: Yes    Birth control/protection: Surgical  Lifestyle  . Physical activity:    Days per week: Not on file    Minutes per session: Not on file  . Stress: Not on file  Relationships  . Social connections:    Talks on phone: Not on file    Gets together: Not on file    Attends religious service: Not on file    Active member of club or organization: Not on file    Attends meetings of clubs or organizations: Not on file    Relationship status: Not on file  . Intimate partner violence:    Fear of current or ex partner: Not on file    Emotionally abused: Not on file    Physically abused: Not on file    Forced sexual activity: Not on file  Other Topics Concern  . Not on file  Social History Narrative  . Not on file      ROS:  General: Negative for anorexia, weight loss, fever, chills, fatigue, weakness. Eyes: Negative for vision changes.  ENT: Negative for hoarseness, difficulty swallowing , nasal congestion. CV: Negative for chest pain, angina, palpitations, dyspnea on exertion, peripheral edema.  Respiratory: Negative for dyspnea at rest, dyspnea on exertion, cough, sputum, wheezing.  GI: See history of present illness. GU:  Negative for dysuria, hematuria, urinary incontinence, urinary frequency, nocturnal urination.  MS: chronic joint and muscle pain Derm: Negative for rash or itching.  Neuro: Negative for weakness, abnormal sensation, seizure, frequent headaches, memory loss, confusion.  Psych:  Negative for anxiety, depression, suicidal ideation, hallucinations.  Endo: Negative for unusual weight change.  Heme: Negative for bruising or bleeding. Allergy: Negative for rash or hives.    Physical Examination:  BP 126/70   Pulse 78   Temp (!) 97.2 F (36.2 C) (Oral)   Ht 5\' 5"  (1.651 m)   Wt 159 lb (72.1 kg)   BMI 26.46 kg/m    General: Well-nourished, well-developed in no  acute distress.  Head: Normocephalic, atraumatic.   Eyes: Conjunctiva pink, no icterus. Mouth: Oropharyngeal mucosa moist and pink , no lesions erythema or exudate. Neck: Supple without thyromegaly, masses, or lymphadenopathy.  Lungs: Clear to auscultation bilaterally.  Heart: Regular rate and rhythm, no murmurs rubs or gallops.  Abdomen: Bowel sounds are normal, nontender, nondistended, no hepatosplenomegaly or masses, no abdominal bruits or    hernia , no rebound or guarding.   Rectal: deferred Extremities: No lower extremity edema. No clubbing or deformities.  Neuro: Alert and oriented x 4 , grossly normal neurologically.  Skin: Warm and dry, no rash or jaundice.   Psych: Alert and cooperative, normal mood and affect.  Labs: Lab Results  Component Value Date   CREATININE 0.85 04/01/2018   BUN 19 04/01/2018   NA 140 04/01/2018   K 4.9 04/01/2018   CL 107 04/01/2018   CO2 28 04/01/2018   Lab Results  Component Value Date   ALT 16 04/01/2018   AST 19 04/01/2018   ALKPHOS 61 02/03/2017   BILITOT 0.9 04/01/2018   Lab Results  Component Value Date   WBC 4.9 04/01/2018   HGB 12.9 04/01/2018   HCT 39.0 04/01/2018   MCV 90.9 04/01/2018   PLT 229 04/01/2018     Imaging Studies: No results found.

## 2018-09-01 NOTE — Telephone Encounter (Signed)
Patient aware.

## 2018-09-02 NOTE — Progress Notes (Signed)
CC'D TO PCP °

## 2018-09-02 NOTE — Assessment & Plan Note (Signed)
Doing reasonably well but requiring PPI BID and H2 blocker in evening. She would like to come off Zantac. Will switch her to Pepcid 40mg  at bedtime. If continues to do well, would recommend weaning off H2 blocker. Reinforced antireflux measures.

## 2018-09-02 NOTE — Assessment & Plan Note (Signed)
Due for surveillance colonoscopy. Given polypharmacy, plan for deep sedation.  I have discussed the risks, alternatives, benefits with regards to but not limited to the risk of reaction to medication, bleeding, infection, perforation and the patient is agreeable to proceed. Written consent to be obtained.

## 2018-09-08 DIAGNOSIS — H04123 Dry eye syndrome of bilateral lacrimal glands: Secondary | ICD-10-CM | POA: Diagnosis not present

## 2018-09-08 DIAGNOSIS — H353131 Nonexudative age-related macular degeneration, bilateral, early dry stage: Secondary | ICD-10-CM | POA: Diagnosis not present

## 2018-09-08 DIAGNOSIS — H3530 Unspecified macular degeneration: Secondary | ICD-10-CM | POA: Diagnosis not present

## 2018-09-08 DIAGNOSIS — H1851 Endothelial corneal dystrophy: Secondary | ICD-10-CM | POA: Diagnosis not present

## 2018-09-23 ENCOUNTER — Ambulatory Visit (INDEPENDENT_AMBULATORY_CARE_PROVIDER_SITE_OTHER): Payer: Medicare Other | Admitting: Family Medicine

## 2018-09-23 ENCOUNTER — Encounter: Payer: Self-pay | Admitting: Family Medicine

## 2018-09-23 VITALS — BP 140/86 | HR 78 | Temp 98.4°F | Resp 14 | Ht 65.0 in | Wt 159.0 lb

## 2018-09-23 DIAGNOSIS — E7800 Pure hypercholesterolemia, unspecified: Secondary | ICD-10-CM | POA: Diagnosis not present

## 2018-09-23 DIAGNOSIS — M47816 Spondylosis without myelopathy or radiculopathy, lumbar region: Secondary | ICD-10-CM | POA: Diagnosis not present

## 2018-09-23 DIAGNOSIS — I10 Essential (primary) hypertension: Secondary | ICD-10-CM

## 2018-09-23 DIAGNOSIS — M8580 Other specified disorders of bone density and structure, unspecified site: Secondary | ICD-10-CM

## 2018-09-23 DIAGNOSIS — K219 Gastro-esophageal reflux disease without esophagitis: Secondary | ICD-10-CM

## 2018-09-23 DIAGNOSIS — Z86010 Personal history of colonic polyps: Secondary | ICD-10-CM

## 2018-09-23 DIAGNOSIS — Z0000 Encounter for general adult medical examination without abnormal findings: Secondary | ICD-10-CM

## 2018-09-23 MED ORDER — ROPINIROLE HCL 2 MG PO TABS: 2.0000 mg | ORAL_TABLET | Freq: Every day | ORAL | 3 refills | Status: DC

## 2018-09-23 NOTE — Progress Notes (Signed)
Subjective:    Patient ID: Allison Freeman, female    DOB: [birth year], 73 y.o.   MRN: [id number]  HPI  Patient is here today for a complete physical exam.  Mammogram was normal in 04/2018.  Her last colonoscopy was in 2014.  She is due for her next colonoscopy and this is already been scheduled for February of this year.  She denies any concerns about falls or depression.  She denies any concerns about dementia.  She is currently on Requip 1 mg p.o. nightly for restless leg.  However this is been worsening recently.  Symptoms usually start early in the evening and keep her awake at night.  She reports a discomfort in her legs that improves if she moves them.  Is causing her a difficult time sleeping.  She believes part of this may be due to the stress of caring for her mother who is requiring 24/7 care now.  Her last bone density test was in 2010 and revealed osteopenia.  She is due to repeat this.  Her shot record is included below. Immunization History  Administered Date(s) Administered  . Influenza,inj,Quad PF,6+ Mos 06/23/2014, 06/27/2015, 06/27/2016  . Influenza-Unspecified 06/30/2013, 07/14/2017, 07/06/2018  . Pneumococcal Conjugate-13 06/23/2014  . Pneumococcal Polysaccharide-23 01/28/2016  . Zoster Recombinat (Shingrix) 11/27/2017, 03/01/2018  Patient had labs in July: No visits with results within 1 Month(s) from this visit.  Latest known visit with results is:  Lab on 04/01/2018  Component Date Value Ref Range Status  . Cholesterol 04/01/2018 165  <200 mg/dL Final  . HDL 04/01/2018 54  >50 mg/dL Final  . Triglycerides 04/01/2018 83  <150 mg/dL Final  . LDL Cholesterol (Calc) 04/01/2018 94  mg/dL (calc) Final   Comment: Reference range: <100 . Desirable range <100 mg/dL for primary prevention;   <70 mg/dL for patients with CHD or diabetic patients  with > or = 2 CHD risk factors. Marland Kitchen LDL-C is now calculated using the Martin-Hopkins  calculation, which is a validated novel  method providing  better accuracy than the Friedewald equation in the  estimation of LDL-C.  Cresenciano Genre et al. Annamaria Helling. 1117;356(70): 2061-2068  (http://education.[facility].com/faq/FAQ164)   . Total CHOL/HDL Ratio 04/01/2018 3.1  <5.0 (calc) Final  . Non-HDL Cholesterol (Calc) 04/01/2018 111  <130 mg/dL (calc) Final   Comment: For patients with diabetes plus 1 major ASCVD risk  factor, treating to a non-HDL-C goal of <100 mg/dL  (LDL-C of <70 mg/dL) is considered a therapeutic  option.   . Glucose, Bld 04/01/2018 93  65 - 99 mg/dL Final   Comment: .            Fasting reference interval .   . BUN 04/01/2018 19  7 - 25 mg/dL Final  . Creat 04/01/2018 0.85  0.50 - 0.99 mg/dL Final   Comment: For patients >65 years of age, the reference limit for Creatinine is approximately 13% higher for people identified as African-American. .   Havery Moros Ratio 14/06/3012 NOT APPLICABLE  6 - 22 (calc) Final  . Sodium 04/01/2018 140  135 - 146 mmol/L Final  . Potassium 04/01/2018 4.9  3.5 - 5.3 mmol/L Final  . Chloride 04/01/2018 107  98 - 110 mmol/L Final  . CO2 04/01/2018 28  20 - 32 mmol/L Final  . Calcium 04/01/2018 9.1  8.6 - 10.4 mg/dL Final  . Total Protein 04/01/2018 6.6  6.1 - 8.1 g/dL Final  . Albumin 04/01/2018 3.8  3.6 - 5.1 g/dL Final  .  Globulin 04/01/2018 2.8  1.9 - 3.7 g/dL (calc) Final  . AG Ratio 04/01/2018 1.4  1.0 - 2.5 (calc) Final  . Total Bilirubin 04/01/2018 0.9  0.2 - 1.2 mg/dL Final  . Alkaline phosphatase (APISO) 04/01/2018 58  33 - 130 U/L Final  . AST 04/01/2018 19  10 - 35 U/L Final  . ALT 04/01/2018 16  6 - 29 U/L Final  . WBC 04/01/2018 4.9  3.8 - 10.8 Thousand/uL Final  . RBC 04/01/2018 4.29  3.80 - 5.10 Million/uL Final  . Hemoglobin 04/01/2018 12.9  11.7 - 15.5 g/dL Final  . HCT 04/01/2018 39.0  35.0 - 45.0 % Final  . MCV 04/01/2018 90.9  80.0 - 100.0 fL Final  . MCH 04/01/2018 30.1  27.0 - 33.0 pg Final  . MCHC 04/01/2018 33.1  32.0 - 36.0 g/dL  Final  . RDW 04/01/2018 12.3  11.0 - 15.0 % Final  . Platelets 04/01/2018 229  140 - 400 Thousand/uL Final  . MPV 04/01/2018 10.7  7.5 - 12.5 fL Final  . Neutro Abs 04/01/2018 2,818  1,500 - 7,800 cells/uL Final  . Lymphs Abs 04/01/2018 1,480  850 - 3,900 cells/uL Final  . WBC mixed population 04/01/2018 475  200 - 950 cells/uL Final  . Eosinophils Absolute 04/01/2018 98  15 - 500 cells/uL Final  . Basophils Absolute 04/01/2018 29  0 - 200 cells/uL Final  . Neutrophils Relative % 04/01/2018 57.5  % Final  . Total Lymphocyte 04/01/2018 30.2  % Final  . Monocytes Relative 04/01/2018 9.7  % Final  . Eosinophils Relative 04/01/2018 2.0  % Final  . Basophils Relative 04/01/2018 0.6  % Final   Past Medical History:  Diagnosis Date  . Acid reflux   . Arthritis   . Depression   . Hypercholesterolemia   . Hypertension   . Insomnia   . Osteopenia   . Panic attacks   . Reflux   . Restless leg syndrome    Past Surgical History:  Procedure Laterality Date  . ABDOMINAL HYSTERECTOMY  2001  . BLADDER SURGERY    . BREAST BIOPSY  1987  . BREAST CYST EXCISION  1998  . CARPAL TUNNEL RELEASE Right 04/06/2015   Procedure: CARPAL TUNNEL RELEASE;  Surgeon: Carole Civil, MD;  Location: [facility];  Service: Orthopedics;  Laterality: Right;  . COLONOSCOPY  09/19/2004   RKY:HCWCBJSE hemorrhoids, otherwise normal rectum/Sigmoid diverticula.  Remainder of colon mucosa appeared normal  . COLONOSCOPY WITH ESOPHAGOGASTRODUODENOSCOPY (EGD) N/A 06/24/2013   GBT:DVVOHYW polyp-removed. Small hiatal hernia. Abnormal gastric mucosa-query NSAID effect-s/p (chronic inactive gastritis, negative H. pylori)/TCS: Prominent internal hemorrhoids-likely source of hematochezia. Colonic diverticulosis. Colonic polyp-removed (tubular adenoma)  . CYSTOCELE REPAIR  2010  . DILATION AND CURETTAGE OF UTERUS    . KNEE SURGERY Right 2003  . RECTAL SURGERY    . RECTOCELE REPAIR  2010  . TONSILLECTOMY     Current Outpatient  Medications on File Prior to Visit  Medication Sig Dispense Refill  . ALPRAZolam (XANAX) 0.5 MG tablet TAKE ONE TABLET BY MOUTH EVERY NIGHT AT BEDTIME 30 tablet 2  . aspirin EC 81 MG tablet Take 81 mg by mouth daily.    . famotidine (PEPCID) 40 MG tablet Take 1 tablet (40 mg total) by mouth at bedtime. 90 tablet 3  . fluticasone (FLONASE) 50 MCG/ACT nasal spray Use 2 sprays in each  nostril once daily 48 g 3  . hydroxypropyl methylcellulose / hypromellose (ISOPTO TEARS / GONIOVISC) 2.5 %  ophthalmic solution Place 1 drop into both eyes 3 (three) times daily.    Marland Kitchen losartan (COZAAR) 100 MG tablet TAKE ONE TABLET (100MG TOTAL) BY MOUTH DAILY 90 tablet 3  . Magnesium 250 MG TABS Take by mouth.    . montelukast (SINGULAIR) 10 MG tablet Take 1 tablet (10 mg total) by mouth at bedtime. 30 tablet 3  . Multiple Vitamin (MULTIVITAMIN) tablet Take 1 tablet by mouth daily.    Marland Kitchen MYRBETRIQ 50 MG TB24 tablet Take 50 mg by mouth daily.     . Na Sulfate-K Sulfate-Mg Sulf (SUPREP BOWEL PREP KIT) 17.5-3.13-1.6 GM/177ML SOLN Take 1 kit by mouth as directed. 1 Bottle 0  . Omega-3 Fatty Acids (FISH OIL) 1200 MG CAPS Take 1 capsule by mouth daily.    . pantoprazole (PROTONIX) 40 MG tablet TAKE ONE TABLET BY MOUTH TWICE DAILY BEFORE MEALS 180 tablet 2  . pravastatin (PRAVACHOL) 40 MG tablet Take 1 tablet (40 mg total) by mouth daily. 90 tablet 1  . rOPINIRole (REQUIP) 1 MG tablet TAKE ONE (1) TABLET BY MOUTH EVERY DAY 90 tablet 1  . traMADol (ULTRAM) 50 MG tablet TAKE ONE TABLET BY MOUTH THREE TIMES DAILY AS NEEDED 270 tablet 0  . TURMERIC PO Take by mouth.     No current facility-administered medications on file prior to visit.    Allergies  Allergen Reactions  . Other     Arthritis medications- causes a bacteria in the abdomen   Social History   Socioeconomic History  . Marital status: Married    Spouse name: Not on file  . Number of children: Not on file  . Years of education: Not on file  . Highest  education level: Not on file  Occupational History  . Not on file  Social Needs  . Financial resource strain: Not on file  . Food insecurity:    Worry: Not on file    Inability: Not on file  . Transportation needs:    Medical: Not on file    Non-medical: Not on file  Tobacco Use  . Smoking status: Never Smoker  . Smokeless tobacco: Never Used  Substance and Sexual Activity  . Alcohol use: No  . Drug use: No  . Sexual activity: Yes    Birth control/protection: Surgical  Lifestyle  . Physical activity:    Days per week: Not on file    Minutes per session: Not on file  . Stress: Not on file  Relationships  . Social connections:    Talks on phone: Not on file    Gets together: Not on file    Attends religious service: Not on file    Active member of club or organization: Not on file    Attends meetings of clubs or organizations: Not on file    Relationship status: Not on file  . Intimate partner violence:    Fear of current or ex partner: Not on file    Emotionally abused: Not on file    Physically abused: Not on file    Forced sexual activity: Not on file  Other Topics Concern  . Not on file  Social History Narrative  . Not on file   Colonoscopy was in 2014 and is up-to-date.  Mammogram was checked in August and is up-to-date.  Patient has a history of a hysterectomy and therefore does not require a Pap smear.  Review of Systems  All other systems reviewed and are negative.      Objective:  Physical Exam  Constitutional: She is oriented to person, place, and time. She appears well-developed and well-nourished. No distress.  HENT:  Head: Normocephalic and atraumatic.  Right Ear: External ear normal.  Left Ear: External ear normal.  Nose: Nose normal.  Mouth/Throat: Oropharynx is clear and moist. No oropharyngeal exudate.  Eyes: Pupils are equal, round, and reactive to light. Conjunctivae and EOM are normal. Right eye exhibits no discharge. Left eye exhibits no  discharge. No scleral icterus.  Neck: Normal range of motion. Neck supple. No JVD present. No tracheal deviation present. No thyromegaly present.  Cardiovascular: Normal rate, regular rhythm, normal heart sounds and intact distal pulses. Exam reveals no gallop and no friction rub.  No murmur heard. Pulmonary/Chest: Effort normal and breath sounds normal. No stridor. No respiratory distress. She has no wheezes. She has no rales. She exhibits no tenderness.  Abdominal: Soft. Bowel sounds are normal. She exhibits no distension and no mass. There is no abdominal tenderness. There is no rebound and no guarding.  Musculoskeletal: Normal range of motion.        General: No tenderness, deformity or edema.  Lymphadenopathy:    She has no cervical adenopathy.  Neurological: She is alert and oriented to person, place, and time. She has normal reflexes. No cranial nerve deficit. She exhibits normal muscle tone. Coordination normal.  Skin: Skin is warm. No rash noted. She is not diaphoretic. No erythema. No pallor.  Psychiatric: She has a normal mood and affect. Her behavior is normal. Judgment and thought content normal.  Vitals reviewed.         Assessment & Plan:  General medical exam  Benign essential HTN  Pure hypercholesterolemia  History of colonic polyps  Facet arthritis of lumbar region  Gastroesophageal reflux disease without esophagitis  Mammogram is up-to-date.  Lab work from July was outstanding and I see no reason to repeat that at this time.  Bone density test is due to be repeated and I will schedule this for her osteopenia.  She is taking calcium and vitamin D.  Colonoscopy is due and this is already been scheduled for February.  She does not require Pap smear due to age.  Immunizations are up-to-date.  We will increase Requip to 2 mg p.o. nightly and then reassess the patient in 2 to 3 weeks.

## 2018-09-27 ENCOUNTER — Other Ambulatory Visit: Payer: Self-pay | Admitting: Family Medicine

## 2018-09-27 NOTE — Telephone Encounter (Signed)
Pt is requesting refill on Xanax   LOV: 09/23/18  LRF:   06/24/18

## 2018-10-21 NOTE — Patient Instructions (Signed)
   Your procedure is scheduled on: 11/01/2018  Report to [facility] at  6:15   AM.  Call this number if you have problems the morning of surgery: [phone]   Remember:              Follow Directions on the letter you received from Your Physician's office regarding the Bowel Prep  :  Take these medicines the morning of surgery with A SIP OF WATER: Losartan, Protonix, Tramadol, Pepcid, and Zyrtec   Do not wear jewelry, make-up or nail polish.    Do not bring valuables to the hospital.  Contacts, dentures or bridgework may not be worn into surgery.  .   Patients discharged the day of surgery will not be allowed to drive home.     Colonoscopy, Adult, Care After This sheet gives you information about how to care for yourself after your procedure. Your health care provider may also give you more specific instructions. If you have problems or questions, contact your health care provider. What can I expect after the procedure? After the procedure, it is common to have:  A small amount of blood in your stool for 24 hours after the procedure.  Some gas.  Mild abdominal cramping or bloating.  Follow these instructions at home: General instructions   For the first 24 hours after the procedure: ? Do not drive or use machinery. ? Do not sign important documents. ? Do not drink alcohol. ? Do your regular daily activities at a slower pace than normal. ? Eat soft, easy-to-digest foods. ? Rest often.  Take over-the-counter or prescription medicines only as told by your health care provider.  It is up to you to get the results of your procedure. Ask your health care provider, or the department performing the procedure, when your results will be ready. Relieving cramping and bloating  Try walking around when you have cramps or feel bloated.  Apply heat to your abdomen as told by your health care provider. Use a heat source that your health care provider recommends, such as a moist heat  pack or a heating pad. ? Place a towel between your skin and the heat source. ? Leave the heat on for 20-30 minutes. ? Remove the heat if your skin turns bright red. This is especially important if you are unable to feel pain, heat, or cold. You may have a greater risk of getting burned. Eating and drinking  Drink enough fluid to keep your urine clear or pale yellow.  Resume your normal diet as instructed by your health care provider. Avoid heavy or fried foods that are hard to digest.  Avoid drinking alcohol for as long as instructed by your health care provider. Contact a health care provider if:  You have blood in your stool 2-3 days after the procedure. Get help right away if:  You have more than a small spotting of blood in your stool.  You pass large blood clots in your stool.  Your abdomen is swollen.  You have nausea or vomiting.  You have a fever.  You have increasing abdominal pain that is not relieved with medicine. This information is not intended to replace advice given to you by your health care provider. Make sure you discuss any questions you have with your health care provider. Document Released: 04/22/2004 Document Revised: 06/02/2016 Document Reviewed: 11/20/2015 Elsevier Interactive Patient Education  [employer].

## 2018-10-26 ENCOUNTER — Encounter (HOSPITAL_COMMUNITY): Payer: Self-pay

## 2018-10-26 ENCOUNTER — Encounter (HOSPITAL_COMMUNITY)
Admission: RE | Admit: 2018-10-26 | Discharge: 2018-10-26 | Disposition: A | Discharge status: Home / Self Care | Payer: Medicare Other | Source: Ambulatory Visit | Attending: Internal Medicine | Admitting: Internal Medicine

## 2018-10-26 DIAGNOSIS — Z01818 Encounter for other preprocedural examination: Secondary | ICD-10-CM | POA: Diagnosis not present

## 2018-10-26 LAB — CBC WITH DIFFERENTIAL/PLATELET
Abs Immature Granulocytes: 0.01 10*3/uL (ref 0.00–0.07)
BASOS ABS: 0 10*3/uL (ref 0.0–0.1)
Basophils Relative: 1 %
EOS ABS: 0.1 10*3/uL (ref 0.0–0.5)
EOS PCT: 2 %
HEMATOCRIT: 39.3 % (ref 36.0–46.0)
HEMOGLOBIN: 13.1 g/dL (ref 12.0–15.0)
Immature Granulocytes: 0 %
LYMPHS ABS: 1.4 10*3/uL (ref 0.7–4.0)
LYMPHS PCT: 30 %
MCH: 29.5 pg (ref 26.0–34.0)
MCHC: 33.3 g/dL (ref 30.0–36.0)
MCV: 88.5 fL (ref 80.0–100.0)
MONO ABS: 0.4 10*3/uL (ref 0.1–1.0)
MONOS PCT: 8 %
NRBC: 0 % (ref 0.0–0.2)
Neutro Abs: 2.7 10*3/uL (ref 1.7–7.7)
Neutrophils Relative %: 59 %
Platelets: 248 10*3/uL (ref 150–400)
RBC: 4.44 MIL/uL (ref 3.87–5.11)
RDW: 12.9 % (ref 11.5–15.5)
WBC: 4.6 10*3/uL (ref 4.0–10.5)

## 2018-10-26 LAB — BASIC METABOLIC PANEL
Anion gap: 8 (ref 5–15)
BUN: 22 mg/dL (ref 8–23)
CALCIUM: 8.9 mg/dL (ref 8.9–10.3)
CO2: 23 mmol/L (ref 22–32)
CREATININE: 0.97 mg/dL (ref 0.44–1.00)
Chloride: 109 mmol/L (ref 98–111)
GFR, EST NON AFRICAN AMERICAN: 59 mL/min — AB (ref >60)
GLUCOSE: 100 mg/dL — AB (ref 70–99)
Potassium: 3.8 mmol/L (ref 3.5–5.1)
Sodium: 140 mmol/L (ref 135–145)

## 2018-10-28 ENCOUNTER — Other Ambulatory Visit: Payer: Self-pay | Admitting: Family Medicine

## 2018-10-28 NOTE — Telephone Encounter (Signed)
Ok to refill??  Last office visit 09/23/2018.  Last refill 09/27/2018.

## 2018-11-01 ENCOUNTER — Encounter (HOSPITAL_COMMUNITY): Payer: Self-pay | Admitting: *Deleted

## 2018-11-01 ENCOUNTER — Encounter (HOSPITAL_COMMUNITY)
Admission: RE | Disposition: A | Discharge status: Home / Self Care | Payer: Self-pay | Source: Home / Self Care | Attending: Internal Medicine

## 2018-11-01 ENCOUNTER — Ambulatory Visit (HOSPITAL_COMMUNITY): Payer: Medicare Other | Admitting: Anesthesiology

## 2018-11-01 ENCOUNTER — Ambulatory Visit (HOSPITAL_COMMUNITY)
Admission: RE | Admit: 2018-11-01 | Discharge: 2018-11-01 | Disposition: A | Discharge status: Home / Self Care | Payer: Medicare Other | Attending: Internal Medicine | Admitting: Internal Medicine

## 2018-11-01 DIAGNOSIS — K640 First degree hemorrhoids: Secondary | ICD-10-CM | POA: Diagnosis not present

## 2018-11-01 DIAGNOSIS — Z86010 Personal history of colonic polyps: Secondary | ICD-10-CM | POA: Diagnosis not present

## 2018-11-01 DIAGNOSIS — G2581 Restless legs syndrome: Secondary | ICD-10-CM | POA: Diagnosis not present

## 2018-11-01 DIAGNOSIS — Z1211 Encounter for screening for malignant neoplasm of colon: Secondary | ICD-10-CM | POA: Insufficient documentation

## 2018-11-01 DIAGNOSIS — K219 Gastro-esophageal reflux disease without esophagitis: Secondary | ICD-10-CM | POA: Diagnosis not present

## 2018-11-01 DIAGNOSIS — E7800 Pure hypercholesterolemia, unspecified: Secondary | ICD-10-CM | POA: Insufficient documentation

## 2018-11-01 DIAGNOSIS — Z79899 Other long term (current) drug therapy: Secondary | ICD-10-CM | POA: Diagnosis not present

## 2018-11-01 DIAGNOSIS — K5730 Diverticulosis of large intestine without perforation or abscess without bleeding: Secondary | ICD-10-CM | POA: Insufficient documentation

## 2018-11-01 DIAGNOSIS — I10 Essential (primary) hypertension: Secondary | ICD-10-CM | POA: Insufficient documentation

## 2018-11-01 HISTORY — PX: COLONOSCOPY WITH PROPOFOL: SHX5780

## 2018-11-01 SURGERY — COLONOSCOPY WITH PROPOFOL
Anesthesia: Monitor Anesthesia Care

## 2018-11-01 MED ORDER — CHLORHEXIDINE GLUCONATE CLOTH 2 % EX PADS: 6.0000 | MEDICATED_PAD | Freq: Once | CUTANEOUS | Status: DC

## 2018-11-01 MED ORDER — PROPOFOL 500 MG/50ML IV EMUL
INTRAVENOUS | Status: DC | PRN
  Administered 2018-11-01: 08:00:00 via INTRAVENOUS
  Administered 2018-11-01: 125 ug/kg/min via INTRAVENOUS

## 2018-11-01 MED ORDER — LACTATED RINGERS IV SOLN: INTRAVENOUS | Status: DC

## 2018-11-01 MED ORDER — HYDROCODONE-ACETAMINOPHEN 7.5-325 MG PO TABS: 1.0000 | ORAL_TABLET | Freq: Once | ORAL | Status: DC | PRN

## 2018-11-01 MED ORDER — MEPERIDINE HCL 100 MG/ML IJ SOLN: 6.2500 mg | INTRAMUSCULAR | Status: DC | PRN

## 2018-11-01 MED ORDER — LACTATED RINGERS IV SOLN
INTRAVENOUS | Status: DC | PRN
  Administered 2018-11-01: 07:00:00 via INTRAVENOUS
  Administered 2018-11-01: 1000 mL

## 2018-11-01 MED ORDER — PROMETHAZINE HCL 25 MG/ML IJ SOLN: 6.2500 mg | INTRAMUSCULAR | Status: DC | PRN

## 2018-11-01 MED ORDER — PROPOFOL 10 MG/ML IV BOLUS
INTRAVENOUS | Status: AC
  Filled 2018-11-01: qty 60

## 2018-11-01 MED ORDER — PROPOFOL 10 MG/ML IV BOLUS
INTRAVENOUS | Status: DC | PRN
  Administered 2018-11-01: 30 mg via INTRAVENOUS
  Administered 2018-11-01: 15 mg via INTRAVENOUS

## 2018-11-01 MED ORDER — HYDROMORPHONE HCL 1 MG/ML IJ SOLN: 0.2500 mg | INTRAMUSCULAR | Status: DC | PRN

## 2018-11-01 MED ORDER — PROPOFOL 10 MG/ML IV BOLUS
INTRAVENOUS | Status: AC
  Filled 2018-11-01: qty 20

## 2018-11-01 MED ORDER — KETAMINE HCL 50 MG/5ML IJ SOSY
PREFILLED_SYRINGE | INTRAMUSCULAR | Status: AC
  Filled 2018-11-01: qty 5

## 2018-11-01 NOTE — Transfer of Care (Signed)
Immediate Anesthesia Transfer of Care Note  Patient: Allison Freeman  Procedure(s) Performed: COLONOSCOPY WITH PROPOFOL (N/A )  Patient Location: PACU  Anesthesia Type:MAC  Level of Consciousness: awake and patient cooperative  Airway & Oxygen Therapy: Patient Spontanous Breathing  Post-op Assessment: Report given to RN, Post -op Vital signs reviewed and stable and Patient moving all extremities  Post vital signs: Reviewed and stable  Last Vitals:  Vitals Value Taken Time  BP    Temp    Pulse 77 11/01/2018  8:09 AM  Resp 15 11/01/2018  8:09 AM  SpO2 98 % 11/01/2018  8:09 AM  Vitals shown include unvalidated device data.  Last Pain:  Vitals:   11/01/18 0741  TempSrc:   PainSc: 0-No pain      Patients Stated Pain Goal: 7 (46/28/63 8177)  Complications: No apparent anesthesia complications

## 2018-11-01 NOTE — Discharge Instructions (Signed)
°Colonoscopy °Discharge Instructions ° °Read the instructions outlined below and refer to this sheet in the next few weeks. These discharge instructions provide you with general information on caring for yourself after you leave the hospital. Your doctor may also give you specific instructions. While your treatment has been planned according to the most current medical practices available, unavoidable complications occasionally occur. If you have any problems or questions after discharge, call Dr. Rourk at [phone]. °ACTIVITY °· You may resume your regular activity, but move at a slower pace for the next 24 hours.  °· Take frequent rest periods for the next 24 hours.  °· Walking will help get rid of the air and reduce the bloated feeling in your belly (abdomen).  °· No driving for 24 hours (because of the medicine (anesthesia) used during the test).   °· Do not sign any important legal documents or operate any machinery for 24 hours (because of the anesthesia used during the test).  °NUTRITION °· Drink plenty of fluids.  °· You may resume your normal diet as instructed by your doctor.  °· Begin with a light meal and progress to your normal diet. Heavy or fried foods are harder to digest and may make you feel sick to your stomach (nauseated).  °· Avoid alcoholic beverages for 24 hours or as instructed.  °MEDICATIONS °· You may resume your normal medications unless your doctor tells you otherwise.  °WHAT YOU CAN EXPECT TODAY °· Some feelings of bloating in the abdomen.  °· Passage of more gas than usual.  °· Spotting of blood in your stool or on the toilet paper.  °IF YOU HAD POLYPS REMOVED DURING THE COLONOSCOPY: °· No aspirin products for 7 days or as instructed.  °· No alcohol for 7 days or as instructed.  °· Eat a soft diet for the next 24 hours.  °FINDING OUT THE RESULTS OF YOUR TEST °Not all test results are available during your visit. If your test results are not back during the visit, make an appointment  with your caregiver to find out the results. Do not assume everything is normal if you have not heard from your caregiver or the medical facility. It is important for you to follow up on all of your test results.  °SEEK IMMEDIATE MEDICAL ATTENTION IF: °· You have more than a spotting of blood in your stool.  °· Your belly is swollen (abdominal distention).  °· You are nauseated or vomiting.  °· You have a temperature over 101.  °· You have abdominal pain or discomfort that is severe or gets worse throughout the day.  ° ° °Diverticulosis information provided ° °I do not recommend a future colonoscopy unless new symptoms develop ° ° °Diverticulosis ° °Diverticulosis is a condition that develops when small pouches (diverticula) form in the wall of the large intestine (colon). The colon is where water is absorbed and stool is formed. The pouches form when the inside layer of the colon pushes through weak spots in the outer layers of the colon. You may have a few pouches or many of them. °What are the causes? °The cause of this condition is not known. °What increases the risk? °The following factors may make you more likely to develop this condition: °· Being older than age 60. Your risk for this condition increases with age. Diverticulosis is rare among people younger than age 30. By age 80, many people have it. °· Eating a low-fiber diet. °· Having frequent constipation. °· Being overweight. °·   Not getting enough exercise. °· Smoking. °· Taking over-the-counter pain medicines, like aspirin and ibuprofen. °· Having a family history of diverticulosis. °What are the signs or symptoms? °In most people, there are no symptoms of this condition. If you do have symptoms, they may include: °· Bloating. °· Cramps in the abdomen. °· Constipation or diarrhea. °· Pain in the lower left side of the abdomen. °How is this diagnosed? °This condition is most often diagnosed during an exam for other colon problems. Because diverticulosis  usually has no symptoms, it often cannot be diagnosed independently. This condition may be diagnosed by: °· Using a flexible scope to examine the colon (colonoscopy). °· Taking an X-ray of the colon after dye has been put into the colon (barium enema). °· Doing a CT scan. °How is this treated? °You may not need treatment for this condition if you have never developed an infection related to diverticulosis. If you have had an infection before, treatment may include: °· Eating a high-fiber diet. This may include eating more fruits, vegetables, and grains. °· Taking a fiber supplement. °· Taking a live bacteria supplement (probiotic). °· Taking medicine to relax your colon. °· Taking antibiotic medicines. °Follow these instructions at home: °· Drink 6-8 glasses of water or more each day to prevent constipation. °· Try not to strain when you have a bowel movement. °· If you have had an infection before: °? Eat more fiber as directed by your health care provider or your diet and nutrition specialist (dietitian). °? Take a fiber supplement or probiotic, if your health care provider approves. °· Take over-the-counter and prescription medicines only as told by your health care provider. °· If you were prescribed an antibiotic, take it as told by your health care provider. Do not stop taking the antibiotic even if you start to feel better. °· Keep all follow-up visits as told by your health care provider. This is important. °Contact a health care provider if: °· You have pain in your abdomen. °· You have bloating. °· You have cramps. °· You have not had a bowel movement in 3 days. °Get help right away if: °· Your pain gets worse. °· Your bloating becomes very bad. °· You have a fever or chills, and your symptoms suddenly get worse. °· You vomit. °· You have bowel movements that are bloody or black. °· You have bleeding from your rectum. °Summary °· Diverticulosis is a condition that develops when small pouches (diverticula)  form in the wall of the large intestine (colon). °· You may have a few pouches or many of them. °· This condition is most often diagnosed during an exam for other colon problems. °· If you have had an infection related to diverticulosis, treatment may include increasing the fiber in your diet, taking supplements, or taking medicines. °This information is not intended to replace advice given to you by your health care provider. Make sure you discuss any questions you have with your health care provider. °Document Released: 06/05/2004 Document Revised: 07/28/2016 Document Reviewed: 07/28/2016 °Elsevier Interactive Patient Education © 2019 [employer] ° ° °

## 2018-11-01 NOTE — Op Note (Signed)
[facility] Patient Name: Allison Freeman Procedure Date: 11/01/2018 7:39 AM MRN: [id number] Date of Birth: Jul 11, 1946 Attending MD: [name] , MD CSN: [id number] Age: 73 Admit Type: Outpatient Procedure:                Colonoscopy Indications:              High risk colon cancer surveillance: Personal                            history of colonic polyps Providers:                [name], MD, Otis Peak B. Sharon Seller, RN,                            Nelma Rothman, Technician Referring MD:             Cammie Mcgee. Pickard Medicines:                Propofol per Anesthesia Complications:            No immediate complications. Estimated Blood Loss:     Estimated blood loss: none. Procedure:                Pre-Anesthesia Assessment:                           - Prior to the procedure, a History and Physical                            was performed, and patient medications and                            allergies were reviewed. The patient's tolerance of                            previous anesthesia was also reviewed. The risks                            and benefits of the procedure and the sedation                            options and risks were discussed with the patient.                            All questions were answered, and informed consent                            was obtained. Prior Anticoagulants: The patient has                            taken no previous anticoagulant or antiplatelet                            agents. ASA Grade Assessment: II - A patient with  mild systemic disease. After reviewing the risks                            and benefits, the patient was deemed in                            satisfactory condition to undergo the procedure.                           After obtaining informed consent, the colonoscope                            was passed under direct vision. Throughout the   procedure, the patient's blood pressure, pulse, and                            oxygen saturations were monitored continuously. The                            CF-HQ190L ([id number]) scope was introduced through                            the and advanced to the the cecum, identified by                            appendiceal orifice and ileocecal valve. The                            colonoscopy was performed without difficulty. The                            patient tolerated the procedure well. The quality                            of the bowel preparation was adequate. The                            ileocecal valve, appendiceal orifice, and rectum                            were photographed. The entire colon was well                            visualized. Scope In: 7:48:05 AM Scope Out: 8:02:13 AM Scope Withdrawal Time: 0 hours 6 minutes 51 seconds  Total Procedure Duration: 0 hours 14 minutes 8 seconds  Findings:      The perianal and digital rectal examinations were normal.      Many small and large-mouthed diverticula were found in the sigmoid colon       and descending colon.      Internal hemorrhoids were found during retroflexion. The hemorrhoids       were moderate, medium-sized and Grade I (internal hemorrhoids that do       not prolapse).      The exam was otherwise without abnormality on direct and retroflexion  views. Impression:               - Diverticulosis in the sigmoid colon and in the                            descending colon.                           - Internal hemorrhoids.                           - The examination was otherwise normal on direct                            and retroflexion views.                           - No specimens collected. Moderate Sedation:      Moderate (conscious) sedation was personally administered by an       anesthesia professional. The following parameters were monitored: oxygen       saturation, heart rate, blood pressure,  respiratory rate, EKG, adequacy       of pulmonary ventilation, and response to care. Recommendation:           - Patient has a contact number available for                            emergencies. The signs and symptoms of potential                            delayed complications were discussed with the                            patient. Return to normal activities tomorrow.                            Written discharge instructions were provided to the                            patient.                           - Resume previous diet.                           - Continue present medications.                           - No repeat colonoscopy due to the absence of                            advanced adenomas.                           - Return to GI clinic (date not yet determined). Procedure Code(s):        --- Professional ---  45378, Colonoscopy, flexible; diagnostic, including                            collection of specimen(s) by brushing or washing,                            when performed (separate procedure) Diagnosis Code(s):        --- Professional ---                           Z86.010, Personal history of colonic polyps                           K64.0, First degree hemorrhoids                           K57.30, Diverticulosis of large intestine without                            perforation or abscess without bleeding CPT copyright 2018 American Medical Association. All rights reserved. The codes documented in this report are preliminary and upon coder review may  be revised to meet current compliance requirements. [name]. [name], MD [name], MD 11/01/2018 8:10:19 AM This report has been signed electronically. Number of Addenda: 0

## 2018-11-01 NOTE — Anesthesia Preprocedure Evaluation (Signed)
Anesthesia Evaluation    Airway Mallampati: II       Dental  (+) Dental Advidsory Given, Teeth Intact   Pulmonary    breath sounds clear to auscultation       Cardiovascular hypertension, On Medications  Rhythm:regular     Neuro/Psych  Neuromuscular disease    GI/Hepatic GERD  ,  Endo/Other    Renal/GU      Musculoskeletal   Abdominal   Peds  Hematology   Anesthesia Other Findings NSR at 87  Reproductive/Obstetrics                             Anesthesia Physical Anesthesia Plan  ASA: II  Anesthesia Plan: MAC   Post-op Pain Management:    Induction:   PONV Risk Score and Plan:   Airway Management Planned:   Additional Equipment:   Intra-op Plan:   Post-operative Plan:   Informed Consent: I have reviewed the patients History and Physical, chart, labs and discussed the procedure including the risks, benefits and alternatives for the proposed anesthesia with the patient or authorized representative who has indicated his/her understanding and acceptance.       Plan Discussed with: Anesthesiologist  Anesthesia Plan Comments:         Anesthesia Quick Evaluation

## 2018-11-01 NOTE — Anesthesia Postprocedure Evaluation (Signed)
Anesthesia Post Note  Patient: Allison Freeman  Procedure(s) Performed: COLONOSCOPY WITH PROPOFOL (N/A )  Patient location during evaluation: PACU Anesthesia Type: MAC Level of consciousness: awake and patient cooperative Pain management: pain level controlled Vital Signs Assessment: post-procedure vital signs reviewed and stable Respiratory status: spontaneous breathing, nonlabored ventilation and respiratory function stable Cardiovascular status: blood pressure returned to baseline Postop Assessment: no apparent nausea or vomiting Anesthetic complications: no     Last Vitals:  Vitals:   11/01/18 0648  BP: 133/76  Resp: 17  Temp: 36.5 C  SpO2: 97%    Last Pain:  Vitals:   11/01/18 0741  TempSrc:   PainSc: 0-No pain                 [name]

## 2018-11-01 NOTE — H&P (Signed)
@LOGO @   Primary Care Physician:  Susy Frizzle, MD Primary Gastroenterologist:  Dr. Gala Romney  Pre-Procedure History & Physical: HPI:  Allison Freeman is a 73 y.o. female here for surveillance colonoscopy.  History of colonic adenoma.  Past Medical History:  Diagnosis Date  . Acid reflux   . Arthritis   . Depression   . Hypercholesterolemia   . Hypertension   . Insomnia   . Osteopenia   . Panic attacks   . Reflux   . Restless leg syndrome     Past Surgical History:  Procedure Laterality Date  . ABDOMINAL HYSTERECTOMY  2001  . BLADDER SURGERY    . BREAST BIOPSY  1987  . BREAST CYST EXCISION  1998  . CARPAL TUNNEL RELEASE Right 04/06/2015   Procedure: CARPAL TUNNEL RELEASE;  Surgeon: Carole Civil, MD;  Location: [facility];  Service: Orthopedics;  Laterality: Right;  . COLONOSCOPY  09/19/2004   TFT:DDUKGURK hemorrhoids, otherwise normal rectum/Sigmoid diverticula.  Remainder of colon mucosa appeared normal  . COLONOSCOPY WITH ESOPHAGOGASTRODUODENOSCOPY (EGD) N/A 06/24/2013   YHC:WCBJSEG polyp-removed. Small hiatal hernia. Abnormal gastric mucosa-query NSAID effect-s/p (chronic inactive gastritis, negative H. pylori)/TCS: Prominent internal hemorrhoids-likely source of hematochezia. Colonic diverticulosis. Colonic polyp-removed (tubular adenoma)  . CYSTOCELE REPAIR  2010  . DILATION AND CURETTAGE OF UTERUS    . KNEE SURGERY Right 2003  . RECTAL SURGERY    . RECTOCELE REPAIR  2010  . TONSILLECTOMY      Prior to Admission medications   Medication Sig Start Date End Date Taking? Authorizing Provider  ALPRAZolam Duanne Moron) 0.5 MG tablet TAKE ONE TABLET BY MOUTH EVERY NIGHT AT BEDTIME 10/28/18  Yes Susy Frizzle, MD  Calcium-Magnesium-Zinc [phone] MG TABS Take 1 tablet by mouth daily.   Yes [provider]  cetirizine (ZYRTEC) 10 MG tablet Take 10 mg by mouth daily.   Yes [provider]  famotidine (PEPCID) 40 MG tablet Take 1 tablet (40 mg total) by  mouth at bedtime. 08/31/18  Yes Mahala Menghini, PA-C  fluticasone (FLONASE) 50 MCG/ACT nasal spray Use 2 sprays in each  nostril once daily Patient taking differently: Place 2 sprays into both nostrils daily.  05/30/15  Yes Mikey Kirschner, MD  hydroxypropyl methylcellulose / hypromellose (ISOPTO TEARS / GONIOVISC) 2.5 % ophthalmic solution Place 1 drop into both eyes 3 (three) times daily.   Yes [provider]  losartan (COZAAR) 100 MG tablet TAKE ONE TABLET (100MG  TOTAL) BY MOUTH DAILY Patient taking differently: Take 100 mg by mouth daily.  08/23/18  Yes Susy Frizzle, MD  Magnesium 250 MG TABS Take 250 mg by mouth daily.    Yes [provider]  Multiple Vitamin (MULTIVITAMIN) tablet Take 1 tablet by mouth daily.   Yes [provider]  MYRBETRIQ 50 MG TB24 tablet Take 50 mg by mouth daily.  06/19/15  Yes [provider]  Omega-3 Fatty Acids (FISH OIL) 1200 MG CAPS Take 1,200 mg by mouth daily.    Yes [provider]  pantoprazole (PROTONIX) 40 MG tablet TAKE ONE TABLET BY MOUTH TWICE DAILY BEFORE MEALS Patient taking differently: Take 40 mg by mouth 2 (two) times daily before a meal.  07/26/18  Yes Susy Frizzle, MD  pravastatin (PRAVACHOL) 40 MG tablet Take 1 tablet (40 mg total) by mouth daily. 10/08/17  Yes Susy Frizzle, MD  rOPINIRole (REQUIP) 2 MG tablet Take 1 tablet (2 mg total) by mouth at bedtime. 09/23/18  Yes Pickard,  Cammie Mcgee, MD  traMADol (ULTRAM) 50 MG tablet TAKE ONE TABLET BY MOUTH THREE TIMES DAILY AS NEEDED Patient taking differently: Take 50 mg by mouth 3 (three) times daily as needed for moderate pain.  08/18/18  Yes Simpson, Modena Nunnery, MD  TURMERIC PO Take 1 capsule by mouth daily. With black pepper   Yes [provider]    Allergies as of 08/31/2018 - Review Complete 08/31/2018  Allergen Reaction Noted  . Other  01/19/2014    Family History  Problem Relation Age of Onset  . Cancer Mother        Breast   . Hypertension Father   . Heart attack Father   . Cancer Sister        Breast  . Cancer Sister        breast  . Breast cancer Sister   . Colon cancer Neg Hx     Social History   Socioeconomic History  . Marital status: Married    Spouse name: Not on file  . Number of children: Not on file  . Years of education: Not on file  . Highest education level: Not on file  Occupational History  . Not on file  Social Needs  . Financial resource strain: Not on file  . Food insecurity:    Worry: Not on file    Inability: Not on file  . Transportation needs:    Medical: Not on file    Non-medical: Not on file  Tobacco Use  . Smoking status: Never Smoker  . Smokeless tobacco: Never Used  Substance and Sexual Activity  . Alcohol use: No  . Drug use: No  . Sexual activity: Yes    Birth control/protection: Surgical  Lifestyle  . Physical activity:    Days per week: Not on file    Minutes per session: Not on file  . Stress: Not on file  Relationships  . Social connections:    Talks on phone: Not on file    Gets together: Not on file    Attends religious service: Not on file    Active member of club or organization: Not on file    Attends meetings of clubs or organizations: Not on file    Relationship status: Not on file  . Intimate partner violence:    Fear of current or ex partner: Not on file    Emotionally abused: Not on file    Physically abused: Not on file    Forced sexual activity: Not on file  Other Topics Concern  . Not on file  Social History Narrative  . Not on file    Review of Systems: See HPI, otherwise negative ROS  Physical Exam: BP 133/76   Temp 97.7 F (36.5 C) (Oral)   Resp 17   SpO2 97%  General:   Alert,  Well-developed, well-nourished, pleasant and cooperative in NAD Skin:  Intact without significant lesions or rashes. Neck:  Supple; no masses or thyromegaly. No significant cervical adenopathy. Lungs:  Clear throughout to auscultation.   No  wheezes, crackles, or rhonchi. No acute distress. Heart:  Regular rate and rhythm; no murmurs, clicks, rubs,  or gallops. Abdomen: Non-distended, normal bowel sounds.  Soft and nontender without appreciable mass or hepatosplenomegaly.  Pulses:  Normal pulses noted. Extremities:  Without clubbing or edema.  Impression/Plan: Pleasant 73 year old lady with history of colonic adenoma.  Here for surveillance colonoscopy.  The risks, benefits, limitations, alternatives and imponderables have been reviewed with the patient. Questions  have been answered. All parties are agreeable.      Notice: This dictation was prepared with Dragon dictation along with smaller phrase technology. Any transcriptional errors that result from this process are unintentional and may not be corrected upon review.

## 2018-11-05 ENCOUNTER — Encounter (HOSPITAL_COMMUNITY): Payer: Self-pay | Admitting: Internal Medicine

## 2018-11-12 ENCOUNTER — Ambulatory Visit: Payer: Medicare Other | Admitting: Family Medicine

## 2018-11-12 ENCOUNTER — Ambulatory Visit (INDEPENDENT_AMBULATORY_CARE_PROVIDER_SITE_OTHER): Payer: Medicare Other | Admitting: Family Medicine

## 2018-11-12 ENCOUNTER — Encounter: Payer: Self-pay | Admitting: Family Medicine

## 2018-11-12 VITALS — BP 144/82 | HR 86 | Temp 98.2°F | Resp 14 | Ht 65.0 in | Wt 160.0 lb

## 2018-11-12 DIAGNOSIS — R300 Dysuria: Secondary | ICD-10-CM

## 2018-11-12 LAB — URINALYSIS, ROUTINE W REFLEX MICROSCOPIC
Bacteria, UA: NONE SEEN /HPF
Bilirubin Urine: NEGATIVE
Glucose, UA: NEGATIVE
Hgb urine dipstick: NEGATIVE
Ketones, ur: NEGATIVE
Nitrite: NEGATIVE
Protein, ur: NEGATIVE
RBC / HPF: NONE SEEN /HPF (ref 0–2)
Specific Gravity, Urine: 1.025 (ref 1.001–1.03)
pH: 6.5 (ref 5.0–8.0)

## 2018-11-12 LAB — MICROSCOPIC MESSAGE

## 2018-11-12 MED ORDER — CIPROFLOXACIN HCL 500 MG PO TABS: 500.0000 mg | ORAL_TABLET | Freq: Two times a day (BID) | ORAL | 0 refills | Status: DC

## 2018-11-12 NOTE — Progress Notes (Signed)
Subjective:    Patient ID: Allison Freeman, female    DOB: [birth year], 73 y.o.   MRN: [id number]  HPI Patient presents with 4 days of dysuria.  She reports increased urinary frequency.  She reports increased urinary urgency.  She also has some mild right-sided low back pain.  She also reports some mild pressure-like pain in her suprapubic area directly over her bladder.  I am able to re-produce her pain with palpation over the bladder.  Urinalysis however is unremarkable aside from some trace leukocyte E[place]ace.  There are no blood or nitrites. Past Medical History:  Diagnosis Date  . Acid reflux   . Arthritis   . Depression   . Hypercholesterolemia   . Hypertension   . Insomnia   . Osteopenia   . Panic attacks   . Reflux   . Restless leg syndrome    Past Surgical History:  Procedure Laterality Date  . ABDOMINAL HYSTERECTOMY  2001  . BLADDER SURGERY    . BREAST BIOPSY  1987  . BREAST CYST EXCISION  1998  . CARPAL TUNNEL RELEASE Right 04/06/2015   Procedure: CARPAL TUNNEL RELEASE;  Surgeon: Carole Civil, MD;  Location: [facility];  Service: Orthopedics;  Laterality: Right;  . COLONOSCOPY  09/19/2004   JKD:[id number] hemorrhoids, otherwise normal rectum/Sigmoid diverticula.  Remainder of colon mucosa appeared normal  . COLONOSCOPY WITH ESOPHAGOGASTRODUODENOSCOPY (EGD) N/A 06/24/2013   YKD:[id number] polyp-removed. Small hiatal hernia. Abnormal gastric mucosa-query NSAID effect-s/p (chronic inactive gastritis, negative H. pylori)/TCS: Prominent internal hemorrhoids-likely source of hematochezia. Colonic diverticulosis. Colonic polyp-removed (tubular adenoma)  . COLONOSCOPY WITH PROPOFOL N/A 11/01/2018   Procedure: COLONOSCOPY WITH PROPOFOL;  Surgeon: Daneil Dolin, MD;  Location: [facility];  Service: Endoscopy;  Laterality: N/A;  7:30am  . CYSTOCELE REPAIR  2010  . DILATION AND CURETTAGE OF UTERUS    . KNEE SURGERY Right 2003  . RECTAL SURGERY    . RECTOCELE REPAIR  2010  .  TONSILLECTOMY     Current Outpatient Medications on File Prior to Visit  Medication Sig Dispense Refill  . ALPRAZolam (XANAX) 0.5 MG tablet TAKE ONE TABLET BY MOUTH EVERY NIGHT AT BEDTIME 30 tablet 0  . Calcium-Magnesium-Zinc 333-133-5 MG TABS Take 1 tablet by mouth daily.    . cetirizine (ZYRTEC) 10 MG tablet Take 10 mg by mouth daily.    . famotidine (PEPCID) 40 MG tablet Take 1 tablet (40 mg total) by mouth at bedtime. 90 tablet 3  . fluticasone (FLONASE) 50 MCG/ACT nasal spray Use 2 sprays in each  nostril once daily 48 g 3  . hydroxypropyl methylcellulose / hypromellose (ISOPTO TEARS / GONIOVISC) 2.5 % ophthalmic solution Place 1 drop into both eyes 3 (three) times daily.    Marland Kitchen losartan (COZAAR) 100 MG tablet TAKE ONE TABLET (100MG  TOTAL) BY MOUTH DAILY 90 tablet 3  . Magnesium 250 MG TABS Take 250 mg by mouth daily.     . Multiple Vitamin (MULTIVITAMIN) tablet Take 1 tablet by mouth daily.    Marland Kitchen MYRBETRIQ 50 MG TB24 tablet Take 50 mg by mouth daily.     . Omega-3 Fatty Acids (FISH OIL) 1200 MG CAPS Take 1,200 mg by mouth daily.     . pantoprazole (PROTONIX) 40 MG tablet TAKE ONE TABLET BY MOUTH TWICE DAILY BEFORE MEALS 180 tablet 2  . pravastatin (PRAVACHOL) 40 MG tablet Take 1 tablet (40 mg total) by mouth daily. 90 tablet 1  . rOPINIRole (REQUIP) 2 MG tablet Take 1  tablet (2 mg total) by mouth at bedtime. 30 tablet 3  . traMADol (ULTRAM) 50 MG tablet TAKE ONE TABLET BY MOUTH THREE TIMES DAILY AS NEEDED (Patient taking differently: Take 50 mg by mouth 3 (three) times daily as needed for moderate pain. ) 270 tablet 0  . TURMERIC PO Take 1 capsule by mouth daily. With black pepper     No current facility-administered medications on file prior to visit.    Allergies  Allergen Reactions  . Other Other (See Comments)    Arthritis medications- causes a bacteria in the abdomen   Social History   Socioeconomic History  . Marital status: Married    Spouse name: Not on file  . Number of  children: Not on file  . Years of education: Not on file  . Highest education level: Not on file  Occupational History  . Not on file  Social Needs  . Financial resource strain: Not on file  . Food insecurity:    Worry: Not on file    Inability: Not on file  . Transportation needs:    Medical: Not on file    Non-medical: Not on file  Tobacco Use  . Smoking status: Never Smoker  . Smokeless tobacco: Never Used  Substance and Sexual Activity  . Alcohol use: No  . Drug use: No  . Sexual activity: Yes    Birth control/protection: Surgical  Lifestyle  . Physical activity:    Days per week: Not on file    Minutes per session: Not on file  . Stress: Not on file  Relationships  . Social connections:    Talks on phone: Not on file    Gets together: Not on file    Attends religious service: Not on file    Active member of club or organization: Not on file    Attends meetings of clubs or organizations: Not on file    Relationship status: Not on file  . Intimate partner violence:    Fear of current or ex partner: Not on file    Emotionally abused: Not on file    Physically abused: Not on file    Forced sexual activity: Not on file  Other Topics Concern  . Not on file  Social History Narrative  . Not on file      Review of Systems  All other systems reviewed and are negative.      Objective:   Physical Exam  Constitutional: She appears well-developed and well-nourished.  Cardiovascular: Normal rate, regular rhythm and normal heart sounds.  No murmur heard. Pulmonary/Chest: Effort normal and breath sounds normal. No respiratory distress. She has no wheezes. She has no rales.  Abdominal: Soft. Bowel sounds are normal. She exhibits no distension. There is no abdominal tenderness. There is no rebound and no guarding.  Vitals reviewed.         Assessment & Plan:  Burning with urination - Plan: Urinalysis, Routine w reflex microscopic Symptoms are consistent with a  urinary tract infection.  Begin Cipro 500 mg p.o. twice daily for 3 days.  Recheck next week if no better or sooner if worse

## 2018-11-19 ENCOUNTER — Ambulatory Visit
Admission: RE | Admit: 2018-11-19 | Discharge: 2018-11-19 | Disposition: A | Discharge status: Home / Self Care | Payer: Medicare Other | Source: Ambulatory Visit | Attending: Family Medicine | Admitting: Family Medicine

## 2018-11-19 DIAGNOSIS — M810 Age-related osteoporosis without current pathological fracture: Secondary | ICD-10-CM | POA: Diagnosis not present

## 2018-11-19 DIAGNOSIS — Z780 Asymptomatic menopausal state: Secondary | ICD-10-CM | POA: Diagnosis not present

## 2018-11-19 DIAGNOSIS — M8580 Other specified disorders of bone density and structure, unspecified site: Secondary | ICD-10-CM

## 2018-11-22 ENCOUNTER — Encounter: Payer: Self-pay | Admitting: Family Medicine

## 2018-11-22 DIAGNOSIS — M810 Age-related osteoporosis without current pathological fracture: Secondary | ICD-10-CM | POA: Insufficient documentation

## 2018-11-23 ENCOUNTER — Other Ambulatory Visit: Payer: Self-pay | Admitting: Family Medicine

## 2018-11-23 NOTE — Telephone Encounter (Signed)
Pt is requesting refill on Xanax   LOV: 11/12/18  LRF:  10/28/18

## 2018-11-26 ENCOUNTER — Encounter: Payer: Self-pay | Admitting: Family Medicine

## 2018-11-26 ENCOUNTER — Ambulatory Visit (INDEPENDENT_AMBULATORY_CARE_PROVIDER_SITE_OTHER): Payer: Medicare Other | Admitting: Urology

## 2018-11-26 DIAGNOSIS — N952 Postmenopausal atrophic vaginitis: Secondary | ICD-10-CM | POA: Diagnosis not present

## 2018-11-26 DIAGNOSIS — N3941 Urge incontinence: Secondary | ICD-10-CM | POA: Diagnosis not present

## 2018-11-26 DIAGNOSIS — Z87440 Personal history of urinary (tract) infections: Secondary | ICD-10-CM

## 2018-11-26 MED ORDER — ALENDRONATE SODIUM 70 MG PO TABS: 70.0000 mg | ORAL_TABLET | ORAL | 11 refills | Status: DC

## 2018-12-07 ENCOUNTER — Encounter: Payer: Self-pay | Admitting: Family Medicine

## 2018-12-07 DIAGNOSIS — N3941 Urge incontinence: Secondary | ICD-10-CM | POA: Diagnosis not present

## 2018-12-09 ENCOUNTER — Encounter: Payer: Self-pay | Admitting: Family Medicine

## 2018-12-10 ENCOUNTER — Ambulatory Visit (INDEPENDENT_AMBULATORY_CARE_PROVIDER_SITE_OTHER): Payer: Medicare Other | Admitting: Family Medicine

## 2018-12-10 ENCOUNTER — Encounter: Payer: Self-pay | Admitting: Family Medicine

## 2018-12-10 ENCOUNTER — Other Ambulatory Visit: Payer: Self-pay

## 2018-12-10 VITALS — BP 132/70 | HR 80 | Temp 97.9°F | Resp 14 | Ht 65.0 in | Wt 160.0 lb

## 2018-12-10 DIAGNOSIS — J358 Other chronic diseases of tonsils and adenoids: Secondary | ICD-10-CM

## 2018-12-10 DIAGNOSIS — J029 Acute pharyngitis, unspecified: Secondary | ICD-10-CM

## 2018-12-10 NOTE — Progress Notes (Signed)
Subjective:    Patient ID: Allison Freeman, female    DOB: 03/02/1946, 73 y.o.   MRN: [id number]  HPI Patient presents today complaining of a scratchy throat.  She states that when she looked in her throat this morning she noticed a white nodular object on the left triangular fold.  She denies any significant pain.  She denies any fever or chills.  She denies any odynophagia or dysphagia.  She denies any rhinorrhea or cough.  There is no lymphadenopathy in the neck.  Strep screen today in the office is negative Past Medical History:  Diagnosis Date  . Acid reflux   . Arthritis   . Depression   . Hypercholesterolemia   . Hypertension   . Insomnia   . Osteoporosis   . Panic attacks   . Reflux   . Restless leg syndrome    Past Surgical History:  Procedure Laterality Date  . ABDOMINAL HYSTERECTOMY  2001  . BLADDER SURGERY    . BREAST BIOPSY  1987  . BREAST CYST EXCISION  1998  . CARPAL TUNNEL RELEASE Right 04/06/2015   Procedure: CARPAL TUNNEL RELEASE;  Surgeon: Carole Civil, MD;  Location: [facility];  Service: Orthopedics;  Laterality: Right;  . COLONOSCOPY  09/19/2004   JME:QASTMHDQ hemorrhoids, otherwise normal rectum/Sigmoid diverticula.  Remainder of colon mucosa appeared normal  . COLONOSCOPY WITH ESOPHAGOGASTRODUODENOSCOPY (EGD) N/A 06/24/2013   QIW:LNLGXQJ polyp-removed. Small hiatal hernia. Abnormal gastric mucosa-query NSAID effect-s/p (chronic inactive gastritis, negative H. pylori)/TCS: Prominent internal hemorrhoids-likely source of hematochezia. Colonic diverticulosis. Colonic polyp-removed (tubular adenoma)  . COLONOSCOPY WITH PROPOFOL N/A 11/01/2018   Procedure: COLONOSCOPY WITH PROPOFOL;  Surgeon: Daneil Dolin, MD;  Location[facility];  Service: Endoscopy;  Laterality: N/A;  7:30am  . CYSTOCELE REPAIR  2010  . DILATION AND CURETTAGE OF UTERUS    . KNEE SURGERY Right 2003  . RECTAL SURGERY    . RECTOCELE REPAIR  2010  . TONSILLECTOMY     Current  Outpatient Medications on File Prior to Visit  Medication Sig Dispense Refill  . ALPRAZolam (XANAX) 0.5 MG tablet TAKE ONE TABLET BY MOUTH EVERY NIGHT AT BEDTIME 30 tablet 0  . Calcium-Magnesium-Zinc 333-133-5 MG TABS Take 1 tablet by mouth daily.    . cetirizine (ZYRTEC) 10 MG tablet Take 10 mg by mouth daily.    . famotidine (PEPCID) 40 MG tablet Take 1 tablet (40 mg total) by mouth at bedtime. 90 tablet 3  . fluticasone (FLONASE) 50 MCG/ACT nasal spray Use 2 sprays in each  nostril once daily 48 g 3  . hydroxypropyl methylcellulose / hypromellose (ISOPTO TEARS / GONIOVISC) 2.5 % ophthalmic solution Place 1 drop into both eyes 3 (three) times daily.    Marland Kitchen losartan (COZAAR) 100 MG tablet TAKE ONE TABLET (100MG  TOTAL) BY MOUTH DAILY 90 tablet 3  . Magnesium 250 MG TABS Take 250 mg by mouth daily.     . Multiple Vitamin (MULTIVITAMIN) tablet Take 1 tablet by mouth daily.    Marland Kitchen MYRBETRIQ 50 MG TB24 tablet Take 50 mg by mouth daily.     . Omega-3 Fatty Acids (FISH OIL) 1200 MG CAPS Take 1,200 mg by mouth daily.     . pantoprazole (PROTONIX) 40 MG tablet TAKE ONE TABLET BY MOUTH TWICE DAILY BEFORE MEALS 180 tablet 2  . pravastatin (PRAVACHOL) 40 MG tablet Take 1 tablet (40 mg total) by mouth daily. 90 tablet 1  . rOPINIRole (REQUIP) 2 MG tablet Take 1 tablet (  2 mg total) by mouth at bedtime. 30 tablet 3  . traMADol (ULTRAM) 50 MG tablet TAKE ONE TABLET BY MOUTH THREE TIMES DAILY AS NEEDED (Patient taking differently: Take 50 mg by mouth 3 (three) times daily as needed for moderate pain. ) 270 tablet 0  . TURMERIC PO Take 1 capsule by mouth daily. With black pepper     No current facility-administered medications on file prior to visit.    Allergies  Allergen Reactions  . Other Other (See Comments)    Arthritis medications- causes a bacteria in the abdomen   Social History   Socioeconomic History  . Marital status: Married    Spouse name: Not on file  . Number of children: Not on file  .  Years of education: Not on file  . Highest education level: Not on file  Occupational History  . Not on file  Social Needs  . Financial resource strain: Not on file  . Food insecurity:    Worry: Not on file    Inability: Not on file  . Transportation needs:    Medical: Not on file    Non-medical: Not on file  Tobacco Use  . Smoking status: Never Smoker  . Smokeless tobacco: Never Used  Substance and Sexual Activity  . Alcohol use: No  . Drug use: No  . Sexual activity: Yes    Birth control/protection: Surgical  Lifestyle  . Physical activity:    Days per week: Not on file    Minutes per session: Not on file  . Stress: Not on file  Relationships  . Social connections:    Talks on phone: Not on file    Gets together: Not on file    Attends religious service: Not on file    Active member of club or organization: Not on file    Attends meetings of clubs or organizations: Not on file    Relationship status: Not on file  . Intimate partner violence:    Fear of current or ex partner: Not on file    Emotionally abused: Not on file    Physically abused: Not on file    Forced sexual activity: Not on file  Other Topics Concern  . Not on file  Social History Narrative  . Not on file      Review of Systems  All other systems reviewed and are negative.      Objective:   Physical Exam  Constitutional: She appears well-developed and well-nourished.  HENT:  Nose: Nose normal.  Mouth/Throat: Oropharynx is clear and moist. No oropharyngeal exudate, posterior oropharyngeal edema, posterior oropharyngeal erythema or tonsillar abscesses.    Neck: Neck supple.  Cardiovascular: Normal rate, regular rhythm and normal heart sounds.  No murmur heard. Pulmonary/Chest: Effort normal and breath sounds normal. No respiratory distress. She has no wheezes. She has no rales.  Abdominal: Soft. Bowel sounds are normal. She exhibits no distension. There is no abdominal tenderness. There is  no rebound and no guarding.  Lymphadenopathy:    She has no cervical adenopathy.  Vitals reviewed.         Assessment & Plan:  Sore throat - Plan: STREP GROUP A AG, W/REFLEX TO CULT  Tonsillar cyst Patient has a history of a tonsillectomy as a child.  I do not believe this is a peritonsillar abscess.  Instead I believe is a peritonsillar cyst.  Recommended warm salt water gargles to see if this will rupture on its own as well as gargling  with Listerine to avoid secondary infection.  If persistent more than 1 to 2 weeks, would recommend ENT consultation for drainage and/or biopsy

## 2018-12-12 LAB — CULTURE, GROUP A STREP
MICRO NUMBER:: 340871
SPECIMEN QUALITY: ADEQUATE

## 2018-12-12 LAB — STREP GROUP A AG, W/REFLEX TO CULT: Streptococcus, Group A Screen (Direct): NOT DETECTED

## 2018-12-14 DIAGNOSIS — N3941 Urge incontinence: Secondary | ICD-10-CM | POA: Diagnosis not present

## 2018-12-14 DIAGNOSIS — R350 Frequency of micturition: Secondary | ICD-10-CM | POA: Diagnosis not present

## 2018-12-20 ENCOUNTER — Other Ambulatory Visit: Payer: Self-pay | Admitting: Family Medicine

## 2018-12-20 NOTE — Telephone Encounter (Signed)
Ok to refill??  Last office visit 12/10/2018.  Last refill 08/18/2018.

## 2018-12-21 DIAGNOSIS — N3941 Urge incontinence: Secondary | ICD-10-CM | POA: Diagnosis not present

## 2018-12-21 DIAGNOSIS — R350 Frequency of micturition: Secondary | ICD-10-CM | POA: Diagnosis not present

## 2018-12-22 ENCOUNTER — Telehealth: Payer: Self-pay | Admitting: Family Medicine

## 2018-12-22 NOTE — Telephone Encounter (Signed)
PA Submitted through [email or web address] and received the following:  Your information has been sent to [facility].

## 2018-12-22 NOTE — Telephone Encounter (Signed)
Deniedtoday The drug you requested is an opioid medication (drug that helps with your pain). Your benefit provides for a 30 day supply of opioids. [employer] has approved coverage for TRAMADOL HCL 50 MG TABLET AND QUANTITY of up to 30 days under your Part D benefit for/through 09/22/2019. A longer supply will not be allowed. This determination was based on the [place].  Called pharm to make aware to run only 30 days and her ins will cover at that point but not for 90 day supply

## 2018-12-25 ENCOUNTER — Encounter: Payer: Self-pay | Admitting: Family Medicine

## 2018-12-27 ENCOUNTER — Other Ambulatory Visit: Payer: Self-pay | Admitting: Family Medicine

## 2018-12-27 MED ORDER — TRAMADOL HCL 50 MG PO TABS: ORAL_TABLET | ORAL | 2 refills | Status: DC

## 2018-12-27 NOTE — Telephone Encounter (Signed)
Ok to refill??  Last office visit 12/10/2018.  Last refill 11/23/2018.

## 2018-12-27 NOTE — Telephone Encounter (Signed)
Can you resend this - her ins will only cover 30 day supply.

## 2018-12-28 DIAGNOSIS — N3941 Urge incontinence: Secondary | ICD-10-CM | POA: Diagnosis not present

## 2018-12-28 DIAGNOSIS — R350 Frequency of micturition: Secondary | ICD-10-CM | POA: Diagnosis not present

## 2019-01-04 DIAGNOSIS — R350 Frequency of micturition: Secondary | ICD-10-CM | POA: Diagnosis not present

## 2019-01-04 DIAGNOSIS — N3941 Urge incontinence: Secondary | ICD-10-CM | POA: Diagnosis not present

## 2019-01-10 ENCOUNTER — Other Ambulatory Visit: Payer: Self-pay | Admitting: Family Medicine

## 2019-01-18 DIAGNOSIS — N3941 Urge incontinence: Secondary | ICD-10-CM | POA: Diagnosis not present

## 2019-01-18 DIAGNOSIS — R350 Frequency of micturition: Secondary | ICD-10-CM | POA: Diagnosis not present

## 2019-01-25 DIAGNOSIS — N3941 Urge incontinence: Secondary | ICD-10-CM | POA: Diagnosis not present

## 2019-01-31 ENCOUNTER — Other Ambulatory Visit: Payer: Self-pay | Admitting: Family Medicine

## 2019-01-31 NOTE — Telephone Encounter (Signed)
Ok to refill??  Last office visit 12/10/2018.  Last refill 12/27/2018.

## 2019-02-01 DIAGNOSIS — N3941 Urge incontinence: Secondary | ICD-10-CM | POA: Diagnosis not present

## 2019-02-02 DIAGNOSIS — L570 Actinic keratosis: Secondary | ICD-10-CM | POA: Diagnosis not present

## 2019-02-02 DIAGNOSIS — D1801 Hemangioma of skin and subcutaneous tissue: Secondary | ICD-10-CM | POA: Diagnosis not present

## 2019-02-02 DIAGNOSIS — L819 Disorder of pigmentation, unspecified: Secondary | ICD-10-CM | POA: Diagnosis not present

## 2019-02-02 DIAGNOSIS — B009 Herpesviral infection, unspecified: Secondary | ICD-10-CM | POA: Diagnosis not present

## 2019-02-08 DIAGNOSIS — G8929 Other chronic pain: Secondary | ICD-10-CM | POA: Diagnosis not present

## 2019-02-08 DIAGNOSIS — M25562 Pain in left knee: Secondary | ICD-10-CM | POA: Diagnosis not present

## 2019-02-08 DIAGNOSIS — M25561 Pain in right knee: Secondary | ICD-10-CM | POA: Diagnosis not present

## 2019-02-08 DIAGNOSIS — N3941 Urge incontinence: Secondary | ICD-10-CM | POA: Diagnosis not present

## 2019-02-10 ENCOUNTER — Encounter: Payer: Self-pay | Admitting: Family Medicine

## 2019-02-15 DIAGNOSIS — N3941 Urge incontinence: Secondary | ICD-10-CM | POA: Diagnosis not present

## 2019-02-17 ENCOUNTER — Other Ambulatory Visit: Payer: Self-pay | Admitting: Family Medicine

## 2019-02-22 DIAGNOSIS — N3941 Urge incontinence: Secondary | ICD-10-CM | POA: Diagnosis not present

## 2019-02-22 DIAGNOSIS — R350 Frequency of micturition: Secondary | ICD-10-CM | POA: Diagnosis not present

## 2019-02-23 ENCOUNTER — Ambulatory Visit (INDEPENDENT_AMBULATORY_CARE_PROVIDER_SITE_OTHER): Payer: Medicare Other

## 2019-02-23 ENCOUNTER — Other Ambulatory Visit: Payer: Self-pay

## 2019-02-23 DIAGNOSIS — M8588 Other specified disorders of bone density and structure, other site: Secondary | ICD-10-CM | POA: Diagnosis not present

## 2019-02-23 DIAGNOSIS — M8580 Other specified disorders of bone density and structure, unspecified site: Secondary | ICD-10-CM

## 2019-02-23 MED ORDER — DENOSUMAB 60 MG/ML ~~LOC~~ SOSY
60.0000 mg | PREFILLED_SYRINGE | SUBCUTANEOUS | Status: DC
  Administered 2019-02-23 – 2021-09-06 (×6): 60 mg via SUBCUTANEOUS

## 2019-02-23 NOTE — Progress Notes (Signed)
Patient came in today to receive her first prolia injection. Injection was given in patient's left arm. Patient tolerated well. Prolia information sheet given to patient. Advised to return in 6 months for next injection.

## 2019-03-17 ENCOUNTER — Other Ambulatory Visit (HOSPITAL_COMMUNITY): Payer: Self-pay | Admitting: Family Medicine

## 2019-03-17 DIAGNOSIS — Z1231 Encounter for screening mammogram for malignant neoplasm of breast: Secondary | ICD-10-CM

## 2019-03-24 DIAGNOSIS — N3941 Urge incontinence: Secondary | ICD-10-CM | POA: Diagnosis not present

## 2019-03-24 DIAGNOSIS — R350 Frequency of micturition: Secondary | ICD-10-CM | POA: Diagnosis not present

## 2019-03-30 ENCOUNTER — Other Ambulatory Visit: Payer: Self-pay | Admitting: Family Medicine

## 2019-03-30 NOTE — Telephone Encounter (Signed)
Ok to refill??  Last office visit 31/20/2020.  Last refill 01/31/2019.

## 2019-04-07 ENCOUNTER — Other Ambulatory Visit: Payer: Self-pay | Admitting: Orthopedic Surgery

## 2019-04-14 DIAGNOSIS — N3941 Urge incontinence: Secondary | ICD-10-CM | POA: Diagnosis not present

## 2019-04-14 DIAGNOSIS — R350 Frequency of micturition: Secondary | ICD-10-CM | POA: Diagnosis not present

## 2019-04-25 ENCOUNTER — Ambulatory Visit (HOSPITAL_COMMUNITY)
Admission: RE | Admit: 2019-04-25 | Discharge: 2019-04-25 | Disposition: A | Discharge status: Home / Self Care | Payer: Medicare Other | Source: Ambulatory Visit | Attending: Family Medicine | Admitting: Family Medicine

## 2019-04-25 ENCOUNTER — Other Ambulatory Visit: Payer: Self-pay

## 2019-04-25 DIAGNOSIS — Z1231 Encounter for screening mammogram for malignant neoplasm of breast: Secondary | ICD-10-CM | POA: Diagnosis not present

## 2019-04-25 DIAGNOSIS — M79672 Pain in left foot: Secondary | ICD-10-CM | POA: Diagnosis not present

## 2019-04-25 DIAGNOSIS — R2242 Localized swelling, mass and lump, left lower limb: Secondary | ICD-10-CM | POA: Diagnosis not present

## 2019-04-25 DIAGNOSIS — M67479 Ganglion, unspecified ankle and foot: Secondary | ICD-10-CM | POA: Diagnosis not present

## 2019-05-04 NOTE — Progress Notes (Signed)
EKG 10-26-18 EPIC

## 2019-05-04 NOTE — Patient Instructions (Addendum)
YOU NEED TO HAVE A COVID 19 TEST ON 05-05-2019. ONCE YOUR COVID TEST IS COMPLETED, PLEASE BEGIN THE QUARANTINE INSTRUCTIONS AS OUTLINED IN YOUR HANDOUT.                Allison Freeman     Your procedure is scheduled on: 05-09-2019   Report to [facility]  Entrance                Report to short stay at 530 AM   [address] [place].    Call this number if you have problems the morning of surgery [phone]    Remember: [place], NO CHEWING GUM CANDY OR MINTS.   NO SOLID FOOD AFTER MIDNIGHT THE NIGHT PRIOR TO SURGERY. NOTHING BY MOUTH EXCEPT CLEAR LIQUIDS UNTIL 500 AM. . PLEASE FINISH ENSURE DRINK PER SURGEON ORDER 3 HOURS PRIOR TO SCHEDULED SURGERY TIME WHICH NEEDS TO BE COMPLETED AT 530 AM.   CLEAR LIQUID DIET   Foods Allowed                                                                     Foods Excluded  Coffee and tea, regular and decaf                             liquids that you cannot  Plain Jell-O any favor except red or purple                                           see through such as: Fruit ices (not with fruit pulp)                                     milk, soups, orange juice  Iced Popsicles                                    All solid food Carbonated beverages, regular and diet                                    Cranberry, grape and apple juices Sports drinks like Gatorade Lightly seasoned clear broth or consume(fat free) Sugar, honey syrup  Sample Menu Breakfast                                Lunch                                     Supper Cranberry juice                    Beef broth  Chicken broth Jell-O                                     Grape juice                           Apple juice Coffee or tea                        Jell-O                                      Popsicle                                                 Coffee or tea                        Coffee or tea  _____________________________________________________________________     Take these medicines the morning of surgery with A SIP OF WATER: pantoprazole, myrbetriq, xyzal                                 You may not have any metal on your body including hair pins and              piercings  Do not wear jewelry, make-up, lotions, powders or perfumes, deodorant             Do not wear nail polish.  Do not shave  48 hours prior to surgery.                Do not bring valuables to the hospital. [place].  Contacts, dentures or bridgework may not be worn into surgery.                Please read over the following fact sheets you were given: _____________________________________________________________________           [facility] - Preparing for Surgery Before surgery, you can play an important role.  Because skin is not sterile, your skin needs to be as free of germs as possible.  You can reduce the number of germs on your skin by washing with CHG (chlorahexidine gluconate) soap before surgery.  CHG is an antiseptic cleaner which kills germs and bonds with the skin to continue killing germs even after washing. Please DO NOT use if you have an allergy to CHG or antibacterial soaps.  If your skin becomes reddened/irritated stop using the CHG and inform your nurse when you arrive at Short Stay. Do not shave (including legs and underarms) for at least 48 hours prior to the first CHG shower.  You may shave your face/neck. Please follow these instructions carefully:  1.  Shower with CHG Soap the night before surgery and the  morning of Surgery.  2.  If you choose to wash your hair, wash your hair first as usual with your  normal  shampoo.  3.  After you shampoo, rinse  your hair and body thoroughly to remove the  shampoo.                           4.  Use CHG as you would any other  liquid soap.  You can apply chg directly  to the skin and wash                       Gently with a scrungie or clean washcloth.  5.  Apply the CHG Soap to your body ONLY FROM THE NECK DOWN.   Do not use on face/ open                           Wound or open sores. Avoid contact with eyes, ears mouth and genitals (private parts).                       Wash face,  Genitals (private parts) with your normal soap.             6.  Wash thoroughly, paying special attention to the area where your surgery  will be performed.  7.  Thoroughly rinse your body with warm water from the neck down.  8.  DO NOT shower/wash with your normal soap after using and rinsing off  the CHG Soap.                9.  Pat yourself dry with a clean towel.            10.  Wear clean pajamas.            11.  Place clean sheets on your bed the night of your first shower and do not  sleep with pets. Day of Surgery : Do not apply any lotions/deodorants the morning of surgery.  Please wear clean clothes to the hospital/surgery center.  FAILURE TO FOLLOW THESE INSTRUCTIONS MAY RESULT IN THE CANCELLATION OF YOUR SURGERY PATIENT SIGNATURE_________________________________  NURSE SIGNATURE__________________________________  ________________________________________________________________________   Adam Phenix  An incentive spirometer is a tool that can help keep your lungs clear and active. This tool measures how well you are filling your lungs with each breath. Taking long deep breaths may help reverse or decrease the chance of developing breathing (pulmonary) problems (especially infection) following:  A long period of time when you are unable to move or be active. BEFORE THE PROCEDURE   If the spirometer includes an indicator to show your best effort, your nurse or respiratory therapist will set it to a desired goal.  If possible, sit up straight or lean slightly forward. Try not to slouch.  Hold the incentive  spirometer in an upright position. INSTRUCTIONS FOR USE  1. Sit on the edge of your bed if possible, or sit up as far as you can in bed or on a chair. 2. Hold the incentive spirometer in an upright position. 3. Breathe out normally. 4. Place the mouthpiece in your mouth and seal your lips tightly around it. 5. Breathe in slowly and as deeply as possible, raising the piston or the ball toward the top of the column. 6. Hold your breath for 3-5 seconds or for as long as possible. Allow the piston or ball to fall to the bottom of the column. 7. Remove the mouthpiece from your mouth and breathe out normally. 8. Rest for a few seconds and  repeat Steps 1 through 7 at least 10 times every 1-2 hours when you are awake. Take your time and take a few normal breaths between deep breaths. 9. The spirometer may include an indicator to show your best effort. Use the indicator as a goal to work toward during each repetition. 10. After each set of 10 deep breaths, practice coughing to be sure your lungs are clear. If you have an incision (the cut made at the time of surgery), support your incision when coughing by placing a pillow or rolled up towels firmly against it. Once you are able to get out of bed, walk around indoors and cough well. You may stop using the incentive spirometer when instructed by your caregiver.  RISKS AND COMPLICATIONS  Take your time so you do not get dizzy or light-headed.  If you are in pain, you may need to take or ask for pain medication before doing incentive spirometry. It is harder to take a deep breath if you are having pain. AFTER USE  Rest and breathe slowly and easily.  It can be helpful to keep track of a log of your progress. Your caregiver can provide you with a simple table to help with this. If you are using the spirometer at home, follow these instructions: [place] IF:   You are having difficultly using the spirometer.  You have trouble using the  spirometer as often as instructed.  Your pain medication is not giving enough relief while using the spirometer.  You develop fever of 100.5 F (38.1 C) or higher. SEEK IMMEDIATE MEDICAL CARE IF:   You cough up bloody sputum that had not been present before.  You develop fever of 102 F (38.9 C) or greater.  You develop worsening pain at or near the incision site. MAKE SURE YOU:   Understand these instructions.  Will watch your condition.  Will get help right away if you are not doing well or get worse. Document Released: 01/19/2007 Document Revised: 12/01/2011 Document Reviewed: 03/22/2007 [facility] Patient Information 2014 [place], [place].   ________________________________________________________________________

## 2019-05-05 ENCOUNTER — Encounter (HOSPITAL_COMMUNITY): Payer: Self-pay

## 2019-05-05 ENCOUNTER — Other Ambulatory Visit: Payer: Self-pay

## 2019-05-05 ENCOUNTER — Encounter (HOSPITAL_COMMUNITY)
Admission: RE | Admit: 2019-05-05 | Discharge: 2019-05-05 | Disposition: A | Discharge status: Home / Self Care | Payer: Medicare Other | Source: Ambulatory Visit | Attending: Orthopedic Surgery | Admitting: Orthopedic Surgery

## 2019-05-05 ENCOUNTER — Other Ambulatory Visit (HOSPITAL_COMMUNITY)
Admission: RE | Admit: 2019-05-05 | Discharge: 2019-05-05 | Disposition: A | Discharge status: Home / Self Care | Payer: Medicare Other | Source: Ambulatory Visit

## 2019-05-05 DIAGNOSIS — N3941 Urge incontinence: Secondary | ICD-10-CM | POA: Diagnosis not present

## 2019-05-05 DIAGNOSIS — Z01812 Encounter for preprocedural laboratory examination: Secondary | ICD-10-CM | POA: Insufficient documentation

## 2019-05-05 DIAGNOSIS — Z20828 Contact with and (suspected) exposure to other viral communicable diseases: Secondary | ICD-10-CM | POA: Insufficient documentation

## 2019-05-05 DIAGNOSIS — R350 Frequency of micturition: Secondary | ICD-10-CM | POA: Diagnosis not present

## 2019-05-05 DIAGNOSIS — M1711 Unilateral primary osteoarthritis, right knee: Secondary | ICD-10-CM | POA: Diagnosis not present

## 2019-05-05 HISTORY — DX: Family history of other specified conditions: Z84.89

## 2019-05-05 LAB — CBC WITH DIFFERENTIAL/PLATELET
Abs Immature Granulocytes: 0.03 10*3/uL (ref 0.00–0.07)
Basophils Absolute: 0 10*3/uL (ref 0.0–0.1)
Basophils Relative: 1 %
Eosinophils Absolute: 0.2 10*3/uL (ref 0.0–0.5)
Eosinophils Relative: 3 %
HCT: 35.2 % — ABNORMAL LOW (ref 36.0–46.0)
Hemoglobin: 11.2 g/dL — ABNORMAL LOW (ref 12.0–15.0)
Immature Granulocytes: 1 %
Lymphocytes Relative: 38 %
Lymphs Abs: 2.1 10*3/uL (ref 0.7–4.0)
MCH: 29.8 pg (ref 26.0–34.0)
MCHC: 31.8 g/dL (ref 30.0–36.0)
MCV: 93.6 fL (ref 80.0–100.0)
Monocytes Absolute: 0.6 10*3/uL (ref 0.1–1.0)
Monocytes Relative: 10 %
Neutro Abs: 2.6 10*3/uL (ref 1.7–7.7)
Neutrophils Relative %: 47 %
Platelets: 220 10*3/uL (ref 150–400)
RBC: 3.76 MIL/uL — ABNORMAL LOW (ref 3.87–5.11)
RDW: 12.8 % (ref 11.5–15.5)
WBC: 5.5 10*3/uL (ref 4.0–10.5)
nRBC: 0 % (ref 0.0–0.2)

## 2019-05-05 LAB — SURGICAL PCR SCREEN
MRSA, PCR: NEGATIVE
Staphylococcus aureus: NEGATIVE

## 2019-05-05 LAB — COMPREHENSIVE METABOLIC PANEL
ALT: 17 U/L (ref 0–44)
AST: 21 U/L (ref 15–41)
Albumin: 3.4 g/dL — ABNORMAL LOW (ref 3.5–5.0)
Alkaline Phosphatase: 40 U/L (ref 38–126)
Anion gap: 6 (ref 5–15)
BUN: 20 mg/dL (ref 8–23)
CO2: 25 mmol/L (ref 22–32)
Calcium: 8.5 mg/dL — ABNORMAL LOW (ref 8.9–10.3)
Chloride: 109 mmol/L (ref 98–111)
Creatinine, Ser: 0.87 mg/dL (ref 0.44–1.00)
GFR calc Af Amer: >60 mL/min (ref >60)
GFR calc non Af Amer: >60 mL/min (ref >60)
Glucose, Bld: 111 mg/dL — ABNORMAL HIGH (ref 70–99)
Potassium: 4.4 mmol/L (ref 3.5–5.1)
Sodium: 140 mmol/L (ref 135–145)
Total Bilirubin: 1 mg/dL (ref 0.3–1.2)
Total Protein: 6.5 g/dL (ref 6.5–8.1)

## 2019-05-05 LAB — SARS CORONAVIRUS 2 (TAT 6-24 HRS): SARS Coronavirus 2: NEGATIVE

## 2019-05-06 ENCOUNTER — Other Ambulatory Visit: Payer: Self-pay | Admitting: Orthopedic Surgery

## 2019-05-06 NOTE — Care Plan (Signed)
D/C home w/ family & HHPT- [facility]. Outpt PT scheduled w/ [facility] [place]

## 2019-05-08 MED ORDER — BUPIVACAINE LIPOSOME 1.3 % IJ SUSP
20.0000 mL | Freq: Once | INTRAMUSCULAR | Status: DC
  Filled 2019-05-08: qty 20

## 2019-05-08 NOTE — Anesthesia Preprocedure Evaluation (Addendum)
Anesthesia Evaluation  Patient identified by MRN, date of birth, ID band Patient awake    Reviewed: Allergy & Precautions, NPO status , Patient's Chart, lab work & pertinent test results  History of Anesthesia Complications Negative for: history of anesthetic complications  Airway Mallampati: II  TM Distance: >3 FB Neck ROM: Full    Dental  (+) Dental Advisory Given   Pulmonary  05/05/2019 SARS coronavirus NEG   breath sounds clear to auscultation       Cardiovascular hypertension, Pt. on medications (-) angina Rhythm:Regular Rate:Normal     Neuro/Psych negative neurological ROS     GI/Hepatic Neg liver ROS, GERD  Medicated and Controlled,  Endo/Other  negative endocrine ROS  Renal/GU negative Renal ROS     Musculoskeletal  (+) Arthritis , Osteoarthritis,    Abdominal   Peds  Hematology negative hematology ROS (+)   Anesthesia Other Findings   Reproductive/Obstetrics                            Anesthesia Physical Anesthesia Plan  ASA: II  Anesthesia Plan: Spinal   Post-op Pain Management:  Regional for Post-op pain   Induction:   PONV Risk Score and Plan: 2 and Ondansetron and Dexamethasone  Airway Management Planned: Natural Airway and Simple Face Mask  Additional Equipment:   Intra-op Plan:   Post-operative Plan:   Informed Consent: I have reviewed the patients History and Physical, chart, labs and discussed the procedure including the risks, benefits and alternatives for the proposed anesthesia with the patient or authorized representative who has indicated his/her understanding and acceptance.     Dental advisory given  Plan Discussed with: CRNA and Surgeon  Anesthesia Plan Comments: (Plan routine monitors, SAB with adductor canal block for post op analgesia)       Anesthesia Quick Evaluation

## 2019-05-09 ENCOUNTER — Ambulatory Visit (HOSPITAL_COMMUNITY): Payer: Medicare Other | Admitting: Physician Assistant

## 2019-05-09 ENCOUNTER — Ambulatory Visit (HOSPITAL_COMMUNITY): Payer: Medicare Other | Admitting: Anesthesiology

## 2019-05-09 ENCOUNTER — Encounter (HOSPITAL_COMMUNITY)
Admission: RE | Disposition: A | Discharge status: Home / Self Care | Payer: Self-pay | Source: Home / Self Care | Attending: Orthopedic Surgery

## 2019-05-09 ENCOUNTER — Observation Stay (HOSPITAL_COMMUNITY)
Admission: RE | Admit: 2019-05-09 | Discharge: 2019-05-10 | Disposition: A | Discharge status: Home / Self Care | Payer: Medicare Other | Attending: Orthopedic Surgery | Admitting: Orthopedic Surgery

## 2019-05-09 ENCOUNTER — Other Ambulatory Visit: Payer: Self-pay

## 2019-05-09 ENCOUNTER — Encounter (HOSPITAL_COMMUNITY): Payer: Self-pay | Admitting: *Deleted

## 2019-05-09 DIAGNOSIS — Z1159 Encounter for screening for other viral diseases: Secondary | ICD-10-CM | POA: Diagnosis not present

## 2019-05-09 DIAGNOSIS — E7800 Pure hypercholesterolemia, unspecified: Secondary | ICD-10-CM | POA: Insufficient documentation

## 2019-05-09 DIAGNOSIS — G4700 Insomnia, unspecified: Secondary | ICD-10-CM | POA: Diagnosis not present

## 2019-05-09 DIAGNOSIS — M810 Age-related osteoporosis without current pathological fracture: Secondary | ICD-10-CM | POA: Insufficient documentation

## 2019-05-09 DIAGNOSIS — Z79899 Other long term (current) drug therapy: Secondary | ICD-10-CM | POA: Diagnosis not present

## 2019-05-09 DIAGNOSIS — Z888 Allergy status to other drugs, medicaments and biological substances status: Secondary | ICD-10-CM | POA: Diagnosis not present

## 2019-05-09 DIAGNOSIS — G2581 Restless legs syndrome: Secondary | ICD-10-CM | POA: Insufficient documentation

## 2019-05-09 DIAGNOSIS — Z7982 Long term (current) use of aspirin: Secondary | ICD-10-CM | POA: Diagnosis not present

## 2019-05-09 DIAGNOSIS — M1711 Unilateral primary osteoarthritis, right knee: Secondary | ICD-10-CM | POA: Diagnosis not present

## 2019-05-09 DIAGNOSIS — Z7983 Long term (current) use of bisphosphonates: Secondary | ICD-10-CM | POA: Insufficient documentation

## 2019-05-09 DIAGNOSIS — I10 Essential (primary) hypertension: Secondary | ICD-10-CM | POA: Diagnosis not present

## 2019-05-09 DIAGNOSIS — F329 Major depressive disorder, single episode, unspecified: Secondary | ICD-10-CM | POA: Diagnosis not present

## 2019-05-09 DIAGNOSIS — Z96659 Presence of unspecified artificial knee joint: Secondary | ICD-10-CM

## 2019-05-09 DIAGNOSIS — K219 Gastro-esophageal reflux disease without esophagitis: Secondary | ICD-10-CM | POA: Diagnosis not present

## 2019-05-09 DIAGNOSIS — G8918 Other acute postprocedural pain: Secondary | ICD-10-CM | POA: Diagnosis not present

## 2019-05-09 HISTORY — PX: TOTAL KNEE ARTHROPLASTY: SHX125

## 2019-05-09 SURGERY — ARTHROPLASTY, KNEE, TOTAL
Anesthesia: Spinal | Site: Knee | Laterality: Right

## 2019-05-09 MED ORDER — PHENYLEPHRINE 40 MCG/ML (10ML) SYRINGE FOR IV PUSH (FOR BLOOD PRESSURE SUPPORT)
PREFILLED_SYRINGE | INTRAVENOUS | Status: DC | PRN
  Administered 2019-05-09: 80 ug via INTRAVENOUS

## 2019-05-09 MED ORDER — CELECOXIB 200 MG PO CAPS
400.0000 mg | ORAL_CAPSULE | Freq: Once | ORAL | Status: AC
  Administered 2019-05-09: 07:00:00 400 mg via ORAL
  Filled 2019-05-09: qty 2

## 2019-05-09 MED ORDER — SENNOSIDES-DOCUSATE SODIUM 8.6-50 MG PO TABS: 1.0000 | ORAL_TABLET | Freq: Every evening | ORAL | Status: DC | PRN

## 2019-05-09 MED ORDER — PROPOFOL 10 MG/ML IV BOLUS
INTRAVENOUS | Status: AC
  Filled 2019-05-09: qty 20

## 2019-05-09 MED ORDER — CHLORHEXIDINE GLUCONATE 4 % EX LIQD: 60.0000 mL | Freq: Once | CUTANEOUS | Status: DC

## 2019-05-09 MED ORDER — PROPOFOL 10 MG/ML IV BOLUS
INTRAVENOUS | Status: AC
  Filled 2019-05-09: qty 80

## 2019-05-09 MED ORDER — ONDANSETRON HCL 4 MG/2ML IJ SOLN
INTRAMUSCULAR | Status: DC | PRN
  Administered 2019-05-09: 4 mg via INTRAVENOUS

## 2019-05-09 MED ORDER — 0.9 % SODIUM CHLORIDE (POUR BTL) OPTIME
TOPICAL | Status: DC | PRN
  Administered 2019-05-09: 1000 mL

## 2019-05-09 MED ORDER — SODIUM CHLORIDE (PF) 0.9 % IJ SOLN
INTRAMUSCULAR | Status: AC
  Filled 2019-05-09: qty 20

## 2019-05-09 MED ORDER — ROPIVACAINE HCL 7.5 MG/ML IJ SOLN
INTRAMUSCULAR | Status: DC | PRN
  Administered 2019-05-09: 20 mL via PERINEURAL

## 2019-05-09 MED ORDER — TRANEXAMIC ACID-NACL 1000-0.7 MG/100ML-% IV SOLN
1000.0000 mg | Freq: Once | INTRAVENOUS | Status: AC
  Administered 2019-05-09: 12:00:00 1000 mg via INTRAVENOUS
  Filled 2019-05-09: qty 100

## 2019-05-09 MED ORDER — TRANEXAMIC ACID-NACL 1000-0.7 MG/100ML-% IV SOLN
1000.0000 mg | INTRAVENOUS | Status: AC
  Administered 2019-05-09: 08:00:00 1000 mg via INTRAVENOUS
  Filled 2019-05-09: qty 100

## 2019-05-09 MED ORDER — HYDROMORPHONE HCL 1 MG/ML IJ SOLN
INTRAMUSCULAR | Status: AC
  Filled 2019-05-09: qty 1

## 2019-05-09 MED ORDER — LOSARTAN POTASSIUM 50 MG PO TABS
100.0000 mg | ORAL_TABLET | Freq: Every day | ORAL | Status: DC
  Administered 2019-05-09 – 2019-05-10 (×2): 100 mg via ORAL
  Filled 2019-05-09 (×2): qty 2

## 2019-05-09 MED ORDER — METHOCARBAMOL 500 MG IVPB - SIMPLE MED
500.0000 mg | Freq: Four times a day (QID) | INTRAVENOUS | Status: DC | PRN
  Administered 2019-05-09: 11:00:00 500 mg via INTRAVENOUS
  Filled 2019-05-09: qty 50

## 2019-05-09 MED ORDER — DIPHENHYDRAMINE HCL 12.5 MG/5ML PO ELIX: 12.5000 mg | ORAL_SOLUTION | ORAL | Status: DC | PRN

## 2019-05-09 MED ORDER — METHOCARBAMOL 500 MG IVPB - SIMPLE MED
INTRAVENOUS | Status: AC
  Filled 2019-05-09: qty 50

## 2019-05-09 MED ORDER — HYDROMORPHONE HCL 1 MG/ML IJ SOLN
0.2500 mg | INTRAMUSCULAR | Status: DC | PRN
  Administered 2019-05-09 (×2): 0.5 mg via INTRAVENOUS

## 2019-05-09 MED ORDER — ROPINIROLE HCL 1 MG PO TABS
2.0000 mg | ORAL_TABLET | Freq: Every day | ORAL | Status: DC
  Administered 2019-05-09: 22:00:00 2 mg via ORAL
  Filled 2019-05-09: qty 2

## 2019-05-09 MED ORDER — PHENOL 1.4 % MT LIQD
1.0000 | OROMUCOSAL | Status: DC | PRN
  Filled 2019-05-09: qty 177

## 2019-05-09 MED ORDER — ASPIRIN EC 325 MG PO TBEC
325.0000 mg | DELAYED_RELEASE_TABLET | Freq: Two times a day (BID) | ORAL | Status: DC
  Administered 2019-05-10: 10:00:00 325 mg via ORAL
  Filled 2019-05-09: qty 1

## 2019-05-09 MED ORDER — PANTOPRAZOLE SODIUM 40 MG PO TBEC
40.0000 mg | DELAYED_RELEASE_TABLET | Freq: Two times a day (BID) | ORAL | Status: DC
  Administered 2019-05-09 – 2019-05-10 (×2): 40 mg via ORAL
  Filled 2019-05-09 (×2): qty 1

## 2019-05-09 MED ORDER — EPHEDRINE SULFATE-NACL 50-0.9 MG/10ML-% IV SOSY
PREFILLED_SYRINGE | INTRAVENOUS | Status: DC | PRN
  Administered 2019-05-09 (×2): 10 mg via INTRAVENOUS

## 2019-05-09 MED ORDER — MIDAZOLAM HCL 2 MG/2ML IJ SOLN
1.0000 mg | INTRAMUSCULAR | Status: DC
  Administered 2019-05-09: 08:00:00 1 mg via INTRAVENOUS
  Filled 2019-05-09: qty 2

## 2019-05-09 MED ORDER — FLEET ENEMA 7-19 GM/118ML RE ENEM: 1.0000 | ENEMA | Freq: Once | RECTAL | Status: DC | PRN

## 2019-05-09 MED ORDER — CEFAZOLIN SODIUM-DEXTROSE 2-4 GM/100ML-% IV SOLN
2.0000 g | Freq: Four times a day (QID) | INTRAVENOUS | Status: AC
  Administered 2019-05-09 (×2): 2 g via INTRAVENOUS
  Filled 2019-05-09 (×2): qty 100

## 2019-05-09 MED ORDER — ZOLPIDEM TARTRATE 5 MG PO TABS: 5.0000 mg | ORAL_TABLET | Freq: Every evening | ORAL | Status: DC | PRN

## 2019-05-09 MED ORDER — ALPRAZOLAM 0.5 MG PO TABS
0.5000 mg | ORAL_TABLET | Freq: Every day | ORAL | Status: DC
  Administered 2019-05-09: 22:00:00 0.5 mg via ORAL
  Filled 2019-05-09: qty 1

## 2019-05-09 MED ORDER — MENTHOL 3 MG MT LOZG: 1.0000 | LOZENGE | OROMUCOSAL | Status: DC | PRN

## 2019-05-09 MED ORDER — TRAMADOL HCL 50 MG PO TABS
50.0000 mg | ORAL_TABLET | Freq: Four times a day (QID) | ORAL | Status: DC
  Administered 2019-05-09 – 2019-05-10 (×4): 50 mg via ORAL
  Filled 2019-05-09 (×4): qty 1

## 2019-05-09 MED ORDER — PRAVASTATIN SODIUM 40 MG PO TABS
40.0000 mg | ORAL_TABLET | Freq: Every day | ORAL | Status: DC
  Administered 2019-05-09: 22:00:00 40 mg via ORAL
  Filled 2019-05-09 (×2): qty 1
  Filled 2019-05-09: qty 2

## 2019-05-09 MED ORDER — PROPOFOL 500 MG/50ML IV EMUL
INTRAVENOUS | Status: DC | PRN
  Administered 2019-05-09: 75 ug/kg/min via INTRAVENOUS

## 2019-05-09 MED ORDER — GABAPENTIN 300 MG PO CAPS
300.0000 mg | ORAL_CAPSULE | Freq: Once | ORAL | Status: AC
  Administered 2019-05-09: 07:00:00 300 mg via ORAL
  Filled 2019-05-09: qty 1

## 2019-05-09 MED ORDER — DEXAMETHASONE SODIUM PHOSPHATE 10 MG/ML IJ SOLN
8.0000 mg | Freq: Once | INTRAMUSCULAR | Status: AC
  Administered 2019-05-09: 10 mg via INTRAVENOUS

## 2019-05-09 MED ORDER — ACETAMINOPHEN 500 MG PO TABS
1000.0000 mg | ORAL_TABLET | Freq: Four times a day (QID) | ORAL | Status: AC
  Administered 2019-05-09 – 2019-05-10 (×4): 1000 mg via ORAL
  Filled 2019-05-09 (×4): qty 2

## 2019-05-09 MED ORDER — FENTANYL CITRATE (PF) 100 MCG/2ML IJ SOLN
INTRAMUSCULAR | Status: AC
  Filled 2019-05-09: qty 2

## 2019-05-09 MED ORDER — SODIUM CHLORIDE 0.9 % IV SOLN
INTRAVENOUS | Status: DC
  Administered 2019-05-09 – 2019-05-10 (×2): via INTRAVENOUS

## 2019-05-09 MED ORDER — METHOCARBAMOL 500 MG PO TABS: 500.0000 mg | ORAL_TABLET | Freq: Four times a day (QID) | ORAL | Status: DC | PRN

## 2019-05-09 MED ORDER — FENTANYL CITRATE (PF) 100 MCG/2ML IJ SOLN
50.0000 ug | INTRAMUSCULAR | Status: DC
  Administered 2019-05-09: 08:00:00 50 ug via INTRAVENOUS
  Filled 2019-05-09: qty 2

## 2019-05-09 MED ORDER — EPHEDRINE 5 MG/ML INJ
INTRAVENOUS | Status: AC
  Filled 2019-05-09: qty 10

## 2019-05-09 MED ORDER — FERROUS SULFATE 325 (65 FE) MG PO TABS
325.0000 mg | ORAL_TABLET | Freq: Three times a day (TID) | ORAL | Status: DC
  Administered 2019-05-09 – 2019-05-10 (×3): 325 mg via ORAL
  Filled 2019-05-09 (×3): qty 1

## 2019-05-09 MED ORDER — ALUM & MAG HYDROXIDE-SIMETH 200-200-20 MG/5ML PO SUSP: 30.0000 mL | ORAL | Status: DC | PRN

## 2019-05-09 MED ORDER — SODIUM CHLORIDE 0.9% FLUSH
INTRAVENOUS | Status: DC | PRN
  Administered 2019-05-09: 20 mL

## 2019-05-09 MED ORDER — LACTATED RINGERS IV SOLN
INTRAVENOUS | Status: DC
  Administered 2019-05-09 (×3): via INTRAVENOUS

## 2019-05-09 MED ORDER — OXYCODONE HCL 5 MG PO TABS
5.0000 mg | ORAL_TABLET | ORAL | Status: DC | PRN
  Administered 2019-05-09 – 2019-05-10 (×4): 10 mg via ORAL
  Filled 2019-05-09 (×4): qty 2

## 2019-05-09 MED ORDER — PROPOFOL 10 MG/ML IV BOLUS
INTRAVENOUS | Status: DC | PRN
  Administered 2019-05-09: 30 mg via INTRAVENOUS

## 2019-05-09 MED ORDER — DOCUSATE SODIUM 100 MG PO CAPS
100.0000 mg | ORAL_CAPSULE | Freq: Two times a day (BID) | ORAL | Status: DC
  Administered 2019-05-09 – 2019-05-10 (×2): 100 mg via ORAL
  Filled 2019-05-09 (×2): qty 1

## 2019-05-09 MED ORDER — BUPIVACAINE LIPOSOME 1.3 % IJ SUSP
INTRAMUSCULAR | Status: DC | PRN
  Administered 2019-05-09: 20 mL

## 2019-05-09 MED ORDER — CEFAZOLIN SODIUM-DEXTROSE 2-4 GM/100ML-% IV SOLN
2.0000 g | INTRAVENOUS | Status: AC
  Administered 2019-05-09: 2 g via INTRAVENOUS
  Filled 2019-05-09: qty 100

## 2019-05-09 MED ORDER — METOCLOPRAMIDE HCL 5 MG/ML IJ SOLN: 5.0000 mg | Freq: Three times a day (TID) | INTRAMUSCULAR | Status: DC | PRN

## 2019-05-09 MED ORDER — SODIUM CHLORIDE 0.9 % IR SOLN
Status: DC | PRN
  Administered 2019-05-09: 1000 mL

## 2019-05-09 MED ORDER — MEPERIDINE HCL 50 MG/ML IJ SOLN: 6.2500 mg | INTRAMUSCULAR | Status: DC | PRN

## 2019-05-09 MED ORDER — POVIDONE-IODINE 10 % EX SWAB: 2.0000 "application " | Freq: Once | CUTANEOUS | Status: DC

## 2019-05-09 MED ORDER — BUPIVACAINE-EPINEPHRINE 0.25% -1:200000 IJ SOLN
INTRAMUSCULAR | Status: DC | PRN
  Administered 2019-05-09: 30 mL

## 2019-05-09 MED ORDER — GABAPENTIN 300 MG PO CAPS
300.0000 mg | ORAL_CAPSULE | Freq: Three times a day (TID) | ORAL | Status: DC
  Administered 2019-05-09 – 2019-05-10 (×3): 300 mg via ORAL
  Filled 2019-05-09 (×3): qty 1

## 2019-05-09 MED ORDER — METOCLOPRAMIDE HCL 5 MG PO TABS: 5.0000 mg | ORAL_TABLET | Freq: Three times a day (TID) | ORAL | Status: DC | PRN

## 2019-05-09 MED ORDER — FENTANYL CITRATE (PF) 100 MCG/2ML IJ SOLN
INTRAMUSCULAR | Status: DC | PRN
  Administered 2019-05-09 (×2): 50 ug via INTRAVENOUS

## 2019-05-09 MED ORDER — PANTOPRAZOLE SODIUM 40 MG PO TBEC: 40.0000 mg | DELAYED_RELEASE_TABLET | Freq: Every day | ORAL | Status: DC

## 2019-05-09 MED ORDER — DEXAMETHASONE SODIUM PHOSPHATE 10 MG/ML IJ SOLN
10.0000 mg | Freq: Once | INTRAMUSCULAR | Status: AC
  Administered 2019-05-10: 10:00:00 10 mg via INTRAVENOUS
  Filled 2019-05-09: qty 1

## 2019-05-09 MED ORDER — ONDANSETRON HCL 4 MG PO TABS: 4.0000 mg | ORAL_TABLET | Freq: Four times a day (QID) | ORAL | Status: DC | PRN

## 2019-05-09 MED ORDER — BISACODYL 5 MG PO TBEC: 5.0000 mg | DELAYED_RELEASE_TABLET | Freq: Every day | ORAL | Status: DC | PRN

## 2019-05-09 MED ORDER — ACETAMINOPHEN 500 MG PO TABS
1000.0000 mg | ORAL_TABLET | Freq: Once | ORAL | Status: AC
  Administered 2019-05-09: 07:00:00 1000 mg via ORAL
  Filled 2019-05-09: qty 2

## 2019-05-09 MED ORDER — ONDANSETRON HCL 4 MG/2ML IJ SOLN: 4.0000 mg | Freq: Four times a day (QID) | INTRAMUSCULAR | Status: DC | PRN

## 2019-05-09 MED ORDER — WATER FOR IRRIGATION, STERILE IR SOLN
Status: DC | PRN
  Administered 2019-05-09: 2000 mL

## 2019-05-09 MED ORDER — HYDROMORPHONE HCL 1 MG/ML IJ SOLN: 0.5000 mg | INTRAMUSCULAR | Status: DC | PRN

## 2019-05-09 MED ORDER — PROMETHAZINE HCL 25 MG/ML IJ SOLN: 6.2500 mg | INTRAMUSCULAR | Status: DC | PRN

## 2019-05-09 MED ORDER — FAMOTIDINE 20 MG PO TABS
40.0000 mg | ORAL_TABLET | Freq: Every day | ORAL | Status: DC
  Administered 2019-05-09: 22:00:00 20 mg via ORAL
  Filled 2019-05-09: qty 2

## 2019-05-09 MED ORDER — MIDAZOLAM HCL 2 MG/2ML IJ SOLN: 0.5000 mg | Freq: Once | INTRAMUSCULAR | Status: DC | PRN

## 2019-05-09 MED ORDER — BUPIVACAINE-EPINEPHRINE (PF) 0.25% -1:200000 IJ SOLN
INTRAMUSCULAR | Status: AC
  Filled 2019-05-09: qty 30

## 2019-05-09 MED ORDER — MIRABEGRON ER 25 MG PO TB24
50.0000 mg | ORAL_TABLET | Freq: Every day | ORAL | Status: DC
  Administered 2019-05-10: 10:00:00 50 mg via ORAL
  Filled 2019-05-09: qty 2

## 2019-05-09 SURGICAL SUPPLY — 66 items
ARTISURF 11M PLY R 6-9CD KNEE (Knees) ×2 IMPLANT
BAG SPEC THK2 15X12 ZIP CLS (MISCELLANEOUS) ×1
BAG ZIPLOCK 12X15 (MISCELLANEOUS) ×3 IMPLANT
BLADE SAGITTAL 13X1.27X60 (BLADE) ×2 IMPLANT
BLADE SAGITTAL 13X1.27X60MM (BLADE) ×1
BLADE SAW SGTL 83.5X18.5 (BLADE) ×3 IMPLANT
BLADE SURG 15 STRL LF DISP TIS (BLADE) ×1 IMPLANT
BLADE SURG 15 STRL SS (BLADE) ×3
BLADE SURG SZ10 CARB STEEL (BLADE) ×6 IMPLANT
BNDG CMPR MED 10X6 ELC LF (GAUZE/BANDAGES/DRESSINGS) ×1
BNDG ELASTIC 6X10 VLCR STRL LF (GAUZE/BANDAGES/DRESSINGS) ×2 IMPLANT
BNDG ELASTIC 6X5.8 VLCR STR LF (GAUZE/BANDAGES/DRESSINGS) ×3 IMPLANT
BOWL SMART MIX CTS (DISPOSABLE) ×3 IMPLANT
BSPLAT TIB 5D D CMNT STM RT (Knees) ×1 IMPLANT
CEMENT BONE SIMPLEX SPEEDSET (Cement) ×6 IMPLANT
CLOSURE WOUND 1/2 X4 (GAUZE/BANDAGES/DRESSINGS) ×1
COVER SURGICAL LIGHT HANDLE (MISCELLANEOUS) ×3 IMPLANT
COVER WAND RF STERILE (DRAPES) IMPLANT
CUFF TOURN SGL QUICK 34 (TOURNIQUET CUFF) ×3
CUFF TRNQT CYL 34X4.125X (TOURNIQUET CUFF) ×1 IMPLANT
DECANTER SPIKE VIAL GLASS SM (MISCELLANEOUS) ×6 IMPLANT
DRAPE INCISE IOBAN 66X45 STRL (DRAPES) ×6 IMPLANT
DRAPE U-SHAPE 47X51 STRL (DRAPES) ×3 IMPLANT
DRILL PIN HEADLESS TROCAR 3X75 (PIN) ×2 IMPLANT
DRSG AQUACEL AG ADV 3.5X10 (GAUZE/BANDAGES/DRESSINGS) ×3 IMPLANT
DURAPREP 26ML APPLICATOR (WOUND CARE) ×6 IMPLANT
ELECT REM PT RETURN 15FT ADLT (MISCELLANEOUS) ×3 IMPLANT
FEMUR  CMT CCR STD SZ6 R KNEE (Knees) ×2 IMPLANT
FEMUR CMT CCR STD SZ6 R KNEE (Knees) ×1 IMPLANT
FEMUR CMTD CCR STD SZ6 R KNEE (Knees) IMPLANT
GLOVE BIOGEL M STRL SZ7.5 (GLOVE) ×3 IMPLANT
GLOVE BIOGEL PI IND STRL 7.5 (GLOVE) ×1 IMPLANT
GLOVE BIOGEL PI IND STRL 8.5 (GLOVE) ×2 IMPLANT
GLOVE BIOGEL PI INDICATOR 7.5 (GLOVE) ×2
GLOVE BIOGEL PI INDICATOR 8.5 (GLOVE) ×4
GLOVE SURG ORTHO 8.0 STRL STRW (GLOVE) ×9 IMPLANT
GOWN STRL REUS W/ TWL XL LVL3 (GOWN DISPOSABLE) ×2 IMPLANT
GOWN STRL REUS W/TWL XL LVL3 (GOWN DISPOSABLE) ×6
HANDPIECE INTERPULSE COAX TIP (DISPOSABLE) ×3
HOLDER FOLEY CATH W/STRAP (MISCELLANEOUS) ×3 IMPLANT
HOOD PEEL AWAY FLYTE STAYCOOL (MISCELLANEOUS) ×9 IMPLANT
KIT TURNOVER KIT A (KITS) IMPLANT
MANIFOLD NEPTUNE II (INSTRUMENTS) ×3 IMPLANT
NEEDLE HYPO 22GX1.5 SAFETY (NEEDLE) ×3 IMPLANT
NS IRRIG 1000ML POUR BTL (IV SOLUTION) ×3 IMPLANT
PACK TOTAL KNEE CUSTOM (KITS) ×3 IMPLANT
PROTECTOR NERVE ULNAR (MISCELLANEOUS) ×3 IMPLANT
SET HNDPC FAN SPRY TIP SCT (DISPOSABLE) ×1 IMPLANT
STEM POLY PAT PLY 32M KNEE (Knees) ×2 IMPLANT
STEM TIBIA 5 DEG SZ D R KNEE (Knees) IMPLANT
STRIP CLOSURE SKIN 1/2X4 (GAUZE/BANDAGES/DRESSINGS) ×2 IMPLANT
SUT BONE WAX W31G (SUTURE) ×3 IMPLANT
SUT MNCRL AB 3-0 PS2 18 (SUTURE) ×3 IMPLANT
SUT STRATAFIX 0 PDS 27 VIOLET (SUTURE) ×3
SUT STRATAFIX PDS+ 0 24IN (SUTURE) ×3 IMPLANT
SUT VIC AB 1 CT1 27 (SUTURE) ×3
SUT VIC AB 1 CT1 27XBRD ANTBC (SUTURE) IMPLANT
SUT VIC AB 1 CT1 36 (SUTURE) ×3 IMPLANT
SUTURE STRATFX 0 PDS 27 VIOLET (SUTURE) ×1 IMPLANT
SYR CONTROL 10ML LL (SYRINGE) ×6 IMPLANT
TAPE STRIPS DRAPE STRL (GAUZE/BANDAGES/DRESSINGS) ×2 IMPLANT
TIBIA STEM 5 DEG SZ D R KNEE (Knees) ×3 IMPLANT
TRAY FOLEY MTR SLVR 16FR STAT (SET/KITS/TRAYS/PACK) ×3 IMPLANT
WATER STERILE IRR 1000ML POUR (IV SOLUTION) ×6 IMPLANT
WRAP KNEE MAXI GEL POST OP (GAUZE/BANDAGES/DRESSINGS) ×3 IMPLANT
YANKAUER SUCT BULB TIP 10FT TU (MISCELLANEOUS) ×3 IMPLANT

## 2019-05-09 NOTE — Anesthesia Postprocedure Evaluation (Signed)
Anesthesia Post Note  Patient: Allison Freeman  Procedure(s) Performed: TOTAL KNEE ARTHROPLASTY (Right Knee)     Patient location during evaluation: PACU Anesthesia Type: Spinal Level of consciousness: awake and alert, oriented and patient cooperative Pain management: pain level controlled Vital Signs Assessment: post-procedure vital signs reviewed and stable Respiratory status: spontaneous breathing, nonlabored ventilation and respiratory function stable Cardiovascular status: blood pressure returned to baseline and stable Postop Assessment: spinal receding Anesthetic complications: no    Last Vitals:  Vitals:   05/09/19 1100 05/09/19 1121  BP: 128/70 135/72  Pulse: 64 73  Resp: 10 17  Temp: (!) 36.4 C (!) 36.3 C  SpO2: 99% 100%    Last Pain:  Vitals:   05/09/19 1121  TempSrc: Oral  PainSc:                  Okley Magnussen,E. Tongela Encinas

## 2019-05-09 NOTE — Evaluation (Signed)
Physical Therapy Evaluation Patient Details Name: Allison Freeman MRN: [id number] DOB: November 27, 1945 Today's Date: 05/09/2019   History of Present Illness  R TKA PMHx: panic attacks, CTS right wrist  Clinical Impression  Pt is s/p TKA resulting in the deficits listed below (see PT Problem List).  Pt amb 60' with RW and min assist, should continue to progress well in acute setting, likely ready for d/c tomorrow Pt will benefit from skilled PT to increase their independence and safety with mobility to allow discharge to the venue listed below.      Follow Up Recommendations Follow surgeon's recommendation for DC plan and follow-up therapies    Equipment Recommendations  Rolling walker with 5" wheels    Recommendations for Other Services       Precautions / Restrictions Precautions Precautions: Fall;Knee Restrictions RLE Weight Bearing: Weight bearing as tolerated      Mobility  Bed Mobility Overal bed mobility: Needs Assistance Bed Mobility: Supine to Sit     Supine to sit: Min assist     General bed mobility comments: assist with RLE  Transfers Overall transfer level: Needs assistance Equipment used: Rolling walker (2 wheeled) Transfers: Sit to/from Stand Sit to Stand: Min guard         General transfer comment: cues for hand placement and RLE position  Ambulation/Gait Ambulation/Gait assistance: Min assist;Min guard Gait Distance (Feet): 60 Feet Assistive device: Rolling walker (2 wheeled) Gait Pattern/deviations: Step-to pattern;Decreased stance time - right     General Gait Details: cues for sequence and RW position  Stairs            Wheelchair Mobility    Modified Rankin (Stroke Patients Only)       Balance                                             Pertinent Vitals/Pain Pain Assessment: 0-10 Pain Location: right knee Pain Descriptors / Indicators: Grimacing;Sore Pain Intervention(s): Limited activity within  patient's tolerance;Monitored during session    Katherine expects to be discharged to:: Private residence Living Arrangements: Spouse/significant other Available Help at Discharge: Family Type of Home: House Home Access: Stairs to enter   Technical brewer of Steps: 2 Home Layout: One level Home Equipment: None      Prior Function Level of Independence: Independent               Hand Dominance        Extremity/Trunk Assessment   Upper Extremity Assessment Upper Extremity Assessment: Overall WFL for tasks assessed    Lower Extremity Assessment Lower Extremity Assessment: RLE deficits/detail RLE Deficits / Details: ankle WFL. knee extension and hip flexion 2+/5       Communication   Communication: No difficulties  Cognition Arousal/Alertness: Awake/alert Behavior During Therapy: WFL for tasks assessed/performed Overall Cognitive Status: Within Functional Limits for tasks assessed                                        General Comments      Exercises Total Joint Exercises Ankle Circles/Pumps: AROM;10 reps;Both Quad Sets: 5 reps;AROM;Both   Assessment/Plan    PT Assessment Patient needs continued PT services  PT Problem List Decreased strength;Decreased range of motion;Decreased activity tolerance;Pain;Decreased knowledge of use of DME;Decreased  mobility       PT Treatment Interventions DME instruction;Gait training;Therapeutic exercise;Stair training;Functional mobility training;Therapeutic activities;Patient/family education    PT Goals (Current goals can be found in the Care Plan section)  Acute Rehab PT Goals Time For Goal Achievement: 05/16/19    Frequency 7X/week   Barriers to discharge        Co-evaluation               AM-PAC PT "6 Clicks" Mobility  Outcome Measure Help needed turning from your back to your side while in a flat bed without using bedrails?: A Little Help needed moving from  lying on your back to sitting on the side of a flat bed without using bedrails?: A Little Help needed moving to and from a bed to a chair (including a wheelchair)?: A Little Help needed standing up from a chair using your arms (e.g., wheelchair or bedside chair)?: A Little Help needed to walk in hospital room?: A Little Help needed climbing 3-5 steps with a railing? : A Little 6 Click Score: 18    End of Session Equipment Utilized During Treatment: Gait belt Activity Tolerance: Patient tolerated treatment well Patient left: in chair;with call bell/phone within reach;with family/visitor present;with chair alarm set   PT Visit Diagnosis: Difficulty in walking, not elsewhere classified (R26.2)    Time: 5697-9480 PT Time Calculation (min) (ACUTE ONLY): 13 min   Charges:   PT Evaluation $PT Eval Low Complexity: 1 Low          Kenyon Ana, PT  Pager: [phone] Acute Rehab Dept [facility]): [phone]   05/09/2019   [facility] 05/09/2019, 5:08 PM

## 2019-05-09 NOTE — H&P (Signed)
Allison Freeman MRN:  [id number] DOB/SEX:  1946-01-22/female  CHIEF COMPLAINT:  Painful right Knee  HISTORY: Patient is a 73 y.o. female presented with a history of pain in the right knee. Onset of symptoms was gradual starting a few years ago with gradually worsening course since that time. Patient has been treated conservatively with over-the-counter NSAIDs and activity modification. Patient currently rates pain in the knee at 10 out of 10 with activity. There is pain at night.  PAST MEDICAL HISTORY: Patient Active Problem List   Diagnosis Date Noted  . Osteoporosis   . History of colonic polyps 08/31/2018  . Carpal tunnel syndrome of right wrist   . Osteoarthritis 01/21/2014  . Abdominal pain, epigastric 06/16/2013  . Rectal bleeding 06/16/2013  . Hemorrhoids 06/16/2013  . Other and unspecified hyperlipidemia 05/16/2013  . Allergic rhinitis 05/16/2013  . Esophageal reflux 04/15/2013  . Difficulty in walking(719.7) 12/20/2012  . Bilateral leg weakness 12/20/2012  . Hip bursitis 12/14/2012  . Back pain 12/14/2012  . Hip pain, bilateral 12/14/2012   Past Medical History:  Diagnosis Date  . Acid reflux   . Arthritis   . Depression   . Family history of adverse reaction to anesthesia    Mother woke up and could hearduring surgery  . Hypercholesterolemia   . Hypertension   . Insomnia   . Osteoporosis   . Panic attacks    panic attacks  . Reflux   . Restless leg syndrome    Past Surgical History:  Procedure Laterality Date  . ABDOMINAL HYSTERECTOMY  2001  . BLADDER SURGERY    . BREAST BIOPSY  1987  . BREAST CYST EXCISION  1998  . CARPAL TUNNEL RELEASE Right 04/06/2015   Procedure: CARPAL TUNNEL RELEASE;  Surgeon: Carole Civil, MD;  Location: [facility] ORS;  Service: Orthopedics;  Laterality: Right;  . COLONOSCOPY  09/19/2004   FUX:NATFTDDU hemorrhoids, otherwise normal rectum/Sigmoid diverticula.  Remainder of colon mucosa appeared normal  . COLONOSCOPY WITH  ESOPHAGOGASTRODUODENOSCOPY (EGD) N/A 06/24/2013   KGU:RKYHCWC polyp-removed. Small hiatal hernia. Abnormal gastric mucosa-query NSAID effect-s/p (chronic inactive gastritis, negative H. pylori)/TCS: Prominent internal hemorrhoids-likely source of hematochezia. Colonic diverticulosis. Colonic polyp-removed (tubular adenoma)  . COLONOSCOPY WITH PROPOFOL N/A 11/01/2018   Procedure: COLONOSCOPY WITH PROPOFOL;  Surgeon: Daneil Dolin, MD;  Location: [facility] ENDO SUITE;  Service: Endoscopy;  Laterality: N/A;  7:30am  . CYSTOCELE REPAIR  2010  . DILATION AND CURETTAGE OF UTERUS    . KNEE SURGERY Right 2003   arthroscopic left  . RECTAL SURGERY    . RECTOCELE REPAIR  2010  . TONSILLECTOMY       MEDICATIONS:   Facility-Administered Medications Prior to Admission  Medication Dose Route Frequency Provider Last Rate Last Dose  . denosumab (PROLIA) injection 60 mg  60 mg Subcutaneous Q6 months Susy Frizzle, MD   60 mg at 02/23/19 0901   Medications Prior to Admission  Medication Sig Dispense Refill Last Dose  . ALPRAZolam (XANAX) 0.5 MG tablet TAKE 1 TABLET BY MOUTH EVERY NIGHT AT BEDTIME (Patient taking differently: Take 0.5 mg by mouth at bedtime. ) 30 tablet 1 05/08/2019 at Unknown time  . aspirin EC 81 MG tablet Take 81 mg by mouth at bedtime.   1+ week ago  . Calcium-Magnesium-Vitamin D (CALCIUM 1200+D3 PO) Take 1 tablet by mouth daily.   1+ week ago  . Carboxymethylcellul-Glycerin (LUBRICATING EYE DROPS OP) Place 1 drop into both eyes 2 (two) times daily.   05/09/2019 at  0430  . famotidine (PEPCID) 40 MG tablet Take 1 tablet (40 mg total) by mouth at bedtime. 90 tablet 3 05/08/2019 at Unknown time  . fluticasone (FLONASE) 50 MCG/ACT nasal spray Use 2 sprays in each  nostril once daily (Patient taking differently: Place 2 sprays into both nostrils at bedtime. ) 48 g 3 05/08/2019 at Unknown time  . levocetirizine (XYZAL) 5 MG tablet Take 5 mg by mouth daily.   05/09/2019 at 0430  . losartan (COZAAR) 100  MG tablet TAKE ONE TABLET (100MG  TOTAL) BY MOUTH DAILY (Patient taking differently: Take 100 mg by mouth daily. ) 90 tablet 3 05/08/2019 at Unknown time  . MELATONIN ER PO Take by mouth.   1+ week ago  . Misc Natural Products (GLUCOSAMINE CHONDROITIN TRIPLE) TABS Take 1 tablet by mouth 2 (two) times daily.   1+ week ago  . Multiple Vitamin (MULTIVITAMIN) tablet Take 1 tablet by mouth daily.   1+ week ago  . MYRBETRIQ 50 MG TB24 tablet Take 50 mg by mouth daily.    05/09/2019 at 0430  . Omega-3 Fatty Acids (FISH OIL) 1200 MG CAPS Take 1,200 mg by mouth 2 (two) times daily.    1+ week ago  . pantoprazole (PROTONIX) 40 MG tablet TAKE ONE TABLET BY MOUTH TWICE DAILY BEFORE MEALS (Patient taking differently: Take 40 mg by mouth 2 (two) times daily before a meal. ) 180 tablet 2 05/09/2019 at 0430  . pravastatin (PRAVACHOL) 40 MG tablet TAKE ONE (1) TABLET BY MOUTH EVERY DAY (Patient taking differently: Take 40 mg by mouth at bedtime. ) 90 tablet 1 05/08/2019 at Unknown time  . rOPINIRole (REQUIP) 2 MG tablet TAKE ONE TABLET BY MOUTH EVERY NIGHT AT BEDTIME (Patient taking differently: Take 2 mg by mouth at bedtime. ) 90 tablet 3 05/08/2019 at Unknown time  . traMADol (ULTRAM) 50 MG tablet TAKE ONE (1) TABLET BY MOUTH 3 TIMES DAILY AS NEEDED (Patient taking differently: Take 50 mg by mouth 3 (three) times daily as needed for moderate pain. ) 90 tablet 2 05/08/2019 at Unknown time  . Black Pepper-Turmeric (TURMERIC COMPLEX/BLACK PEPPER PO) Take 1 tablet by mouth daily.   1+ week ago  . denosumab (PROLIA) 60 MG/ML SOSY injection Inject 60 mg into the skin every 6 (six) months.   More than a month at Unknown time    ALLERGIES:   Allergies  Allergen Reactions  . Other Other (See Comments)    Arthritis medications- causes a bacteria in the abdomen    REVIEW OF SYSTEMS:  A comprehensive review of systems was negative except for: Musculoskeletal: positive for arthralgias and bone pain   FAMILY HISTORY:   Family  History  Problem Relation Age of Onset  . Cancer Mother        Breast  . Hypertension Father   . Heart attack Father   . Cancer Sister        Breast  . Cancer Sister        breast  . Breast cancer Sister   . Colon cancer Neg Hx     SOCIAL HISTORY:   Social History   Tobacco Use  . Smoking status: Never Smoker  . Smokeless tobacco: Never Used  Substance Use Topics  . Alcohol use: No     EXAMINATION:  Vital signs in last 24 hours: Temp:  [98.1 F (36.7 C)] 98.1 F (36.7 C) (08/17 0616) Pulse Rate:  [82] 82 (08/17 0616) Resp:  [16] 16 (08/17 0616) BP: (  144)/(81) 144/81 (08/17 0616) SpO2:  [99 %] 99 % (08/17 0616) Weight:  [75.8 kg] 75.8 kg (08/17 0627)  BP (!) 144/81   Pulse 82   Temp 98.1 F (36.7 C) (Oral)   Resp 16   Ht 5' 5.5" (1.664 m)   Wt 75.8 kg   SpO2 99%   BMI 27.37 kg/m   General Appearance:    Alert, cooperative, no distress, appears stated age  Head:    Normocephalic, without obvious abnormality, atraumatic  Eyes:    PERRL, conjunctiva/corneas clear, EOM's intact, fundi    benign, both eyes  Ears:    Normal TM's and external ear canals, both ears  Nose:   Nares normal, septum midline, mucosa normal, no drainage    or sinus tenderness  Throat:   Lips, mucosa, and tongue normal; teeth and gums normal  Neck:   Supple, symmetrical, trachea midline, no adenopathy;    thyroid:  no enlargement/tenderness/nodules; no carotid   bruit or JVD  Back:     Symmetric, no curvature, ROM normal, no CVA tenderness  Lungs:     Clear to auscultation bilaterally, respirations unlabored  Chest Wall:    No tenderness or deformity   Heart:    Regular rate and rhythm, S1 and S2 normal, no murmur, rub   or gallop  Breast Exam:    No tenderness, masses, or nipple abnormality  Abdomen:     Soft, non-tender, bowel sounds active all four quadrants,    no masses, no organomegaly  Genitalia:    Normal female without lesion, discharge or tenderness  Rectal:    Normal tone,  no masses or tenderness;   guaiac negative stool  Extremities:   Extremities normal, atraumatic, no cyanosis or edema  Pulses:   2+ and symmetric all extremities  Skin:   Skin color, texture, turgor normal, no rashes or lesions  Lymph nodes:   Cervical, supraclavicular, and axillary nodes normal  Neurologic:   CNII-XII intact, normal strength, sensation and reflexes    throughout    Musculoskeletal:  ROM 0-120, Ligaments intact,  Imaging Review Plain radiographs demonstrate severe degenerative joint disease of the left knee. The overall alignment is neutral. The bone quality appears to be good for age and reported activity level.  Assessment/Plan: Primary osteoarthritis, left knee   The patient history, physical examination and imaging studies are consistent with advanced degenerative joint disease of the left knee. The patient has failed conservative treatment.  The clearance notes were reviewed.  After discussion with the patient it was felt that Total Knee Replacement was indicated. The procedure,  risks, and benefits of total knee arthroplasty were presented and reviewed. The risks including but not limited to aseptic loosening, infection, blood clots, vascular injury, stiffness, patella tracking problems complications among others were discussed. The patient acknowledged the explanation, agreed to proceed with the plan.  Preoperative templating of the joint replacement has been completed, documented, and submitted to the Operating Room personnel in order to optimize intra-operative equipment management.    Patient's anticipated LOS is less than 2 midnights, meeting these requirements: - Lives within 1 hour of care - Has a competent adult at home to recover with post-op recover - NO history of  - Chronic pain requiring opiods  - Diabetes  - Coronary Artery Disease  - Heart failure  - Heart attack  - Stroke  - DVT/VTE  - Cardiac arrhythmia  - Respiratory Failure/COPD  - Renal  failure  - Anemia  - Advanced  Liver disease       Donia Ast 05/09/2019, 6:30 AM

## 2019-05-09 NOTE — Anesthesia Procedure Notes (Signed)
Anesthesia Regional Block: Adductor canal block   Pre-Anesthetic Checklist: ,, timeout performed, Correct Patient, Correct Site, Correct Laterality, Correct Procedure, Correct Position, site marked, Risks and benefits discussed,  Surgical consent,  Pre-op evaluation,  At surgeon's request and post-op pain management  Laterality: Right and Lower  Prep: chloraprep       Needles:  Injection technique: Single-shot  Needle Type: Echogenic Needle     Needle Length: 9cm  Needle Gauge: 21     Additional Needles:   Procedures:,,,, ultrasound used (permanent image in chart),,,,  Narrative:  Start time: 05/09/2019 7:36 AM End time: 05/09/2019 7:42 AM Injection made incrementally with aspirations every 5 mL.  Performed by: Personally  Anesthesiologist: Annye Asa, MD  Additional Notes: Pt identified in Holding room.  Monitors applied. Working IV access confirmed. Sterile prep R thigh.  #21ga ECHOgenic needle into adductor canal with US guidance.  20cc 0.75% Ropivacaine injected incrementally after negative test dose.  Patient asymptomatic, VSS, no heme aspirated, tolerated well.  Jenita Seashore, MD

## 2019-05-09 NOTE — Plan of Care (Signed)
Continue POC

## 2019-05-09 NOTE — Care Plan (Signed)
Ortho Bundle Case Management Note  Patient Details  Name: Allison Freeman MRN: [id number] Date of Birth: [birth year]                     DME Arranged:  Continuous passive motion machine, Walker rolling DME Agency:  [employer]  HH Arranged:  PT [place] Agency:  [facility]  Additional Comments: Please contact me with any questions of if this plan should need to change.  Myer Haff, [facility] [place] 05/09/2019, 10:54 AM

## 2019-05-09 NOTE — Op Note (Signed)
TOTAL KNEE REPLACEMENT OPERATIVE NOTE:  05/09/2019  12:56 PM  PATIENT:  Allison Freeman  73 y.o. female  PRE-OPERATIVE DIAGNOSIS:  Primary Osteoarthritis Right Knee  POST-OPERATIVE DIAGNOSIS:  Primary Osteoarthritis Right Knee  PROCEDURE:  Procedure(s): TOTAL KNEE ARTHROPLASTY  SURGEON:  Surgeon(s): Vickey Huger, MD  PHYSICIAN ASSISTANT: [name], PA-C   ANESTHESIA:   spinal  SPECIMEN: None  COUNTS:  Correct  TOURNIQUET:   Total Tourniquet Time Documented: Thigh (Right) - 41 minutes Total: Thigh (Right) - 41 minutes   DICTATION:  Indication for procedure:    The patient is a 73 y.o. female who has failed conservative treatment for Primary Osteoarthritis Right Knee.  Informed consent was obtained prior to anesthesia. The risks versus benefits of the operation were explain and in a way the patient can, and did, understand.    Description of procedure:     The patient was taken to the operating room and placed under anesthesia.  The patient was positioned in the usual fashion taking care that all body parts were adequately padded and/or protected.  A tourniquet was applied and the leg prepped and draped in the usual sterile fashion.  The extremity was exsanguinated with the esmarch and tourniquet inflated to 350 mmHg.  Pre-operative range of motion was normal.   A midline incision approximately 6-7 inches long was made with a #10 blade.  A new blade was used to make a parapatellar arthrotomy going 2-3 cm into the quadriceps tendon, over the patella, and alongside the medial aspect of the patellar tendon.  A synovectomy was then performed with the #10 blade and forceps. I then elevated the deep MCL off the medial tibial metaphysis subperiosteally around to the semimembranosus attachment.    I everted the patella and used calipers to measure patellar thickness.  I used the reamer to ream down to appropriate thickness to recreate the native thickness.  I then removed  excess bone with the rongeur and sagittal saw.  I used the appropriately sized template and drilled the three lug holes.  I then put the trial in place and measured the thickness with the calipers to ensure recreation of the native thickness.  The trial was then removed and the patella subluxed and the knee brought into flexion.  A homan retractor was place to retract and protect the patella and lateral structures.  A Z-retractor was place medially to protect the medial structures.  The extra-medullary alignment system was used to make cut the tibial articular surface perpendicular to the anamotic axis of the tibia and in 3 degrees of posterior slope.  The cut surface and alignment jig was removed.  I then used the intramedullary alignment guide to make a valgus cut on the distal femur.  I then marked out the epicondylar axis on the distal femur.  I then used the anterior referencing sizer and measured the femur to be a size 6.  The 4-In-1 cutting block was screwed into place in external rotation matching the posterior condylar angle, making our cuts perpendicular to the epicondylar axis.  Anterior, posterior and chamfer cuts were made with the sagittal saw.  The cutting block and cut pieces were removed.  A lamina spreader was placed in 90 degrees of flexion.  The ACL, PCL, menisci, and posterior condylar osteophytes were removed.  A 11 mm spacer blocked was found to offer good flexion and extension gap balance after minimal in degree releasing.   The scoop retractor was then placed and the femoral finishing  block was pinned in place.  The small sagittal saw was used as well as the lug drill to finish the femur.  The block and cut surfaces were removed and the medullary canal hole filled with autograft bone from the cut pieces.  The tibia was delivered forward in deep flexion and external rotation.  A size D tray was selected and pinned into place centered on the medial 1/3 of the tibial tubercle.  The  reamer and keel was used to prepare the tibia through the tray.    I then trialed with the size 6 femur, size D tibia, a 11 mm insert and the 32 patella.  I had excellent flexion/extension gap balance, excellent patella tracking.  Flexion was full and beyond 120 degrees; extension was zero.  These components were chosen and the staff opened them to me on the back table while the knee was lavaged copiously and the cement mixed.  The soft tissue was infiltrated with 60cc of exparel 1.3% through a 21 gauge needle.  I cemented in the components and removed all excess cement.  The polyethylene tibial component was snapped into place and the knee placed in extension while cement was hardening.  The capsule was infilltrated with a 60cc exparel/marcaine/saline mixture.   Once the cement was hard, the tourniquet was let down.  Hemostasis was obtained.  The arthrotomy was closed using a #1 stratofix running suture.  The deep soft tissues were closed with #0 vicryls and the subcuticular layer closed with #2-0 vicryl.  The skin was reapproximated and closed with 3.0 Monocryl.  The wound was covered with steristrips, aquacel dressing, and a TED stocking.   The patient was then awakened, extubated, and taken to the recovery room in stable condition.  BLOOD LOSS:  948NI COMPLICATIONS:  None.  PLAN OF CARE: Admit for overnight observation  PATIENT DISPOSITION:  PACU - hemodynamically stable.     Please fax a copy of this op note to my office at [phone] (please only include page 1 and 2 of the Case Information op note)

## 2019-05-09 NOTE — Progress Notes (Signed)
AssistedDr. Carswell Jackson with right, ultrasound guided, adductor canal block. Side rails up, monitors on throughout procedure. See vital signs in flow sheet. Tolerated Procedure well.  

## 2019-05-09 NOTE — Transfer of Care (Signed)
Immediate Anesthesia Transfer of Care Note  Patient: Allison Freeman  Procedure(s) Performed: TOTAL KNEE ARTHROPLASTY (Right Knee)  Patient Location: PACU  Anesthesia Type:Spinal  Level of Consciousness: awake, alert  and oriented  Airway & Oxygen Therapy: Patient Spontanous Breathing and Patient connected to face mask oxygen  Post-op Assessment: Report given to RN and Post -op Vital signs reviewed and stable  Post vital signs: Reviewed and stable  Last Vitals:  Vitals Value Taken Time  BP 132/69 05/09/19 1010  Temp    Pulse 78 05/09/19 1011  Resp 17 05/09/19 1011  SpO2 100 % 05/09/19 1011  Vitals shown include unvalidated device data.  Last Pain:  Vitals:   05/09/19 0733  TempSrc:   PainSc: 0-No pain      Patients Stated Pain Goal: 0 (58/30/94 0768)  Complications: No apparent anesthesia complications

## 2019-05-09 NOTE — Anesthesia Procedure Notes (Signed)
Spinal  Start time: 05/09/2019 8:10 AM End time: 05/09/2019 8:14 AM Staffing Resident/CRNA: Gean Maidens, CRNA Performed: resident/CRNA  Preanesthetic Checklist Completed: patient identified, site marked, surgical consent, pre-op evaluation, timeout performed, IV checked, risks and benefits discussed and monitors and equipment checked Spinal Block Patient position: sitting Prep: DuraPrep Patient monitoring: heart rate, continuous pulse ox and blood pressure Approach: midline Location: L3-4 Injection technique: single-shot Needle Needle type: Pencan  Needle gauge: 24 G Needle length: 9 cm Needle insertion depth: 6 cm Additional Notes Pt sitting position, sterile prep and drape, negative paresthesia

## 2019-05-10 ENCOUNTER — Encounter (HOSPITAL_COMMUNITY): Payer: Self-pay | Admitting: Orthopedic Surgery

## 2019-05-10 DIAGNOSIS — M810 Age-related osteoporosis without current pathological fracture: Secondary | ICD-10-CM | POA: Diagnosis not present

## 2019-05-10 DIAGNOSIS — M1711 Unilateral primary osteoarthritis, right knee: Secondary | ICD-10-CM | POA: Diagnosis not present

## 2019-05-10 DIAGNOSIS — E7800 Pure hypercholesterolemia, unspecified: Secondary | ICD-10-CM | POA: Diagnosis not present

## 2019-05-10 DIAGNOSIS — K219 Gastro-esophageal reflux disease without esophagitis: Secondary | ICD-10-CM | POA: Diagnosis not present

## 2019-05-10 DIAGNOSIS — F329 Major depressive disorder, single episode, unspecified: Secondary | ICD-10-CM | POA: Diagnosis not present

## 2019-05-10 DIAGNOSIS — I10 Essential (primary) hypertension: Secondary | ICD-10-CM | POA: Diagnosis not present

## 2019-05-10 LAB — CBC
HCT: 31.6 % — ABNORMAL LOW (ref 36.0–46.0)
Hemoglobin: 10.3 g/dL — ABNORMAL LOW (ref 12.0–15.0)
MCH: 30.4 pg (ref 26.0–34.0)
MCHC: 32.6 g/dL (ref 30.0–36.0)
MCV: 93.2 fL (ref 80.0–100.0)
Platelets: 195 10*3/uL (ref 150–400)
RBC: 3.39 MIL/uL — ABNORMAL LOW (ref 3.87–5.11)
RDW: 12.9 % (ref 11.5–15.5)
WBC: 11.1 10*3/uL — ABNORMAL HIGH (ref 4.0–10.5)
nRBC: 0 % (ref 0.0–0.2)

## 2019-05-10 LAB — BASIC METABOLIC PANEL
Anion gap: 5 (ref 5–15)
BUN: 18 mg/dL (ref 8–23)
CO2: 22 mmol/L (ref 22–32)
Calcium: 7.6 mg/dL — ABNORMAL LOW (ref 8.9–10.3)
Chloride: 112 mmol/L — ABNORMAL HIGH (ref 98–111)
Creatinine, Ser: 0.77 mg/dL (ref 0.44–1.00)
GFR calc Af Amer: >60 mL/min (ref >60)
GFR calc non Af Amer: >60 mL/min (ref >60)
Glucose, Bld: 111 mg/dL — ABNORMAL HIGH (ref 70–99)
Potassium: 4.2 mmol/L (ref 3.5–5.1)
Sodium: 139 mmol/L (ref 135–145)

## 2019-05-10 MED ORDER — OXYCODONE HCL 5 MG PO TABS: 5.0000 mg | ORAL_TABLET | Freq: Four times a day (QID) | ORAL | 0 refills | Status: DC | PRN

## 2019-05-10 MED ORDER — ASPIRIN 325 MG PO TBEC: 325.0000 mg | DELAYED_RELEASE_TABLET | Freq: Two times a day (BID) | ORAL | 0 refills | Status: DC

## 2019-05-10 MED ORDER — METHOCARBAMOL 500 MG PO TABS: 500.0000 mg | ORAL_TABLET | Freq: Four times a day (QID) | ORAL | 0 refills | Status: DC | PRN

## 2019-05-10 NOTE — Progress Notes (Signed)
Physical Therapy Treatment Patient Details Name: Allison Freeman MRN: [id number] DOB: 29-Dec-1945 Today's Date: 05/10/2019    History of Present Illness R TKA PMHx: panic attacks, CTS right wrist    PT Comments    Pt progressing well. Will see for a second session to review HEP after pain meds have taken effect   Follow Up Recommendations  Follow surgeon's recommendation for DC plan and follow-up therapies     Equipment Recommendations  Rolling walker with 5" wheels    Recommendations for Other Services       Precautions / Restrictions Precautions Precautions: Fall;Knee Restrictions Weight Bearing Restrictions: No RLE Weight Bearing: Weight bearing as tolerated    Mobility  Bed Mobility Overal bed mobility: Needs Assistance Bed Mobility: Supine to Sit     Supine to sit: Min guard;Min assist     General bed mobility comments: assist with RLE  Transfers Overall transfer level: Needs assistance Equipment used: Rolling walker (2 wheeled) Transfers: Sit to/from Stand Sit to Stand: Min guard;Supervision         General transfer comment: cues for hand placement and RLE position  Ambulation/Gait Ambulation/Gait assistance: Supervision;Min guard Gait Distance (Feet): 100 Feet Assistive device: Rolling walker (2 wheeled) Gait Pattern/deviations: Step-to pattern;Decreased stance time - right     General Gait Details: cues for sequence and RW position   Stairs Stairs: Yes   Stair Management: Step to pattern;Forwards;With walker;No rails Number of Stairs: 3 General stair comments: cues for sequence and technique   Wheelchair Mobility    Modified Rankin (Stroke Patients Only)       Balance                                            Cognition Arousal/Alertness: Awake/alert Behavior During Therapy: WFL for tasks assessed/performed Overall Cognitive Status: Within Functional Limits for tasks assessed                                        Exercises Total Joint Exercises Ankle Circles/Pumps: AROM;10 reps;Both Quad Sets: AROM;Both;10 reps    General Comments        Pertinent Vitals/Pain Pain Assessment: 0-10 Pain Score: 5  Pain Location: right knee Pain Descriptors / Indicators: Grimacing;Sore Pain Intervention(s): Limited activity within patient's tolerance;Monitored during session    Home Living                      Prior Function            PT Goals (current goals can now be found in the care plan section) Acute Rehab PT Goals Time For Goal Achievement: 05/16/19 Progress towards PT goals: Progressing toward goals    Frequency    7X/week      PT Plan Current plan remains appropriate    Co-evaluation              AM-PAC PT "6 Clicks" Mobility   Outcome Measure  Help needed turning from your back to your side while in a flat bed without using bedrails?: A Little Help needed moving from lying on your back to sitting on the side of a flat bed without using bedrails?: A Little Help needed moving to and from a bed to a chair (including a wheelchair)?: A Little Help  needed standing up from a chair using your arms (e.g., wheelchair or bedside chair)?: A Little Help needed to walk in hospital room?: A Little Help needed climbing 3-5 steps with a railing? : A Little 6 Click Score: 18    End of Session Equipment Utilized During Treatment: Gait belt Activity Tolerance: Patient tolerated treatment well Patient left: in chair;in CPM;with chair alarm set   PT Visit Diagnosis: Difficulty in walking, not elsewhere classified (R26.2)     Time: 7800-4471 PT Time Calculation (min) (ACUTE ONLY): 18 min  Charges:  $Gait Training: 8-22 mins                     Kenyon Ana, PT  Pager: [phone] Acute Rehab Dept [facility]): [phone]   05/10/2019    [facility] 05/10/2019, 9:59 AM

## 2019-05-10 NOTE — TOC Transition Note (Signed)
Transition of Care [facility]) - CM/SW Discharge Note   Patient Details  Name: Allison Freeman MRN: [id number] Date of Birth: [birth year]  Transition of Care [facility]) CM/SW Contact:  Leeroy Cha, RN Phone Number: 05/10/2019, 8:20 AM   Clinical Narrative:    Home with [facility] through [facility] and equipment   Final next level of care: [place] Barriers to Discharge: No Barriers Identified   Patient Goals and CMS Choice Patient states their goals for this hospitalization and ongoing recovery are:: just tog o home CMS Medicare.gov Compare Post Acute Care list provided to:: Patient Choice offered to / list presented to : Patient  Discharge Placement                       Discharge Plan and Services   Discharge Planning Services: CM Consult Post Acute Care Choice: Durable Medical Equipment, Home Health          DME Arranged: Walker rolling, 3-N-1 DME Agency: [employer] Date DME Agency Contacted: 05/09/19 Time DME Agency Contacted: [phone] Representative spoke with at DME Agency: [place]: PT [place]: [facility] [employer]) Date [place]: 05/10/19 Time [place]: 0820 Representative spoke with at [place]: [place] ([place]) Interventions     Readmission Risk Interventions No flowsheet data found.

## 2019-05-10 NOTE — Progress Notes (Signed)
SPORTS MEDICINE AND JOINT REPLACEMENT  [name], MD    [name], PA-C [place], [place], [place]  [address]                             [phone]   PROGRESS NOTE  Subjective:  negative for Chest Pain  negative for Shortness of Breath  negative for Nausea/Vomiting   negative for Calf Pain  negative for Bowel Movement   Tolerating Diet: yes         Patient reports pain as 4 on 0-10 scale.    Objective: Vital signs in last 24 hours:    Patient Vitals for the past 24 hrs:  BP Temp Temp src Pulse Resp SpO2  05/10/19 0459 (!) 143/83 97.6 F (36.4 C) Oral 70 16 97 %  05/10/19 0119 128/70 (!) 97.5 F (36.4 C) Oral 66 16 95 %  05/09/19 2119 129/69 - - 72 16 96 %  05/09/19 1648 (!) 124/92 97.7 F (36.5 C) Oral 76 16 97 %  05/09/19 1318 138/77 98 F (36.7 C) - 74 16 100 %  05/09/19 1121 135/72 (!) 97.4 F (36.3 C) Oral 73 17 100 %  05/09/19 1100 128/70 (!) 97.5 F (36.4 C) - 64 10 99 %  05/09/19 1045 (!) 107/59 - - 67 13 99 %  05/09/19 1030 - - - 63 12 100 %  05/09/19 1015 126/81 - - 72 17 100 %  05/09/19 1010 132/69 (!) 97.4 F (36.3 C) - 75 16 100 %  05/09/19 0800 128/74 - - 66 17 100 %  05/09/19 0750 - - - 63 15 99 %    @flow {1959:LAST@   Intake/Output from previous day:   08/17 0701 - 08/18 0700 In: 4252.7 [P.O.:300; I.V.:3604.1] Out: 3625 [Urine:3600]   Intake/Output this shift:   No intake/output data recorded.   Intake/Output      08/17 0701 - 08/18 0700 08/18 0701 - 08/19 0700   P.O. 300    I.V. (mL/kg) 3604.1 (47.5)    IV Piggyback 348.6    Total Intake(mL/kg) 4252.7 (56.1)    Urine (mL/kg/hr) 3600 (2)    Stool 0    Blood 25    Total Output 3625    Net +627.7         Stool Occurrence 0 x       LABORATORY DATA: Recent Labs    05/05/19 1555 05/10/19 0242  WBC 5.5 11.1*  HGB 11.2* 10.3*  HCT 35.2* 31.6*  PLT 220 195   Recent Labs    05/05/19 1555 05/10/19 0242  NA 140 139  K 4.4 4.2  CL 109 112*  CO2 25 22  BUN  20 18  CREATININE 0.87 0.77  GLUCOSE 111* 111*  CALCIUM 8.5* 7.6*   No results found for: INR, PROTIME  Examination:  General appearance: alert, cooperative and no distress Extremities: extremities normal, atraumatic, no cyanosis or edema  Wound Exam: clean, dry, intact   Drainage:  Scant/small amount Bloody exudate  Motor Exam: Quadriceps and Hamstrings Intact  Sensory Exam: Superficial Peroneal, Deep Peroneal and Tibial normal   Assessment:    1 Day Post-Op  Procedure(s) (LRB): TOTAL KNEE ARTHROPLASTY (Right)  ADDITIONAL DIAGNOSIS:  Active Problems:   S/P total knee replacement     Plan: Physical Therapy as ordered Weight Bearing as Tolerated (WBAT)  DVT Prophylaxis:  Aspirin  DISCHARGE PLAN: Home    Patient's anticipated LOS  is less than 2 midnights, meeting these requirements: - Lives within 1 hour of care - Has a competent adult at home to recover with post-op recover - NO history of  - Chronic pain requiring opiods  - Diabetes  - Coronary Artery Disease  - Heart failure  - Heart attack  - Stroke  - DVT/VTE  - Cardiac arrhythmia  - Respiratory Failure/COPD  - Renal failure  - Anemia  - Advanced Liver disease        Donia Ast 05/10/2019, 7:47 AM

## 2019-05-10 NOTE — Progress Notes (Signed)
   05/10/19 1400  PT Visit Information  Last PT Received On 05/10/19  Reviewed HEP. Pt progressing well with knee ROM. Ready for d/c  Assistance Needed +1  History of Present Illness R TKA PMHx: panic attacks, CTS right wrist  Precautions  Precautions Fall;Knee  Restrictions  Weight Bearing Restrictions No  RLE Weight Bearing WBAT  Pain Assessment  Pain Assessment 0-10  Pain Score 4  Pain Location right knee  Pain Descriptors / Indicators Grimacing;Sore  Pain Intervention(s) Limited activity within patient's tolerance;Monitored during session;Patient requesting pain meds-RN notified  Cognition  Arousal/Alertness Awake/alert  Behavior During Therapy WFL for tasks assessed/performed  Overall Cognitive Status Within Functional Limits for tasks assessed  Total Joint Exercises  Ankle Circles/Pumps AROM;10 reps;Both  Quad Sets AROM;Both;10 reps  Short [place] [employer];Right;10 reps  Heel Slides AAROM;AROM;10 reps;Right  Hip ABduction/ADduction AROM;Right;10 reps;AAROM  Straight Leg Raises AROM;AAROM;Right;10 reps  Goniometric ROM grossly 5 to 80 degrees  Knee Flexion AROM;AAROM;Right;Seated  PT - End of Session  Activity Tolerance Patient tolerated treatment well  Patient left in chair;with call bell/phone within reach;with chair alarm set   PT - Assessment/Plan  PT Plan Current plan remains appropriate  PT Visit Diagnosis Difficulty in walking, not elsewhere classified (R26.2)  PT Frequency (ACUTE ONLY) 7X/week  Follow Up Recommendations Follow surgeon's recommendation for DC plan and follow-up therapies  PT equipment Rolling walker with 5" wheels  AM-PAC PT "6 Clicks" Mobility Outcome Measure (Version 2)  Help needed turning from your back to your side while in a flat bed without using bedrails? 3  Help needed moving from lying on your back to sitting on the side of a flat bed without using bedrails? 3  Help needed moving to and from a bed to a chair (including a  wheelchair)? 3  Help needed standing up from a chair using your arms (e.g., wheelchair or bedside chair)? 3  Help needed to walk in hospital room? 3  Help needed climbing 3-5 steps with a railing?  3  6 Click Score 18  Consider Recommendation of Discharge To: Home with [facility]  PT Goal Progression  Progress towards PT goals Progressing toward goals  Acute Rehab PT Goals  Time For Goal Achievement 05/16/19  PT Time Calculation  PT Start Time (ACUTE ONLY) 1349  PT Stop Time (ACUTE ONLY) 1406  PT Time Calculation (min) (ACUTE ONLY) 17 min  PT General Charges  $$ ACUTE PT VISIT 1 Visit  PT Treatments  $Therapeutic Exercise 8-22 mins

## 2019-05-11 DIAGNOSIS — Z96651 Presence of right artificial knee joint: Secondary | ICD-10-CM | POA: Diagnosis not present

## 2019-05-11 DIAGNOSIS — G2581 Restless legs syndrome: Secondary | ICD-10-CM | POA: Diagnosis not present

## 2019-05-11 DIAGNOSIS — K219 Gastro-esophageal reflux disease without esophagitis: Secondary | ICD-10-CM | POA: Diagnosis not present

## 2019-05-11 DIAGNOSIS — G4700 Insomnia, unspecified: Secondary | ICD-10-CM | POA: Diagnosis not present

## 2019-05-11 DIAGNOSIS — I10 Essential (primary) hypertension: Secondary | ICD-10-CM | POA: Diagnosis not present

## 2019-05-11 DIAGNOSIS — Z86010 Personal history of colonic polyps: Secondary | ICD-10-CM | POA: Diagnosis not present

## 2019-05-11 DIAGNOSIS — M810 Age-related osteoporosis without current pathological fracture: Secondary | ICD-10-CM | POA: Diagnosis not present

## 2019-05-11 DIAGNOSIS — Z471 Aftercare following joint replacement surgery: Secondary | ICD-10-CM | POA: Diagnosis not present

## 2019-05-11 DIAGNOSIS — Z7951 Long term (current) use of inhaled steroids: Secondary | ICD-10-CM | POA: Diagnosis not present

## 2019-05-11 DIAGNOSIS — E7849 Other hyperlipidemia: Secondary | ICD-10-CM | POA: Diagnosis not present

## 2019-05-11 DIAGNOSIS — F410 Panic disorder [episodic paroxysmal anxiety] without agoraphobia: Secondary | ICD-10-CM | POA: Diagnosis not present

## 2019-05-11 DIAGNOSIS — Z9181 History of falling: Secondary | ICD-10-CM | POA: Diagnosis not present

## 2019-05-11 DIAGNOSIS — Z7982 Long term (current) use of aspirin: Secondary | ICD-10-CM | POA: Diagnosis not present

## 2019-05-11 DIAGNOSIS — F329 Major depressive disorder, single episode, unspecified: Secondary | ICD-10-CM | POA: Diagnosis not present

## 2019-05-12 ENCOUNTER — Other Ambulatory Visit: Payer: Medicare Other

## 2019-05-12 ENCOUNTER — Encounter: Payer: Self-pay | Admitting: Family Medicine

## 2019-05-12 ENCOUNTER — Telehealth: Payer: Self-pay | Admitting: *Deleted

## 2019-05-12 ENCOUNTER — Other Ambulatory Visit: Payer: Self-pay

## 2019-05-12 ENCOUNTER — Other Ambulatory Visit: Payer: Self-pay | Admitting: Family Medicine

## 2019-05-12 DIAGNOSIS — K219 Gastro-esophageal reflux disease without esophagitis: Secondary | ICD-10-CM | POA: Diagnosis not present

## 2019-05-12 DIAGNOSIS — E7849 Other hyperlipidemia: Secondary | ICD-10-CM | POA: Diagnosis not present

## 2019-05-12 DIAGNOSIS — M810 Age-related osteoporosis without current pathological fracture: Secondary | ICD-10-CM | POA: Diagnosis not present

## 2019-05-12 DIAGNOSIS — F329 Major depressive disorder, single episode, unspecified: Secondary | ICD-10-CM | POA: Diagnosis not present

## 2019-05-12 DIAGNOSIS — R300 Dysuria: Secondary | ICD-10-CM | POA: Diagnosis not present

## 2019-05-12 DIAGNOSIS — I10 Essential (primary) hypertension: Secondary | ICD-10-CM | POA: Diagnosis not present

## 2019-05-12 DIAGNOSIS — Z471 Aftercare following joint replacement surgery: Secondary | ICD-10-CM | POA: Diagnosis not present

## 2019-05-12 LAB — URINALYSIS, ROUTINE W REFLEX MICROSCOPIC
Bacteria, UA: NONE SEEN /HPF
Bilirubin Urine: NEGATIVE
Glucose, UA: NEGATIVE
Hgb urine dipstick: NEGATIVE
Hyaline Cast: NONE SEEN /LPF
Leukocytes,Ua: NEGATIVE
Nitrite: NEGATIVE
RBC / HPF: NONE SEEN /HPF (ref 0–2)
Specific Gravity, Urine: 1.02 (ref 1.001–1.03)
WBC, UA: NONE SEEN /HPF (ref 0–5)
pH: 7 (ref 5.0–8.0)

## 2019-05-12 LAB — MICROSCOPIC MESSAGE

## 2019-05-12 MED ORDER — SULFAMETHOXAZOLE-TRIMETHOPRIM 800-160 MG PO TABS: 1.0000 | ORAL_TABLET | Freq: Two times a day (BID) | ORAL | 0 refills | Status: DC

## 2019-05-12 NOTE — Telephone Encounter (Signed)
Call placed to patient and patient made aware.  

## 2019-05-12 NOTE — Telephone Encounter (Signed)
Received call from patient.   Reports that she noted some blood in her urine, frequency and urgency on 05/11/2019.states that she had total knee replacement on 05/09/2019 and is unable to come to office.   Advised to have urine sample brought to office to send out for UA C/S.   Requested prescription for ABTx.  MD please advise.

## 2019-05-12 NOTE — Telephone Encounter (Signed)
I sent bactrim to her pharmacy

## 2019-05-13 DIAGNOSIS — E7849 Other hyperlipidemia: Secondary | ICD-10-CM | POA: Diagnosis not present

## 2019-05-13 DIAGNOSIS — K219 Gastro-esophageal reflux disease without esophagitis: Secondary | ICD-10-CM | POA: Diagnosis not present

## 2019-05-13 DIAGNOSIS — F329 Major depressive disorder, single episode, unspecified: Secondary | ICD-10-CM | POA: Diagnosis not present

## 2019-05-13 DIAGNOSIS — I10 Essential (primary) hypertension: Secondary | ICD-10-CM | POA: Diagnosis not present

## 2019-05-13 DIAGNOSIS — Z471 Aftercare following joint replacement surgery: Secondary | ICD-10-CM | POA: Diagnosis not present

## 2019-05-13 DIAGNOSIS — M810 Age-related osteoporosis without current pathological fracture: Secondary | ICD-10-CM | POA: Diagnosis not present

## 2019-05-13 LAB — URINE CULTURE
MICRO NUMBER:: 793273
SPECIMEN QUALITY:: ADEQUATE

## 2019-05-16 DIAGNOSIS — K219 Gastro-esophageal reflux disease without esophagitis: Secondary | ICD-10-CM | POA: Diagnosis not present

## 2019-05-16 DIAGNOSIS — I10 Essential (primary) hypertension: Secondary | ICD-10-CM | POA: Diagnosis not present

## 2019-05-16 DIAGNOSIS — E7849 Other hyperlipidemia: Secondary | ICD-10-CM | POA: Diagnosis not present

## 2019-05-16 DIAGNOSIS — Z471 Aftercare following joint replacement surgery: Secondary | ICD-10-CM | POA: Diagnosis not present

## 2019-05-16 DIAGNOSIS — M810 Age-related osteoporosis without current pathological fracture: Secondary | ICD-10-CM | POA: Diagnosis not present

## 2019-05-16 DIAGNOSIS — F329 Major depressive disorder, single episode, unspecified: Secondary | ICD-10-CM | POA: Diagnosis not present

## 2019-05-16 NOTE — Discharge Summary (Signed)
SPORTS MEDICINE & JOINT REPLACEMENT   Allison Mulch, MD   Allison Shadow, PA-C [place], [place], [place]  [address]                             [phone]  PATIENT ID: Allison Freeman        MRN:  [id number]          DOB/AGE: October 01, 1945 / 72 y.o.    DISCHARGE SUMMARY  ADMISSION DATE:    05/09/2019 DISCHARGE DATE:   05/10/2019  ADMISSION DIAGNOSIS: Primary Osteoarthritis Right Knee    DISCHARGE DIAGNOSIS:  Primary Osteoarthritis Right Knee    ADDITIONAL DIAGNOSIS: Active Problems:   S/P total knee replacement  Past Medical History:  Diagnosis Date  . Acid reflux   . Arthritis   . Depression   . Family history of adverse reaction to anesthesia    Mother woke up and could hearduring surgery  . Hypercholesterolemia   . Hypertension   . Insomnia   . Osteoporosis   . Panic attacks    panic attacks  . Reflux   . Restless leg syndrome     PROCEDURE: Procedure(s): TOTAL KNEE ARTHROPLASTY on 05/09/2019  CONSULTS:    HISTORY:  See H&P in chart  HOSPITAL COURSE:  Allison Freeman is a 73 y.o. admitted on 05/09/2019 and found to have a diagnosis of Primary Osteoarthritis Right Knee.  After appropriate laboratory studies were obtained  they were taken to the operating room on 05/09/2019 and underwent Procedure(s): TOTAL KNEE ARTHROPLASTY.   They were given perioperative antibiotics:  Anti-infectives (From admission, onward)   Start     Dose/Rate Route Frequency Ordered Stop   05/09/19 1500  ceFAZolin (ANCEF) IVPB 2g/100 mL premix     2 g 200 mL/hr over 30 Minutes Intravenous Every 6 hours 05/09/19 1117 05/09/19 2034   05/09/19 0600  ceFAZolin (ANCEF) IVPB 2g/100 mL premix     2 g 200 mL/hr over 30 Minutes Intravenous On call to O.R. 05/09/19 [id number] 05/09/19 0904    .  Patient given tranexamic acid IV or topical and exparel intra-operatively.  Tolerated the procedure well.    POD# 1: Vital signs were stable.  Patient denied Chest pain, shortness of breath,  or calf pain.  Patient was started on Aspirin twice daily at 8am.  Consults to PT, OT, and care management were made.  The patient was weight bearing as tolerated.  CPM was placed on the operative leg 0-90 degrees for 6-8 hours a day. When out of the CPM, patient was placed in the foam block to achieve full extension. Incentive spirometry was taught.  Dressing was changed.       POD #2, Continued  PT for ambulation and exercise program.  IV saline locked.  O2 discontinued.    The remainder of the hospital course was dedicated to ambulation and strengthening.   The patient was discharged on 1 day post op in  Good condition.  Blood products given:none  DIAGNOSTIC STUDIES: Recent vital signs: No data found.     Recent laboratory studies: Recent Labs    05/10/19 0242  WBC 11.1*  HGB 10.3*  HCT 31.6*  PLT 195   Recent Labs    05/10/19 0242  NA 139  K 4.2  CL 112*  CO2 22  BUN 18  CREATININE 0.77  GLUCOSE 111*  CALCIUM 7.6*   No results found for: INR, PROTIME  Recent Radiographic Studies :  Mm 3d Screen Breast Bilateral  Result Date: 04/25/2019 CLINICAL DATA:  Screening. EXAM: DIGITAL SCREENING BILATERAL MAMMOGRAM WITH TOMO AND CAD COMPARISON:  Previous exam(s). ACR Breast Density Category b: There are scattered areas of fibroglandular density. FINDINGS: There are no findings suspicious for malignancy. Images were processed with CAD. IMPRESSION: No mammographic evidence of malignancy. A result letter of this screening mammogram will be mailed directly to the patient. RECOMMENDATION: Screening mammogram in one year. (Code:[id number]-B-01Y) BI-RADS CATEGORY  1: Negative. The please Insert Correct Screening Template Electronically Signed   By: Allison Freeman [name]   On: 04/25/2019 20:49    DISCHARGE INSTRUCTIONS: Discharge Instructions    Call MD / Call 911   Complete by: As directed    If you experience chest pain or shortness of breath, CALL 911 and be transported to the hospital  emergency room.  If you develope a fever above 101 F, pus (white drainage) or increased drainage or redness at the wound, or calf pain, call your surgeon's office.   Constipation Prevention   Complete by: As directed    Drink plenty of fluids.  Prune juice may be helpful.  You may use a stool softener, such as Colace (over the counter) 100 mg twice a day.  Use MiraLax (over the counter) for constipation as needed.   Diet - low sodium heart healthy   Complete by: As directed    Discharge instructions   Complete by: As directed    INSTRUCTIONS AFTER JOINT REPLACEMENT   Remove items at home which could result in a fall. This includes throw rugs or furniture in walking pathways ICE to the affected joint every three hours while awake for 30 minutes at a time, for at least the first 3-5 days, and then as needed for pain and swelling.  Continue to use ice for pain and swelling. You may notice swelling that will progress down to the foot and ankle.  This is normal after surgery.  Elevate your leg when you are not up walking on it.   Continue to use the breathing machine you got in the hospital (incentive spirometer) which will help keep your temperature down.  It is common for your temperature to cycle up and down following surgery, especially at night when you are not up moving around and exerting yourself.  The breathing machine keeps your lungs expanded and your temperature down.   DIET:  As you were doing prior to hospitalization, we recommend a well-balanced diet.  DRESSING / WOUND CARE / SHOWERING  Keep the surgical dressing until follow up.  The dressing is water proof, so you can shower without any extra covering.  IF THE DRESSING FALLS OFF or the wound gets wet inside, change the dressing with sterile gauze.  Please use good hand washing techniques before changing the dressing.  Do not use any lotions or creams on the incision until instructed by your surgeon.    ACTIVITY  Increase activity  slowly as tolerated, but follow the weight bearing instructions below.   No driving for 6 weeks or until further direction given by your physician.  You cannot drive while taking narcotics.  No lifting or carrying greater than 10 lbs. until further directed by your surgeon. Avoid periods of inactivity such as sitting longer than an hour when not asleep. This helps prevent blood clots.  You may return to work once you are authorized by your doctor.     WEIGHT BEARING  Weight bearing as tolerated with assist device (walker, cane, etc) as directed, use it as long as suggested by your surgeon or therapist, typically at least 4-6 weeks.   EXERCISES  Results after joint replacement surgery are often greatly improved when you follow the exercise, range of motion and muscle strengthening exercises prescribed by your doctor. Safety measures are also important to protect the joint from further injury. Any time any of these exercises cause you to have increased pain or swelling, decrease what you are doing until you are comfortable again and then slowly increase them. If you have problems or questions, call your caregiver or physical therapist for advice.   Rehabilitation is important following a joint replacement. After just a few days of immobilization, the muscles of the leg can become weakened and shrink (atrophy).  These exercises are designed to build up the tone and strength of the thigh and leg muscles and to improve motion. Often times heat used for twenty to thirty minutes before working out will loosen up your tissues and help with improving the range of motion but do not use heat for the first two weeks following surgery (sometimes heat can increase post-operative swelling).   These exercises can be done on a training (exercise) mat, on the floor, on a table or on a bed. Use whatever works the best and is most comfortable for you.    Use music or television while you are exercising so that the  exercises are a pleasant break in your day. This will make your life better with the exercises acting as a break in your routine that you can look forward to.   Perform all exercises about fifteen times, three times per day or as directed.  You should exercise both the operative leg and the other leg as well.   Exercises include:   Quad Sets - Tighten up the muscle on the front of the thigh (Quad) and hold for 5-10 seconds.   Straight Leg Raises - With your knee straight (if you were given a brace, keep it on), lift the leg to 60 degrees, hold for 3 seconds, and slowly lower the leg.  Perform this exercise against resistance later as your leg gets stronger.  Leg Slides: Lying on your back, slowly slide your foot toward your buttocks, bending your knee up off the floor (only go as far as is comfortable). Then slowly slide your foot back down until your leg is flat on the floor again.  Angel Wings: Lying on your back spread your legs to the side as far apart as you can without causing discomfort.  Hamstring Strength:  Lying on your back, push your heel against the floor with your leg straight by tightening up the muscles of your buttocks.  Repeat, but this time bend your knee to a comfortable angle, and push your heel against the floor.  You may put a pillow under the heel to make it more comfortable if necessary.   A rehabilitation program following joint replacement surgery can speed recovery and prevent re-injury in the future due to weakened muscles. Contact your doctor or a physical therapist for more information on knee rehabilitation.    CONSTIPATION  Constipation is defined medically as fewer than three stools per week and severe constipation as less than one stool per week.  Even if you have a regular bowel pattern at home, your normal regimen is likely to be disrupted due to multiple reasons following surgery.  Combination of anesthesia, postoperative  narcotics, change in appetite and fluid  intake all can affect your bowels.   YOU MUST use at least one of the following options; they are listed in order of increasing strength to get the job done.  They are all available over the counter, and you may need to use some, POSSIBLY even all of these options:    Drink plenty of fluids (prune juice may be helpful) and high fiber foods Colace 100 mg by mouth twice a day  Senokot for constipation as directed and as needed Dulcolax (bisacodyl), take with full glass of water  Miralax (polyethylene glycol) once or twice a day as needed.  If you have tried all these things and are unable to have a bowel movement in the first 3-4 days after surgery call either your surgeon or your primary doctor.    If you experience loose stools or diarrhea, hold the medications until you stool forms back up.  If your symptoms do not get better within 1 week or if they get worse, check with your doctor.  If you experience "the worst abdominal pain ever" or develop nausea or vomiting, please contact the office immediately for further recommendations for treatment.   ITCHING:  If you experience itching with your medications, try taking only a single pain pill, or even half a pain pill at a time.  You can also use Benadryl over the counter for itching or also to help with sleep.   TED HOSE STOCKINGS:  Use stockings on both legs until for at least 2 weeks or as directed by physician office. They may be removed at night for sleeping.  MEDICATIONS:  See your medication summary on the "After Visit Summary" that nursing will review with you.  You may have some home medications which will be placed on hold until you complete the course of blood thinner medication.  It is important for you to complete the blood thinner medication as prescribed.  PRECAUTIONS:  If you experience chest pain or shortness of breath - call 911 immediately for transfer to the hospital emergency department.   If you develop a fever greater that  101 F, purulent drainage from wound, increased redness or drainage from wound, foul odor from the wound/dressing, or calf pain - CONTACT YOUR SURGEON.                                                   FOLLOW-UP APPOINTMENTS:  If you do not already have a post-op appointment, please call the office for an appointment to be seen by your surgeon.  Guidelines for how soon to be seen are listed in your "After Visit Summary", but are typically between 1-4 weeks after surgery.  OTHER INSTRUCTIONS:   Knee Replacement:  Do not place pillow under knee, focus on keeping the knee straight while resting. CPM instructions: 0-90 degrees, 2 hours in the morning, 2 hours in the afternoon, and 2 hours in the evening. Place foam block, curve side up under heel at all times except when in CPM or when walking.  DO NOT modify, tear, cut, or change the foam block in any way.  MAKE SURE YOU:  Understand these instructions.  Get help right away if you are not doing well or get worse.    Thank you for letting us be a part of your medical care  team.  It is a privilege we respect greatly.  We hope these instructions will help you stay on track for a fast and full recovery!   Increase activity slowly as tolerated   Complete by: As directed       DISCHARGE MEDICATIONS:   Allergies as of 05/10/2019      Reactions   Other Other (See Comments)   Arthritis medications- causes a bacteria in the abdomen      Medication List    TAKE these medications   ALPRAZolam 0.5 MG tablet Commonly known as: XANAX TAKE 1 TABLET BY MOUTH EVERY NIGHT AT BEDTIME   aspirin 325 MG EC tablet Take 1 tablet (325 mg total) by mouth 2 (two) times daily. What changed:   medication strength  how much to take  when to take this   CALCIUM 1200+D3 PO Take 1 tablet by mouth daily.   denosumab 60 MG/ML Sosy injection Commonly known as: PROLIA Inject 60 mg into the skin every 6 (six) months.   famotidine 40 MG tablet Commonly known  as: PEPCID Take 1 tablet (40 mg total) by mouth at bedtime.   Fish Oil 1200 MG Caps Take 1,200 mg by mouth 2 (two) times daily.   fluticasone 50 MCG/ACT nasal spray Commonly known as: FLONASE Use 2 sprays in each  nostril once daily What changed: when to take this   Glucosamine Chondroitin Triple Tabs Take 1 tablet by mouth 2 (two) times daily.   levocetirizine 5 MG tablet Commonly known as: XYZAL Take 5 mg by mouth daily.   losartan 100 MG tablet Commonly known as: COZAAR TAKE ONE TABLET (100MG  TOTAL) BY MOUTH DAILY What changed: See the new instructions.   LUBRICATING EYE DROPS OP Place 1 drop into both eyes 2 (two) times daily.   MELATONIN ER PO Take by mouth.   methocarbamol 500 MG tablet Commonly known as: ROBAXIN Take 1-2 tablets (500-1,000 mg total) by mouth every 6 (six) hours as needed for muscle spasms.   multivitamin tablet Take 1 tablet by mouth daily.   Myrbetriq 50 MG Tb24 tablet Generic drug: mirabegron ER Take 50 mg by mouth daily.   oxyCODONE 5 MG immediate release tablet Commonly known as: Oxy IR/ROXICODONE Take 1-2 tablets (5-10 mg total) by mouth every 6 (six) hours as needed for moderate pain (pain score 4-6).   pantoprazole 40 MG tablet Commonly known as: PROTONIX TAKE ONE TABLET BY MOUTH TWICE DAILY BEFORE MEALS What changed:   how much to take  how to take this  when to take this  additional instructions   pravastatin 40 MG tablet Commonly known as: PRAVACHOL TAKE ONE (1) TABLET BY MOUTH EVERY DAY What changed: See the new instructions.   rOPINIRole 2 MG tablet Commonly known as: REQUIP TAKE ONE TABLET BY MOUTH EVERY NIGHT AT BEDTIME   traMADol 50 MG tablet Commonly known as: ULTRAM TAKE ONE (1) TABLET BY MOUTH 3 TIMES DAILY AS NEEDED What changed:   how much to take  how to take this  when to take this  reasons to take this  additional instructions   TURMERIC COMPLEX/BLACK PEPPER PO Take 1 tablet by mouth  daily.       FOLLOW UP VISIT:   Follow-up Information    Vickey Huger, MD Follow up on 05/19/2019.   Specialty: Orthopedic Surgery Contact information: [address] [place] [address] [phone]           DISPOSITION: HOME VS. SNF  CONDITION:  Good   Donia Ast 05/16/2019, 9:01 AM

## 2019-05-17 DIAGNOSIS — E7849 Other hyperlipidemia: Secondary | ICD-10-CM | POA: Diagnosis not present

## 2019-05-17 DIAGNOSIS — Z471 Aftercare following joint replacement surgery: Secondary | ICD-10-CM | POA: Diagnosis not present

## 2019-05-17 DIAGNOSIS — F329 Major depressive disorder, single episode, unspecified: Secondary | ICD-10-CM | POA: Diagnosis not present

## 2019-05-17 DIAGNOSIS — I10 Essential (primary) hypertension: Secondary | ICD-10-CM | POA: Diagnosis not present

## 2019-05-17 DIAGNOSIS — M810 Age-related osteoporosis without current pathological fracture: Secondary | ICD-10-CM | POA: Diagnosis not present

## 2019-05-17 DIAGNOSIS — K219 Gastro-esophageal reflux disease without esophagitis: Secondary | ICD-10-CM | POA: Diagnosis not present

## 2019-05-19 DIAGNOSIS — Z96651 Presence of right artificial knee joint: Secondary | ICD-10-CM | POA: Diagnosis not present

## 2019-05-19 DIAGNOSIS — Z471 Aftercare following joint replacement surgery: Secondary | ICD-10-CM | POA: Diagnosis not present

## 2019-05-20 DIAGNOSIS — M25661 Stiffness of right knee, not elsewhere classified: Secondary | ICD-10-CM | POA: Diagnosis not present

## 2019-05-20 DIAGNOSIS — M25561 Pain in right knee: Secondary | ICD-10-CM | POA: Diagnosis not present

## 2019-05-20 DIAGNOSIS — R262 Difficulty in walking, not elsewhere classified: Secondary | ICD-10-CM | POA: Diagnosis not present

## 2019-05-20 DIAGNOSIS — M25461 Effusion, right knee: Secondary | ICD-10-CM | POA: Diagnosis not present

## 2019-05-20 DIAGNOSIS — M6281 Muscle weakness (generalized): Secondary | ICD-10-CM | POA: Diagnosis not present

## 2019-05-23 ENCOUNTER — Other Ambulatory Visit: Payer: Self-pay | Admitting: Family Medicine

## 2019-05-23 DIAGNOSIS — M6281 Muscle weakness (generalized): Secondary | ICD-10-CM | POA: Diagnosis not present

## 2019-05-23 DIAGNOSIS — M25561 Pain in right knee: Secondary | ICD-10-CM | POA: Diagnosis not present

## 2019-05-23 DIAGNOSIS — R262 Difficulty in walking, not elsewhere classified: Secondary | ICD-10-CM | POA: Diagnosis not present

## 2019-05-23 DIAGNOSIS — M25461 Effusion, right knee: Secondary | ICD-10-CM | POA: Diagnosis not present

## 2019-05-23 DIAGNOSIS — M25661 Stiffness of right knee, not elsewhere classified: Secondary | ICD-10-CM | POA: Diagnosis not present

## 2019-05-25 DIAGNOSIS — M25561 Pain in right knee: Secondary | ICD-10-CM | POA: Diagnosis not present

## 2019-05-25 DIAGNOSIS — R262 Difficulty in walking, not elsewhere classified: Secondary | ICD-10-CM | POA: Diagnosis not present

## 2019-05-25 DIAGNOSIS — M25661 Stiffness of right knee, not elsewhere classified: Secondary | ICD-10-CM | POA: Diagnosis not present

## 2019-05-25 DIAGNOSIS — M25461 Effusion, right knee: Secondary | ICD-10-CM | POA: Diagnosis not present

## 2019-05-25 DIAGNOSIS — M6281 Muscle weakness (generalized): Secondary | ICD-10-CM | POA: Diagnosis not present

## 2019-05-26 DIAGNOSIS — R350 Frequency of micturition: Secondary | ICD-10-CM | POA: Diagnosis not present

## 2019-05-27 DIAGNOSIS — M25561 Pain in right knee: Secondary | ICD-10-CM | POA: Diagnosis not present

## 2019-05-27 DIAGNOSIS — M6281 Muscle weakness (generalized): Secondary | ICD-10-CM | POA: Diagnosis not present

## 2019-05-27 DIAGNOSIS — M25661 Stiffness of right knee, not elsewhere classified: Secondary | ICD-10-CM | POA: Diagnosis not present

## 2019-05-27 DIAGNOSIS — M25461 Effusion, right knee: Secondary | ICD-10-CM | POA: Diagnosis not present

## 2019-05-27 DIAGNOSIS — R262 Difficulty in walking, not elsewhere classified: Secondary | ICD-10-CM | POA: Diagnosis not present

## 2019-05-31 ENCOUNTER — Other Ambulatory Visit: Payer: Self-pay | Admitting: Family Medicine

## 2019-05-31 DIAGNOSIS — M6281 Muscle weakness (generalized): Secondary | ICD-10-CM | POA: Diagnosis not present

## 2019-05-31 DIAGNOSIS — M25561 Pain in right knee: Secondary | ICD-10-CM | POA: Diagnosis not present

## 2019-05-31 DIAGNOSIS — M25661 Stiffness of right knee, not elsewhere classified: Secondary | ICD-10-CM | POA: Diagnosis not present

## 2019-05-31 DIAGNOSIS — R262 Difficulty in walking, not elsewhere classified: Secondary | ICD-10-CM | POA: Diagnosis not present

## 2019-05-31 DIAGNOSIS — M25461 Effusion, right knee: Secondary | ICD-10-CM | POA: Diagnosis not present

## 2019-05-31 NOTE — Telephone Encounter (Signed)
Last filled: 03/31/2019 Last office visit: 02/01/2019

## 2019-06-01 DIAGNOSIS — M25461 Effusion, right knee: Secondary | ICD-10-CM | POA: Diagnosis not present

## 2019-06-01 DIAGNOSIS — M25561 Pain in right knee: Secondary | ICD-10-CM | POA: Diagnosis not present

## 2019-06-01 DIAGNOSIS — R262 Difficulty in walking, not elsewhere classified: Secondary | ICD-10-CM | POA: Diagnosis not present

## 2019-06-01 DIAGNOSIS — M6281 Muscle weakness (generalized): Secondary | ICD-10-CM | POA: Diagnosis not present

## 2019-06-01 DIAGNOSIS — M25661 Stiffness of right knee, not elsewhere classified: Secondary | ICD-10-CM | POA: Diagnosis not present

## 2019-06-03 DIAGNOSIS — M25661 Stiffness of right knee, not elsewhere classified: Secondary | ICD-10-CM | POA: Diagnosis not present

## 2019-06-03 DIAGNOSIS — M25461 Effusion, right knee: Secondary | ICD-10-CM | POA: Diagnosis not present

## 2019-06-03 DIAGNOSIS — M25561 Pain in right knee: Secondary | ICD-10-CM | POA: Diagnosis not present

## 2019-06-03 DIAGNOSIS — R262 Difficulty in walking, not elsewhere classified: Secondary | ICD-10-CM | POA: Diagnosis not present

## 2019-06-03 DIAGNOSIS — M6281 Muscle weakness (generalized): Secondary | ICD-10-CM | POA: Diagnosis not present

## 2019-06-06 DIAGNOSIS — M25561 Pain in right knee: Secondary | ICD-10-CM | POA: Diagnosis not present

## 2019-06-06 DIAGNOSIS — M25661 Stiffness of right knee, not elsewhere classified: Secondary | ICD-10-CM | POA: Diagnosis not present

## 2019-06-06 DIAGNOSIS — M25461 Effusion, right knee: Secondary | ICD-10-CM | POA: Diagnosis not present

## 2019-06-06 DIAGNOSIS — M6281 Muscle weakness (generalized): Secondary | ICD-10-CM | POA: Diagnosis not present

## 2019-06-06 DIAGNOSIS — R262 Difficulty in walking, not elsewhere classified: Secondary | ICD-10-CM | POA: Diagnosis not present

## 2019-06-08 DIAGNOSIS — M25661 Stiffness of right knee, not elsewhere classified: Secondary | ICD-10-CM | POA: Diagnosis not present

## 2019-06-08 DIAGNOSIS — M6281 Muscle weakness (generalized): Secondary | ICD-10-CM | POA: Diagnosis not present

## 2019-06-08 DIAGNOSIS — M25461 Effusion, right knee: Secondary | ICD-10-CM | POA: Diagnosis not present

## 2019-06-08 DIAGNOSIS — R262 Difficulty in walking, not elsewhere classified: Secondary | ICD-10-CM | POA: Diagnosis not present

## 2019-06-08 DIAGNOSIS — M25561 Pain in right knee: Secondary | ICD-10-CM | POA: Diagnosis not present

## 2019-06-10 DIAGNOSIS — R262 Difficulty in walking, not elsewhere classified: Secondary | ICD-10-CM | POA: Diagnosis not present

## 2019-06-10 DIAGNOSIS — M25661 Stiffness of right knee, not elsewhere classified: Secondary | ICD-10-CM | POA: Diagnosis not present

## 2019-06-10 DIAGNOSIS — M25461 Effusion, right knee: Secondary | ICD-10-CM | POA: Diagnosis not present

## 2019-06-10 DIAGNOSIS — M6281 Muscle weakness (generalized): Secondary | ICD-10-CM | POA: Diagnosis not present

## 2019-06-10 DIAGNOSIS — M25561 Pain in right knee: Secondary | ICD-10-CM | POA: Diagnosis not present

## 2019-06-14 DIAGNOSIS — M25661 Stiffness of right knee, not elsewhere classified: Secondary | ICD-10-CM | POA: Diagnosis not present

## 2019-06-14 DIAGNOSIS — M25461 Effusion, right knee: Secondary | ICD-10-CM | POA: Diagnosis not present

## 2019-06-14 DIAGNOSIS — R262 Difficulty in walking, not elsewhere classified: Secondary | ICD-10-CM | POA: Diagnosis not present

## 2019-06-14 DIAGNOSIS — M25561 Pain in right knee: Secondary | ICD-10-CM | POA: Diagnosis not present

## 2019-06-14 DIAGNOSIS — M6281 Muscle weakness (generalized): Secondary | ICD-10-CM | POA: Diagnosis not present

## 2019-06-15 DIAGNOSIS — M25561 Pain in right knee: Secondary | ICD-10-CM | POA: Diagnosis not present

## 2019-06-15 DIAGNOSIS — M6281 Muscle weakness (generalized): Secondary | ICD-10-CM | POA: Diagnosis not present

## 2019-06-15 DIAGNOSIS — M25461 Effusion, right knee: Secondary | ICD-10-CM | POA: Diagnosis not present

## 2019-06-15 DIAGNOSIS — M25661 Stiffness of right knee, not elsewhere classified: Secondary | ICD-10-CM | POA: Diagnosis not present

## 2019-06-15 DIAGNOSIS — R262 Difficulty in walking, not elsewhere classified: Secondary | ICD-10-CM | POA: Diagnosis not present

## 2019-06-17 DIAGNOSIS — M6281 Muscle weakness (generalized): Secondary | ICD-10-CM | POA: Diagnosis not present

## 2019-06-17 DIAGNOSIS — M25661 Stiffness of right knee, not elsewhere classified: Secondary | ICD-10-CM | POA: Diagnosis not present

## 2019-06-17 DIAGNOSIS — M25561 Pain in right knee: Secondary | ICD-10-CM | POA: Diagnosis not present

## 2019-06-17 DIAGNOSIS — M25461 Effusion, right knee: Secondary | ICD-10-CM | POA: Diagnosis not present

## 2019-06-17 DIAGNOSIS — R262 Difficulty in walking, not elsewhere classified: Secondary | ICD-10-CM | POA: Diagnosis not present

## 2019-06-20 DIAGNOSIS — M25661 Stiffness of right knee, not elsewhere classified: Secondary | ICD-10-CM | POA: Diagnosis not present

## 2019-06-20 DIAGNOSIS — M6281 Muscle weakness (generalized): Secondary | ICD-10-CM | POA: Diagnosis not present

## 2019-06-20 DIAGNOSIS — R262 Difficulty in walking, not elsewhere classified: Secondary | ICD-10-CM | POA: Diagnosis not present

## 2019-06-20 DIAGNOSIS — M25561 Pain in right knee: Secondary | ICD-10-CM | POA: Diagnosis not present

## 2019-06-20 DIAGNOSIS — M25461 Effusion, right knee: Secondary | ICD-10-CM | POA: Diagnosis not present

## 2019-06-22 ENCOUNTER — Encounter: Payer: Self-pay | Admitting: Family Medicine

## 2019-06-22 DIAGNOSIS — M25461 Effusion, right knee: Secondary | ICD-10-CM | POA: Diagnosis not present

## 2019-06-22 DIAGNOSIS — M25561 Pain in right knee: Secondary | ICD-10-CM | POA: Diagnosis not present

## 2019-06-22 DIAGNOSIS — M25661 Stiffness of right knee, not elsewhere classified: Secondary | ICD-10-CM | POA: Diagnosis not present

## 2019-06-22 DIAGNOSIS — R262 Difficulty in walking, not elsewhere classified: Secondary | ICD-10-CM | POA: Diagnosis not present

## 2019-06-22 DIAGNOSIS — M6281 Muscle weakness (generalized): Secondary | ICD-10-CM | POA: Diagnosis not present

## 2019-06-23 DIAGNOSIS — N3941 Urge incontinence: Secondary | ICD-10-CM | POA: Diagnosis not present

## 2019-06-24 DIAGNOSIS — M25461 Effusion, right knee: Secondary | ICD-10-CM | POA: Diagnosis not present

## 2019-06-24 DIAGNOSIS — R262 Difficulty in walking, not elsewhere classified: Secondary | ICD-10-CM | POA: Diagnosis not present

## 2019-06-24 DIAGNOSIS — M25561 Pain in right knee: Secondary | ICD-10-CM | POA: Diagnosis not present

## 2019-06-24 DIAGNOSIS — M6281 Muscle weakness (generalized): Secondary | ICD-10-CM | POA: Diagnosis not present

## 2019-06-24 DIAGNOSIS — M25661 Stiffness of right knee, not elsewhere classified: Secondary | ICD-10-CM | POA: Diagnosis not present

## 2019-06-27 ENCOUNTER — Other Ambulatory Visit: Payer: Self-pay | Admitting: Family Medicine

## 2019-06-27 DIAGNOSIS — M25461 Effusion, right knee: Secondary | ICD-10-CM | POA: Diagnosis not present

## 2019-06-27 DIAGNOSIS — M6281 Muscle weakness (generalized): Secondary | ICD-10-CM | POA: Diagnosis not present

## 2019-06-27 DIAGNOSIS — M25561 Pain in right knee: Secondary | ICD-10-CM | POA: Diagnosis not present

## 2019-06-27 DIAGNOSIS — M25661 Stiffness of right knee, not elsewhere classified: Secondary | ICD-10-CM | POA: Diagnosis not present

## 2019-06-27 DIAGNOSIS — R262 Difficulty in walking, not elsewhere classified: Secondary | ICD-10-CM | POA: Diagnosis not present

## 2019-06-28 NOTE — Telephone Encounter (Signed)
Requesting refill    Tramadol  LOV:  12/10/18  LRF:  12/27/18

## 2019-06-29 DIAGNOSIS — M6281 Muscle weakness (generalized): Secondary | ICD-10-CM | POA: Diagnosis not present

## 2019-06-29 DIAGNOSIS — M25661 Stiffness of right knee, not elsewhere classified: Secondary | ICD-10-CM | POA: Diagnosis not present

## 2019-06-29 DIAGNOSIS — M25561 Pain in right knee: Secondary | ICD-10-CM | POA: Diagnosis not present

## 2019-06-29 DIAGNOSIS — R262 Difficulty in walking, not elsewhere classified: Secondary | ICD-10-CM | POA: Diagnosis not present

## 2019-06-29 DIAGNOSIS — M25461 Effusion, right knee: Secondary | ICD-10-CM | POA: Diagnosis not present

## 2019-06-30 ENCOUNTER — Ambulatory Visit (INDEPENDENT_AMBULATORY_CARE_PROVIDER_SITE_OTHER): Payer: Medicare Other | Admitting: Otolaryngology

## 2019-06-30 ENCOUNTER — Other Ambulatory Visit: Payer: Self-pay

## 2019-06-30 DIAGNOSIS — H903 Sensorineural hearing loss, bilateral: Secondary | ICD-10-CM

## 2019-06-30 DIAGNOSIS — H838X3 Other specified diseases of inner ear, bilateral: Secondary | ICD-10-CM | POA: Diagnosis not present

## 2019-06-30 DIAGNOSIS — H7201 Central perforation of tympanic membrane, right ear: Secondary | ICD-10-CM

## 2019-07-01 ENCOUNTER — Other Ambulatory Visit: Payer: Self-pay | Admitting: Family Medicine

## 2019-07-01 NOTE — Telephone Encounter (Signed)
Pt is requesting refill on Xanax   LOV: 11/22/18  LRF:   05/31/19

## 2019-07-04 DIAGNOSIS — M6281 Muscle weakness (generalized): Secondary | ICD-10-CM | POA: Diagnosis not present

## 2019-07-04 DIAGNOSIS — M25661 Stiffness of right knee, not elsewhere classified: Secondary | ICD-10-CM | POA: Diagnosis not present

## 2019-07-04 DIAGNOSIS — R262 Difficulty in walking, not elsewhere classified: Secondary | ICD-10-CM | POA: Diagnosis not present

## 2019-07-04 DIAGNOSIS — M25461 Effusion, right knee: Secondary | ICD-10-CM | POA: Diagnosis not present

## 2019-07-04 DIAGNOSIS — M25561 Pain in right knee: Secondary | ICD-10-CM | POA: Diagnosis not present

## 2019-07-06 DIAGNOSIS — M25661 Stiffness of right knee, not elsewhere classified: Secondary | ICD-10-CM | POA: Diagnosis not present

## 2019-07-06 DIAGNOSIS — M25461 Effusion, right knee: Secondary | ICD-10-CM | POA: Diagnosis not present

## 2019-07-06 DIAGNOSIS — Z23 Encounter for immunization: Secondary | ICD-10-CM | POA: Diagnosis not present

## 2019-07-06 DIAGNOSIS — M25561 Pain in right knee: Secondary | ICD-10-CM | POA: Diagnosis not present

## 2019-07-06 DIAGNOSIS — R262 Difficulty in walking, not elsewhere classified: Secondary | ICD-10-CM | POA: Diagnosis not present

## 2019-07-06 DIAGNOSIS — M6281 Muscle weakness (generalized): Secondary | ICD-10-CM | POA: Diagnosis not present

## 2019-07-12 DIAGNOSIS — M25461 Effusion, right knee: Secondary | ICD-10-CM | POA: Diagnosis not present

## 2019-07-12 DIAGNOSIS — R262 Difficulty in walking, not elsewhere classified: Secondary | ICD-10-CM | POA: Diagnosis not present

## 2019-07-12 DIAGNOSIS — M25661 Stiffness of right knee, not elsewhere classified: Secondary | ICD-10-CM | POA: Diagnosis not present

## 2019-07-12 DIAGNOSIS — M25561 Pain in right knee: Secondary | ICD-10-CM | POA: Diagnosis not present

## 2019-07-12 DIAGNOSIS — M6281 Muscle weakness (generalized): Secondary | ICD-10-CM | POA: Diagnosis not present

## 2019-07-14 ENCOUNTER — Other Ambulatory Visit: Payer: Self-pay | Admitting: Family Medicine

## 2019-07-14 DIAGNOSIS — M6281 Muscle weakness (generalized): Secondary | ICD-10-CM | POA: Diagnosis not present

## 2019-07-14 DIAGNOSIS — R262 Difficulty in walking, not elsewhere classified: Secondary | ICD-10-CM | POA: Diagnosis not present

## 2019-07-14 DIAGNOSIS — M25661 Stiffness of right knee, not elsewhere classified: Secondary | ICD-10-CM | POA: Diagnosis not present

## 2019-07-14 DIAGNOSIS — M25561 Pain in right knee: Secondary | ICD-10-CM | POA: Diagnosis not present

## 2019-07-14 DIAGNOSIS — M25461 Effusion, right knee: Secondary | ICD-10-CM | POA: Diagnosis not present

## 2019-07-19 DIAGNOSIS — M25661 Stiffness of right knee, not elsewhere classified: Secondary | ICD-10-CM | POA: Diagnosis not present

## 2019-07-19 DIAGNOSIS — M6281 Muscle weakness (generalized): Secondary | ICD-10-CM | POA: Diagnosis not present

## 2019-07-19 DIAGNOSIS — M25461 Effusion, right knee: Secondary | ICD-10-CM | POA: Diagnosis not present

## 2019-07-19 DIAGNOSIS — M25561 Pain in right knee: Secondary | ICD-10-CM | POA: Diagnosis not present

## 2019-07-19 DIAGNOSIS — R262 Difficulty in walking, not elsewhere classified: Secondary | ICD-10-CM | POA: Diagnosis not present

## 2019-07-21 DIAGNOSIS — Z96651 Presence of right artificial knee joint: Secondary | ICD-10-CM | POA: Diagnosis not present

## 2019-07-21 DIAGNOSIS — Z471 Aftercare following joint replacement surgery: Secondary | ICD-10-CM | POA: Diagnosis not present

## 2019-07-21 DIAGNOSIS — M25661 Stiffness of right knee, not elsewhere classified: Secondary | ICD-10-CM | POA: Diagnosis not present

## 2019-07-21 DIAGNOSIS — N3941 Urge incontinence: Secondary | ICD-10-CM | POA: Diagnosis not present

## 2019-07-21 DIAGNOSIS — R350 Frequency of micturition: Secondary | ICD-10-CM | POA: Diagnosis not present

## 2019-07-21 DIAGNOSIS — R262 Difficulty in walking, not elsewhere classified: Secondary | ICD-10-CM | POA: Diagnosis not present

## 2019-07-21 DIAGNOSIS — R29898 Other symptoms and signs involving the musculoskeletal system: Secondary | ICD-10-CM | POA: Diagnosis not present

## 2019-07-21 DIAGNOSIS — M25561 Pain in right knee: Secondary | ICD-10-CM | POA: Diagnosis not present

## 2019-07-21 DIAGNOSIS — M25461 Effusion, right knee: Secondary | ICD-10-CM | POA: Diagnosis not present

## 2019-07-26 DIAGNOSIS — Z96651 Presence of right artificial knee joint: Secondary | ICD-10-CM | POA: Diagnosis not present

## 2019-07-26 DIAGNOSIS — M25661 Stiffness of right knee, not elsewhere classified: Secondary | ICD-10-CM | POA: Diagnosis not present

## 2019-07-26 DIAGNOSIS — M25561 Pain in right knee: Secondary | ICD-10-CM | POA: Diagnosis not present

## 2019-07-26 DIAGNOSIS — R262 Difficulty in walking, not elsewhere classified: Secondary | ICD-10-CM | POA: Diagnosis not present

## 2019-07-26 DIAGNOSIS — M25461 Effusion, right knee: Secondary | ICD-10-CM | POA: Diagnosis not present

## 2019-07-26 DIAGNOSIS — R29898 Other symptoms and signs involving the musculoskeletal system: Secondary | ICD-10-CM | POA: Diagnosis not present

## 2019-07-29 DIAGNOSIS — M25561 Pain in right knee: Secondary | ICD-10-CM | POA: Diagnosis not present

## 2019-07-29 DIAGNOSIS — M25461 Effusion, right knee: Secondary | ICD-10-CM | POA: Diagnosis not present

## 2019-07-29 DIAGNOSIS — Z471 Aftercare following joint replacement surgery: Secondary | ICD-10-CM | POA: Diagnosis not present

## 2019-07-29 DIAGNOSIS — M25661 Stiffness of right knee, not elsewhere classified: Secondary | ICD-10-CM | POA: Diagnosis not present

## 2019-07-29 DIAGNOSIS — R262 Difficulty in walking, not elsewhere classified: Secondary | ICD-10-CM | POA: Diagnosis not present

## 2019-07-29 DIAGNOSIS — Z96651 Presence of right artificial knee joint: Secondary | ICD-10-CM | POA: Diagnosis not present

## 2019-07-29 DIAGNOSIS — R29898 Other symptoms and signs involving the musculoskeletal system: Secondary | ICD-10-CM | POA: Diagnosis not present

## 2019-08-01 DIAGNOSIS — M25561 Pain in right knee: Secondary | ICD-10-CM | POA: Diagnosis not present

## 2019-08-01 DIAGNOSIS — Z96651 Presence of right artificial knee joint: Secondary | ICD-10-CM | POA: Diagnosis not present

## 2019-08-01 DIAGNOSIS — Z471 Aftercare following joint replacement surgery: Secondary | ICD-10-CM | POA: Diagnosis not present

## 2019-08-01 DIAGNOSIS — R262 Difficulty in walking, not elsewhere classified: Secondary | ICD-10-CM | POA: Diagnosis not present

## 2019-08-01 DIAGNOSIS — M25661 Stiffness of right knee, not elsewhere classified: Secondary | ICD-10-CM | POA: Diagnosis not present

## 2019-08-01 DIAGNOSIS — R29898 Other symptoms and signs involving the musculoskeletal system: Secondary | ICD-10-CM | POA: Diagnosis not present

## 2019-08-01 DIAGNOSIS — M25461 Effusion, right knee: Secondary | ICD-10-CM | POA: Diagnosis not present

## 2019-08-03 ENCOUNTER — Other Ambulatory Visit: Payer: Self-pay | Admitting: Family Medicine

## 2019-08-03 NOTE — Telephone Encounter (Signed)
Last office visit: 12/10/2018 Last refilled: 07/04/2019

## 2019-08-05 DIAGNOSIS — R29898 Other symptoms and signs involving the musculoskeletal system: Secondary | ICD-10-CM | POA: Diagnosis not present

## 2019-08-05 DIAGNOSIS — Z96651 Presence of right artificial knee joint: Secondary | ICD-10-CM | POA: Diagnosis not present

## 2019-08-05 DIAGNOSIS — M25461 Effusion, right knee: Secondary | ICD-10-CM | POA: Diagnosis not present

## 2019-08-05 DIAGNOSIS — M25561 Pain in right knee: Secondary | ICD-10-CM | POA: Diagnosis not present

## 2019-08-05 DIAGNOSIS — M25661 Stiffness of right knee, not elsewhere classified: Secondary | ICD-10-CM | POA: Diagnosis not present

## 2019-08-05 DIAGNOSIS — R262 Difficulty in walking, not elsewhere classified: Secondary | ICD-10-CM | POA: Diagnosis not present

## 2019-08-09 DIAGNOSIS — Z96651 Presence of right artificial knee joint: Secondary | ICD-10-CM | POA: Diagnosis not present

## 2019-08-09 DIAGNOSIS — R29898 Other symptoms and signs involving the musculoskeletal system: Secondary | ICD-10-CM | POA: Diagnosis not present

## 2019-08-09 DIAGNOSIS — M25461 Effusion, right knee: Secondary | ICD-10-CM | POA: Diagnosis not present

## 2019-08-09 DIAGNOSIS — M25661 Stiffness of right knee, not elsewhere classified: Secondary | ICD-10-CM | POA: Diagnosis not present

## 2019-08-09 DIAGNOSIS — R262 Difficulty in walking, not elsewhere classified: Secondary | ICD-10-CM | POA: Diagnosis not present

## 2019-08-09 DIAGNOSIS — M25561 Pain in right knee: Secondary | ICD-10-CM | POA: Diagnosis not present

## 2019-08-12 DIAGNOSIS — Z96651 Presence of right artificial knee joint: Secondary | ICD-10-CM | POA: Diagnosis not present

## 2019-08-12 DIAGNOSIS — M25661 Stiffness of right knee, not elsewhere classified: Secondary | ICD-10-CM | POA: Diagnosis not present

## 2019-08-12 DIAGNOSIS — M25461 Effusion, right knee: Secondary | ICD-10-CM | POA: Diagnosis not present

## 2019-08-12 DIAGNOSIS — M25561 Pain in right knee: Secondary | ICD-10-CM | POA: Diagnosis not present

## 2019-08-12 DIAGNOSIS — R29898 Other symptoms and signs involving the musculoskeletal system: Secondary | ICD-10-CM | POA: Diagnosis not present

## 2019-08-12 DIAGNOSIS — R262 Difficulty in walking, not elsewhere classified: Secondary | ICD-10-CM | POA: Diagnosis not present

## 2019-08-16 DIAGNOSIS — Z96651 Presence of right artificial knee joint: Secondary | ICD-10-CM | POA: Diagnosis not present

## 2019-08-16 DIAGNOSIS — R29898 Other symptoms and signs involving the musculoskeletal system: Secondary | ICD-10-CM | POA: Diagnosis not present

## 2019-08-16 DIAGNOSIS — M25661 Stiffness of right knee, not elsewhere classified: Secondary | ICD-10-CM | POA: Diagnosis not present

## 2019-08-16 DIAGNOSIS — R262 Difficulty in walking, not elsewhere classified: Secondary | ICD-10-CM | POA: Diagnosis not present

## 2019-08-16 DIAGNOSIS — M25561 Pain in right knee: Secondary | ICD-10-CM | POA: Diagnosis not present

## 2019-08-16 DIAGNOSIS — M25461 Effusion, right knee: Secondary | ICD-10-CM | POA: Diagnosis not present

## 2019-08-16 DIAGNOSIS — N3941 Urge incontinence: Secondary | ICD-10-CM | POA: Diagnosis not present

## 2019-08-23 DIAGNOSIS — R29898 Other symptoms and signs involving the musculoskeletal system: Secondary | ICD-10-CM | POA: Diagnosis not present

## 2019-08-23 DIAGNOSIS — R262 Difficulty in walking, not elsewhere classified: Secondary | ICD-10-CM | POA: Diagnosis not present

## 2019-08-23 DIAGNOSIS — Z96651 Presence of right artificial knee joint: Secondary | ICD-10-CM | POA: Diagnosis not present

## 2019-08-23 DIAGNOSIS — M25561 Pain in right knee: Secondary | ICD-10-CM | POA: Diagnosis not present

## 2019-08-23 DIAGNOSIS — M25661 Stiffness of right knee, not elsewhere classified: Secondary | ICD-10-CM | POA: Diagnosis not present

## 2019-08-23 DIAGNOSIS — M25461 Effusion, right knee: Secondary | ICD-10-CM | POA: Diagnosis not present

## 2019-08-25 ENCOUNTER — Other Ambulatory Visit: Payer: Self-pay | Admitting: Family Medicine

## 2019-08-25 DIAGNOSIS — I10 Essential (primary) hypertension: Secondary | ICD-10-CM

## 2019-08-26 ENCOUNTER — Other Ambulatory Visit: Payer: Medicare Other

## 2019-08-26 ENCOUNTER — Other Ambulatory Visit: Payer: Self-pay

## 2019-08-26 ENCOUNTER — Ambulatory Visit (INDEPENDENT_AMBULATORY_CARE_PROVIDER_SITE_OTHER): Payer: Medicare Other

## 2019-08-26 DIAGNOSIS — E7800 Pure hypercholesterolemia, unspecified: Secondary | ICD-10-CM

## 2019-08-26 DIAGNOSIS — M8580 Other specified disorders of bone density and structure, unspecified site: Secondary | ICD-10-CM

## 2019-08-27 LAB — LIPID PANEL
Cholesterol: 166 mg/dL (ref <200)
HDL: 53 mg/dL (ref >=50)
LDL Cholesterol (Calc): 94 mg/dL (calc)
Non-HDL Cholesterol (Calc): 113 mg/dL (calc) (ref <130)
Total CHOL/HDL Ratio: 3.1 (calc) (ref <5.0)
Triglycerides: 95 mg/dL (ref <150)

## 2019-09-01 DIAGNOSIS — R29898 Other symptoms and signs involving the musculoskeletal system: Secondary | ICD-10-CM | POA: Diagnosis not present

## 2019-09-01 DIAGNOSIS — M25661 Stiffness of right knee, not elsewhere classified: Secondary | ICD-10-CM | POA: Diagnosis not present

## 2019-09-01 DIAGNOSIS — M25561 Pain in right knee: Secondary | ICD-10-CM | POA: Diagnosis not present

## 2019-09-01 DIAGNOSIS — Z96651 Presence of right artificial knee joint: Secondary | ICD-10-CM | POA: Diagnosis not present

## 2019-09-01 DIAGNOSIS — M25461 Effusion, right knee: Secondary | ICD-10-CM | POA: Diagnosis not present

## 2019-09-01 DIAGNOSIS — R262 Difficulty in walking, not elsewhere classified: Secondary | ICD-10-CM | POA: Diagnosis not present

## 2019-09-06 ENCOUNTER — Other Ambulatory Visit: Payer: Self-pay

## 2019-09-06 ENCOUNTER — Ambulatory Visit (INDEPENDENT_AMBULATORY_CARE_PROVIDER_SITE_OTHER): Payer: Medicare Other | Admitting: Family Medicine

## 2019-09-06 ENCOUNTER — Encounter: Payer: Self-pay | Admitting: Family Medicine

## 2019-09-06 VITALS — BP 112/80 | HR 66 | Temp 97.3°F | Resp 14 | Ht 65.0 in | Wt 165.0 lb

## 2019-09-06 DIAGNOSIS — S39012A Strain of muscle, fascia and tendon of lower back, initial encounter: Secondary | ICD-10-CM | POA: Diagnosis not present

## 2019-09-06 MED ORDER — DICLOFENAC SODIUM 75 MG PO TBEC: 75.0000 mg | DELAYED_RELEASE_TABLET | Freq: Two times a day (BID) | ORAL | 0 refills | Status: DC

## 2019-09-06 MED ORDER — METHOCARBAMOL 500 MG PO TABS: 500.0000 mg | ORAL_TABLET | Freq: Four times a day (QID) | ORAL | 0 refills | Status: DC | PRN

## 2019-09-06 NOTE — Progress Notes (Signed)
Subjective:    Patient ID: Allison Freeman, female    DOB: 06-06-1946, 73 y.o.   MRN: [id number]  HPI  Patient is being seen today for low back pain.  She denies any specific injury.  She has been taking physical therapy for her right knee after having knee surgery.  Over the last 2 weeks she has developed a constant pain in her lower back roughly at the level of L5.  It radiates in a bandlike fashion around her lower back.  She denies any neuropathic pain radiating down either her left leg or her right leg.  She denies any numbness or tingling in her legs.  She denies any sciatica.  She does report muscle stiffness and muscle pain.  Particular at night.  She states whenever she tries to roll over in bed it will cause sharp pain in her lower back.  She denies any fever or chills.  She denies any dysuria.  On exam today she has palpable muscle spasms in her left lower back and in her superior gluteus area. Past Medical History:  Diagnosis Date  . Acid reflux   . Arthritis   . Depression   . Family history of adverse reaction to anesthesia    Mother woke up and could hearduring surgery  . Hypercholesterolemia   . Hypertension   . Insomnia   . Osteoporosis   . Panic attacks    panic attacks  . Reflux   . Restless leg syndrome    Past Surgical History:  Procedure Laterality Date  . ABDOMINAL HYSTERECTOMY  2001  . BLADDER SURGERY    . BREAST BIOPSY  1987  . BREAST CYST EXCISION  1998  . CARPAL TUNNEL RELEASE Right 04/06/2015   Procedure: CARPAL TUNNEL RELEASE;  Surgeon: Carole Civil, MD;  Location: [facility];  Service: Orthopedics;  Laterality: Right;  . COLONOSCOPY  09/19/2004   [id number] hemorrhoids, otherwise normal rectum/Sigmoid diverticula.  Remainder of colon mucosa appeared normal  . COLONOSCOPY WITH ESOPHAGOGASTRODUODENOSCOPY (EGD) N/A 06/24/2013   [id number] polyp-removed. Small hiatal hernia. Abnormal gastric mucosa-query NSAID effect-s/p (chronic inactive gastritis,  negative H. pylori)/TCS: Prominent internal hemorrhoids-likely source of hematochezia. Colonic diverticulosis. Colonic polyp-removed (tubular adenoma)  . COLONOSCOPY WITH PROPOFOL N/A 11/01/2018   Procedure: COLONOSCOPY WITH PROPOFOL;  Surgeon: Daneil Dolin, MD;  Location: [facility];  Service: Endoscopy;  Laterality: N/A;  7:30am  . CYSTOCELE REPAIR  2010  . DILATION AND CURETTAGE OF UTERUS    . KNEE SURGERY Right 2003   arthroscopic left  . RECTAL SURGERY    . RECTOCELE REPAIR  2010  . TONSILLECTOMY    . TOTAL KNEE ARTHROPLASTY Right 05/09/2019   Procedure: TOTAL KNEE ARTHROPLASTY;  Surgeon: Vickey Huger, MD;  Location: [facility];  Service: Orthopedics;  Laterality: Right;   Current Outpatient Medications on File Prior to Visit  Medication Sig Dispense Refill  . ALPRAZolam (XANAX) 0.5 MG tablet TAKE ONE TABLET BY MOUTH EVERY NIGHT AT BEDTIME 30 tablet 2  . aspirin EC 325 MG EC tablet Take 1 tablet (325 mg total) by mouth 2 (two) times daily. 30 tablet 0  . Black Pepper-Turmeric (TURMERIC COMPLEX/BLACK PEPPER PO) Take 1 tablet by mouth daily.    . Calcium-Magnesium-Vitamin D (CALCIUM 1200+D3 PO) Take 1 tablet by mouth daily.    . Carboxymethylcellul-Glycerin (LUBRICATING EYE DROPS OP) Place 1 drop into both eyes 2 (two) times daily.    Marland Kitchen denosumab (PROLIA) 60 MG/ML SOSY injection Inject 60 mg into  the skin every 6 (six) months.    . famotidine (PEPCID) 40 MG tablet Take 1 tablet (40 mg total) by mouth at bedtime. 90 tablet 3  . fluticasone (FLONASE) 50 MCG/ACT nasal spray Use 2 sprays in each  nostril once daily (Patient taking differently: Place 2 sprays into both nostrils at bedtime. ) 48 g 3  . levocetirizine (XYZAL) 5 MG tablet Take 5 mg by mouth daily.    Marland Kitchen losartan (COZAAR) 100 MG tablet TAKE ONE TABLET (100MG  TOTAL) BY MOUTH DAILY 90 tablet 3  . MELATONIN ER PO Take by mouth.    . Misc Natural Products (GLUCOSAMINE CHONDROITIN TRIPLE) TABS Take 1 tablet by mouth 2 (two) times  daily.    . Multiple Vitamin (MULTIVITAMIN) tablet Take 1 tablet by mouth daily.    Marland Kitchen MYRBETRIQ 50 MG TB24 tablet Take 50 mg by mouth daily.     . Omega-3 Fatty Acids (FISH OIL) 1200 MG CAPS Take 1,200 mg by mouth 2 (two) times daily.     Marland Kitchen oxyCODONE (OXY IR/ROXICODONE) 5 MG immediate release tablet Take 1-2 tablets (5-10 mg total) by mouth every 6 (six) hours as needed for moderate pain (pain score 4-6). 50 tablet 0  . pantoprazole (PROTONIX) 40 MG tablet TAKE ONE TABLET TWICE DAILY BEFORE MEALS 180 tablet 0  . pravastatin (PRAVACHOL) 40 MG tablet TAKE ONE (1) TABLET BY MOUTH EVERY DAY 90 tablet 1  . rOPINIRole (REQUIP) 2 MG tablet TAKE ONE TABLET BY MOUTH EVERY NIGHT AT BEDTIME (Patient taking differently: Take 2 mg by mouth at bedtime. ) 90 tablet 3  . traMADol (ULTRAM) 50 MG tablet TAKE ONE TABLET BY MOUTH THREE TIMES DAILY AS NEEDED 270 tablet 0   Current Facility-Administered Medications on File Prior to Visit  Medication Dose Route Frequency Provider Last Rate Last Admin  . denosumab (PROLIA) injection 60 mg  60 mg Subcutaneous Q6 months Susy Frizzle, MD   60 mg at 08/26/19 [id number]   Allergies  Allergen Reactions  . Other Other (See Comments)    Arthritis medications- causes a bacteria in the abdomen   Social History   Socioeconomic History  . Marital status: Married    Spouse name: Not on file  . Number of children: Not on file  . Years of education: Not on file  . Highest education level: Not on file  Occupational History  . Not on file  Tobacco Use  . Smoking status: Never Smoker  . Smokeless tobacco: Never Used  Substance and Sexual Activity  . Alcohol use: No  . Drug use: No  . Sexual activity: Yes    Birth control/protection: Surgical  Other Topics Concern  . Not on file  Social History Narrative  . Not on file   Social Determinants of Health   Financial Resource Strain:   . Difficulty of Paying Living Expenses: Not on file  Food Insecurity:   . Worried  About Charity fundraiser in the Last Year: Not on file  . Ran Out of Food in the Last Year: Not on file  Transportation Needs:   . Lack of Transportation (Medical): Not on file  . Lack of Transportation (Non-Medical): Not on file  Physical Activity:   . Days of Exercise per Week: Not on file  . Minutes of Exercise per Session: Not on file  Stress:   . Feeling of Stress : Not on file  Social Connections:   . Frequency of Communication with Friends and Family: Not  on file  . Frequency of Social Gatherings with Friends and Family: Not on file  . Attends Religious Services: Not on file  . Active Member of Clubs or Organizations: Not on file  . Attends Archivist Meetings: Not on file  . Marital Status: Not on file  Intimate Partner Violence:   . Fear of Current or Ex-Partner: Not on file  . Emotionally Abused: Not on file  . Physically Abused: Not on file  . Sexually Abused: Not on file     Review of Systems  All other systems reviewed and are negative.      Objective:   Physical Exam Vitals reviewed.  Cardiovascular:     Rate and Rhythm: Normal rate and regular rhythm.     Heart sounds: Normal heart sounds.  Pulmonary:     Effort: Pulmonary effort is normal.     Breath sounds: Normal breath sounds.  Musculoskeletal:     Lumbar back: Spasms and tenderness present. No swelling, deformity, signs of trauma or bony tenderness. Decreased range of motion. Negative right straight leg raise test and negative left straight leg raise test.       Back:           Assessment & Plan:  Strain of lumbar region, initial encounter  I believe the patient is having muscle strain and muscle spasms in her lower back.  I recommended a series of exercises and stretches for her to perform at home to help relieve some of the muscle tightness and stiffness.  I recommended methocarbamol 500 mg 2000 mg every 6 hours as needed for muscle spasm.  She can also take diclofenac 75 mg p.o.  twice daily for muscle pain.  If symptoms or not improving over the next 2 weeks she is to call me back.  If she develops any other symptoms we can proceed with imaging.

## 2019-09-27 ENCOUNTER — Ambulatory Visit: Payer: Medicare Other | Attending: Internal Medicine

## 2019-09-27 ENCOUNTER — Other Ambulatory Visit: Payer: Self-pay

## 2019-09-27 DIAGNOSIS — Z20822 Contact with and (suspected) exposure to covid-19: Secondary | ICD-10-CM | POA: Diagnosis not present

## 2019-09-29 LAB — NOVEL CORONAVIRUS, NAA: SARS-CoV-2, NAA: NOT DETECTED

## 2019-10-13 ENCOUNTER — Ambulatory Visit: Payer: Medicare Other | Attending: Internal Medicine

## 2019-10-13 DIAGNOSIS — N3941 Urge incontinence: Secondary | ICD-10-CM | POA: Diagnosis not present

## 2019-10-13 DIAGNOSIS — Z23 Encounter for immunization: Secondary | ICD-10-CM | POA: Insufficient documentation

## 2019-10-13 DIAGNOSIS — R350 Frequency of micturition: Secondary | ICD-10-CM | POA: Diagnosis not present

## 2019-10-13 NOTE — Progress Notes (Signed)
   Covid-19 Vaccination Clinic  Name:  Allison Freeman    MRN: [id number] DOB: [birth year]  10/13/2019  [name] was observed post Covid-19 immunization for 15 minutes without incidence. She was provided with Vaccine Information Sheet and instruction to access the V-Safe system.   [name] was instructed to call 911 with any severe reactions post vaccine: Marland Kitchen Difficulty breathing  . Swelling of your face and throat  . A fast heartbeat  . A bad rash all over your body  . Dizziness and weakness    Immunizations Administered    Name Date Dose VIS Date Route   Pfizer COVID-19 Vaccine 10/13/2019  3:57 PM 0.3 mL 09/02/2019 Intramuscular   Manufacturer: [place]   Lot: [id number]   [place]: [id number]

## 2019-10-14 ENCOUNTER — Other Ambulatory Visit: Payer: Self-pay | Admitting: Family Medicine

## 2019-10-14 NOTE — Telephone Encounter (Signed)
Requested Prescriptions   Pending Prescriptions Disp Refills  . traMADol (ULTRAM) 50 MG tablet [Pharmacy Med Name: TRAMADOL HCL 50 MG TAB] 270 tablet 0    Sig: TAKE ONE TABLET BY MOUTH THREE TIMES DAILY AS NEEDED     Last OV 09/06/2019   Last written 06/28/2019

## 2019-10-18 ENCOUNTER — Telehealth: Payer: Self-pay | Admitting: Family Medicine

## 2019-10-18 NOTE — Telephone Encounter (Signed)
PA Submitted through [email or web address] and received the following:  Approvedtoday CaseId:[id number];Status:Approved;Review Type:Prior Auth;Coverage Start Date:09/23/2019;Coverage End Date:10/17/2020;

## 2019-10-18 NOTE — Telephone Encounter (Signed)
Pharm made aware

## 2019-11-03 ENCOUNTER — Ambulatory Visit: Payer: Medicare Other | Attending: Internal Medicine

## 2019-11-03 ENCOUNTER — Other Ambulatory Visit: Payer: Self-pay | Admitting: Family Medicine

## 2019-11-03 DIAGNOSIS — Z23 Encounter for immunization: Secondary | ICD-10-CM | POA: Insufficient documentation

## 2019-11-03 NOTE — Progress Notes (Signed)
   Covid-19 Vaccination Clinic  Name:  Allison Freeman    MRN: [id number] DOB: 1945-10-24  11/03/2019  Allison Freeman was observed post Covid-19 immunization for 15 minutes without incidence. She was provided with Vaccine Information Sheet and instruction to access the V-Safe system.   Allison Freeman was instructed to call 911 with any severe reactions post vaccine: Marland Kitchen Difficulty breathing  . Swelling of your face and throat  . A fast heartbeat  . A bad rash all over your body  . Dizziness and weakness    Immunizations Administered    Name Date Dose VIS Date Route   Pfizer COVID-19 Vaccine 11/03/2019  4:42 PM 0.3 mL 09/02/2019 Intramuscular   Manufacturer: [place]   Lot: [id number]   [place]: [id number]

## 2019-11-03 NOTE — Telephone Encounter (Signed)
Ok to refill??  Last office visit 09/06/2019.  Last refill 08/04/2019, #2 refills.

## 2019-11-17 DIAGNOSIS — N3941 Urge incontinence: Secondary | ICD-10-CM | POA: Diagnosis not present

## 2019-11-21 ENCOUNTER — Other Ambulatory Visit: Payer: Self-pay | Admitting: Family Medicine

## 2019-12-08 DIAGNOSIS — Z471 Aftercare following joint replacement surgery: Secondary | ICD-10-CM | POA: Diagnosis not present

## 2019-12-08 DIAGNOSIS — Z96651 Presence of right artificial knee joint: Secondary | ICD-10-CM | POA: Diagnosis not present

## 2019-12-15 DIAGNOSIS — N3941 Urge incontinence: Secondary | ICD-10-CM | POA: Diagnosis not present

## 2020-01-11 ENCOUNTER — Other Ambulatory Visit: Payer: Self-pay | Admitting: Family Medicine

## 2020-01-12 DIAGNOSIS — N3941 Urge incontinence: Secondary | ICD-10-CM | POA: Diagnosis not present

## 2020-01-12 DIAGNOSIS — R350 Frequency of micturition: Secondary | ICD-10-CM | POA: Diagnosis not present

## 2020-01-30 ENCOUNTER — Other Ambulatory Visit: Payer: Self-pay | Admitting: Family Medicine

## 2020-01-30 NOTE — Telephone Encounter (Signed)
Ok to refill??  Last office visit 09/06/2019.  Last refill 10/14/2019.

## 2020-02-01 DIAGNOSIS — L570 Actinic keratosis: Secondary | ICD-10-CM | POA: Diagnosis not present

## 2020-02-01 DIAGNOSIS — L821 Other seborrheic keratosis: Secondary | ICD-10-CM | POA: Diagnosis not present

## 2020-02-01 DIAGNOSIS — L814 Other melanin hyperpigmentation: Secondary | ICD-10-CM | POA: Diagnosis not present

## 2020-02-01 DIAGNOSIS — D1801 Hemangioma of skin and subcutaneous tissue: Secondary | ICD-10-CM | POA: Diagnosis not present

## 2020-02-08 ENCOUNTER — Other Ambulatory Visit: Payer: Self-pay | Admitting: Family Medicine

## 2020-02-09 DIAGNOSIS — N3941 Urge incontinence: Secondary | ICD-10-CM | POA: Diagnosis not present

## 2020-02-09 DIAGNOSIS — R350 Frequency of micturition: Secondary | ICD-10-CM | POA: Diagnosis not present

## 2020-02-09 NOTE — Telephone Encounter (Signed)
Ok to refill??  Last office visit 09/06/2019  Last refill 11/04/2019.

## 2020-02-21 ENCOUNTER — Other Ambulatory Visit: Payer: Self-pay | Admitting: Family Medicine

## 2020-03-05 ENCOUNTER — Ambulatory Visit (INDEPENDENT_AMBULATORY_CARE_PROVIDER_SITE_OTHER): Payer: Medicare Other | Admitting: Family Medicine

## 2020-03-05 ENCOUNTER — Other Ambulatory Visit: Payer: Self-pay

## 2020-03-05 VITALS — BP 140/80 | HR 73 | Temp 98.8°F | Ht 65.0 in | Wt 170.0 lb

## 2020-03-05 DIAGNOSIS — E7800 Pure hypercholesterolemia, unspecified: Secondary | ICD-10-CM

## 2020-03-05 DIAGNOSIS — I10 Essential (primary) hypertension: Secondary | ICD-10-CM

## 2020-03-05 DIAGNOSIS — M8580 Other specified disorders of bone density and structure, unspecified site: Secondary | ICD-10-CM

## 2020-03-05 DIAGNOSIS — Z0000 Encounter for general adult medical examination without abnormal findings: Secondary | ICD-10-CM

## 2020-03-05 DIAGNOSIS — R635 Abnormal weight gain: Secondary | ICD-10-CM | POA: Diagnosis not present

## 2020-03-05 DIAGNOSIS — M818 Other osteoporosis without current pathological fracture: Secondary | ICD-10-CM | POA: Diagnosis not present

## 2020-03-05 LAB — COMPLETE METABOLIC PANEL WITH GFR
AG Ratio: 1.5 (calc) (ref 1.0–2.5)
ALT: 13 U/L (ref 6–29)
AST: 17 U/L (ref 10–35)
Albumin: 3.8 g/dL (ref 3.6–5.1)
Alkaline phosphatase (APISO): 50 U/L (ref 37–153)
BUN: 22 mg/dL (ref 7–25)
CO2: 28 mmol/L (ref 20–32)
Calcium: 8.9 mg/dL (ref 8.6–10.4)
Chloride: 107 mmol/L (ref 98–110)
Creat: 0.91 mg/dL (ref 0.60–0.93)
GFR, Est African American: 74 mL/min/{1.73_m2} (ref >=60)
GFR, Est Non African American: 63 mL/min/{1.73_m2} (ref >=60)
Globulin: 2.5 g/dL (calc) (ref 1.9–3.7)
Glucose, Bld: 97 mg/dL (ref 65–99)
Potassium: 4.3 mmol/L (ref 3.5–5.3)
Sodium: 141 mmol/L (ref 135–146)
Total Bilirubin: 1.3 mg/dL — ABNORMAL HIGH (ref 0.2–1.2)
Total Protein: 6.3 g/dL (ref 6.1–8.1)

## 2020-03-05 LAB — LIPID PANEL
Cholesterol: 168 mg/dL (ref <200)
HDL: 51 mg/dL (ref >=50)
LDL Cholesterol (Calc): 96 mg/dL (calc)
Non-HDL Cholesterol (Calc): 117 mg/dL (calc) (ref <130)
Total CHOL/HDL Ratio: 3.3 (calc) (ref <5.0)
Triglycerides: 114 mg/dL (ref <150)

## 2020-03-05 LAB — CBC WITH DIFFERENTIAL/PLATELET
Absolute Monocytes: 493 cells/uL (ref 200–950)
Basophils Absolute: 28 cells/uL (ref 0–200)
Basophils Relative: 0.5 %
Eosinophils Absolute: 140 cells/uL (ref 15–500)
Eosinophils Relative: 2.5 %
HCT: 37.9 % (ref 35.0–45.0)
Hemoglobin: 12.7 g/dL (ref 11.7–15.5)
Lymphs Abs: 1859 cells/uL (ref 850–3900)
MCH: 30.6 pg (ref 27.0–33.0)
MCHC: 33.5 g/dL (ref 32.0–36.0)
MCV: 91.3 fL (ref 80.0–100.0)
MPV: 10.9 fL (ref 7.5–12.5)
Monocytes Relative: 8.8 %
Neutro Abs: 3080 cells/uL (ref 1500–7800)
Neutrophils Relative %: 55 %
Platelets: 237 10*3/uL (ref 140–400)
RBC: 4.15 10*6/uL (ref 3.80–5.10)
RDW: 12.9 % (ref 11.0–15.0)
Total Lymphocyte: 33.2 %
WBC: 5.6 10*3/uL (ref 3.8–10.8)

## 2020-03-05 LAB — TSH: TSH: 2.15 mIU/L (ref 0.40–4.50)

## 2020-03-05 NOTE — Progress Notes (Signed)
Subjective:    Patient ID: Allison Freeman, female    DOB: 03/24/1946, 74 y.o.   MRN: [id number]  HPI  Patient is a very pleasant 74 year old Caucasian female here today for complete physical exam.  She is due for fasting lab work.  Her blood pressure today is adequately controlled at 140/80.  Her immunizations are up-to-date as evidenced below except for a tetanus shot.  She politely declines a tetanus shot today.  Her last mammogram was August 2020 and was normal.  Her last colonoscopy was in 2020 and showed only diverticulosis and internal hemorrhoids.  She has a history of a hysterectomy and therefore does not require a Pap smear.  Her last bone density test was performed in 2020 and did show osteoporosis with a T score of -2.5 in the spine.  She is taking calcium and vitamin D.  She will be due to repeat a bone density test next year.  She is receiving Prolia.  She has a history of severe acid reflux and is having to take Protonix twice daily.  If she does not take the medication, she has severe reflux and therefore she feels that she still needs to take the medication.  She denies any falls, depression, or memory loss.  She does report some pain in her left ear whenever she lies on it at night however visibly there is no abnormality seen on her exam. Immunization History  Administered Date(s) Administered  . Influenza,inj,Quad PF,6+ Mos 06/23/2014, 06/27/2015, 06/27/2016  . Influenza-Unspecified 06/30/2013, 07/14/2017, 07/06/2018  . PFIZER SARS-COV-2 Vaccination 10/13/2019, 11/03/2019  . Pneumococcal Conjugate-13 06/23/2014  . Pneumococcal Polysaccharide-23 01/28/2016  . Zoster Recombinat (Shingrix) 11/27/2017, 03/01/2018   Past Medical History:  Diagnosis Date  . Acid reflux   . Arthritis   . Depression   . Family history of adverse reaction to anesthesia    Mother woke up and could hearduring surgery  . Hypercholesterolemia   . Hypertension   . Insomnia   . Osteoporosis   .  Panic attacks    panic attacks  . Reflux   . Restless leg syndrome    Past Surgical History:  Procedure Laterality Date  . ABDOMINAL HYSTERECTOMY  2001  . BLADDER SURGERY    . BREAST BIOPSY  1987  . BREAST CYST EXCISION  1998  . CARPAL TUNNEL RELEASE Right 04/06/2015   Procedure: CARPAL TUNNEL RELEASE;  Surgeon: Carole Civil, MD;  Location: [facility];  Service: Orthopedics;  Laterality: Right;  . COLONOSCOPY  09/19/2004   CVE:LFYBOFBP hemorrhoids, otherwise normal rectum/Sigmoid diverticula.  Remainder of colon mucosa appeared normal  . COLONOSCOPY WITH ESOPHAGOGASTRODUODENOSCOPY (EGD) N/A 06/24/2013   ZWC:HENIDPO polyp-removed. Small hiatal hernia. Abnormal gastric mucosa-query NSAID effect-s/p (chronic inactive gastritis, negative H. pylori)/TCS: Prominent internal hemorrhoids-likely source of hematochezia. Colonic diverticulosis. Colonic polyp-removed (tubular adenoma)  . COLONOSCOPY WITH PROPOFOL N/A 11/01/2018   Procedure: COLONOSCOPY WITH PROPOFOL;  Surgeon: Daneil Dolin, MD;  Location: [facility];  Service: Endoscopy;  Laterality: N/A;  7:30am  . CYSTOCELE REPAIR  2010  . DILATION AND CURETTAGE OF UTERUS    . JOINT REPLACEMENT N/A    Phreesia 03/02/2020  . KNEE SURGERY Right 2003   arthroscopic left  . RECTAL SURGERY    . RECTOCELE REPAIR  2010  . TONSILLECTOMY    . TOTAL KNEE ARTHROPLASTY Right 05/09/2019   Procedure: TOTAL KNEE ARTHROPLASTY;  Surgeon: Vickey Huger, MD;  Location: [facility];  Service: Orthopedics;  Laterality: Right;   Current  Outpatient Medications on File Prior to Visit  Medication Sig Dispense Refill  . acyclovir (ZOVIRAX) 400 MG tablet Take 400 mg by mouth 3 (three) times daily.    Marland Kitchen ALPRAZolam (XANAX) 0.5 MG tablet TAKE ONE TABLET BY MOUTH EVERY NIGHT AT BEDTIME 30 tablet 2  . Black Pepper-Turmeric (TURMERIC COMPLEX/BLACK PEPPER PO) Take 1 tablet by mouth daily.    . Calcium-Magnesium-Vitamin D (CALCIUM 1200+D3 PO) Take 1 tablet by mouth daily.     . Carboxymethylcellul-Glycerin (LUBRICATING EYE DROPS OP) Place 1 drop into both eyes 2 (two) times daily.    Marland Kitchen denosumab (PROLIA) 60 MG/ML SOSY injection Inject 60 mg into the skin every 6 (six) months.    . fluticasone (FLONASE) 50 MCG/ACT nasal spray Use 2 sprays in each  nostril once daily (Patient taking differently: Place 2 sprays into both nostrils at bedtime. ) 48 g 3  . levocetirizine (XYZAL) 5 MG tablet Take 5 mg by mouth daily.    Marland Kitchen losartan (COZAAR) 100 MG tablet TAKE ONE TABLET (100MG  TOTAL) BY MOUTH DAILY 90 tablet 3  . MELATONIN ER PO Take by mouth.    . Misc Natural Products (GLUCOSAMINE CHONDROITIN TRIPLE) TABS Take 1 tablet by mouth 2 (two) times daily.    . Multiple Vitamin (MULTIVITAMIN) tablet Take 1 tablet by mouth daily.    . Omega-3 Fatty Acids (FISH OIL) 1200 MG CAPS Take 1,200 mg by mouth 2 (two) times daily.     . pantoprazole (PROTONIX) 40 MG tablet TAKE ONE TABLET TWICE DAILY BEFORE MEALS 180 tablet 0  . pravastatin (PRAVACHOL) 40 MG tablet TAKE ONE (1) TABLET BY MOUTH EVERY DAY 90 tablet 1  . traMADol (ULTRAM) 50 MG tablet TAKE ONE TABLET BY MOUTH THREE TIMES DAILY AS NEEDED 270 tablet 0   Current Facility-Administered Medications on File Prior to Visit  Medication Dose Route Frequency Provider Last Rate Last Admin  . denosumab (PROLIA) injection 60 mg  60 mg Subcutaneous Q6 months Susy Frizzle, MD   60 mg at 03/05/20 0840   Allergies  Allergen Reactions  . Other Other (See Comments)    Arthritis medications- causes a bacteria in the abdomen   Social History   Socioeconomic History  . Marital status: Married    Spouse name: Not on file  . Number of children: Not on file  . Years of education: Not on file  . Highest education level: Not on file  Occupational History  . Not on file  Tobacco Use  . Smoking status: Never Smoker  . Smokeless tobacco: Never Used  Vaping Use  . Vaping Use: Never used  Substance and Sexual Activity  . Alcohol use: No   . Drug use: No  . Sexual activity: Yes    Birth control/protection: Surgical  Other Topics Concern  . Not on file  Social History Narrative  . Not on file   Social Determinants of Health   Financial Resource Strain:   . Difficulty of Paying Living Expenses:   Food Insecurity:   . Worried About Charity fundraiser in the Last Year:   . Arboriculturist in the Last Year:   Transportation Needs:   . Film/video editor (Medical):   Marland Kitchen Lack of Transportation (Non-Medical):   Physical Activity:   . Days of Exercise per Week:   . Minutes of Exercise per Session:   Stress:   . Feeling of Stress :   Social Connections:   . Frequency of Communication  with Friends and Family:   . Frequency of Social Gatherings with Friends and Family:   . Attends Religious Services:   . Active Member of Clubs or Organizations:   . Attends Archivist Meetings:   Marland Kitchen Marital Status:   Intimate Partner Violence:   . Fear of Current or Ex-Partner:   . Emotionally Abused:   Marland Kitchen Physically Abused:   . Sexually Abused:    Colonoscopy was in 2014 and is up-to-date.  Mammogram was checked in August and is up-to-date.  Patient has a history of a hysterectomy and therefore does not require a Pap smear.  Review of Systems  All other systems reviewed and are negative.      Objective:   Physical Exam Vitals reviewed.  Constitutional:      General: She is not in acute distress.    Appearance: She is well-developed. She is not diaphoretic.  HENT:     Head: Normocephalic and atraumatic.     Right Ear: External ear normal.     Left Ear: External ear normal.     Nose: Nose normal.     Mouth/Throat:     Pharynx: No oropharyngeal exudate.  Eyes:     General: No scleral icterus.       Right eye: No discharge.        Left eye: No discharge.     Conjunctiva/sclera: Conjunctivae normal.     Pupils: Pupils are equal, round, and reactive to light.  Neck:     Thyroid: No thyromegaly.     Vascular:  No JVD.     Trachea: No tracheal deviation.  Cardiovascular:     Rate and Rhythm: Normal rate and regular rhythm.     Heart sounds: Normal heart sounds. No murmur heard.  No friction rub. No gallop.   Pulmonary:     Effort: Pulmonary effort is normal. No respiratory distress.     Breath sounds: Normal breath sounds. No stridor. No wheezing or rales.  Chest:     Chest wall: No tenderness.  Abdominal:     General: Bowel sounds are normal. There is no distension.     Palpations: Abdomen is soft. There is no mass.     Tenderness: There is no abdominal tenderness. There is no guarding or rebound.  Musculoskeletal:        General: No tenderness or deformity. Normal range of motion.     Cervical back: Normal range of motion and neck supple.  Lymphadenopathy:     Cervical: No cervical adenopathy.  Skin:    General: Skin is warm.     Coloration: Skin is not pale.     Findings: No erythema or rash.  Neurological:     Mental Status: She is alert and oriented to person, place, and time.     Cranial Nerves: No cranial nerve deficit.     Motor: No abnormal muscle tone.     Coordination: Coordination normal.     Deep Tendon Reflexes: Reflexes are normal and symmetric.  Psychiatric:        Behavior: Behavior normal.        Thought Content: Thought content normal.        Judgment: Judgment normal.           Assessment & Plan:  Other osteoporosis, unspecified pathological fracture presence  Pure hypercholesterolemia - Plan: CBC with Differential/Platelet, COMPLETE METABOLIC PANEL WITH GFR, Lipid panel  Weight gain - Plan: TSH  General medical exam  Benign essential  HTN  Blood pressure is acceptable today.  Check a CBC, CMP, fasting lipid panel.  Goal LDL cholesterol is less than 130.  Mammogram is due in August.  She does not require Pap smear.  Colonoscopy is up-to-date.  Bone density test is due next year.  She has experienced weight gain despite continue to exercise and eat  consistently therefore she would like to check her thyroid.  She declines a tetanus shot.  The remainder of her immunizations are up-to-date.  She denies any falls, depression, or memory loss.  Regular anticipatory guidance is provided.  Covid vaccination is up-to-date.

## 2020-03-09 DIAGNOSIS — Z8249 Family history of ischemic heart disease and other diseases of the circulatory system: Secondary | ICD-10-CM | POA: Diagnosis not present

## 2020-03-09 DIAGNOSIS — E7800 Pure hypercholesterolemia, unspecified: Secondary | ICD-10-CM | POA: Diagnosis not present

## 2020-03-09 DIAGNOSIS — I10 Essential (primary) hypertension: Secondary | ICD-10-CM | POA: Diagnosis not present

## 2020-03-22 DIAGNOSIS — Z8249 Family history of ischemic heart disease and other diseases of the circulatory system: Secondary | ICD-10-CM | POA: Diagnosis not present

## 2020-03-22 DIAGNOSIS — I10 Essential (primary) hypertension: Secondary | ICD-10-CM | POA: Diagnosis not present

## 2020-03-22 DIAGNOSIS — E7800 Pure hypercholesterolemia, unspecified: Secondary | ICD-10-CM | POA: Diagnosis not present

## 2020-04-09 ENCOUNTER — Other Ambulatory Visit (HOSPITAL_COMMUNITY): Payer: Self-pay | Admitting: Family Medicine

## 2020-04-09 DIAGNOSIS — Z1231 Encounter for screening mammogram for malignant neoplasm of breast: Secondary | ICD-10-CM

## 2020-04-26 ENCOUNTER — Telehealth: Payer: Self-pay | Admitting: Family Medicine

## 2020-04-26 DIAGNOSIS — N3941 Urge incontinence: Secondary | ICD-10-CM | POA: Diagnosis not present

## 2020-04-26 DIAGNOSIS — R350 Frequency of micturition: Secondary | ICD-10-CM | POA: Diagnosis not present

## 2020-04-26 NOTE — Progress Notes (Signed)
  Chronic Care Management   Outreach Note  04/26/2020 Name: Allison Freeman MRN: [id number] DOB: 22-Nov-1945  Referred by: Susy Frizzle, MD Reason for referral : No chief complaint on file.   An unsuccessful telephone outreach was attempted today. The patient was referred to the pharmacist for assistance with care management and care coordination.   Follow Up Plan:   Carley Perdue [employer] Scheduler

## 2020-04-27 ENCOUNTER — Other Ambulatory Visit: Payer: Self-pay

## 2020-04-27 ENCOUNTER — Inpatient Hospital Stay (HOSPITAL_COMMUNITY): Admission: RE | Admit: 2020-04-27 | Payer: Medicare Other | Source: Ambulatory Visit

## 2020-04-27 ENCOUNTER — Ambulatory Visit (HOSPITAL_COMMUNITY)
Admission: RE | Admit: 2020-04-27 | Discharge: 2020-04-27 | Disposition: A | Discharge status: Home / Self Care | Payer: Medicare Other | Source: Ambulatory Visit | Attending: Family Medicine | Admitting: Family Medicine

## 2020-04-27 DIAGNOSIS — Z1231 Encounter for screening mammogram for malignant neoplasm of breast: Secondary | ICD-10-CM | POA: Diagnosis not present

## 2020-05-01 ENCOUNTER — Ambulatory Visit (INDEPENDENT_AMBULATORY_CARE_PROVIDER_SITE_OTHER): Payer: Medicare Other | Admitting: Family Medicine

## 2020-05-01 ENCOUNTER — Other Ambulatory Visit: Payer: Self-pay

## 2020-05-01 VITALS — BP 110/78 | HR 71 | Temp 95.4°F | Ht 65.0 in | Wt 171.0 lb

## 2020-05-01 DIAGNOSIS — M545 Low back pain, unspecified: Secondary | ICD-10-CM

## 2020-05-01 MED ORDER — CYCLOBENZAPRINE HCL 10 MG PO TABS: 10.0000 mg | ORAL_TABLET | Freq: Three times a day (TID) | ORAL | 0 refills | Status: DC | PRN

## 2020-05-01 NOTE — Progress Notes (Signed)
Subjective:    Patient ID: Allison Freeman, female    DOB: [birth year], 74 y.o.   MRN: [id number]  HPI  I saw the patient for back pain in December.  She states that her back has been hurting ever since that time.  The pain is primarily to the right of the spine roughly at the level of L4-L5.  Occasionally will radiate into her posterior right hip but primarily is an aching throbbing pain in that area.  It keeps her awake at night.  She hurts if she lays flat on her back.  She also hurts if she turns and lays on her side.  She has bursitis in both hips and therefore it is difficult for her to sleep.  She denies any weakness in her legs.  She denies any saddle anesthesia.  She denies any bowel or bladder incontinence.  She denies any paresthesias or burning or stinging pain in her legs.  MRI in 2014 did show severe facet arthritis at L4-L5 with possible nerve impingement at that level of the descending nerve root.  I believe this is the most likely source of her pain although she has not had imaging in quite some time Past Medical History:  Diagnosis Date  . Acid reflux   . Arthritis   . Depression   . Family history of adverse reaction to anesthesia    Mother woke up and could hearduring surgery  . Hypercholesterolemia   . Hypertension   . Insomnia   . Osteoporosis   . Panic attacks    panic attacks  . Reflux   . Restless leg syndrome    Past Surgical History:  Procedure Laterality Date  . ABDOMINAL HYSTERECTOMY  2001  . BLADDER SURGERY    . BREAST BIOPSY  1987  . BREAST CYST EXCISION  1998  . CARPAL TUNNEL RELEASE Right 04/06/2015   Procedure: CARPAL TUNNEL RELEASE;  Surgeon: Carole Civil, MD;  Location: [facility];  Service: Orthopedics;  Laterality: Right;  . COLONOSCOPY  09/19/2004   YNW:GNFAOZHY hemorrhoids, otherwise normal rectum/Sigmoid diverticula.  Remainder of colon mucosa appeared normal  . COLONOSCOPY WITH ESOPHAGOGASTRODUODENOSCOPY (EGD) N/A 06/24/2013    QMV:HQIONGE polyp-removed. Small hiatal hernia. Abnormal gastric mucosa-query NSAID effect-s/p (chronic inactive gastritis, negative H. pylori)/TCS: Prominent internal hemorrhoids-likely source of hematochezia. Colonic diverticulosis. Colonic polyp-removed (tubular adenoma)  . COLONOSCOPY WITH PROPOFOL N/A 11/01/2018   Procedure: COLONOSCOPY WITH PROPOFOL;  Surgeon: Daneil Dolin, MD;  Location: [facility];  Service: Endoscopy;  Laterality: N/A;  7:30am  . CYSTOCELE REPAIR  2010  . DILATION AND CURETTAGE OF UTERUS    . JOINT REPLACEMENT N/A    Phreesia 03/02/2020  . KNEE SURGERY Right 2003   arthroscopic left  . RECTAL SURGERY    . RECTOCELE REPAIR  2010  . TONSILLECTOMY    . TOTAL KNEE ARTHROPLASTY Right 05/09/2019   Procedure: TOTAL KNEE ARTHROPLASTY;  Surgeon: Vickey Huger, MD;  Location: [facility];  Service: Orthopedics;  Laterality: Right;   Current Outpatient Medications on File Prior to Visit  Medication Sig Dispense Refill  . acyclovir (ZOVIRAX) 400 MG tablet Take 400 mg by mouth 3 (three) times daily.    Marland Kitchen ALPRAZolam (XANAX) 0.5 MG tablet TAKE ONE TABLET BY MOUTH EVERY NIGHT AT BEDTIME 30 tablet 2  . Black Pepper-Turmeric (TURMERIC COMPLEX/BLACK PEPPER PO) Take 1 tablet by mouth daily.    . Calcium-Magnesium-Vitamin D (CALCIUM 1200+D3 PO) Take 1 tablet by mouth daily.    Marland Kitchen  Carboxymethylcellul-Glycerin (LUBRICATING EYE DROPS OP) Place 1 drop into both eyes 2 (two) times daily.    Marland Kitchen denosumab (PROLIA) 60 MG/ML SOSY injection Inject 60 mg into the skin every 6 (six) months.    . fluticasone (FLONASE) 50 MCG/ACT nasal spray Use 2 sprays in each  nostril once daily (Patient taking differently: Place 2 sprays into both nostrils at bedtime. ) 48 g 3  . levocetirizine (XYZAL) 5 MG tablet Take 5 mg by mouth daily.    Marland Kitchen losartan (COZAAR) 100 MG tablet TAKE ONE TABLET (100MG  TOTAL) BY MOUTH DAILY 90 tablet 3  . MELATONIN ER PO Take by mouth.    . Misc Natural Products (GLUCOSAMINE  CHONDROITIN TRIPLE) TABS Take 1 tablet by mouth 2 (two) times daily.    . Multiple Vitamin (MULTIVITAMIN) tablet Take 1 tablet by mouth daily.    . Omega-3 Fatty Acids (FISH OIL) 1200 MG CAPS Take 1,200 mg by mouth 2 (two) times daily.     . pantoprazole (PROTONIX) 40 MG tablet TAKE ONE TABLET TWICE DAILY BEFORE MEALS 180 tablet 0  . pravastatin (PRAVACHOL) 40 MG tablet TAKE ONE (1) TABLET BY MOUTH EVERY DAY 90 tablet 1  . traMADol (ULTRAM) 50 MG tablet TAKE ONE TABLET BY MOUTH THREE TIMES DAILY AS NEEDED 270 tablet 0   Current Facility-Administered Medications on File Prior to Visit  Medication Dose Route Frequency Provider Last Rate Last Admin  . denosumab (PROLIA) injection 60 mg  60 mg Subcutaneous Q6 months Susy Frizzle, MD   60 mg at 03/05/20 0840   Allergies  Allergen Reactions  . Other Other (See Comments)    Arthritis medications- causes a bacteria in the abdomen   Social History   Socioeconomic History  . Marital status: Married    Spouse name: Not on file  . Number of children: Not on file  . Years of education: Not on file  . Highest education level: Not on file  Occupational History  . Not on file  Tobacco Use  . Smoking status: Never Smoker  . Smokeless tobacco: Never Used  Vaping Use  . Vaping Use: Never used  Substance and Sexual Activity  . Alcohol use: No  . Drug use: No  . Sexual activity: Yes    Birth control/protection: Surgical  Other Topics Concern  . Not on file  Social History Narrative  . Not on file   Social Determinants of Health   Financial Resource Strain:   . Difficulty of Paying Living Expenses:   Food Insecurity:   . Worried About Charity fundraiser in the Last Year:   . Arboriculturist in the Last Year:   Transportation Needs:   . Film/video editor (Medical):   Marland Kitchen Lack of Transportation (Non-Medical):   Physical Activity:   . Days of Exercise per Week:   . Minutes of Exercise per Session:   Stress:   . Feeling of  Stress :   Social Connections:   . Frequency of Communication with Friends and Family:   . Frequency of Social Gatherings with Friends and Family:   . Attends Religious Services:   . Active Member of Clubs or Organizations:   . Attends Archivist Meetings:   Marland Kitchen Marital Status:   Intimate Partner Violence:   . Fear of Current or Ex-Partner:   . Emotionally Abused:   Marland Kitchen Physically Abused:   . Sexually Abused:      Review of Systems  Musculoskeletal: Positive  for back pain.  All other systems reviewed and are negative.      Objective:   Physical Exam Vitals reviewed.  Cardiovascular:     Rate and Rhythm: Normal rate and regular rhythm.     Heart sounds: Normal heart sounds.  Pulmonary:     Effort: Pulmonary effort is normal.     Breath sounds: Normal breath sounds.  Musculoskeletal:     Lumbar back: Spasms and tenderness present. No swelling, deformity, signs of trauma or bony tenderness. Decreased range of motion. Negative right straight leg raise test and negative left straight leg raise test.       Back:           Assessment & Plan:  Right low back pain, unspecified chronicity, unspecified whether sciatica present - Plan: DG Lumbar Spine Complete  I believe most of her pain is due to facet joint arthritis at L4-L5.  Begin with an x-ray of the lumbar spine to evaluate further.  Patient is tried NSAIDs but they irritate her stomach.  Tramadol provides her minimal relief.  She is interested to see if she has other options to help manage her pain.  I have recommended taking at 1000 mg of Tylenol in the morning and at 1000 mg of Tylenol in the afternoon.  She can then use Flexeril 10 mg p.o. nightly to help her sleep better and relax compensatory muscle spasms.  This can be done in addition to the tramadol.  Patient may benefit from a facet joint injection depending upon x-ray results.

## 2020-05-02 ENCOUNTER — Ambulatory Visit (HOSPITAL_COMMUNITY)
Admission: RE | Admit: 2020-05-02 | Discharge: 2020-05-02 | Disposition: A | Discharge status: Home / Self Care | Payer: Medicare Other | Source: Ambulatory Visit | Attending: Family Medicine | Admitting: Family Medicine

## 2020-05-02 ENCOUNTER — Telehealth: Payer: Self-pay | Admitting: Family Medicine

## 2020-05-02 DIAGNOSIS — M545 Low back pain, unspecified: Secondary | ICD-10-CM

## 2020-05-02 NOTE — Progress Notes (Signed)
  Chronic Care Management   Outreach Note  05/02/2020 Name: Allison Freeman MRN: [id number] DOB: 07-02-1946  Referred by: Susy Frizzle, MD Reason for referral : No chief complaint on file.   A second unsuccessful telephone outreach was attempted today. The patient was referred to pharmacist for assistance with care management and care coordination.  Follow Up Plan:   Carley Perdue [employer] Scheduler

## 2020-05-07 DIAGNOSIS — M79672 Pain in left foot: Secondary | ICD-10-CM | POA: Diagnosis not present

## 2020-05-07 DIAGNOSIS — M85672 Other cyst of bone, left ankle and foot: Secondary | ICD-10-CM | POA: Diagnosis not present

## 2020-05-07 DIAGNOSIS — M1990 Unspecified osteoarthritis, unspecified site: Secondary | ICD-10-CM | POA: Diagnosis not present

## 2020-05-09 ENCOUNTER — Telehealth: Payer: Self-pay | Admitting: Family Medicine

## 2020-05-09 NOTE — Progress Notes (Signed)
  Chronic Care Management   Outreach Note  05/09/2020 Name: Allison Freeman MRN: [id number] DOB: Feb 14, 1946  Referred by: Susy Frizzle, MD Reason for referral : No chief complaint on file.   Third unsuccessful telephone outreach was attempted today. The patient was referred to the pharmacist for assistance with care management and care coordination.   Follow Up Plan:   Carley Perdue [employer] Scheduler

## 2020-05-24 ENCOUNTER — Other Ambulatory Visit: Payer: Self-pay | Admitting: Family Medicine

## 2020-05-24 DIAGNOSIS — N3941 Urge incontinence: Secondary | ICD-10-CM | POA: Diagnosis not present

## 2020-05-24 DIAGNOSIS — R350 Frequency of micturition: Secondary | ICD-10-CM | POA: Diagnosis not present

## 2020-05-24 NOTE — Telephone Encounter (Signed)
Ok to refill??  Last office visit 8/10/201.  Last refill Xanax 02/09/2020.  Last refill on Flexeril 05/01/2020.  Last refill on Tramadol 5/10/221.

## 2020-06-06 ENCOUNTER — Other Ambulatory Visit: Payer: Self-pay | Admitting: *Deleted

## 2020-06-06 ENCOUNTER — Other Ambulatory Visit: Payer: Self-pay

## 2020-06-06 ENCOUNTER — Other Ambulatory Visit: Payer: Medicare Other

## 2020-06-06 ENCOUNTER — Encounter: Payer: Self-pay | Admitting: Family Medicine

## 2020-06-06 DIAGNOSIS — Z20822 Contact with and (suspected) exposure to covid-19: Secondary | ICD-10-CM | POA: Diagnosis not present

## 2020-06-08 LAB — NOVEL CORONAVIRUS, NAA: SARS-CoV-2, NAA: DETECTED — AB

## 2020-06-08 LAB — SARS-COV-2, NAA 2 DAY TAT

## 2020-06-09 ENCOUNTER — Telehealth: Payer: Self-pay | Admitting: Family

## 2020-06-09 ENCOUNTER — Encounter: Payer: Self-pay | Admitting: Family Medicine

## 2020-06-09 ENCOUNTER — Other Ambulatory Visit: Payer: Self-pay | Admitting: Family

## 2020-06-09 DIAGNOSIS — E785 Hyperlipidemia, unspecified: Secondary | ICD-10-CM

## 2020-06-09 DIAGNOSIS — U071 COVID-19: Secondary | ICD-10-CM

## 2020-06-09 NOTE — Telephone Encounter (Signed)
Called to Discuss with patient about Covid symptoms and the use of the monoclonal antibody infusion for those with mild to moderate Covid symptoms and at a high risk of hospitalization.     Pt appears to qualify for this infusion due to co-morbid conditions and/or a member of an at-risk group in accordance with the FDA Emergency Use Authorization.    Allison Freeman was diagnosed with positive COVID test on 9/15. Symptoms started on 9/14. Currently experiencing cough, diarrha, body aches, headache, and fever. Qualifying risk factors include age, hypertension and hyperlipidemia.   Spoke with Allison Freeman regarding the risks and benefits of treatment with Regeneron and she wishes to continue with treatment.   Allison Freeman,   You have been scheduled to receive Regeneron (the monoclonal antibody we discussed) on : 06/10/20 at 2:30pm   If you have been tested outside of a [facility] - you MUST bring a copy of your positive test with you the morning of your appointment. You may take a photo of this and upload to your MyChart portal or have the testing facility fax the result to [phone]    The address for the infusion clinic site is:  --GPS address is [place] - the parking is located near [employer] building where you will see  COVID19 Infusion feather banner marking the entrance to parking.   (see photos below)            --Enter into the 2nd entrance where the "wave, flag banner" is at the road. Turn into this 2nd entrance and immediately turn left to park in 1 of the 5 parking spots.   --Please stay in your car and call the desk for assistance inside [phone].   --Average time in department is roughly 2 hours for Regeneron treatment - this includes preparation of the medication, IV start and the required 1 hour monitoring after the infusion.    Should you develop worsening shortness of breath, chest pain or severe breathing problems please do not  wait for this appointment and go to the Emergency room for evaluation and treatment. You will undergo another oxygen screen before your infusion to ensure this is the best treatment option for you. There is a chance that the best decision may be to send you to the Emergency Room for evaluation at the time of your appointment.   The day of your visit you should: Marland Kitchen Get plenty of rest the night before and drink plenty of water . Eat a light meal/snack before coming and take your medications as prescribed  . Wear warm, comfortable clothes with a shirt that can roll-up over the elbow (will need IV start).  . Wear a mask  . Consider bringing some activity to help pass the time  Many commercial insurers are waiving bills related to [place] treatment however some have ranged from $300-640. We are starting to see some insurers send bills to patients later for the administration of the medication - we are learning more information but you may receive a bill after your appointment.  Please contact your insurance agent to discuss prior to your appointment if you would like further details about billing specific to your policy.    The CPT code is [phone] for your reference.    Terri Piedra, NP 06/09/2020 3:11 PM

## 2020-06-09 NOTE — Progress Notes (Signed)
I connected by phone with Allison Freeman on 06/09/2020 at 3:11 PM to discuss the potential use of a new treatment for mild to moderate COVID-19 viral infection in non-hospitalized patients.  This patient is a 74 y.o. female that meets the FDA criteria for Emergency Use Authorization of COVID monoclonal antibody casirivimab/imdevimab.  Has a (+) direct SARS-CoV-2 viral test result  Has mild or moderate COVID-19   Is NOT hospitalized due to COVID-19  Is within 10 days of symptom onset  Has at least one of the high risk factor(s) for progression to severe COVID-19 and/or hospitalization as defined in EUA.  Specific high risk criteria : Older age (>/= 71 yo) and Cardiovascular disease or hypertension   I have spoken and communicated the following to the patient or parent/caregiver regarding COVID monoclonal antibody treatment:  1. FDA has authorized the emergency use for the treatment of mild to moderate COVID-19 in adults and pediatric patients with positive results of direct SARS-CoV-2 viral testing who are 30 years of age and older weighing at least 40 kg, and who are at high risk for progressing to severe COVID-19 and/or hospitalization.  2. The significant known and potential risks and benefits of COVID monoclonal antibody, and the extent to which such potential risks and benefits are unknown.  3. Information on available alternative treatments and the risks and benefits of those alternatives, including clinical trials.  4. Patients treated with COVID monoclonal antibody should continue to self-isolate and use infection control measures (e.g., wear mask, isolate, social distance, avoid sharing personal items, clean and disinfect "high touch" surfaces, and frequent handwashing) according to CDC guidelines.   5. The patient or parent/caregiver has the option to accept or refuse COVID monoclonal antibody treatment.  After reviewing this information with the patient, The patient agreed  to proceed with receiving casirivimab\imdevimab infusion and will be provided a copy of the Fact sheet prior to receiving the infusion.   Mauricio Po, NP 06/09/2020 3:11 PM

## 2020-06-10 ENCOUNTER — Ambulatory Visit (HOSPITAL_COMMUNITY)
Admission: RE | Admit: 2020-06-10 | Discharge: 2020-06-10 | Disposition: A | Discharge status: Home / Self Care | Payer: Medicare Other | Source: Ambulatory Visit | Attending: Pulmonary Disease | Admitting: Pulmonary Disease

## 2020-06-10 DIAGNOSIS — E785 Hyperlipidemia, unspecified: Secondary | ICD-10-CM | POA: Insufficient documentation

## 2020-06-10 DIAGNOSIS — Z23 Encounter for immunization: Secondary | ICD-10-CM | POA: Diagnosis not present

## 2020-06-10 DIAGNOSIS — U071 COVID-19: Secondary | ICD-10-CM | POA: Diagnosis not present

## 2020-06-10 MED ORDER — EPINEPHRINE 0.3 MG/0.3ML IJ SOAJ: 0.3000 mg | Freq: Once | INTRAMUSCULAR | Status: DC | PRN

## 2020-06-10 MED ORDER — SODIUM CHLORIDE 0.9 % IV SOLN: INTRAVENOUS | Status: DC | PRN

## 2020-06-10 MED ORDER — FAMOTIDINE IN NACL 20-0.9 MG/50ML-% IV SOLN: 20.0000 mg | Freq: Once | INTRAVENOUS | Status: DC | PRN

## 2020-06-10 MED ORDER — SODIUM CHLORIDE 0.9 % IV SOLN
1200.0000 mg | Freq: Once | INTRAVENOUS | Status: AC
  Administered 2020-06-10: 1200 mg via INTRAVENOUS

## 2020-06-10 MED ORDER — ALBUTEROL SULFATE HFA 108 (90 BASE) MCG/ACT IN AERS: 2.0000 | INHALATION_SPRAY | Freq: Once | RESPIRATORY_TRACT | Status: DC | PRN

## 2020-06-10 MED ORDER — METHYLPREDNISOLONE SODIUM SUCC 125 MG IJ SOLR: 125.0000 mg | Freq: Once | INTRAMUSCULAR | Status: DC | PRN

## 2020-06-10 MED ORDER — DIPHENHYDRAMINE HCL 50 MG/ML IJ SOLN: 50.0000 mg | Freq: Once | INTRAMUSCULAR | Status: DC | PRN

## 2020-06-10 NOTE — Discharge Instructions (Signed)

## 2020-06-10 NOTE — Progress Notes (Signed)
°  Diagnosis: COVID-19 ° °Physician: Wright, MD ° °Procedure: Covid Infusion Clinic Med: casirivimab\imdevimab infusion - Provided patient with casirivimab\imdevimab fact sheet for patients, parents and caregivers prior to infusion. ° °Complications: No immediate complications noted. ° °Discharge: Discharged home  ° °Tiyonna Sardinha R Masiah Lewing °06/10/2020 ° ° °

## 2020-06-11 NOTE — Telephone Encounter (Signed)
Information regarding infusion was given to patient from ordering physician.

## 2020-06-21 ENCOUNTER — Encounter: Payer: Self-pay | Admitting: Emergency Medicine

## 2020-06-21 ENCOUNTER — Other Ambulatory Visit: Payer: Self-pay

## 2020-06-21 ENCOUNTER — Telehealth: Payer: Self-pay | Admitting: Family Medicine

## 2020-06-21 ENCOUNTER — Ambulatory Visit
Admission: EM | Admit: 2020-06-21 | Discharge: 2020-06-21 | Disposition: A | Discharge status: Home / Self Care | Payer: Medicare Other | Attending: Emergency Medicine | Admitting: Emergency Medicine

## 2020-06-21 DIAGNOSIS — H6592 Unspecified nonsuppurative otitis media, left ear: Secondary | ICD-10-CM | POA: Diagnosis not present

## 2020-06-21 DIAGNOSIS — J392 Other diseases of pharynx: Secondary | ICD-10-CM | POA: Diagnosis not present

## 2020-06-21 MED ORDER — PREDNISONE 10 MG PO TABS: 20.0000 mg | ORAL_TABLET | Freq: Every day | ORAL | 0 refills | Status: DC

## 2020-06-21 NOTE — Telephone Encounter (Signed)
Noted  

## 2020-06-21 NOTE — Telephone Encounter (Signed)
Patient left vm stating that her glands were swollen and that she had covid 2 weeks ago. She wanted to know if she could come in or go to UC. I went to call her and saw that she is at [facility].  CB# [phone]

## 2020-06-21 NOTE — ED Triage Notes (Addendum)
Pt woke up this morning and could barley talk, swollen neck glands and popping in ears.  Pt had covid x 2 weeks ago.

## 2020-06-21 NOTE — Discharge Instructions (Addendum)
Get plenty of rest and push fluids Prednisone was prescribed for inflammation Continue to use flonase for middle ear effusion Use medications daily for symptom relief Use OTC medications like ibuprofen or tylenol as needed fever or pain Call or go to the ED if you have any new or worsening symptoms such as fever, worsening cough, shortness of breath, chest tightness, chest pain, turning blue, changes in mental status, etc..Marland Kitchen

## 2020-06-21 NOTE — ED Provider Notes (Signed)
[place]   [id number] 06/21/20 Arrival Time: 0906   Chief Complaint  Patient presents with  . Lymphadenopathy     SUBJECTIVE: History from: patient.  Allison Freeman is a 74 y.o. female who presented to the urgent care with a complaint of swelling throat and neck and ear fullness for the past 1 day.  States she tested positive for COVID-19 on 06/08/2020.  Has received a monoclonal infusion.  Denies sick exposure to COVID, flu or strep.  Denies recent travel.  Has tried OTC medication without relief.  Denies aggravating factors.  Denies previous symptoms in the past.   Denies fever, chills, fatigue, sinus pain, rhinorrhea, sore throat, SOB, wheezing, chest pain, nausea, changes in bowel or bladder habits.      ROS: As per HPI.  All other pertinent ROS negative.     Past Medical History:  Diagnosis Date  . Acid reflux   . Arthritis   . Depression   . Family history of adverse reaction to anesthesia    Mother woke up and could hearduring surgery  . Hypercholesterolemia   . Hypertension   . Insomnia   . Osteoporosis   . Panic attacks    panic attacks  . Reflux   . Restless leg syndrome    Past Surgical History:  Procedure Laterality Date  . ABDOMINAL HYSTERECTOMY  2001  . BLADDER SURGERY    . BREAST BIOPSY  1987  . BREAST CYST EXCISION  1998  . CARPAL TUNNEL RELEASE Right 04/06/2015   Procedure: CARPAL TUNNEL RELEASE;  Surgeon: Carole Civil, MD;  Location: [facility];  Service: Orthopedics;  Laterality: Right;  . COLONOSCOPY  09/19/2004   PRF:[id number] hemorrhoids, otherwise normal rectum/Sigmoid diverticula.  Remainder of colon mucosa appeared normal  . COLONOSCOPY WITH ESOPHAGOGASTRODUODENOSCOPY (EGD) N/A 06/24/2013   LDJ:TTSVXBL polyp-removed. Small hiatal hernia. Abnormal gastric mucosa-query NSAID effect-s/p (chronic inactive gastritis, negative H. pylori)/TCS: Prominent internal hemorrhoids-likely source of hematochezia. Colonic diverticulosis. Colonic  polyp-removed (tubular adenoma)  . COLONOSCOPY WITH PROPOFOL N/A 11/01/2018   Procedure: COLONOSCOPY WITH PROPOFOL;  Surgeon: Daneil Dolin, MD;  Location: [facility];  Service: Endoscopy;  Laterality: N/A;  7:30am  . CYSTOCELE REPAIR  2010  . DILATION AND CURETTAGE OF UTERUS    . JOINT REPLACEMENT N/A    Phreesia 03/02/2020  . KNEE SURGERY Right 2003   arthroscopic left  . RECTAL SURGERY    . RECTOCELE REPAIR  2010  . REPLACEMENT TOTAL KNEE Right   . TONSILLECTOMY    . TOTAL KNEE ARTHROPLASTY Right 05/09/2019   Procedure: TOTAL KNEE ARTHROPLASTY;  Surgeon: Vickey Huger, MD;  Location: [facility];  Service: Orthopedics;  Laterality: Right;   Allergies  Allergen Reactions  . Other Other (See Comments)    Arthritis medications- causes a bacteria in the abdomen   Current Facility-Administered Medications on File Prior to Encounter  Medication Dose Route Frequency Provider Last Rate Last Admin  . denosumab (PROLIA) injection 60 mg  60 mg Subcutaneous Q6 months Susy Frizzle, MD   60 mg at 03/05/20 0840   Current Outpatient Medications on File Prior to Encounter  Medication Sig Dispense Refill  . acyclovir (ZOVIRAX) 400 MG tablet Take 400 mg by mouth 3 (three) times daily.    Marland Kitchen ALPRAZolam (XANAX) 0.5 MG tablet TAKE ONE TABLET BY MOUTH EVERY NIGHT AT BEDTIME 30 tablet 2  . Black Pepper-Turmeric (TURMERIC COMPLEX/BLACK PEPPER PO) Take 1 tablet by mouth daily.    . Calcium-Magnesium-Vitamin D (  CALCIUM 1200+D3 PO) Take 1 tablet by mouth daily.    . Carboxymethylcellul-Glycerin (LUBRICATING EYE DROPS OP) Place 1 drop into both eyes 2 (two) times daily.    . cyclobenzaprine (FLEXERIL) 10 MG tablet TAKE ONE TABLET (10MG  TOTAL) BY MOUTH THREE TIMES DAILY AS NEEDED FOR MUSCLE SPASMS 30 tablet 0  . denosumab (PROLIA) 60 MG/ML SOSY injection Inject 60 mg into the skin every 6 (six) months.    . fluticasone (FLONASE) 50 MCG/ACT nasal spray Use 2 sprays in each  nostril once daily (Patient taking  differently: Place 2 sprays into both nostrils at bedtime. ) 48 g 3  . levocetirizine (XYZAL) 5 MG tablet Take 5 mg by mouth daily.    Marland Kitchen losartan (COZAAR) 100 MG tablet TAKE ONE TABLET (100MG  TOTAL) BY MOUTH DAILY 90 tablet 3  . MELATONIN ER PO Take by mouth.    . Misc Natural Products (GLUCOSAMINE CHONDROITIN TRIPLE) TABS Take 1 tablet by mouth 2 (two) times daily.    . Multiple Vitamin (MULTIVITAMIN) tablet Take 1 tablet by mouth daily.    . Omega-3 Fatty Acids (FISH OIL) 1200 MG CAPS Take 1,200 mg by mouth 2 (two) times daily.     . pantoprazole (PROTONIX) 40 MG tablet TAKE ONE TABLET TWICE DAILY BEFORE MEALS 180 tablet 0  . pravastatin (PRAVACHOL) 40 MG tablet TAKE ONE (1) TABLET BY MOUTH EVERY DAY 90 tablet 1  . traMADol (ULTRAM) 50 MG tablet TAKE ONE TABLET BY MOUTH THREE TIMES DAILY AS NEEDED 270 tablet 2   Social History   Socioeconomic History  . Marital status: Married    Spouse name: Not on file  . Number of children: Not on file  . Years of education: Not on file  . Highest education level: Not on file  Occupational History  . Not on file  Tobacco Use  . Smoking status: Never Smoker  . Smokeless tobacco: Never Used  Vaping Use  . Vaping Use: Never used  Substance and Sexual Activity  . Alcohol use: No  . Drug use: No  . Sexual activity: Yes    Birth control/protection: Surgical  Other Topics Concern  . Not on file  Social History Narrative  . Not on file   Social Determinants of Health   Financial Resource Strain:   . Difficulty of Paying Living Expenses: Not on file  Food Insecurity:   . Worried About Charity fundraiser in the Last Year: Not on file  . Ran Out of Food in the Last Year: Not on file  Transportation Needs:   . Lack of Transportation (Medical): Not on file  . Lack of Transportation (Non-Medical): Not on file  Physical Activity:   . Days of Exercise per Week: Not on file  . Minutes of Exercise per Session: Not on file  Stress:   . Feeling of  Stress : Not on file  Social Connections:   . Frequency of Communication with Friends and Family: Not on file  . Frequency of Social Gatherings with Friends and Family: Not on file  . Attends Religious Services: Not on file  . Active Member of Clubs or Organizations: Not on file  . Attends Archivist Meetings: Not on file  . Marital Status: Not on file  Intimate Partner Violence:   . Fear of Current or Ex-Partner: Not on file  . Emotionally Abused: Not on file  . Physically Abused: Not on file  . Sexually Abused: Not on file   Family  History  Problem Relation Age of Onset  . Cancer Mother        Breast  . Hypertension Father   . Heart attack Father   . Cancer Sister        Breast  . Cancer Sister        breast  . Breast cancer Sister   . Colon cancer Neg Hx     OBJECTIVE:  Vitals:   06/21/20 0952 06/21/20 0953  BP: (!) 143/88   Pulse: 98   Resp: 19   Temp: 98.2 F (36.8 C)   TempSrc: Oral   SpO2: 98%   Weight:  165 lb (74.8 kg)  Height:  5\' 5"  (1.651 m)     Physical Exam Vitals and nursing note reviewed.  Constitutional:      General: She is not in acute distress.    Appearance: Normal appearance. She is normal weight. She is not ill-appearing, toxic-appearing or diaphoretic.  HENT:     Head: Normocephalic.     Right Ear: Ear canal and external ear normal. A middle ear effusion is present. A PE tube is present.     Left Ear: Tympanic membrane, ear canal and external ear normal.  Neck:     Thyroid: No thyromegaly or thyroid tenderness.  Cardiovascular:     Rate and Rhythm: Normal rate and regular rhythm.     Pulses: Normal pulses.     Heart sounds: Normal heart sounds. No murmur heard.  No friction rub. No gallop.   Pulmonary:     Effort: Pulmonary effort is normal. No respiratory distress.     Breath sounds: Normal breath sounds. No stridor. No wheezing, rhonchi or rales.  Chest:     Chest wall: No tenderness.  Musculoskeletal:     Cervical  back: Normal range of motion. No rigidity.  Lymphadenopathy:     Cervical: No cervical adenopathy.  Neurological:     Mental Status: She is alert and oriented to person, place, and time.     LABS:  No results found for this or any previous visit (from the past 24 hour(s)).   ASSESSMENT & PLAN:  1. Swelling of throat   2. Middle ear effusion, left     Meds ordered this encounter  Medications  . predniSONE (DELTASONE) 10 MG tablet    Sig: Take 2 tablets (20 mg total) by mouth daily.    Dispense:  15 tablet    Refill:  0   Discharge instructions  Get plenty of rest and push fluids Prednisone was prescribed for inflammation Continue to use flonase for middle ear effusion Use medications daily for symptom relief Use OTC medications like ibuprofen or tylenol as needed fever or pain Call or go to the ED if you have any new or worsening symptoms such as fever, worsening cough, shortness of breath, chest tightness, chest pain, turning blue, changes in mental status, etc...   Reviewed expectations re: course of current medical issues. Questions answered. Outlined signs and symptoms indicating need for more acute intervention. Patient verbalized understanding. After Visit Summary given.         Emerson Monte, FNP 06/21/20 1043

## 2020-06-28 DIAGNOSIS — R350 Frequency of micturition: Secondary | ICD-10-CM | POA: Diagnosis not present

## 2020-06-28 DIAGNOSIS — N3941 Urge incontinence: Secondary | ICD-10-CM | POA: Diagnosis not present

## 2020-07-03 ENCOUNTER — Other Ambulatory Visit: Payer: Self-pay

## 2020-07-03 ENCOUNTER — Ambulatory Visit (INDEPENDENT_AMBULATORY_CARE_PROVIDER_SITE_OTHER): Payer: Medicare Other | Admitting: Family Medicine

## 2020-07-03 VITALS — BP 150/84 | HR 97 | Temp 97.3°F | Ht 65.0 in | Wt 173.0 lb

## 2020-07-03 DIAGNOSIS — J0110 Acute frontal sinusitis, unspecified: Secondary | ICD-10-CM | POA: Diagnosis not present

## 2020-07-03 DIAGNOSIS — H6983 Other specified disorders of Eustachian tube, bilateral: Secondary | ICD-10-CM | POA: Diagnosis not present

## 2020-07-03 MED ORDER — AMOXICILLIN 875 MG PO TABS: 875.0000 mg | ORAL_TABLET | Freq: Two times a day (BID) | ORAL | 0 refills | Status: DC

## 2020-07-03 MED ORDER — PREDNISONE 20 MG PO TABS: ORAL_TABLET | ORAL | 0 refills | Status: DC

## 2020-07-03 NOTE — Progress Notes (Signed)
Subjective:    Patient ID: Allison Freeman, female    DOB: April 14, 1946, 74 y.o.   MRN: [id number]  HPI Patient had Covid for 5 weeks ago.  Although she is better ever since that time, she has had a popping sensation in both ears.  Whenever she turns her head she feels her ears pop like there is fluid trapped behind her eardrums.  She feels pressure like when you go up into an airplane or drive in the mountains.  She also reports a frontal sinus headache and postnasal drip causing a sore scratchy throat.  She recently went to an urgent care where she was given prednisone that temporarily help with the symptoms but they came back once she stopped the prednisone.  She is already on Flonase and Xyzal which does not seem to be helping. Past Medical History:  Diagnosis Date  . Acid reflux   . Arthritis   . Depression   . Family history of adverse reaction to anesthesia    Mother woke up and could hearduring surgery  . Hypercholesterolemia   . Hypertension   . Insomnia   . Osteoporosis   . Panic attacks    panic attacks  . Reflux   . Restless leg syndrome    Past Surgical History:  Procedure Laterality Date  . ABDOMINAL HYSTERECTOMY  2001  . BLADDER SURGERY    . BREAST BIOPSY  1987  . BREAST CYST EXCISION  1998  . CARPAL TUNNEL RELEASE Right 04/06/2015   Procedure: CARPAL TUNNEL RELEASE;  Surgeon: Carole Civil, MD;  Location: [facility];  Service: Orthopedics;  Laterality: Right;  . COLONOSCOPY  09/19/2004   BWI:[id number] hemorrhoids, otherwise normal rectum/Sigmoid diverticula.  Remainder of colon mucosa appeared normal  . COLONOSCOPY WITH ESOPHAGOGASTRODUODENOSCOPY (EGD) N/A 06/24/2013   BUL:AGTXMIW polyp-removed. Small hiatal hernia. Abnormal gastric mucosa-query NSAID effect-s/p (chronic inactive gastritis, negative H. pylori)/TCS: Prominent internal hemorrhoids-likely source of hematochezia. Colonic diverticulosis. Colonic polyp-removed (tubular adenoma)  . COLONOSCOPY WITH  PROPOFOL N/A 11/01/2018   Procedure: COLONOSCOPY WITH PROPOFOL;  Surgeon: Daneil Dolin, MD;  Location: [facility];  Service: Endoscopy;  Laterality: N/A;  7:30am  . CYSTOCELE REPAIR  2010  . DILATION AND CURETTAGE OF UTERUS    . JOINT REPLACEMENT N/A    Phreesia 03/02/2020  . KNEE SURGERY Right 2003   arthroscopic left  . RECTAL SURGERY    . RECTOCELE REPAIR  2010  . REPLACEMENT TOTAL KNEE Right   . TONSILLECTOMY    . TOTAL KNEE ARTHROPLASTY Right 05/09/2019   Procedure: TOTAL KNEE ARTHROPLASTY;  Surgeon: Vickey Huger, MD;  Location: [facility];  Service: Orthopedics;  Laterality: Right;   Current Outpatient Medications on File Prior to Visit  Medication Sig Dispense Refill  . acyclovir (ZOVIRAX) 400 MG tablet Take 400 mg by mouth 3 (three) times daily.    Marland Kitchen ALPRAZolam (XANAX) 0.5 MG tablet TAKE ONE TABLET BY MOUTH EVERY NIGHT AT BEDTIME 30 tablet 2  . Black Pepper-Turmeric (TURMERIC COMPLEX/BLACK PEPPER PO) Take 1 tablet by mouth daily.    . Calcium-Magnesium-Vitamin D (CALCIUM 1200+D3 PO) Take 1 tablet by mouth daily.    . Carboxymethylcellul-Glycerin (LUBRICATING EYE DROPS OP) Place 1 drop into both eyes 2 (two) times daily.    . cyclobenzaprine (FLEXERIL) 10 MG tablet TAKE ONE TABLET (10MG  TOTAL) BY MOUTH THREE TIMES DAILY AS NEEDED FOR MUSCLE SPASMS 30 tablet 0  . denosumab (PROLIA) 60 MG/ML SOSY injection Inject 60 mg into the skin every  6 (six) months.    . fluticasone (FLONASE) 50 MCG/ACT nasal spray Use 2 sprays in each  nostril once daily (Patient taking differently: Place 2 sprays into both nostrils at bedtime. ) 48 g 3  . levocetirizine (XYZAL) 5 MG tablet Take 5 mg by mouth daily.    Marland Kitchen losartan (COZAAR) 100 MG tablet TAKE ONE TABLET (100MG  TOTAL) BY MOUTH DAILY 90 tablet 3  . MELATONIN ER PO Take by mouth.    . Misc Natural Products (GLUCOSAMINE CHONDROITIN TRIPLE) TABS Take 1 tablet by mouth 2 (two) times daily.    . Multiple Vitamin (MULTIVITAMIN) tablet Take 1 tablet by  mouth daily.    . Omega-3 Fatty Acids (FISH OIL) 1200 MG CAPS Take 1,200 mg by mouth 2 (two) times daily.     . pantoprazole (PROTONIX) 40 MG tablet TAKE ONE TABLET TWICE DAILY BEFORE MEALS 180 tablet 0  . pravastatin (PRAVACHOL) 40 MG tablet TAKE ONE (1) TABLET BY MOUTH EVERY DAY 90 tablet 1  . predniSONE (DELTASONE) 10 MG tablet Take 2 tablets (20 mg total) by mouth daily. 15 tablet 0  . traMADol (ULTRAM) 50 MG tablet TAKE ONE TABLET BY MOUTH THREE TIMES DAILY AS NEEDED 270 tablet 2   Current Facility-Administered Medications on File Prior to Visit  Medication Dose Route Frequency Provider Last Rate Last Admin  . denosumab (PROLIA) injection 60 mg  60 mg Subcutaneous Q6 months Susy Frizzle, MD   60 mg at 03/05/20 0840   Allergies  Allergen Reactions  . Other Other (See Comments)    Arthritis medications- causes a bacteria in the abdomen   Social History   Socioeconomic History  . Marital status: Married    Spouse name: Not on file  . Number of children: Not on file  . Years of education: Not on file  . Highest education level: Not on file  Occupational History  . Not on file  Tobacco Use  . Smoking status: Never Smoker  . Smokeless tobacco: Never Used  Vaping Use  . Vaping Use: Never used  Substance and Sexual Activity  . Alcohol use: No  . Drug use: No  . Sexual activity: Yes    Birth control/protection: Surgical  Other Topics Concern  . Not on file  Social History Narrative  . Not on file   Social Determinants of Health   Financial Resource Strain:   . Difficulty of Paying Living Expenses: Not on file  Food Insecurity:   . Worried About Charity fundraiser in the Last Year: Not on file  . Ran Out of Food in the Last Year: Not on file  Transportation Needs:   . Lack of Transportation (Medical): Not on file  . Lack of Transportation (Non-Medical): Not on file  Physical Activity:   . Days of Exercise per Week: Not on file  . Minutes of Exercise per Session:  Not on file  Stress:   . Feeling of Stress : Not on file  Social Connections:   . Frequency of Communication with Friends and Family: Not on file  . Frequency of Social Gatherings with Friends and Family: Not on file  . Attends Religious Services: Not on file  . Active Member of Clubs or Organizations: Not on file  . Attends Archivist Meetings: Not on file  . Marital Status: Not on file  Intimate Partner Violence:   . Fear of Current or Ex-Partner: Not on file  . Emotionally Abused: Not on file  .  Physically Abused: Not on file  . Sexually Abused: Not on file      Review of Systems  All other systems reviewed and are negative.      Objective:   Physical Exam Vitals reviewed.  Constitutional:      Appearance: She is well-developed.  HENT:     Right Ear: Tympanic membrane and ear canal normal. No middle ear effusion. Tympanic membrane is not erythematous.     Left Ear: Tympanic membrane and ear canal normal.  No middle ear effusion. Tympanic membrane is not erythematous.     Nose: Congestion present.     Mouth/Throat:     Pharynx: No oropharyngeal exudate, posterior oropharyngeal erythema or uvula swelling.     Tonsils: No tonsillar abscesses.  Cardiovascular:     Rate and Rhythm: Normal rate and regular rhythm.     Heart sounds: Normal heart sounds. No murmur heard.   Pulmonary:     Effort: Pulmonary effort is normal. No respiratory distress.     Breath sounds: Normal breath sounds. No wheezing or rales.  Abdominal:     General: Bowel sounds are normal. There is no distension.     Palpations: Abdomen is soft.     Tenderness: There is no abdominal tenderness. There is no guarding or rebound.  Musculoskeletal:     Cervical back: Neck supple.  Lymphadenopathy:     Cervical: No cervical adenopathy.           Assessment & Plan:  Acute non-recurrent frontal sinusitis  Eustachian tube dysfunction, bilateral I believe the patient developed a sinus  infection after Covid and this is causing eustachian tube dysfunction and postnasal drip which is aggravating her throat.  She is already trying and failing Xyzal and Flonase.  Therefore I will try amoxicillin 875 mg twice daily for 10 days and a prednisone taper pack.  Reassess if no better in 1 week.

## 2020-07-05 ENCOUNTER — Other Ambulatory Visit: Payer: Self-pay | Admitting: Family Medicine

## 2020-07-09 DIAGNOSIS — M79672 Pain in left foot: Secondary | ICD-10-CM | POA: Diagnosis not present

## 2020-07-09 DIAGNOSIS — M85672 Other cyst of bone, left ankle and foot: Secondary | ICD-10-CM | POA: Diagnosis not present

## 2020-07-09 DIAGNOSIS — M1990 Unspecified osteoarthritis, unspecified site: Secondary | ICD-10-CM | POA: Diagnosis not present

## 2020-07-10 ENCOUNTER — Other Ambulatory Visit: Payer: Self-pay | Admitting: Family Medicine

## 2020-07-17 DIAGNOSIS — Z96651 Presence of right artificial knee joint: Secondary | ICD-10-CM | POA: Diagnosis not present

## 2020-07-26 DIAGNOSIS — N3941 Urge incontinence: Secondary | ICD-10-CM | POA: Diagnosis not present

## 2020-07-26 DIAGNOSIS — R350 Frequency of micturition: Secondary | ICD-10-CM | POA: Diagnosis not present

## 2020-08-07 ENCOUNTER — Ambulatory Visit (INDEPENDENT_AMBULATORY_CARE_PROVIDER_SITE_OTHER): Payer: Medicare Other

## 2020-08-07 ENCOUNTER — Other Ambulatory Visit: Payer: Self-pay

## 2020-08-07 ENCOUNTER — Other Ambulatory Visit: Payer: Self-pay | Admitting: Family Medicine

## 2020-08-07 DIAGNOSIS — Z23 Encounter for immunization: Secondary | ICD-10-CM | POA: Diagnosis not present

## 2020-08-07 NOTE — Telephone Encounter (Signed)
Ok to refill 

## 2020-08-22 ENCOUNTER — Other Ambulatory Visit: Payer: Self-pay | Admitting: Family Medicine

## 2020-08-22 DIAGNOSIS — I10 Essential (primary) hypertension: Secondary | ICD-10-CM

## 2020-08-23 DIAGNOSIS — N3941 Urge incontinence: Secondary | ICD-10-CM | POA: Diagnosis not present

## 2020-08-27 ENCOUNTER — Other Ambulatory Visit: Payer: Self-pay | Admitting: Family Medicine

## 2020-08-27 NOTE — Telephone Encounter (Signed)
Ok to refill??  Last office visit 07/03/2020.  Last refill 05/24/2020, #2 refills.

## 2020-09-06 ENCOUNTER — Ambulatory Visit (INDEPENDENT_AMBULATORY_CARE_PROVIDER_SITE_OTHER): Payer: Medicare Other | Admitting: Family Medicine

## 2020-09-06 DIAGNOSIS — M818 Other osteoporosis without current pathological fracture: Secondary | ICD-10-CM | POA: Diagnosis not present

## 2020-09-10 ENCOUNTER — Other Ambulatory Visit: Payer: Self-pay | Admitting: Family Medicine

## 2020-09-20 DIAGNOSIS — R350 Frequency of micturition: Secondary | ICD-10-CM | POA: Diagnosis not present

## 2020-10-10 ENCOUNTER — Other Ambulatory Visit: Payer: Self-pay | Admitting: Family Medicine

## 2020-10-10 NOTE — Telephone Encounter (Signed)
Ok to refill 

## 2020-10-18 DIAGNOSIS — N3941 Urge incontinence: Secondary | ICD-10-CM | POA: Diagnosis not present

## 2020-10-18 DIAGNOSIS — R350 Frequency of micturition: Secondary | ICD-10-CM | POA: Diagnosis not present

## 2020-11-08 DIAGNOSIS — H903 Sensorineural hearing loss, bilateral: Secondary | ICD-10-CM | POA: Diagnosis not present

## 2020-11-08 DIAGNOSIS — H9313 Tinnitus, bilateral: Secondary | ICD-10-CM | POA: Diagnosis not present

## 2020-11-08 DIAGNOSIS — H7201 Central perforation of tympanic membrane, right ear: Secondary | ICD-10-CM | POA: Diagnosis not present

## 2020-11-08 DIAGNOSIS — H6983 Other specified disorders of Eustachian tube, bilateral: Secondary | ICD-10-CM | POA: Diagnosis not present

## 2020-11-08 DIAGNOSIS — H838X3 Other specified diseases of inner ear, bilateral: Secondary | ICD-10-CM | POA: Diagnosis not present

## 2020-11-12 ENCOUNTER — Other Ambulatory Visit: Payer: Self-pay | Admitting: Family Medicine

## 2020-11-12 NOTE — Telephone Encounter (Signed)
Ok to refill 

## 2020-11-22 DIAGNOSIS — N3941 Urge incontinence: Secondary | ICD-10-CM | POA: Diagnosis not present

## 2020-11-22 DIAGNOSIS — R350 Frequency of micturition: Secondary | ICD-10-CM | POA: Diagnosis not present

## 2020-11-26 ENCOUNTER — Other Ambulatory Visit: Payer: Self-pay | Admitting: Family Medicine

## 2020-12-10 ENCOUNTER — Other Ambulatory Visit: Payer: Self-pay | Admitting: Family Medicine

## 2020-12-14 ENCOUNTER — Encounter: Payer: Self-pay | Admitting: Family Medicine

## 2020-12-20 DIAGNOSIS — N3941 Urge incontinence: Secondary | ICD-10-CM | POA: Diagnosis not present

## 2020-12-20 DIAGNOSIS — R350 Frequency of micturition: Secondary | ICD-10-CM | POA: Diagnosis not present

## 2020-12-31 ENCOUNTER — Other Ambulatory Visit: Payer: Self-pay | Admitting: Family Medicine

## 2020-12-31 ENCOUNTER — Other Ambulatory Visit: Payer: Self-pay | Admitting: *Deleted

## 2020-12-31 DIAGNOSIS — M818 Other osteoporosis without current pathological fracture: Secondary | ICD-10-CM

## 2020-12-31 NOTE — Telephone Encounter (Signed)
Ok to refill??  Last office visit 09/06/2020.  Last refill 11/26/2020.  Ok to add refills to prescription?

## 2021-01-08 ENCOUNTER — Other Ambulatory Visit: Payer: Self-pay | Admitting: Family Medicine

## 2021-01-25 DIAGNOSIS — M1990 Unspecified osteoarthritis, unspecified site: Secondary | ICD-10-CM | POA: Diagnosis not present

## 2021-01-25 DIAGNOSIS — M79671 Pain in right foot: Secondary | ICD-10-CM | POA: Diagnosis not present

## 2021-01-25 DIAGNOSIS — M79672 Pain in left foot: Secondary | ICD-10-CM | POA: Diagnosis not present

## 2021-01-30 DIAGNOSIS — Z1283 Encounter for screening for malignant neoplasm of skin: Secondary | ICD-10-CM | POA: Diagnosis not present

## 2021-01-30 DIAGNOSIS — L814 Other melanin hyperpigmentation: Secondary | ICD-10-CM | POA: Diagnosis not present

## 2021-01-30 DIAGNOSIS — D1801 Hemangioma of skin and subcutaneous tissue: Secondary | ICD-10-CM | POA: Diagnosis not present

## 2021-01-31 DIAGNOSIS — M1712 Unilateral primary osteoarthritis, left knee: Secondary | ICD-10-CM | POA: Diagnosis not present

## 2021-01-31 DIAGNOSIS — M25562 Pain in left knee: Secondary | ICD-10-CM | POA: Diagnosis not present

## 2021-01-31 DIAGNOSIS — G8929 Other chronic pain: Secondary | ICD-10-CM | POA: Diagnosis not present

## 2021-01-31 DIAGNOSIS — Z96651 Presence of right artificial knee joint: Secondary | ICD-10-CM | POA: Diagnosis not present

## 2021-02-04 ENCOUNTER — Encounter: Payer: Self-pay | Admitting: *Deleted

## 2021-02-11 ENCOUNTER — Other Ambulatory Visit: Payer: Self-pay | Admitting: Family Medicine

## 2021-02-26 ENCOUNTER — Other Ambulatory Visit: Payer: Self-pay | Admitting: Family Medicine

## 2021-03-07 ENCOUNTER — Encounter: Payer: Self-pay | Admitting: Family Medicine

## 2021-03-07 ENCOUNTER — Ambulatory Visit (INDEPENDENT_AMBULATORY_CARE_PROVIDER_SITE_OTHER): Payer: Medicare Other | Admitting: Family Medicine

## 2021-03-07 ENCOUNTER — Other Ambulatory Visit: Payer: Self-pay

## 2021-03-07 VITALS — BP 120/64 | HR 88 | Temp 98.0°F | Resp 14 | Ht 65.0 in | Wt 173.0 lb

## 2021-03-07 DIAGNOSIS — E7800 Pure hypercholesterolemia, unspecified: Secondary | ICD-10-CM | POA: Diagnosis not present

## 2021-03-07 DIAGNOSIS — Z0001 Encounter for general adult medical examination with abnormal findings: Secondary | ICD-10-CM

## 2021-03-07 DIAGNOSIS — M810 Age-related osteoporosis without current pathological fracture: Secondary | ICD-10-CM

## 2021-03-07 DIAGNOSIS — Z1231 Encounter for screening mammogram for malignant neoplasm of breast: Secondary | ICD-10-CM

## 2021-03-07 DIAGNOSIS — Z96659 Presence of unspecified artificial knee joint: Secondary | ICD-10-CM

## 2021-03-07 DIAGNOSIS — Z0000 Encounter for general adult medical examination without abnormal findings: Secondary | ICD-10-CM

## 2021-03-07 DIAGNOSIS — I10 Essential (primary) hypertension: Secondary | ICD-10-CM

## 2021-03-07 DIAGNOSIS — M545 Low back pain, unspecified: Secondary | ICD-10-CM | POA: Diagnosis not present

## 2021-03-07 LAB — LIPID PANEL
Cholesterol: 167 mg/dL
HDL: 59 mg/dL
LDL Cholesterol (Calc): 89 mg/dL
Non-HDL Cholesterol (Calc): 108 mg/dL
Total CHOL/HDL Ratio: 2.8 (calc)
Triglycerides: 96 mg/dL

## 2021-03-07 LAB — CBC WITH DIFFERENTIAL/PLATELET
Absolute Monocytes: 456 {cells}/uL (ref 200–950)
Basophils Absolute: 53 {cells}/uL (ref 0–200)
Basophils Relative: 1 %
Eosinophils Absolute: 191 {cells}/uL (ref 15–500)
Eosinophils Relative: 3.6 %
HCT: 40.2 % (ref 35.0–45.0)
Hemoglobin: 13.1 g/dL (ref 11.7–15.5)
Lymphs Abs: 1664 {cells}/uL (ref 850–3900)
MCH: 30 pg (ref 27.0–33.0)
MCHC: 32.6 g/dL (ref 32.0–36.0)
MCV: 92.2 fL (ref 80.0–100.0)
MPV: 11.2 fL (ref 7.5–12.5)
Monocytes Relative: 8.6 %
Neutro Abs: 2936 {cells}/uL (ref 1500–7800)
Neutrophils Relative %: 55.4 %
Platelets: 263 Thousand/uL (ref 140–400)
RBC: 4.36 Million/uL (ref 3.80–5.10)
RDW: 12.6 % (ref 11.0–15.0)
Total Lymphocyte: 31.4 %
WBC: 5.3 Thousand/uL (ref 3.8–10.8)

## 2021-03-07 LAB — COMPLETE METABOLIC PANEL WITH GFR
AG Ratio: 1.4 (calc) (ref 1.0–2.5)
ALT: 15 U/L (ref 6–29)
AST: 19 U/L (ref 10–35)
Albumin: 3.9 g/dL (ref 3.6–5.1)
Alkaline phosphatase (APISO): 51 U/L (ref 37–153)
BUN: 19 mg/dL (ref 7–25)
CO2: 30 mmol/L (ref 20–32)
Calcium: 10.2 mg/dL (ref 8.6–10.4)
Chloride: 105 mmol/L (ref 98–110)
Creat: 0.89 mg/dL (ref 0.60–0.93)
GFR, Est African American: 75 mL/min/{1.73_m2} (ref >=60)
GFR, Est Non African American: 65 mL/min/{1.73_m2} (ref >=60)
Globulin: 2.7 g/dL (calc) (ref 1.9–3.7)
Glucose, Bld: 87 mg/dL (ref 65–99)
Potassium: 4.4 mmol/L (ref 3.5–5.3)
Sodium: 141 mmol/L (ref 135–146)
Total Bilirubin: 1.3 mg/dL — ABNORMAL HIGH (ref 0.2–1.2)
Total Protein: 6.6 g/dL (ref 6.1–8.1)

## 2021-03-07 MED ORDER — TRAMADOL HCL 50 MG PO TABS
50.0000 mg | ORAL_TABLET | Freq: Three times a day (TID) | ORAL | 2 refills | Status: DC | PRN
Start: 2021-03-07 — End: 2021-12-03

## 2021-03-07 NOTE — Progress Notes (Signed)
Subjective:    Patient ID: Allison Freeman, female    DOB: 08-01-1946, 75 y.o.   MRN: [id number]  HPI  Patient is here today for a complete physical exam.  Last colonoscopy was performed in February 2020 and was clear.  No repeat colonoscopy was recommended.  Last mammogram was in August 2021 and is not due again until August of this year.  She did have a bone density test which showed osteoporosis in 2020.  She is due for her Prolia injection today.  At that time, T score was -2.5 in the lumbar spine.  She is due to repeat that at her earliest convenience.  Due to age she does not require Pap smear.  Patient also has a history of a hysterectomy.  Otherwise she is doing well with no concerns.  She is due for a booster on her COVID shot.  She had COVID in November and she is hesitant to receive her booster.  Her risk factors for complicated COVID would include age greater than 73 so I did encourage the booster. Immunization History  Administered Date(s) Administered   Fluad Quad(high Dose 65+) 08/07/2020   Influenza,inj,Quad PF,6+ Mos 06/23/2014, 06/27/2015, 06/27/2016   Influenza-Unspecified 06/30/2013, 07/14/2017, 07/06/2018   PFIZER(Purple Top)SARS-COV-2 Vaccination 10/13/2019, 11/03/2019   Pneumococcal Conjugate-13 06/23/2014   Pneumococcal Polysaccharide-23 01/28/2016   Zoster Recombinat (Shingrix) 11/27/2017, 03/01/2018   Past Medical History:  Diagnosis Date   Acid reflux    Arthritis    Depression    Family history of adverse reaction to anesthesia    Mother woke up and could hearduring surgery   Hypercholesterolemia    Hypertension    Insomnia    Osteoporosis    Panic attacks    panic attacks   Reflux    Restless leg syndrome    Past Surgical History:  Procedure Laterality Date   ABDOMINAL HYSTERECTOMY  2001   BLADDER SURGERY     BREAST BIOPSY  1987   BREAST CYST EXCISION  1998   CARPAL TUNNEL RELEASE Right 04/06/2015   Procedure: CARPAL TUNNEL RELEASE;  Surgeon:  Carole Civil, MD;  Location: [facility];  Service: Orthopedics;  Laterality: Right;   COLONOSCOPY  09/19/2004   [id number]:[id number] hemorrhoids, otherwise normal rectum/Sigmoid diverticula.  Remainder of colon mucosa appeared normal   COLONOSCOPY WITH ESOPHAGOGASTRODUODENOSCOPY (EGD) N/A 06/24/2013   IWL:[id number] polyp-removed. Small hiatal hernia. Abnormal gastric mucosa-query NSAID effect-s/p (chronic inactive gastritis, negative H. pylori)/TCS: Prominent internal hemorrhoids-likely source of hematochezia. Colonic diverticulosis. Colonic polyp-removed (tubular adenoma)   COLONOSCOPY WITH PROPOFOL N/A 11/01/2018   Procedure: COLONOSCOPY WITH PROPOFOL;  Surgeon: Daneil Dolin, MD;  Location: [facility];  Service: Endoscopy;  Laterality: N/A;  7:30am   CYSTOCELE REPAIR  2010   DILATION AND CURETTAGE OF UTERUS     JOINT REPLACEMENT N/A    [employer] 03/02/2020   KNEE SURGERY Right 2003   arthroscopic left   RECTAL SURGERY     RECTOCELE REPAIR  2010   REPLACEMENT TOTAL KNEE Right    TONSILLECTOMY     TOTAL KNEE ARTHROPLASTY Right 05/09/2019   Procedure: TOTAL KNEE ARTHROPLASTY;  Surgeon: Vickey Huger, MD;  Location: [facility];  Service: Orthopedics;  Laterality: Right;   Current Outpatient Medications on File Prior to Visit  Medication Sig Dispense Refill   acyclovir (ZOVIRAX) 400 MG tablet Take 400 mg by mouth 3 (three) times daily.     ALPRAZolam (XANAX) 0.5 MG tablet TAKE ONE TABLET BY MOUTH EVERY NIGHT AT  BEDTIME 30 tablet 3   Black Pepper-Turmeric (TURMERIC COMPLEX/BLACK PEPPER PO) Take 1 tablet by mouth daily.     Calcium-Magnesium-Vitamin D (CALCIUM 1200+D3 PO) Take 1 tablet by mouth daily.     Carboxymethylcellul-Glycerin (LUBRICATING EYE DROPS OP) Place 1 drop into both eyes 2 (two) times daily.     cyclobenzaprine (FLEXERIL) 10 MG tablet TAKE ONE TABLET (10MG  TOTAL) BY MOUTH THREE TIMES DAILY AS NEEDED FOR MUSCLE SPASMS 30 tablet 0   denosumab (PROLIA) 60 MG/ML SOSY injection Inject 60 mg  into the skin every 6 (six) months.     fluticasone (FLONASE) 50 MCG/ACT nasal spray Use 2 sprays in each  nostril once daily (Patient taking differently: Place 2 sprays into both nostrils at bedtime.) 48 g 3   levocetirizine (XYZAL) 5 MG tablet Take 5 mg by mouth daily.     losartan (COZAAR) 100 MG tablet TAKE ONE TABLET (100MG  TOTAL) BY MOUTH DAILY 90 tablet 3   MELATONIN ER PO Take by mouth.     Misc Natural Products (GLUCOSAMINE CHONDROITIN TRIPLE) TABS Take 1 tablet by mouth 2 (two) times daily.     Multiple Vitamin (MULTIVITAMIN) tablet Take 1 tablet by mouth daily.     Omega-3 Fatty Acids (FISH OIL) 1200 MG CAPS Take 1,200 mg by mouth 2 (two) times daily.      pantoprazole (PROTONIX) 40 MG tablet TAKE ONE TABLET TWICE DAILY BEFORE MEALS 180 tablet 0   pravastatin (PRAVACHOL) 40 MG tablet TAKE ONE (1) TABLET BY MOUTH EVERY DAY 90 tablet 1   rOPINIRole (REQUIP) 2 MG tablet TAKE ONE TABLET BY MOUTH EVERY NIGHT AT BEDTIME 90 tablet 1   traMADol (ULTRAM) 50 MG tablet TAKE ONE TABLET BY MOUTH THREE TIMES DAILY AS NEEDED 270 tablet 2   Current Facility-Administered Medications on File Prior to Visit  Medication Dose Route Frequency Provider Last Rate Last Admin   denosumab (PROLIA) injection 60 mg  60 mg Subcutaneous Q6 months Susy Frizzle, MD   60 mg at 09/06/20 1610   Allergies  Allergen Reactions   Other Other (See Comments)    Arthritis medications- causes a bacteria in the abdomen   Social History   Socioeconomic History   Marital status: Married    Spouse name: Not on file   Number of children: Not on file   Years of education: Not on file   Highest education level: Not on file  Occupational History   Not on file  Tobacco Use   Smoking status: Never   Smokeless tobacco: Never  Vaping Use   Vaping Use: Never used  Substance and Sexual Activity   Alcohol use: No   Drug use: No   Sexual activity: Yes    Birth control/protection: Surgical  Other Topics Concern   Not  on file  Social History Narrative   Not on file   Social Determinants of Health   Financial Resource Strain: Not on file  Food Insecurity: Not on file  Transportation Needs: Not on file  Physical Activity: Not on file  Stress: Not on file  Social Connections: Not on file  Intimate Partner Violence: Not on file   Colonoscopy was in 2014 and is up-to-date.  Mammogram was checked in August and is up-to-date.  Patient has a history of a hysterectomy and therefore does not require a Pap smear.  Review of Systems     Objective:   Physical Exam Vitals reviewed.  Constitutional:      General: She is  not in acute distress.    Appearance: Normal appearance. She is normal weight. She is not ill-appearing, toxic-appearing or diaphoretic.  HENT:     Right Ear: Tympanic membrane and ear canal normal.     Left Ear: Tympanic membrane and ear canal normal.     Nose: Nose normal. No congestion or rhinorrhea.     Mouth/Throat:     Mouth: Mucous membranes are moist.     Pharynx: Oropharynx is clear. No oropharyngeal exudate or posterior oropharyngeal erythema.  Eyes:     General:        Right eye: No discharge.        Left eye: No discharge.     Extraocular Movements: Extraocular movements intact.     Conjunctiva/sclera: Conjunctivae normal.     Pupils: Pupils are equal, round, and reactive to light.  Neck:     Vascular: No carotid bruit.  Cardiovascular:     Rate and Rhythm: Normal rate and regular rhythm.     Pulses: Normal pulses.     Heart sounds: Normal heart sounds. No murmur heard.   No friction rub. No gallop.  Pulmonary:     Effort: Pulmonary effort is normal. No respiratory distress.     Breath sounds: Normal breath sounds. No stridor. No wheezing, rhonchi or rales.  Abdominal:     General: Abdomen is flat. Bowel sounds are normal. There is no distension.     Palpations: Abdomen is soft. There is no mass.     Tenderness: There is no abdominal tenderness. There is no guarding  or rebound.     Hernia: No hernia is present.  Musculoskeletal:     Cervical back: Normal range of motion and neck supple.     Right lower leg: No edema.     Left lower leg: No edema.  Lymphadenopathy:     Cervical: No cervical adenopathy.  Skin:    Coloration: Skin is not jaundiced or pale.     Findings: No bruising, erythema, lesion or rash.  Neurological:     General: No focal deficit present.     Mental Status: She is alert and oriented to person, place, and time. Mental status is at baseline.     Cranial Nerves: No cranial nerve deficit.     Sensory: No sensory deficit.     Motor: No weakness.     Coordination: Coordination normal.     Gait: Gait normal.     Deep Tendon Reflexes: Reflexes normal.  Psychiatric:        Mood and Affect: Mood normal.        Behavior: Behavior normal.        Thought Content: Thought content normal.        Judgment: Judgment normal.          Assessment & Plan:  Encounter for screening mammogram for malignant neoplasm of breast - Plan: MM Digital Screening  Osteoporosis, unspecified osteoporosis type, unspecified pathological fracture presence - Plan: DG Bone Density  General medical exam  Benign essential HTN - Plan: CBC with Differential/Platelet, COMPLETE METABOLIC PANEL WITH GFR, Lipid panel  Pure hypercholesterolemia - Plan: CBC with Differential/Platelet, COMPLETE METABOLIC PANEL WITH GFR, Lipid panel  Status post total knee replacement, unspecified laterality  Right low back pain, unspecified chronicity, unspecified whether sciatica present Physical exam today is completely normal.  I will schedule the patient for her mammogram in August and I will also repeat a bone density test.  She received her Prolia shot  today.  We will check a CBC, CMP, lipid panel.  Pap smear is not necessary.  Colonoscopy is up-to-date.  She denies any falls, depression, memory loss.  I will refill her tramadol that she takes for chronic low back pain and  osteoarthritis in her knees.

## 2021-03-14 ENCOUNTER — Encounter: Payer: Self-pay | Admitting: Family Medicine

## 2021-03-22 ENCOUNTER — Other Ambulatory Visit: Payer: Self-pay | Admitting: Family Medicine

## 2021-03-28 ENCOUNTER — Other Ambulatory Visit (HOSPITAL_COMMUNITY): Payer: Self-pay | Admitting: Family Medicine

## 2021-03-28 DIAGNOSIS — Z1231 Encounter for screening mammogram for malignant neoplasm of breast: Secondary | ICD-10-CM

## 2021-04-02 ENCOUNTER — Telehealth: Payer: Self-pay | Admitting: Family Medicine

## 2021-04-02 DIAGNOSIS — M1712 Unilateral primary osteoarthritis, left knee: Secondary | ICD-10-CM | POA: Diagnosis not present

## 2021-04-02 NOTE — Telephone Encounter (Signed)
Patient dropped off paperwork requiring provider's signature to approve surgery on left knee. Surgeon needs medical records and signature of provider. Form faxed to medical records. Confirmation received with a time stamp of [phone]. Paperwork placed in provider's green folder in his office.   Please advise at [phone].

## 2021-04-03 NOTE — Telephone Encounter (Signed)
Forwarded to provider.

## 2021-04-10 ENCOUNTER — Encounter: Payer: Self-pay | Admitting: Emergency Medicine

## 2021-04-10 ENCOUNTER — Ambulatory Visit
Admission: EM | Admit: 2021-04-10 | Discharge: 2021-04-10 | Disposition: A | Discharge status: Home / Self Care | Payer: Medicare Other | Attending: Family Medicine | Admitting: Family Medicine

## 2021-04-10 DIAGNOSIS — L299 Pruritus, unspecified: Secondary | ICD-10-CM

## 2021-04-10 DIAGNOSIS — B880 Other acariasis: Secondary | ICD-10-CM

## 2021-04-10 MED ORDER — PREDNISONE 20 MG PO TABS: 40.0000 mg | ORAL_TABLET | Freq: Every day | ORAL | 0 refills | Status: DC

## 2021-04-10 NOTE — ED Triage Notes (Signed)
Rash on arms and truck area since yesterday.  States rash itches.

## 2021-04-10 NOTE — ED Provider Notes (Signed)
Allison Freeman   [id number] 04/10/21 Arrival Time: Allison Freeman:  1. Itching   2. Chigger bites    No signs of skin infection. Begin: Meds ordered this encounter  Medications   predniSONE (DELTASONE) 20 MG tablet    Sig: Take 2 tablets (40 mg total) by mouth daily.    Dispense:  10 tablet    Refill:  0    Follow-up Information     Susy Frizzle, MD.   Specialty: Family Medicine Why: As needed. Contact information: [address] [place] [address] [place] [address] [phone]         [facility] [place].   Specialty: Urgent Care Why: If symptoms worsen in any way. Contact information: [address] [place] [address] [phone]                 Will follow up with PCP or here if worsening or failing to improve as anticipated. Reviewed expectations re: course of current medical issues. Questions answered. Outlined signs and symptoms indicating need for more acute intervention. Patient verbalized understanding. After Visit Summary given.   SUBJECTIVE:  Allison Freeman is a 75 y.o. female who presents with a skin complaint. "Itchy rash" over trunk and arms; noted yesterday; has been working in yard this week; questions relation. Afebrile. No tx PTA.   OBJECTIVE: Vitals:   04/10/21 1635  BP: (!) 155/87  Pulse: 93  Resp: 16  Temp: 97.9 F (36.6 C)  TempSrc: Oral  SpO2: 96%    General appearance: alert; no distress HEENT: [place]; AT Extremities: no edema; moves all extremities normally Skin: warm and dry; scattered raised erythematous areas measuring approx 0.5 cm each over trunk and arms Psychological: alert and cooperative; normal mood and affect  Allergies  Allergen Reactions   Other Other (See Comments)    Arthritis medications- causes a bacteria in the abdomen    Past Medical History:  Diagnosis Date   Acid reflux    Arthritis    Depression    Family history of  adverse reaction to anesthesia    Mother woke up and could hearduring surgery   Hypercholesterolemia    Hypertension    Insomnia    Osteoporosis    Panic attacks    panic attacks   Reflux    Restless leg syndrome    Social History   Socioeconomic History   Marital status: Married    Spouse name: Not on file   Number of children: Not on file   Years of education: Not on file   Highest education level: Not on file  Occupational History   Not on file  Tobacco Use   Smoking status: Never   Smokeless tobacco: Never  Vaping Use   Vaping Use: Never used  Substance and Sexual Activity   Alcohol use: No   Drug use: No   Sexual activity: Yes    Birth control/protection: Surgical  Other Topics Concern   Not on file  Social History Narrative   Not on file   Social Determinants of Health   Financial Resource Strain: Not on file  Food Insecurity: Not on file  Transportation Needs: Not on file  Physical Activity: Not on file  Stress: Not on file  Social Connections: Not on file  Intimate Partner Violence: Not on file   Family History  Problem Relation Age of Onset   Cancer Mother  Breast   Hypertension Father    Heart attack Father    Cancer Sister        Breast   Cancer Sister        breast   Breast cancer Sister    Colon cancer Neg Hx    Past Surgical History:  Procedure Laterality Date   ABDOMINAL HYSTERECTOMY  2001   BLADDER SURGERY     BREAST BIOPSY  1987   BREAST CYST EXCISION  1998   CARPAL TUNNEL RELEASE Right 04/06/2015   Procedure: CARPAL TUNNEL RELEASE;  Surgeon: Carole Civil, MD;  Location: [facility];  Service: Orthopedics;  Laterality: Right;   COLONOSCOPY  09/19/2004   ZOX:[id number] hemorrhoids, otherwise normal rectum/Sigmoid diverticula.  Remainder of colon mucosa appeared normal   COLONOSCOPY WITH ESOPHAGOGASTRODUODENOSCOPY (EGD) N/A 06/24/2013   JWJ:[id number] polyp-removed. Small hiatal hernia. Abnormal gastric mucosa-query NSAID effect-s/p  (chronic inactive gastritis, negative H. pylori)/TCS: Prominent internal hemorrhoids-likely source of hematochezia. Colonic diverticulosis. Colonic polyp-removed (tubular adenoma)   COLONOSCOPY WITH PROPOFOL N/A 11/01/2018   Procedure: COLONOSCOPY WITH PROPOFOL;  Surgeon: Daneil Dolin, MD;  Location: [facility];  Service: Endoscopy;  Laterality: N/A;  7:30am   CYSTOCELE REPAIR  2010   DILATION AND CURETTAGE OF UTERUS     JOINT REPLACEMENT N/A    [employer] 03/02/2020   KNEE SURGERY Right 2003   arthroscopic left   RECTAL SURGERY     RECTOCELE REPAIR  2010   REPLACEMENT TOTAL KNEE Right    TONSILLECTOMY     TOTAL KNEE ARTHROPLASTY Right 05/09/2019   Procedure: TOTAL KNEE ARTHROPLASTY;  Surgeon: Vickey Huger, MD;  Location: [facility];  Service: Orthopedics;  Laterality: Right;      Vanessa Kick, MD 04/10/21 [phone]

## 2021-04-16 ENCOUNTER — Other Ambulatory Visit: Payer: Self-pay | Admitting: Family Medicine

## 2021-04-16 DIAGNOSIS — I10 Essential (primary) hypertension: Secondary | ICD-10-CM | POA: Diagnosis not present

## 2021-04-16 DIAGNOSIS — M1712 Unilateral primary osteoarthritis, left knee: Secondary | ICD-10-CM | POA: Diagnosis not present

## 2021-04-17 ENCOUNTER — Other Ambulatory Visit: Payer: Self-pay

## 2021-04-17 ENCOUNTER — Encounter: Payer: Self-pay | Admitting: Family Medicine

## 2021-04-17 MED ORDER — CYCLOBENZAPRINE HCL 10 MG PO TABS: ORAL_TABLET | ORAL | 0 refills | Status: DC

## 2021-05-01 ENCOUNTER — Ambulatory Visit (HOSPITAL_COMMUNITY): Payer: Medicare Other

## 2021-05-01 ENCOUNTER — Other Ambulatory Visit (HOSPITAL_COMMUNITY): Payer: Medicare Other

## 2021-05-03 ENCOUNTER — Other Ambulatory Visit: Payer: Self-pay | Admitting: Family Medicine

## 2021-05-06 ENCOUNTER — Other Ambulatory Visit: Payer: Self-pay

## 2021-05-06 ENCOUNTER — Ambulatory Visit (HOSPITAL_COMMUNITY)
Admission: RE | Admit: 2021-05-06 | Discharge: 2021-05-06 | Disposition: A | Discharge status: Home / Self Care | Payer: Medicare Other | Source: Ambulatory Visit | Attending: Family Medicine | Admitting: Family Medicine

## 2021-05-06 DIAGNOSIS — Z13820 Encounter for screening for osteoporosis: Secondary | ICD-10-CM | POA: Diagnosis not present

## 2021-05-06 DIAGNOSIS — M810 Age-related osteoporosis without current pathological fracture: Secondary | ICD-10-CM

## 2021-05-06 DIAGNOSIS — Z1231 Encounter for screening mammogram for malignant neoplasm of breast: Secondary | ICD-10-CM | POA: Insufficient documentation

## 2021-05-06 DIAGNOSIS — M85831 Other specified disorders of bone density and structure, right forearm: Secondary | ICD-10-CM | POA: Diagnosis not present

## 2021-05-06 DIAGNOSIS — Z780 Asymptomatic menopausal state: Secondary | ICD-10-CM | POA: Diagnosis not present

## 2021-05-07 DIAGNOSIS — H353111 Nonexudative age-related macular degeneration, right eye, early dry stage: Secondary | ICD-10-CM | POA: Diagnosis not present

## 2021-05-13 DIAGNOSIS — G8918 Other acute postprocedural pain: Secondary | ICD-10-CM | POA: Diagnosis not present

## 2021-05-13 DIAGNOSIS — M1712 Unilateral primary osteoarthritis, left knee: Secondary | ICD-10-CM | POA: Diagnosis not present

## 2021-05-17 ENCOUNTER — Other Ambulatory Visit: Payer: Self-pay | Admitting: Family Medicine

## 2021-05-17 NOTE — Telephone Encounter (Signed)
Ok to refill 

## 2021-05-20 DIAGNOSIS — M25562 Pain in left knee: Secondary | ICD-10-CM | POA: Diagnosis not present

## 2021-05-20 DIAGNOSIS — Z7409 Other reduced mobility: Secondary | ICD-10-CM | POA: Diagnosis not present

## 2021-05-20 DIAGNOSIS — M25662 Stiffness of left knee, not elsewhere classified: Secondary | ICD-10-CM | POA: Diagnosis not present

## 2021-05-20 DIAGNOSIS — M25462 Effusion, left knee: Secondary | ICD-10-CM | POA: Diagnosis not present

## 2021-05-20 DIAGNOSIS — Z96652 Presence of left artificial knee joint: Secondary | ICD-10-CM | POA: Diagnosis not present

## 2021-05-20 DIAGNOSIS — R29898 Other symptoms and signs involving the musculoskeletal system: Secondary | ICD-10-CM | POA: Diagnosis not present

## 2021-05-23 ENCOUNTER — Ambulatory Visit: Payer: Medicare Other | Admitting: Urology

## 2021-05-23 DIAGNOSIS — M25562 Pain in left knee: Secondary | ICD-10-CM | POA: Diagnosis not present

## 2021-05-23 DIAGNOSIS — Z7409 Other reduced mobility: Secondary | ICD-10-CM | POA: Diagnosis not present

## 2021-05-23 DIAGNOSIS — R29898 Other symptoms and signs involving the musculoskeletal system: Secondary | ICD-10-CM | POA: Diagnosis not present

## 2021-05-23 DIAGNOSIS — Z96652 Presence of left artificial knee joint: Secondary | ICD-10-CM | POA: Diagnosis not present

## 2021-05-23 DIAGNOSIS — M25462 Effusion, left knee: Secondary | ICD-10-CM | POA: Diagnosis not present

## 2021-05-23 DIAGNOSIS — M25662 Stiffness of left knee, not elsewhere classified: Secondary | ICD-10-CM | POA: Diagnosis not present

## 2021-05-28 DIAGNOSIS — M25662 Stiffness of left knee, not elsewhere classified: Secondary | ICD-10-CM | POA: Diagnosis not present

## 2021-05-28 DIAGNOSIS — M25462 Effusion, left knee: Secondary | ICD-10-CM | POA: Diagnosis not present

## 2021-05-28 DIAGNOSIS — R29898 Other symptoms and signs involving the musculoskeletal system: Secondary | ICD-10-CM | POA: Diagnosis not present

## 2021-05-28 DIAGNOSIS — Z96652 Presence of left artificial knee joint: Secondary | ICD-10-CM | POA: Diagnosis not present

## 2021-05-28 DIAGNOSIS — Z7409 Other reduced mobility: Secondary | ICD-10-CM | POA: Diagnosis not present

## 2021-05-28 DIAGNOSIS — M25562 Pain in left knee: Secondary | ICD-10-CM | POA: Diagnosis not present

## 2021-05-30 DIAGNOSIS — M25462 Effusion, left knee: Secondary | ICD-10-CM | POA: Diagnosis not present

## 2021-05-30 DIAGNOSIS — R29898 Other symptoms and signs involving the musculoskeletal system: Secondary | ICD-10-CM | POA: Diagnosis not present

## 2021-05-30 DIAGNOSIS — Z96652 Presence of left artificial knee joint: Secondary | ICD-10-CM | POA: Diagnosis not present

## 2021-05-30 DIAGNOSIS — Z7409 Other reduced mobility: Secondary | ICD-10-CM | POA: Diagnosis not present

## 2021-05-30 DIAGNOSIS — M25562 Pain in left knee: Secondary | ICD-10-CM | POA: Diagnosis not present

## 2021-05-30 DIAGNOSIS — M25662 Stiffness of left knee, not elsewhere classified: Secondary | ICD-10-CM | POA: Diagnosis not present

## 2021-06-03 ENCOUNTER — Other Ambulatory Visit: Payer: Self-pay | Admitting: Family Medicine

## 2021-06-03 NOTE — Telephone Encounter (Signed)
Ok to refill??  Last office visit 03/07/2021.  Last refill 05/03/2021.  Ok to add refills to prescription?

## 2021-06-04 DIAGNOSIS — Z7409 Other reduced mobility: Secondary | ICD-10-CM | POA: Diagnosis not present

## 2021-06-04 DIAGNOSIS — R29898 Other symptoms and signs involving the musculoskeletal system: Secondary | ICD-10-CM | POA: Diagnosis not present

## 2021-06-04 DIAGNOSIS — M25562 Pain in left knee: Secondary | ICD-10-CM | POA: Diagnosis not present

## 2021-06-04 DIAGNOSIS — Z96652 Presence of left artificial knee joint: Secondary | ICD-10-CM | POA: Diagnosis not present

## 2021-06-04 DIAGNOSIS — M25462 Effusion, left knee: Secondary | ICD-10-CM | POA: Diagnosis not present

## 2021-06-04 DIAGNOSIS — M25662 Stiffness of left knee, not elsewhere classified: Secondary | ICD-10-CM | POA: Diagnosis not present

## 2021-06-06 DIAGNOSIS — M25562 Pain in left knee: Secondary | ICD-10-CM | POA: Diagnosis not present

## 2021-06-06 DIAGNOSIS — Z7409 Other reduced mobility: Secondary | ICD-10-CM | POA: Diagnosis not present

## 2021-06-06 DIAGNOSIS — Z96652 Presence of left artificial knee joint: Secondary | ICD-10-CM | POA: Diagnosis not present

## 2021-06-06 DIAGNOSIS — M25462 Effusion, left knee: Secondary | ICD-10-CM | POA: Diagnosis not present

## 2021-06-06 DIAGNOSIS — M25662 Stiffness of left knee, not elsewhere classified: Secondary | ICD-10-CM | POA: Diagnosis not present

## 2021-06-06 DIAGNOSIS — R29898 Other symptoms and signs involving the musculoskeletal system: Secondary | ICD-10-CM | POA: Diagnosis not present

## 2021-06-11 ENCOUNTER — Ambulatory Visit (INDEPENDENT_AMBULATORY_CARE_PROVIDER_SITE_OTHER): Payer: Medicare Other | Admitting: *Deleted

## 2021-06-11 DIAGNOSIS — Z23 Encounter for immunization: Secondary | ICD-10-CM | POA: Diagnosis not present

## 2021-06-11 DIAGNOSIS — Z7409 Other reduced mobility: Secondary | ICD-10-CM | POA: Diagnosis not present

## 2021-06-11 DIAGNOSIS — M25562 Pain in left knee: Secondary | ICD-10-CM | POA: Diagnosis not present

## 2021-06-11 DIAGNOSIS — M25662 Stiffness of left knee, not elsewhere classified: Secondary | ICD-10-CM | POA: Diagnosis not present

## 2021-06-11 DIAGNOSIS — M25462 Effusion, left knee: Secondary | ICD-10-CM | POA: Diagnosis not present

## 2021-06-11 DIAGNOSIS — R29898 Other symptoms and signs involving the musculoskeletal system: Secondary | ICD-10-CM | POA: Diagnosis not present

## 2021-06-11 DIAGNOSIS — Z96652 Presence of left artificial knee joint: Secondary | ICD-10-CM | POA: Diagnosis not present

## 2021-06-13 DIAGNOSIS — M25562 Pain in left knee: Secondary | ICD-10-CM | POA: Diagnosis not present

## 2021-06-13 DIAGNOSIS — R29898 Other symptoms and signs involving the musculoskeletal system: Secondary | ICD-10-CM | POA: Diagnosis not present

## 2021-06-13 DIAGNOSIS — M25462 Effusion, left knee: Secondary | ICD-10-CM | POA: Diagnosis not present

## 2021-06-13 DIAGNOSIS — Z20822 Contact with and (suspected) exposure to covid-19: Secondary | ICD-10-CM | POA: Diagnosis not present

## 2021-06-13 DIAGNOSIS — Z96652 Presence of left artificial knee joint: Secondary | ICD-10-CM | POA: Diagnosis not present

## 2021-06-13 DIAGNOSIS — Z7409 Other reduced mobility: Secondary | ICD-10-CM | POA: Diagnosis not present

## 2021-06-13 DIAGNOSIS — M25662 Stiffness of left knee, not elsewhere classified: Secondary | ICD-10-CM | POA: Diagnosis not present

## 2021-06-18 ENCOUNTER — Other Ambulatory Visit: Payer: Self-pay | Admitting: Family Medicine

## 2021-06-20 DIAGNOSIS — M25562 Pain in left knee: Secondary | ICD-10-CM | POA: Diagnosis not present

## 2021-06-20 DIAGNOSIS — Z96652 Presence of left artificial knee joint: Secondary | ICD-10-CM | POA: Diagnosis not present

## 2021-06-20 DIAGNOSIS — Z7409 Other reduced mobility: Secondary | ICD-10-CM | POA: Diagnosis not present

## 2021-06-20 DIAGNOSIS — M25462 Effusion, left knee: Secondary | ICD-10-CM | POA: Diagnosis not present

## 2021-06-20 DIAGNOSIS — M25662 Stiffness of left knee, not elsewhere classified: Secondary | ICD-10-CM | POA: Diagnosis not present

## 2021-06-20 DIAGNOSIS — R29898 Other symptoms and signs involving the musculoskeletal system: Secondary | ICD-10-CM | POA: Diagnosis not present

## 2021-06-22 DIAGNOSIS — Z20822 Contact with and (suspected) exposure to covid-19: Secondary | ICD-10-CM | POA: Diagnosis not present

## 2021-06-25 DIAGNOSIS — M25662 Stiffness of left knee, not elsewhere classified: Secondary | ICD-10-CM | POA: Diagnosis not present

## 2021-06-25 DIAGNOSIS — Z7409 Other reduced mobility: Secondary | ICD-10-CM | POA: Diagnosis not present

## 2021-06-25 DIAGNOSIS — M25462 Effusion, left knee: Secondary | ICD-10-CM | POA: Diagnosis not present

## 2021-06-25 DIAGNOSIS — R29898 Other symptoms and signs involving the musculoskeletal system: Secondary | ICD-10-CM | POA: Diagnosis not present

## 2021-06-25 DIAGNOSIS — Z96652 Presence of left artificial knee joint: Secondary | ICD-10-CM | POA: Diagnosis not present

## 2021-06-25 DIAGNOSIS — M25562 Pain in left knee: Secondary | ICD-10-CM | POA: Diagnosis not present

## 2021-06-27 ENCOUNTER — Ambulatory Visit (INDEPENDENT_AMBULATORY_CARE_PROVIDER_SITE_OTHER): Payer: Medicare Other | Admitting: Urology

## 2021-06-27 ENCOUNTER — Other Ambulatory Visit: Payer: Self-pay

## 2021-06-27 VITALS — BP 143/91 | HR 105 | Temp 98.3°F | Wt 173.0 lb

## 2021-06-27 DIAGNOSIS — N952 Postmenopausal atrophic vaginitis: Secondary | ICD-10-CM | POA: Diagnosis not present

## 2021-06-27 DIAGNOSIS — N3941 Urge incontinence: Secondary | ICD-10-CM | POA: Diagnosis not present

## 2021-06-27 DIAGNOSIS — Z87440 Personal history of urinary (tract) infections: Secondary | ICD-10-CM | POA: Diagnosis not present

## 2021-06-27 DIAGNOSIS — R350 Frequency of micturition: Secondary | ICD-10-CM

## 2021-06-27 DIAGNOSIS — N3281 Overactive bladder: Secondary | ICD-10-CM

## 2021-06-27 LAB — URINALYSIS, ROUTINE W REFLEX MICROSCOPIC
Bilirubin, UA: NEGATIVE
Glucose, UA: NEGATIVE
Ketones, UA: NEGATIVE
Nitrite, UA: NEGATIVE
Protein,UA: NEGATIVE
RBC, UA: NEGATIVE
Specific Gravity, UA: 1.02 (ref 1.005–1.030)
Urobilinogen, Ur: 0.2 mg/dL (ref 0.2–1.0)
pH, UA: 5.5 (ref 5.0–7.5)

## 2021-06-27 LAB — MICROSCOPIC EXAMINATION
RBC, Urine: NONE SEEN /hpf (ref 0–2)
Renal Epithel, UA: NONE SEEN /hpf

## 2021-06-27 MED ORDER — GEMTESA 75 MG PO TABS: 75.0000 mg | ORAL_TABLET | Freq: Every day | ORAL | 0 refills | Status: DC

## 2021-06-27 NOTE — Progress Notes (Signed)
Urological Symptom Review  Patient is experiencing the following symptoms: Frequent urination Get up at night to urinate Leakage of urine Stream starts and stops   Review of Systems  Gastrointestinal (upper)  : Indigestion/heartburn  Gastrointestinal (lower) : Negative for lower GI symptoms  Constitutional : Negative for symptoms  Skin: Negative for skin symptoms  Eyes: Negative for eye symptoms  Ear/Nose/Throat : Negative for Ear/Nose/Throat symptoms  Hematologic/Lymphatic: Negative for Hematologic/Lymphatic symptoms  Cardiovascular : Negative for cardiovascular symptoms  Respiratory : Negative for respiratory symptoms  Endocrine: Negative for endocrine symptoms  Musculoskeletal: Joint pain  Neurological: Negative for neurological symptoms  Psychologic: Negative for psychiatric symptoms

## 2021-06-27 NOTE — Progress Notes (Signed)
Subjective:  1. Overactive bladder   2. Urge incontinence   3. Urine frequency   4. Vaginal atrophy   5. Personal history of urinary infection     Allison Freeman returns today in f/u.  She has a history of OAB wet and prior UTI's with vaginal atrophy.   He was last seen in the [place] office and had previously been on myrbetriq and PTNS with good success.  She had been on topical estrogen as well.  She is no longer on the meds and exhausted her PTNS benefit.   She has had no recent UTI's.   She had a left knee replacement on 06/13/21 and had some increase frequency and urgency for a week or two but that has settled down.   She continues to have urgency with UUI and continues to use pads.  She has no significant SUI.   She has nocturia x 1.  Her UA has 6-10 WBC and a few bacteria.       ROS:  ROS:  A complete review of systems was performed.  All systems are negative except for pertinent findings as noted.   ROS  Allergies  Allergen Reactions   Other Other (See Comments)    Arthritis medications- causes a bacteria in the abdomen    Outpatient Encounter Medications as of 06/27/2021  Medication Sig   acyclovir (ZOVIRAX) 400 MG tablet Take 400 mg by mouth 3 (three) times daily.   ALPRAZolam (XANAX) 0.5 MG tablet TAKE ONE TABLET BY MOUTH EVERY NIGHT AT BEDTIME   Black Pepper-Turmeric (TURMERIC COMPLEX/BLACK PEPPER PO) Take 1 tablet by mouth daily.   Calcium-Magnesium-Vitamin D (CALCIUM 1200+D3 PO) Take 1 tablet by mouth daily.   Carboxymethylcellul-Glycerin (LUBRICATING EYE DROPS OP) Place 1 drop into both eyes 2 (two) times daily.   cyclobenzaprine (FLEXERIL) 10 MG tablet TAKE ONE TABLET (10MG  TOTAL) BY MOUTH THREE TIMES DAILY AS NEEDED FOR MUSCLE SPASMS   denosumab (PROLIA) 60 MG/ML SOSY injection Inject 60 mg into the skin every 6 (six) months.   fluticasone (FLONASE) 50 MCG/ACT nasal spray Use 2 sprays in each  nostril once daily (Patient taking differently: Place 2 sprays into both  nostrils at bedtime.)   levocetirizine (XYZAL) 5 MG tablet Take 5 mg by mouth daily.   losartan (COZAAR) 100 MG tablet TAKE ONE TABLET (100MG  TOTAL) BY MOUTH DAILY   MELATONIN ER PO Take by mouth.   Misc Natural Products (GLUCOSAMINE CHONDROITIN TRIPLE) TABS Take 1 tablet by mouth 2 (two) times daily.   Multiple Vitamin (MULTIVITAMIN) tablet Take 1 tablet by mouth daily.   Omega-3 Fatty Acids (FISH OIL) 1200 MG CAPS Take 1,200 mg by mouth 2 (two) times daily.    pantoprazole (PROTONIX) 40 MG tablet TAKE ONE TABLET TWICE DAILY BEFORE MEALS   pravastatin (PRAVACHOL) 40 MG tablet TAKE ONE (1) TABLET BY MOUTH EVERY DAY   rOPINIRole (REQUIP) 2 MG tablet TAKE ONE TABLET BY MOUTH EVERY NIGHT AT BEDTIME   traMADol (ULTRAM) 50 MG tablet Take 1 tablet (50 mg total) by mouth 3 (three) times daily as needed.   Vibegron (GEMTESA) 75 MG TABS Take 75 mg by mouth daily.   [DISCONTINUED] predniSONE (DELTASONE) 20 MG tablet Take 2 tablets (40 mg total) by mouth daily.   Facility-Administered Encounter Medications as of 06/27/2021  Medication   denosumab (PROLIA) injection 60 mg    Past Medical History:  Diagnosis Date   Acid reflux    Arthritis    Depression    Family  history of adverse reaction to anesthesia    Mother woke up and could hearduring surgery   Hypercholesterolemia    Hypertension    Insomnia    Osteoporosis    Panic attacks    panic attacks   Reflux    Restless leg syndrome     Past Surgical History:  Procedure Laterality Date   ABDOMINAL HYSTERECTOMY  2001   BLADDER SURGERY     BREAST BIOPSY  1987   BREAST CYST EXCISION  1998   CARPAL TUNNEL RELEASE Right 04/06/2015   Procedure: CARPAL TUNNEL RELEASE;  Surgeon: Carole Civil, MD;  Location: [facility];  Service: Orthopedics;  Laterality: Right;   COLONOSCOPY  09/19/2004   DDU:KGURKYHC hemorrhoids, otherwise normal rectum/Sigmoid diverticula.  Remainder of colon mucosa appeared normal   COLONOSCOPY WITH  ESOPHAGOGASTRODUODENOSCOPY (EGD) N/A 06/24/2013   WCB:JSEGBTD polyp-removed. Small hiatal hernia. Abnormal gastric mucosa-query NSAID effect-s/p (chronic inactive gastritis, negative H. pylori)/TCS: Prominent internal hemorrhoids-likely source of hematochezia. Colonic diverticulosis. Colonic polyp-removed (tubular adenoma)   COLONOSCOPY WITH PROPOFOL N/A 11/01/2018   Procedure: COLONOSCOPY WITH PROPOFOL;  Surgeon: Daneil Dolin, MD;  Location: [facility];  Service: Endoscopy;  Laterality: N/A;  7:30am   CYSTOCELE REPAIR  2010   DILATION AND CURETTAGE OF UTERUS     JOINT REPLACEMENT N/A    [employer] 03/02/2020   KNEE SURGERY Right 2003   arthroscopic left   RECTAL SURGERY     RECTOCELE REPAIR  2010   REPLACEMENT TOTAL KNEE Right    TONSILLECTOMY     TOTAL KNEE ARTHROPLASTY Right 05/09/2019   Procedure: TOTAL KNEE ARTHROPLASTY;  Surgeon: Vickey Huger, MD;  Location: [facility];  Service: Orthopedics;  Laterality: Right;    Social History   Socioeconomic History   Marital status: Married    Spouse name: Not on file   Number of children: Not on file   Years of education: Not on file   Highest education level: Not on file  Occupational History   Not on file  Tobacco Use   Smoking status: Never   Smokeless tobacco: Never  Vaping Use   Vaping Use: Never used  Substance and Sexual Activity   Alcohol use: No   Drug use: No   Sexual activity: Yes    Birth control/protection: Surgical  Other Topics Concern   Not on file  Social History Narrative   Not on file   Social Determinants of Health   Financial Resource Strain: Not on file  Food Insecurity: Not on file  Transportation Needs: Not on file  Physical Activity: Not on file  Stress: Not on file  Social Connections: Not on file  Intimate Partner Violence: Not on file    Family History  Problem Relation Age of Onset   Cancer Mother        Breast   Hypertension Father    Heart attack Father    Cancer Sister        Breast    Cancer Sister        breast   Breast cancer Sister    Colon cancer Neg Hx        Objective: Vitals:   06/27/21 1056  BP: (!) 143/91  Pulse: (!) 105  Temp: 98.3 F (36.8 C)     Physical Exam  Lab Results:  Recent Results (from the past 2160 hour(s))  Urinalysis, Routine w reflex microscopic     Status: Abnormal   Collection Time: 06/27/21 10:59 AM  Result Value  Ref Range   Specific Gravity, UA 1.020 1.005 - 1.030   pH, UA 5.5 5.0 - 7.5   Color, UA Yellow Yellow   Appearance Ur Clear Clear   Leukocytes,UA Trace (A) Negative   Protein,UA Negative Negative/Trace   Glucose, UA Negative Negative   Ketones, UA Negative Negative   RBC, UA Negative Negative   Bilirubin, UA Negative Negative   Urobilinogen, Ur 0.2 0.2 - 1.0 mg/dL   Nitrite, UA Negative Negative   Microscopic Examination See below:   Microscopic Examination     Status: Abnormal   Collection Time: 06/27/21 10:59 AM   Urine  Result Value Ref Range   WBC, UA 6-10 (A) 0 - 5 /hpf   RBC None seen 0 - 2 /hpf   Epithelial Cells (non renal) 0-10 0 - 10 /hpf   Renal Epithel, UA None seen None seen /hpf   Bacteria, UA Few (A) None seen/Few     Studies/Results: DG Bone Density  Result Date: 05/06/2021 EXAM: DUAL X-RAY ABSORPTIOMETRY (DXA) FOR BONE MINERAL DENSITY IMPRESSION: Your patient Sorayah Schrodt completed a BMD test on 05/06/2021 using the [employer] (software version: 14.10) manufactured by [employer]. The following summarizes the results of our evaluation. Technologist: [facility] PATIENT BIOGRAPHICAL: Name: Yeraldine, Forney Patient ID: [id number] Birth Date: [birth year] Height: 65.0 in. Gender: Female Exam Date: 05/06/2021 Weight: 173.0 lbs. Indications: Bilateral Oophrectomy, Caucasian, Follow up Osteopenia, Osteoarthritis, Post Menopausal Fractures: Treatments: Calcium, Multivitamin, Prolia, Vitamin D DENSITOMETRY RESULTS: Site          Region     Measured Date Measured Age WHO  Classification Young Adult T-score BMD         %Change vs. Previous Significant Change (*) DualFemur Neck Right 05/06/2021 72.8 Normal -0.7 0.942 g/cm2 -6.7% Yes DualFemur Neck Right 02/01/2009 60.6 Normal -0.2 1.010 g/cm2 6.8% Yes DualFemur Neck Right 03/24/2006 57.7 Normal -0.7 0.946 g/cm2 - - DualFemur Total Mean 05/06/2021 72.8 Normal 0.1 1.020 g/cm2 -6.9% Yes DualFemur Total Mean 02/01/2009 60.6 Normal 0.7 1.096 g/cm2 7.8% Yes DualFemur Total Mean 03/24/2006 57.7 Normal 0.1 1.017 g/cm2 - - Right Forearm Radius 33% 05/06/2021 72.8 Osteopenia -2.3 0.551 g/cm2 -3.0% - Right Forearm Radius 33% 02/01/2009 60.6 Osteopenia -2.0 0.568 g/cm2 -1.3% - Right Forearm Radius 33% 03/24/2006 57.7 Osteopenia -1.9 0.576 g/cm2 - - ASSESSMENT: The BMD measured at Forearm Radius 33% is 0.551 g/cm2 with a T-score of -2.3. This patient is considered osteopenic according to [facility]) criteria. The scan quality is good. Compared with the prior study on 02/01/2009, the BMD of the total mean shows a statistically significant decrease. Lumbar spine was excluded due to advanced degenerative changes. Patient is not a candidate for FRAX assessment due to patient being on prolia. Per official position of the ISCD, it is not possible to quantitatively compare BMD or calculate a LSC between different facilities or devices. [facility] Pharmacologist [facility]) criteria for post-menopausal, Caucasian Women: Normal:       T-score at or above -1 SD Osteopenia:   T-score between -1 and -2.5 SD Osteoporosis: T-score at or below -2.5 SD RECOMMENDATIONS: 1. All patients should optimize calcium and vitamin D intake. 2. Consider FDA-approved medical therapies in postmenopausal women and med aged 7 years and older, based on the following: a. A hip or vertebral (clinical or morphometric) fracture b. T-score< -2.5 at the femoral neck or spine after appropriate evaluation to exclude secondary causes c. Low bone mass (T-score between -1.0 and  -  2.5 at the femoral neck or spine) and a 10-year probability of a hip fracture > 3% or a 10-year probability of a major osteoporosis-related fracture > 20% based on the [place]-adapted WHO algorithm d. Clinician judgment and/or patient preferences may indicate treatment for people with 10-year fracture probabilities above or below these levels FOLLOW-UP: Patients with diagnosis of osteoporsis or at high risk for fracture should have regular bone mineral density tests. For patients eligible for Medicare, routine testing is allowed once every 2 years. The testing frequency can be increased to one year for patients who have rapidly progressing disease, those who are receiving or discontinuing medical therapy to restore bone mass, or have additional risk factors. I have reviewed this report, and agree with the above findings. Mark A. Thornton Papas, [name] [facility] Electronically Signed   By: Lavonia Dana [name]   On: 05/06/2021 11:19   MM 3D SCREEN BREAST BILATERAL  Result Date: 05/07/2021 CLINICAL DATA:  Screening. EXAM: DIGITAL SCREENING BILATERAL MAMMOGRAM WITH TOMOSYNTHESIS AND CAD TECHNIQUE: Bilateral screening digital craniocaudal and mediolateral oblique mammograms were obtained. Bilateral screening digital breast tomosynthesis was performed. The images were evaluated with computer-aided detection. COMPARISON:  Previous exam(s). ACR Breast Density Category c: The breast tissue is heterogeneously dense, which may obscure small masses. FINDINGS: There are no findings suspicious for malignancy. IMPRESSION: No mammographic evidence of malignancy. A result letter of this screening mammogram will be mailed directly to the patient. RECOMMENDATION: Screening mammogram in one year. (Code:SM-B-01Y) BI-RADS CATEGORY  1: Negative. Electronically Signed   By: Kristopher Oppenheim [name]   On: 05/07/2021 10:29       Assessment & Plan: OAB wet.   I am going to try him on Gemtesa and will have her return in 6 months.   I will  also investigate the current coverage limits for PTNS but she appears to have exhausted those. .      Meds ordered this encounter  Medications   Vibegron (GEMTESA) 75 MG TABS    Sig: Take 75 mg by mouth daily.    Dispense:  28 tablet    Refill:  0      Orders Placed This Encounter  Procedures   Microscopic Examination   Urinalysis, Routine w reflex microscopic      Return in about 6 months (around 12/26/2021).   CC: Susy Frizzle, MD      Irine Seal 06/27/2021  Patient ID: Reino Bellis, female   DOB: 06-06-1946, 75 y.o.   MRN: [id number]

## 2021-07-02 DIAGNOSIS — M25562 Pain in left knee: Secondary | ICD-10-CM | POA: Diagnosis not present

## 2021-07-02 DIAGNOSIS — Z96652 Presence of left artificial knee joint: Secondary | ICD-10-CM | POA: Diagnosis not present

## 2021-07-02 DIAGNOSIS — M25462 Effusion, left knee: Secondary | ICD-10-CM | POA: Diagnosis not present

## 2021-07-02 DIAGNOSIS — R29898 Other symptoms and signs involving the musculoskeletal system: Secondary | ICD-10-CM | POA: Diagnosis not present

## 2021-07-02 DIAGNOSIS — Z7409 Other reduced mobility: Secondary | ICD-10-CM | POA: Diagnosis not present

## 2021-07-02 DIAGNOSIS — M25662 Stiffness of left knee, not elsewhere classified: Secondary | ICD-10-CM | POA: Diagnosis not present

## 2021-07-03 ENCOUNTER — Other Ambulatory Visit: Payer: Self-pay | Admitting: Family Medicine

## 2021-07-09 DIAGNOSIS — Z7409 Other reduced mobility: Secondary | ICD-10-CM | POA: Diagnosis not present

## 2021-07-09 DIAGNOSIS — Z96652 Presence of left artificial knee joint: Secondary | ICD-10-CM | POA: Diagnosis not present

## 2021-07-09 DIAGNOSIS — M25662 Stiffness of left knee, not elsewhere classified: Secondary | ICD-10-CM | POA: Diagnosis not present

## 2021-07-09 DIAGNOSIS — R29898 Other symptoms and signs involving the musculoskeletal system: Secondary | ICD-10-CM | POA: Diagnosis not present

## 2021-07-09 DIAGNOSIS — M25462 Effusion, left knee: Secondary | ICD-10-CM | POA: Diagnosis not present

## 2021-07-09 DIAGNOSIS — M25562 Pain in left knee: Secondary | ICD-10-CM | POA: Diagnosis not present

## 2021-07-11 ENCOUNTER — Other Ambulatory Visit: Payer: Self-pay | Admitting: Family Medicine

## 2021-07-23 DIAGNOSIS — Z20822 Contact with and (suspected) exposure to covid-19: Secondary | ICD-10-CM | POA: Diagnosis not present

## 2021-08-05 ENCOUNTER — Other Ambulatory Visit: Payer: Self-pay | Admitting: Family Medicine

## 2021-08-13 ENCOUNTER — Telehealth (INDEPENDENT_AMBULATORY_CARE_PROVIDER_SITE_OTHER): Payer: Medicare Other | Admitting: Nurse Practitioner

## 2021-08-13 ENCOUNTER — Other Ambulatory Visit: Payer: Self-pay

## 2021-08-13 ENCOUNTER — Encounter: Payer: Self-pay | Admitting: Nurse Practitioner

## 2021-08-13 DIAGNOSIS — J069 Acute upper respiratory infection, unspecified: Secondary | ICD-10-CM | POA: Diagnosis not present

## 2021-08-13 MED ORDER — AMOXICILLIN-POT CLAVULANATE 875-125 MG PO TABS: 1.0000 | ORAL_TABLET | Freq: Two times a day (BID) | ORAL | 0 refills | Status: AC

## 2021-08-13 NOTE — Progress Notes (Signed)
Subjective:    Patient ID: Allison Freeman, female    DOB: [birth year], 75 y.o.   MRN: [id number]  HPI: Allison Freeman is a 75 y.o. female presenting virtually for cough and sore throat.  Chief Complaint  Patient presents with   Cough   Sore Throat   UPPER RESPIRATORY TRACT INFECTION Onset: 11/16 COVID-19 testing history: negative 11/20 Fever: no Chills: no Body aches: no Cough: yes; congested Shortness of breath: no Wheezing: no Chest pain: no Chest tightness: no Chest congestion: yes Nasal congestion: yes Runny nose: yes Post nasal drip: yes Sneezing: no Sore throat: yes Swollen glands: yes; on left side Sinus pressure: yes; bilateral Headache: yes Face pain: no Toothache: no Ear pain: no  Ear pressure:  yes; left ear   Eyes red/itching: no Eye drainage/crusting: yes; watery Nausea: no  Vomiting: no Diarrhea: no  Change in appetite: no  Loss of taste/smell: no  Rash: no Fatigue: no Sick contacts: no Strep contacts: no  Context: better Recurrent sinusitis: no Treatments attempted: Xyzal and flonase, Mucinex, throat lozenges Relief with OTC medications: yes  Allergies  Allergen Reactions   Other Other (See Comments)    Arthritis medications- causes a bacteria in the abdomen    Outpatient Encounter Medications as of 08/13/2021  Medication Sig   amoxicillin-clavulanate (AUGMENTIN) 875-125 MG tablet Take 1 tablet by mouth 2 (two) times daily for 7 days.   acyclovir (ZOVIRAX) 400 MG tablet Take 400 mg by mouth 3 (three) times daily.   ALPRAZolam (XANAX) 0.5 MG tablet TAKE ONE TABLET BY MOUTH EVERY NIGHT AT BEDTIME   Black Pepper-Turmeric (TURMERIC COMPLEX/BLACK PEPPER PO) Take 1 tablet by mouth daily.   Calcium-Magnesium-Vitamin D (CALCIUM 1200+D3 PO) Take 1 tablet by mouth daily.   Carboxymethylcellul-Glycerin (LUBRICATING EYE DROPS OP) Place 1 drop into both eyes 2 (two) times daily.   cyclobenzaprine (FLEXERIL) 10 MG tablet TAKE ONE TABLET  (10MG  TOTAL) BY MOUTH THREE TIMES DAILY AS NEEDED FOR MUSCLE SPASMS   denosumab (PROLIA) 60 MG/ML SOSY injection Inject 60 mg into the skin every 6 (six) months.   fluticasone (FLONASE) 50 MCG/ACT nasal spray Use 2 sprays in each  nostril once daily (Patient taking differently: Place 2 sprays into both nostrils at bedtime.)   levocetirizine (XYZAL) 5 MG tablet Take 5 mg by mouth daily.   losartan (COZAAR) 100 MG tablet TAKE ONE TABLET (100MG  TOTAL) BY MOUTH DAILY   MELATONIN ER PO Take by mouth.   Misc Natural Products (GLUCOSAMINE CHONDROITIN TRIPLE) TABS Take 1 tablet by mouth 2 (two) times daily.   Multiple Vitamin (MULTIVITAMIN) tablet Take 1 tablet by mouth daily.   Omega-3 Fatty Acids (FISH OIL) 1200 MG CAPS Take 1,200 mg by mouth 2 (two) times daily.    pantoprazole (PROTONIX) 40 MG tablet TAKE ONE TABLET TWICE DAILY BEFORE MEALS   pravastatin (PRAVACHOL) 40 MG tablet TAKE ONE (1) TABLET BY MOUTH EVERY DAY   rOPINIRole (REQUIP) 2 MG tablet TAKE ONE TABLET BY MOUTH EVERY NIGHT AT BEDTIME   traMADol (ULTRAM) 50 MG tablet Take 1 tablet (50 mg total) by mouth 3 (three) times daily as needed.   [DISCONTINUED] Vibegron (GEMTESA) 75 MG TABS Take 75 mg by mouth daily.   Facility-Administered Encounter Medications as of 08/13/2021  Medication   denosumab (PROLIA) injection 60 mg    Patient Active Problem List   Diagnosis Date Noted   S/P total knee replacement 05/09/2019   Osteoporosis    History of colonic polyps  08/31/2018   Carpal tunnel syndrome of right wrist    Osteoarthritis 01/21/2014   Abdominal pain, epigastric 06/16/2013   Rectal bleeding 06/16/2013   Hemorrhoids 06/16/2013   Other and unspecified hyperlipidemia 05/16/2013   Allergic rhinitis 05/16/2013   Esophageal reflux 04/15/2013   Difficulty in walking(719.7) 12/20/2012   Bilateral leg weakness 12/20/2012   Hip bursitis 12/14/2012   Back pain 12/14/2012   Hip pain, bilateral 12/14/2012    Past Medical History:   Diagnosis Date   Acid reflux    Arthritis    Depression    Family history of adverse reaction to anesthesia    Mother woke up and could hearduring surgery   Hypercholesterolemia    Hypertension    Insomnia    Osteoporosis    Panic attacks    panic attacks   Reflux    Restless leg syndrome     Relevant past medical, surgical, family and social history reviewed and updated as indicated. Interim medical history since our last visit reviewed.  Review of Systems Per HPI unless specifically indicated above     Objective:    There were no vitals taken for this visit.  Wt Readings from Last 3 Encounters:  06/27/21 173 lb (78.5 kg)  03/07/21 173 lb (78.5 kg)  07/03/20 173 lb (78.5 kg)    Physical Exam Physical examination unable to be performed due to lack of equipment.  Patient talking in complete sentences during telemedicine visit.    Assessment & Plan:  1. Upper respiratory tract infection, unspecified type Acute x 6 days.  Discussed symptoms likely viral in etiology.  Reassured patient that symptoms are most consistent with a viral upper respiratory infection and explained lack of efficacy of antibiotics against viruses.  Discussed expected course and features suggestive of secondary bacterial infection.  Continue supportive care. Increase fluid intake with water or electrolyte solution like pedialyte. Encouraged acetaminophen as needed for fever/pain. Encouraged salt water gargling, chloraseptic spray and throat lozenges. Encouraged OTC guaifenesin. Encouraged saline sinus flushes and/or neti with humidified air.   Will give prescription for antibiotics if symptoms worsen over holiday weekend or if they continue to improve and then worsen.  Follow up afterwards with no improvement.  Educated not to start antibiotics until Friday at a minimum - try to let virus run its course.   Follow up plan: Return if symptoms worsen or fail to improve.  This visit was completed via  telephone due to the restrictions of the COVID-19 pandemic. All issues as above were discussed and addressed but no physical exam was performed. If it was felt that the patient should be evaluated in the office, they were directed there. The patient verbally consented to this visit. Patient was unable to complete an audio/visual visit due to Technical difficulties.  Patient received link but when she clicked connect, nothing happened on her phone. Location of the patient: home Location of the provider: home Those involved with this call:  Provider: Noemi Chapel, DNP, FNP-C CMA: n/a Front Desk/Registration: Vevelyn Pat  Time spent on call:  9 minutes on the phone discussing health concerns. 16 minutes total spent in review of patient's record and preparation of their chart. I verified patient identity using two factors (patient name and date of birth). Patient consents verbally to being seen via telemedicine visit today.

## 2021-08-21 ENCOUNTER — Other Ambulatory Visit: Payer: Self-pay | Admitting: Family Medicine

## 2021-08-21 DIAGNOSIS — I10 Essential (primary) hypertension: Secondary | ICD-10-CM

## 2021-08-28 ENCOUNTER — Other Ambulatory Visit: Payer: Self-pay | Admitting: Family Medicine

## 2021-09-06 ENCOUNTER — Other Ambulatory Visit: Payer: Self-pay

## 2021-09-06 ENCOUNTER — Ambulatory Visit (INDEPENDENT_AMBULATORY_CARE_PROVIDER_SITE_OTHER): Payer: Medicare Other

## 2021-09-06 DIAGNOSIS — M810 Age-related osteoporosis without current pathological fracture: Secondary | ICD-10-CM

## 2021-09-06 DIAGNOSIS — M8588 Other specified disorders of bone density and structure, other site: Secondary | ICD-10-CM | POA: Diagnosis not present

## 2021-09-10 ENCOUNTER — Other Ambulatory Visit: Payer: Self-pay | Admitting: Family Medicine

## 2021-10-04 ENCOUNTER — Other Ambulatory Visit: Payer: Self-pay | Admitting: Family Medicine

## 2021-10-04 NOTE — Telephone Encounter (Signed)
Xanax refill request.  Last filled 06/04/2021 last seen 08/13/2021.

## 2021-10-10 ENCOUNTER — Other Ambulatory Visit: Payer: Self-pay | Admitting: Family Medicine

## 2021-10-10 NOTE — Telephone Encounter (Signed)
Flexeril refill request.  Last seen 08/13/2021, last filled 09/10/2021.

## 2021-11-04 ENCOUNTER — Other Ambulatory Visit: Payer: Self-pay | Admitting: Family Medicine

## 2021-11-07 DIAGNOSIS — Z96652 Presence of left artificial knee joint: Secondary | ICD-10-CM | POA: Diagnosis not present

## 2021-11-12 ENCOUNTER — Other Ambulatory Visit: Payer: Self-pay | Admitting: Family Medicine

## 2021-12-02 ENCOUNTER — Other Ambulatory Visit: Payer: Self-pay | Admitting: Family Medicine

## 2021-12-03 ENCOUNTER — Encounter: Payer: Self-pay | Admitting: Family Medicine

## 2021-12-03 NOTE — Telephone Encounter (Signed)
LOV 03/07/21 ? ?Last refill Tramadol ?03/07/21, #270, 2 refills ? ?Last refill Alprazolam ?11/04/21, #30, 0 refills ? ?Please review, thanks! ? ?

## 2021-12-10 ENCOUNTER — Other Ambulatory Visit: Payer: Self-pay | Admitting: Family Medicine

## 2021-12-20 DIAGNOSIS — Z1159 Encounter for screening for other viral diseases: Secondary | ICD-10-CM | POA: Diagnosis not present

## 2021-12-23 ENCOUNTER — Other Ambulatory Visit: Payer: Self-pay | Admitting: Family Medicine

## 2021-12-25 ENCOUNTER — Other Ambulatory Visit: Payer: Self-pay | Admitting: Family Medicine

## 2021-12-25 ENCOUNTER — Encounter: Payer: Self-pay | Admitting: Family Medicine

## 2021-12-26 ENCOUNTER — Ambulatory Visit: Payer: Medicare Other | Admitting: Urology

## 2022-01-02 ENCOUNTER — Ambulatory Visit (INDEPENDENT_AMBULATORY_CARE_PROVIDER_SITE_OTHER): Payer: Medicare Other | Admitting: Urology

## 2022-01-02 ENCOUNTER — Other Ambulatory Visit: Payer: Self-pay | Admitting: Family Medicine

## 2022-01-02 ENCOUNTER — Encounter: Payer: Self-pay | Admitting: Urology

## 2022-01-02 VITALS — BP 142/79 | HR 96 | Ht 64.0 in | Wt 167.0 lb

## 2022-01-02 DIAGNOSIS — N3941 Urge incontinence: Secondary | ICD-10-CM | POA: Diagnosis not present

## 2022-01-02 DIAGNOSIS — N952 Postmenopausal atrophic vaginitis: Secondary | ICD-10-CM | POA: Diagnosis not present

## 2022-01-02 DIAGNOSIS — N3281 Overactive bladder: Secondary | ICD-10-CM

## 2022-01-02 DIAGNOSIS — Z87440 Personal history of urinary (tract) infections: Secondary | ICD-10-CM

## 2022-01-02 LAB — URINALYSIS, ROUTINE W REFLEX MICROSCOPIC
Bilirubin, UA: NEGATIVE
Glucose, UA: NEGATIVE
Ketones, UA: NEGATIVE
Leukocytes,UA: NEGATIVE
Nitrite, UA: NEGATIVE
Protein,UA: NEGATIVE
RBC, UA: NEGATIVE
Specific Gravity, UA: 1.02 (ref 1.005–1.030)
Urobilinogen, Ur: 0.2 mg/dL (ref 0.2–1.0)
pH, UA: 7 (ref 5.0–7.5)

## 2022-01-02 MED ORDER — GEMTESA 75 MG PO TABS: 75.0000 mg | ORAL_TABLET | Freq: Every day | ORAL | 11 refills | Status: DC

## 2022-01-02 NOTE — Progress Notes (Signed)
?Subjective: ? ?1. Overactive bladder   ?2. Urge incontinence   ?3. Vaginal atrophy   ?4. Personal history of urinary infection   ?  ?06/27/21: Tennelle returns today in f/u.  She has a history of OAB wet and prior UTI's with vaginal atrophy.   He was last seen in the [place] office and had previously been on myrbetriq and PTNS with good success.  She had been on topical estrogen as well.  She is no longer on the meds and exhausted her PTNS benefit.   She has had no recent UTI's.   She had a left knee replacement on 06/13/21 and had some increase frequency and urgency for a week or two but that has settled down.   She continues to have urgency with UUI and continues to use pads.  She has no significant SUI.   She has nocturia x 1.  Her UA has 6-10 WBC and a few bacteria.   ? ?01/02/22: Nour returns today in f/u for his history of OAB and UTI's with vaginal atrophy.   She is on no meds from [place] now.   She has had no UTI's.  She continues to use 1ppd.   She had a good response to [place] but it was too expensive.    ?  ? ?ROS: ? ?ROS:  ?A complete review of systems was performed.  All systems are negative except for pertinent findings as noted.  ? ?Review of Systems  ?All other systems reviewed and are negative. ? ?Allergies  ?Allergen Reactions  ? Other Other (See Comments)  ?  Arthritis medications- causes a bacteria in the abdomen  ? ? ?Outpatient Encounter Medications as of 01/02/2022  ?Medication Sig  ? acyclovir (ZOVIRAX) 400 MG tablet Take 400 mg by mouth 3 (three) times daily.  ? Black Pepper-Turmeric (TURMERIC COMPLEX/BLACK PEPPER PO) Take 1 tablet by mouth daily.  ? Calcium-Magnesium-Vitamin D (CALCIUM 1200+D3 PO) Take 1 tablet by mouth daily.  ? Carboxymethylcellul-Glycerin (LUBRICATING EYE DROPS OP) Place 1 drop into both eyes 2 (two) times daily.  ? cyclobenzaprine (FLEXERIL) 10 MG tablet TAKE ONE TABLET ('10MG'$  TOTAL) BY MOUTH THREE TIMES DAILY AS NEEDED FOR MUSCLE SPASMS  ? denosumab (PROLIA) 60 MG/ML  SOSY injection Inject 60 mg into the skin every 6 (six) months.  ? fluticasone (FLONASE) 50 MCG/ACT nasal spray Use 2 sprays in each  nostril once daily (Patient taking differently: Place 2 sprays into both nostrils at bedtime.)  ? levocetirizine (XYZAL) 5 MG tablet Take 5 mg by mouth daily.  ? losartan (COZAAR) 100 MG tablet TAKE ONE TABLET ('100MG'$  TOTAL) BY MOUTH DAILY  ? MELATONIN ER PO Take by mouth.  ? Misc Natural Products (GLUCOSAMINE CHONDROITIN TRIPLE) TABS Take 1 tablet by mouth 2 (two) times daily.  ? Multiple Vitamin (MULTIVITAMIN) tablet Take 1 tablet by mouth daily.  ? Omega-3 Fatty Acids (FISH OIL) 1200 MG CAPS Take 1,200 mg by mouth 2 (two) times daily.   ? pravastatin (PRAVACHOL) 40 MG tablet TAKE ONE (1) TABLET BY MOUTH EVERY DAY  ? rOPINIRole (REQUIP) 2 MG tablet TAKE ONE TABLET BY MOUTH EVERY NIGHT AT BEDTIME  ? traMADol (ULTRAM) 50 MG tablet Take 1 tablet (50 mg total) by mouth in the morning, at noon, and at bedtime.  ? Vibegron (GEMTESA) 75 MG TABS Take 75 mg by mouth daily.  ? [DISCONTINUED] ALPRAZolam (XANAX) 0.5 MG tablet TAKE ONE TABLET BY MOUTH EVERY NIGHT AT BEDTIME  ? [DISCONTINUED] pantoprazole (PROTONIX) 40 MG tablet  TAKE ONE TABLET TWICE DAILY BEFORE MEALS  ? ?Facility-Administered Encounter Medications as of 01/02/2022  ?Medication  ? denosumab (PROLIA) injection 60 mg  ? ? ?Past Medical History:  ?Diagnosis Date  ? Acid reflux   ? Arthritis   ? Depression   ? Family history of adverse reaction to anesthesia   ? Mother woke up and could hearduring surgery  ? Hypercholesterolemia   ? Hypertension   ? Insomnia   ? Osteoporosis   ? Panic attacks   ? panic attacks  ? Reflux   ? Restless leg syndrome   ? ? ?Past Surgical History:  ?Procedure Laterality Date  ? ABDOMINAL HYSTERECTOMY  2001  ? BLADDER SURGERY    ? BREAST BIOPSY  1987  ? BREAST CYST EXCISION  1998  ? CARPAL TUNNEL RELEASE Right 04/06/2015  ? Procedure: CARPAL TUNNEL RELEASE;  Surgeon: Carole Civil, MD;  Location: [facility];   Service: Orthopedics;  Laterality: Right;  ? COLONOSCOPY  09/19/2004  ? [id number] hemorrhoids, otherwise normal rectum/Sigmoid diverticula.  Remainder of colon mucosa appeared normal  ? COLONOSCOPY WITH ESOPHAGOGASTRODUODENOSCOPY (EGD) N/A 06/24/2013  ? [id number] polyp-removed. Small hiatal hernia. Abnormal gastric mucosa-query NSAID effect-s/p (chronic inactive gastritis, negative H. pylori)/TCS: Prominent internal hemorrhoids-likely source of hematochezia. Colonic diverticulosis. Colonic polyp-removed (tubular adenoma)  ? COLONOSCOPY WITH PROPOFOL N/A 11/01/2018  ? Procedure: COLONOSCOPY WITH PROPOFOL;  Surgeon: Daneil Dolin, MD;  [facility];  Service: Endoscopy;  Laterality: N/A;  7:30am  ? CYSTOCELE REPAIR  2010  ? DILATION AND CURETTAGE OF UTERUS    ? JOINT REPLACEMENT N/A   ? [employer] 03/02/2020  ? KNEE SURGERY Right 2003  ? arthroscopic left  ? RECTAL SURGERY    ? RECTOCELE REPAIR  2010  ? REPLACEMENT TOTAL KNEE Right   ? TONSILLECTOMY    ? TOTAL KNEE ARTHROPLASTY Right 05/09/2019  ? Procedure: TOTAL KNEE ARTHROPLASTY;  Surgeon: Vickey Huger, MD;  Location: [facility];  Service: Orthopedics;  Laterality: Right;  ? ? ?Social History  ? ?Socioeconomic History  ? Marital status: Married  ?  Spouse name: Not on file  ? Number of children: Not on file  ? Years of education: Not on file  ? Highest education level: Not on file  ?Occupational History  ? Not on file  ?Tobacco Use  ? Smoking status: Never  ? Smokeless tobacco: Never  ?Vaping Use  ? Vaping Use: Never used  ?Substance and Sexual Activity  ? Alcohol use: No  ? Drug use: No  ? Sexual activity: Yes  ?  Birth control/protection: Surgical  ?Other Topics Concern  ? Not on file  ?Social History Narrative  ? Not on file  ? ?Social Determinants of Health  ? ?Financial Resource Strain: Not on file  ?Food Insecurity: Not on file  ?Transportation Needs: Not on file  ?Physical Activity: Not on file  ?Stress: Not on file  ?Social Connections: Not on  file  ?Intimate Partner Violence: Not on file  ? ? ?Family History  ?Problem Relation Age of Onset  ? Cancer Mother   ?     Breast  ? Hypertension Father   ? Heart attack Father   ? Cancer Sister   ?     Breast  ? Cancer Sister   ?     breast  ? Breast cancer Sister   ? Colon cancer Neg Hx   ? ? ? ? ? ?Objective: ?Vitals:  ? 01/02/22 0926  ?BP: (!) 142/79  ?  Pulse: 96  ? ? ? ?Physical Exam ? ?Lab Results:  ?Recent Results (from the past 2160 hour(s))  ?Urinalysis, Routine w reflex microscopic     Status: None  ? Collection Time: 01/02/22  9:34 AM  ?Result Value Ref Range  ? Specific Gravity, UA 1.020 1.005 - 1.030  ? pH, UA 7.0 5.0 - 7.5  ? Color, UA Yellow Yellow  ? Appearance Ur Clear Clear  ? Leukocytes,UA Negative Negative  ? Protein,UA Negative Negative/Trace  ? Glucose, UA Negative Negative  ? Ketones, UA Negative Negative  ? RBC, UA Negative Negative  ? Bilirubin, UA Negative Negative  ? Urobilinogen, Ur 0.2 0.2 - 1.0 mg/dL  ? Nitrite, UA Negative Negative  ? Microscopic Examination Comment   ?  Comment: Microscopic follows if indicated.  ? ?UA is clear.  ? ? ?Studies/Results: ?No results found. ? ? ? ? ?Assessment & Plan: ?OAB wet.   I have given her additional Gemtesa samples and sent a script to [facility] to see if we can get it approved. I will have her return in 6 months.    ? ?Vaginal atrophy with history of UTI's.  She has done well without recurrence despite stopping the estrogen.    ?  ? ?Meds ordered this encounter  ?Medications  ? Vibegron (GEMTESA) 75 MG TABS  ?  Sig: Take 75 mg by mouth daily.  ?  Dispense:  30 tablet  ?  Refill:  11  ? ?  ? ?Orders Placed This Encounter  ?Procedures  ? Urinalysis, Routine w reflex microscopic  ?  ? ? ?Return in about 6 months (around 07/04/2022). ? ? ?CC: Susy Frizzle, MD   ? ? ? ?Irine Seal ?01/02/2022 ? ?Patient ID: Allison Freeman, female   DOB: Nov 09, 1945, 76 y.o.   MRN: [id number] ? ?

## 2022-01-02 NOTE — Telephone Encounter (Signed)
LOV 03/07/21 ?Last refill 12/03/21, #30, 0 refills ? ?Please review, thanks! ? ?

## 2022-01-07 ENCOUNTER — Other Ambulatory Visit: Payer: Self-pay | Admitting: Family Medicine

## 2022-01-14 ENCOUNTER — Telehealth: Payer: Self-pay

## 2022-01-14 NOTE — Telephone Encounter (Signed)
Appeal letter sent today via fax [phone] for [place] denial  ?

## 2022-01-14 NOTE — Telephone Encounter (Signed)
Patient calling about forms to be filled out regarding help with the cost of Vibegron (GEMTESA) 75 MG TABS. ? ?Wanting to know if we received the forms and if they have been filled out. ? ?Please advise. ? ?Thanks, ?Allison Freeman ?

## 2022-01-14 NOTE — Telephone Encounter (Signed)
Left message for patient of appeal letter sent to insurance company.  ?

## 2022-01-29 DIAGNOSIS — Z1283 Encounter for screening for malignant neoplasm of skin: Secondary | ICD-10-CM | POA: Diagnosis not present

## 2022-01-29 DIAGNOSIS — B009 Herpesviral infection, unspecified: Secondary | ICD-10-CM | POA: Diagnosis not present

## 2022-02-03 ENCOUNTER — Other Ambulatory Visit: Payer: Self-pay | Admitting: Family Medicine

## 2022-02-04 NOTE — Telephone Encounter (Signed)
Requested medication (s) are due for refill today: yes ? ?Requested medication (s) are on the active medication list: yes ? ?Last refill:  01/07/22 #30/0 ? ?Future visit scheduled: yes ? ?Notes to clinic:  Unable to refill per protocol, cannot delegate. ? ?  ?Requested Prescriptions  ?Pending Prescriptions Disp Refills  ? cyclobenzaprine (FLEXERIL) 10 MG tablet [Pharmacy Med Name: CYCLOBENZAPRINE HCL 10 MG TAB] 30 tablet 0  ?  Sig: TAKE ONE TABLET ('10MG'$  TOTAL) BY MOUTH THREE TIMES DAILY AS NEEDED FOR MUSCLE SPASMS  ?  ? Not Delegated - Analgesics:  Muscle Relaxants Failed - 02/03/2022 12:57 PM  ?  ?  Failed - This refill cannot be delegated  ?  ?  Passed - Valid encounter within last 6 months  ?  Recent Outpatient Visits   ? ?      ? 5 months ago Upper respiratory tract infection, unspecified type  ? [place], NP  ? 11 months ago Encounter for screening mammogram for malignant neoplasm of breast  ? [place] Susy Frizzle, MD  ? 1 year ago Other osteoporosis, unspecified pathological fracture presence  ? [facility] Pickard, Cammie Mcgee, MD  ? 1 year ago Acute non-recurrent frontal sinusitis  ? [facility] Pickard, Cammie Mcgee, MD  ? 1 year ago Right low back pain, unspecified chronicity, unspecified whether sciatica present  ? [facility] Pickard, Cammie Mcgee, MD  ? ?  ?  ? ? ?  ?  ?  ? ?

## 2022-02-05 NOTE — Telephone Encounter (Signed)
LOV 08/13/21 ?Last refill 01/07/22, #30, 0 refills ? ?Please review, thanks! ? ? ?

## 2022-02-11 ENCOUNTER — Other Ambulatory Visit: Payer: Self-pay | Admitting: Family Medicine

## 2022-02-12 NOTE — Telephone Encounter (Signed)
Requested Prescriptions  Pending Prescriptions Disp Refills  . rOPINIRole (REQUIP) 2 MG tablet [Pharmacy Med Name: ROPINIROLE HCL 2 MG TAB] 90 tablet 1    Sig: TAKE ONE TABLET BY MOUTH EVERY NIGHT AT BEDTIME     Neurology:  Parkinsonian Agents Failed - 02/11/2022  2:59 PM      Failed - Last BP in normal range    BP Readings from Last 1 Encounters:  01/02/22 (!) 142/79         Passed - Last Heart Rate in normal range    Pulse Readings from Last 1 Encounters:  01/02/22 96         Passed - Valid encounter within last 12 months    Recent Outpatient Visits          6 months ago Upper respiratory tract infection, unspecified type   [place] Eulogio Bear, NP   11 months ago Encounter for screening mammogram for malignant neoplasm of breast   [place] Pickard, Cammie Mcgee, MD   1 year ago Other osteoporosis, unspecified pathological fracture presence   [place] Pickard, Cammie Mcgee, MD   1 year ago Acute non-recurrent frontal sinusitis   [place] Susy Frizzle, MD   1 year ago Right low back pain, unspecified chronicity, unspecified whether sciatica present   [place] Pickard, Cammie Mcgee, MD

## 2022-03-06 ENCOUNTER — Other Ambulatory Visit: Payer: Self-pay | Admitting: Family Medicine

## 2022-03-10 ENCOUNTER — Encounter: Payer: Medicare Other | Admitting: Family Medicine

## 2022-03-14 ENCOUNTER — Other Ambulatory Visit: Payer: Self-pay | Admitting: Family Medicine

## 2022-03-17 ENCOUNTER — Encounter: Payer: Self-pay | Admitting: Family Medicine

## 2022-03-17 ENCOUNTER — Ambulatory Visit (INDEPENDENT_AMBULATORY_CARE_PROVIDER_SITE_OTHER): Payer: Medicare Other | Admitting: Family Medicine

## 2022-03-17 VITALS — HR 86 | Temp 98.2°F | Ht 64.0 in | Wt 171.0 lb

## 2022-03-17 DIAGNOSIS — M810 Age-related osteoporosis without current pathological fracture: Secondary | ICD-10-CM

## 2022-03-17 DIAGNOSIS — I10 Essential (primary) hypertension: Secondary | ICD-10-CM | POA: Diagnosis not present

## 2022-03-17 DIAGNOSIS — Z1231 Encounter for screening mammogram for malignant neoplasm of breast: Secondary | ICD-10-CM

## 2022-03-17 DIAGNOSIS — E7800 Pure hypercholesterolemia, unspecified: Secondary | ICD-10-CM

## 2022-03-17 DIAGNOSIS — Z0000 Encounter for general adult medical examination without abnormal findings: Secondary | ICD-10-CM | POA: Diagnosis not present

## 2022-03-17 MED ORDER — TRAMADOL HCL 50 MG PO TABS: 50.0000 mg | ORAL_TABLET | Freq: Three times a day (TID) | ORAL | 0 refills | Status: DC

## 2022-03-18 ENCOUNTER — Other Ambulatory Visit: Payer: Self-pay | Admitting: Family Medicine

## 2022-03-18 LAB — LIPID PANEL
Cholesterol: 162 mg/dL (ref <200)
HDL: 57 mg/dL (ref >=50)
LDL Cholesterol (Calc): 88 mg/dL (calc)
Non-HDL Cholesterol (Calc): 105 mg/dL (calc) (ref <130)
Total CHOL/HDL Ratio: 2.8 (calc) (ref <5.0)
Triglycerides: 83 mg/dL (ref <150)

## 2022-03-18 LAB — CBC WITH DIFFERENTIAL/PLATELET
Absolute Monocytes: 446 cells/uL (ref 200–950)
Basophils Absolute: 33 cells/uL (ref 0–200)
Basophils Relative: 0.6 %
Eosinophils Absolute: 110 cells/uL (ref 15–500)
Eosinophils Relative: 2 %
HCT: 38.9 % (ref 35.0–45.0)
Hemoglobin: 12.7 g/dL (ref 11.7–15.5)
Lymphs Abs: 1925 cells/uL (ref 850–3900)
MCH: 30.1 pg (ref 27.0–33.0)
MCHC: 32.6 g/dL (ref 32.0–36.0)
MCV: 92.2 fL (ref 80.0–100.0)
MPV: 10.8 fL (ref 7.5–12.5)
Monocytes Relative: 8.1 %
Neutro Abs: 2987 cells/uL (ref 1500–7800)
Neutrophils Relative %: 54.3 %
Platelets: 243 10*3/uL (ref 140–400)
RBC: 4.22 10*6/uL (ref 3.80–5.10)
RDW: 12.6 % (ref 11.0–15.0)
Total Lymphocyte: 35 %
WBC: 5.5 10*3/uL (ref 3.8–10.8)

## 2022-03-18 LAB — COMPLETE METABOLIC PANEL WITH GFR
AG Ratio: 1.7 (calc) (ref 1.0–2.5)
ALT: 16 U/L (ref 6–29)
AST: 22 U/L (ref 10–35)
Albumin: 4 g/dL (ref 3.6–5.1)
Alkaline phosphatase (APISO): 51 U/L (ref 37–153)
BUN/Creatinine Ratio: 20 (calc) (ref 6–22)
BUN: 23 mg/dL (ref 7–25)
CO2: 28 mmol/L (ref 20–32)
Calcium: 9.6 mg/dL (ref 8.6–10.4)
Chloride: 106 mmol/L (ref 98–110)
Creat: 1.13 mg/dL — ABNORMAL HIGH (ref 0.60–1.00)
Globulin: 2.4 g/dL (calc) (ref 1.9–3.7)
Glucose, Bld: 102 mg/dL — ABNORMAL HIGH (ref 65–99)
Potassium: 4.3 mmol/L (ref 3.5–5.3)
Sodium: 141 mmol/L (ref 135–146)
Total Bilirubin: 1.3 mg/dL — ABNORMAL HIGH (ref 0.2–1.2)
Total Protein: 6.4 g/dL (ref 6.1–8.1)
eGFR: 51 mL/min/{1.73_m2} — ABNORMAL LOW (ref >=60)

## 2022-03-19 ENCOUNTER — Other Ambulatory Visit: Payer: Self-pay

## 2022-03-19 NOTE — Telephone Encounter (Signed)
LOV 02/2722 Last refill 02/06/22, #30, 0 refills  Please review, thanks!

## 2022-03-20 MED ORDER — CYCLOBENZAPRINE HCL 10 MG PO TABS
10.0000 mg | ORAL_TABLET | Freq: Three times a day (TID) | ORAL | 0 refills | Status: DC | PRN
Start: 2022-03-20 — End: 2022-04-16

## 2022-04-15 ENCOUNTER — Other Ambulatory Visit (HOSPITAL_COMMUNITY): Payer: Self-pay | Admitting: Family Medicine

## 2022-04-15 ENCOUNTER — Other Ambulatory Visit: Payer: Self-pay | Admitting: Family Medicine

## 2022-04-15 DIAGNOSIS — Z1231 Encounter for screening mammogram for malignant neoplasm of breast: Secondary | ICD-10-CM

## 2022-04-16 ENCOUNTER — Other Ambulatory Visit: Payer: Self-pay

## 2022-04-16 MED ORDER — CYCLOBENZAPRINE HCL 10 MG PO TABS: 10.0000 mg | ORAL_TABLET | Freq: Three times a day (TID) | ORAL | 0 refills | Status: DC | PRN

## 2022-04-16 NOTE — Telephone Encounter (Signed)
LOV 03/17/22 Last refill 01/02/22, #30, 2 refills  Please review, thanks!

## 2022-04-17 MED ORDER — ALPRAZOLAM 0.5 MG PO TABS: 0.5000 mg | ORAL_TABLET | Freq: Every day | ORAL | 2 refills | Status: DC

## 2022-04-23 ENCOUNTER — Ambulatory Visit
Admission: EM | Admit: 2022-04-23 | Discharge: 2022-04-23 | Disposition: A | Discharge status: Home / Self Care | Payer: Medicare Other | Attending: Family Medicine | Admitting: Family Medicine

## 2022-04-23 DIAGNOSIS — N390 Urinary tract infection, site not specified: Secondary | ICD-10-CM | POA: Diagnosis not present

## 2022-04-23 DIAGNOSIS — J0100 Acute maxillary sinusitis, unspecified: Secondary | ICD-10-CM | POA: Diagnosis not present

## 2022-04-23 LAB — POCT URINALYSIS DIP (MANUAL ENTRY)
Bilirubin, UA: NEGATIVE
Blood, UA: NEGATIVE
Glucose, UA: NEGATIVE mg/dL
Nitrite, UA: NEGATIVE
Protein Ur, POC: NEGATIVE mg/dL
Spec Grav, UA: 1.02 (ref 1.010–1.025)
Urobilinogen, UA: 0.2 E.U./dL
pH, UA: 6 (ref 5.0–8.0)

## 2022-04-23 MED ORDER — CEPHALEXIN 500 MG PO CAPS: 500.0000 mg | ORAL_CAPSULE | Freq: Two times a day (BID) | ORAL | 0 refills | Status: DC

## 2022-04-23 NOTE — ED Provider Notes (Signed)
[facility]    CSN: [id number] Arrival date & time: 04/23/22  1240      History   Chief Complaint Chief Complaint  Patient presents with   Dysuria   Abdominal Pain   Nasal Congestion    HPI KATHLENE YANO is a 76 y.o. female.   Presenting today with about a week of dysuria, urinary frequency, lower abdominal pressure as well as upper respiratory symptoms to include nasal congestion, sinus pain and pressure, productive cough.  Denies fever, chills, nausea, vomiting, chest pain, shortness of breath.  Not trying any medications over-the-counter for symptoms.  Does have a history of sinus infections and urinary tract infections in the past.  History of seasonal allergies on antihistamines daily.    Past Medical History:  Diagnosis Date   Acid reflux    Arthritis    Depression    Family history of adverse reaction to anesthesia    Mother woke up and could hearduring surgery   Hypercholesterolemia    Hypertension    Insomnia    Osteoporosis    Panic attacks    panic attacks   Reflux    Restless leg syndrome     Patient Active Problem List   Diagnosis Date Noted   S/P total knee replacement 05/09/2019   Osteoporosis    History of colonic polyps 08/31/2018   Carpal tunnel syndrome of right wrist    Osteoarthritis 01/21/2014   Abdominal pain, epigastric 06/16/2013   Rectal bleeding 06/16/2013   Hemorrhoids 06/16/2013   Other and unspecified hyperlipidemia 05/16/2013   Allergic rhinitis 05/16/2013   Esophageal reflux 04/15/2013   Difficulty in walking(719.7) 12/20/2012   Bilateral leg weakness 12/20/2012   Hip bursitis 12/14/2012   Back pain 12/14/2012   Hip pain, bilateral 12/14/2012    Past Surgical History:  Procedure Laterality Date   ABDOMINAL HYSTERECTOMY  2001   BLADDER SURGERY     BREAST BIOPSY  1987   BREAST CYST EXCISION  1998   CARPAL TUNNEL RELEASE Right 04/06/2015   Procedure: CARPAL TUNNEL RELEASE;  Surgeon: Carole Civil,  MD;  Location: [facility];  Service: Orthopedics;  Laterality: Right;   COLONOSCOPY  09/19/2004   [id number] hemorrhoids, otherwise normal rectum/Sigmoid diverticula.  Remainder of colon mucosa appeared normal   COLONOSCOPY WITH ESOPHAGOGASTRODUODENOSCOPY (EGD) N/A 06/24/2013   OXB:[id number] polyp-removed. Small hiatal hernia. Abnormal gastric mucosa-query NSAID effect-s/p (chronic inactive gastritis, negative H. pylori)/TCS: Prominent internal hemorrhoids-likely source of hematochezia. Colonic diverticulosis. Colonic polyp-removed (tubular adenoma)   COLONOSCOPY WITH PROPOFOL N/A 11/01/2018   Procedure: COLONOSCOPY WITH PROPOFOL;  Surgeon: Daneil Dolin, MD;  [facility];  Service: Endoscopy;  Laterality: N/A;  7:30am   CYSTOCELE REPAIR  2010   DILATION AND CURETTAGE OF UTERUS     JOINT REPLACEMENT N/A    [employer] 03/02/2020   KNEE SURGERY Right 2003   arthroscopic left   RECTAL SURGERY     RECTOCELE REPAIR  2010   REPLACEMENT TOTAL KNEE Right    TONSILLECTOMY     TOTAL KNEE ARTHROPLASTY Right 05/09/2019   Procedure: TOTAL KNEE ARTHROPLASTY;  Surgeon: Vickey Huger, MD;  Location: [facility];  Service: Orthopedics;  Laterality: Right;    OB History   No obstetric history on file.      Home Medications    Prior to Admission medications   Medication Sig Start Date End Date Taking? Authorizing Provider  cephALEXin (KEFLEX) 500 MG capsule Take 1 capsule (500 mg total) by mouth 2 (  two) times daily. 04/23/22  Yes Volney American, PA-C  acyclovir (ZOVIRAX) 400 MG tablet Take 400 mg by mouth 3 (three) times daily. 12/30/19   [provider]  ALPRAZolam Duanne Moron) 0.5 MG tablet Take 1 tablet (0.5 mg total) by mouth at bedtime. 04/17/22   Susy Frizzle, MD  Black Pepper-Turmeric (TURMERIC COMPLEX/BLACK PEPPER PO) Take 1 tablet by mouth daily.    [provider]  Calcium-Magnesium-Vitamin D (CALCIUM 1200+D3 PO) Take 1 tablet by mouth daily.    [provider]   Carboxymethylcellul-Glycerin (LUBRICATING EYE DROPS OP) Place 1 drop into both eyes 2 (two) times daily.    [provider]  cyclobenzaprine (FLEXERIL) 10 MG tablet Take 1 tablet (10 mg total) by mouth 3 (three) times daily as needed for muscle spasms. 04/16/22   Susy Frizzle, MD  denosumab (PROLIA) 60 MG/ML SOSY injection Inject 60 mg into the skin every 6 (six) months.    [provider]  fluticasone (FLONASE) 50 MCG/ACT nasal spray Use 2 sprays in each  nostril once daily Patient taking differently: Place 2 sprays into both nostrils at bedtime. 05/30/15   Mikey Kirschner, MD  levocetirizine (XYZAL) 5 MG tablet Take 5 mg by mouth daily.    [provider]  losartan (COZAAR) 100 MG tablet TAKE ONE TABLET ('100MG'$  TOTAL) BY MOUTH DAILY 08/21/21   Susy Frizzle, MD  MELATONIN ER PO Take by mouth.    [provider]  Misc Natural Products (GLUCOSAMINE CHONDROITIN TRIPLE) TABS Take 1 tablet by mouth 2 (two) times daily.    [provider]  Multiple Vitamin (MULTIVITAMIN) tablet Take 1 tablet by mouth daily.    [provider]  Omega-3 Fatty Acids (FISH OIL) 1200 MG CAPS Take 1,200 mg by mouth 2 (two) times daily.     [provider]  pantoprazole (PROTONIX) 40 MG tablet TAKE ONE TABLET TWICE DAILY BEFORE MEALS 01/02/22   Susy Frizzle, MD  pravastatin (PRAVACHOL) 40 MG tablet TAKE ONE (1) TABLET BY MOUTH EVERY DAY 01/07/22   Susy Frizzle, MD  rOPINIRole (REQUIP) 2 MG tablet TAKE ONE TABLET BY MOUTH EVERY NIGHT AT BEDTIME 02/12/22   Susy Frizzle, MD  traMADol (ULTRAM) 50 MG tablet Take 1 tablet (50 mg total) by mouth in the morning, at noon, and at bedtime. 03/17/22   Susy Frizzle, MD  Vibegron (GEMTESA) 75 MG TABS Take 75 mg by mouth daily. 01/02/22   Irine Seal, MD    Family History Family History  Problem Relation Age of Onset   Cancer Mother        Breast   Hypertension Father    Heart attack Father     Cancer Sister        Breast   Cancer Sister        breast   Breast cancer Sister    Colon cancer Neg Hx     Social History Social History   Tobacco Use   Smoking status: Never   Smokeless tobacco: Never  Vaping Use   Vaping Use: Never used  Substance Use Topics   Alcohol use: No   Drug use: No     Allergies   Other   Review of Systems Review of Systems Per HPI  Physical Exam Triage Vital Signs ED Triage Vitals  Enc Vitals Group     BP 04/23/22 1251 (!) 154/87     Pulse Rate 04/23/22 1251 98     Resp  04/23/22 1251 18     Temp 04/23/22 1251 97.9 F (36.6 C)     Temp Source 04/23/22 1251 Oral     SpO2 04/23/22 1251 98 %     Weight --      Height --      Head Circumference --      Peak Flow --      Pain Score 04/23/22 1250 4     Pain Loc --      Pain Edu? --      Excl. in [place]? --    No data found.  Updated Vital Signs BP (!) 154/87 (BP Location: Right Arm)   Pulse 98   Temp 97.9 F (36.6 C) (Oral)   Resp 18   SpO2 98%   Visual Acuity Right Eye Distance:   Left Eye Distance:   Bilateral Distance:    Right Eye Near:   Left Eye Near:    Bilateral Near:     Physical Exam Vitals and nursing note reviewed.  Constitutional:      Appearance: Normal appearance. She is not ill-appearing.  HENT:     Head: Atraumatic.     Right Ear: Tympanic membrane and external ear normal.     Left Ear: Tympanic membrane and external ear normal.     Nose: Congestion present.     Mouth/Throat:     Mouth: Mucous membranes are moist.     Pharynx: Posterior oropharyngeal erythema present.  Eyes:     Extraocular Movements: Extraocular movements intact.     Conjunctiva/sclera: Conjunctivae normal.  Cardiovascular:     Rate and Rhythm: Normal rate and regular rhythm.     Heart sounds: Normal heart sounds.  Pulmonary:     Effort: Pulmonary effort is normal.     Breath sounds: Normal breath sounds. No wheezing.  Abdominal:     General: Bowel sounds are normal. There  is no distension.     Palpations: Abdomen is soft.     Tenderness: There is no abdominal tenderness. There is no right CVA tenderness, left CVA tenderness or guarding.  Musculoskeletal:        General: Normal range of motion.     Cervical back: Normal range of motion and neck supple.  Skin:    General: Skin is warm and dry.  Neurological:     Mental Status: She is alert and oriented to person, place, and time.     Motor: No weakness.     Gait: Gait normal.  Psychiatric:        Mood and Affect: Mood normal.        Thought Content: Thought content normal.        Judgment: Judgment normal.      UC Treatments / Results  Labs (all labs ordered are listed, but only abnormal results are displayed) Labs Reviewed  POCT URINALYSIS DIP (MANUAL ENTRY) - Abnormal; Notable for the following components:      Result Value   Ketones, POC UA trace (5) (*)    Leukocytes, UA Small (1+) (*)    All other components within normal limits  URINE CULTURE    EKG   Radiology No results found.  Procedures Procedures (including critical care time)  Medications Ordered in UC Medications - No data to display  Initial Impression / Assessment and Plan / UC Course  I have reviewed the triage vital signs and the nursing notes.  Pertinent labs & imaging results that were available during my care of  the patient were reviewed by me and considered in my medical decision making (see chart for details).     Vital signs benign and reassuring, urinalysis today with leukocytes present.  Urine culture pending, will treat with Keflex while awaiting these results.  This would also cover for any bacterial sinusitis.  Discussed Mucinex, nasal sprays, continue antihistamines, sinus rinses and other supportive home care.  Return for worsening symptoms.  Final Clinical Impressions(s) / UC Diagnoses   Final diagnoses:  Acute non-recurrent maxillary sinusitis  Acute lower UTI   Discharge Instructions   None     ED Prescriptions     Medication Sig Dispense Auth. Provider   cephALEXin (KEFLEX) 500 MG capsule Take 1 capsule (500 mg total) by mouth 2 (two) times daily. 14 capsule Volney American, [place]      PDMP not reviewed this encounter.   Volney American, [place] 04/23/22 1342

## 2022-04-23 NOTE — ED Triage Notes (Signed)
Pt reports lower abdominal pain and burning when urinating x 1 week.  Pt reports nasal congestion, facial pressure and ear pain for over a week.

## 2022-04-24 LAB — URINE CULTURE: Culture: <10000 — AB

## 2022-04-29 ENCOUNTER — Ambulatory Visit: Payer: Medicare Other | Admitting: Family Medicine

## 2022-05-09 ENCOUNTER — Ambulatory Visit (HOSPITAL_COMMUNITY)
Admission: RE | Admit: 2022-05-09 | Discharge: 2022-05-09 | Disposition: A | Discharge status: Home / Self Care | Payer: Medicare Other | Source: Ambulatory Visit | Attending: Family Medicine | Admitting: Family Medicine

## 2022-05-09 DIAGNOSIS — Z1231 Encounter for screening mammogram for malignant neoplasm of breast: Secondary | ICD-10-CM | POA: Insufficient documentation

## 2022-05-13 DIAGNOSIS — Z96652 Presence of left artificial knee joint: Secondary | ICD-10-CM | POA: Diagnosis not present

## 2022-05-16 ENCOUNTER — Other Ambulatory Visit: Payer: Self-pay | Admitting: Family Medicine

## 2022-05-16 NOTE — Telephone Encounter (Signed)
Requested medication (s) are due for refill today: yes  Requested medication (s) are on the active medication list: yes  Last refill:  04/16/22 #30/0  Future visit scheduled: no  Notes to clinic:  Unable to refill per protocol, cannot delegate.    Requested Prescriptions  Pending Prescriptions Disp Refills   cyclobenzaprine (FLEXERIL) 10 MG tablet [Pharmacy Med Name: CYCLOBENZAPRINE HCL 10 MG TAB] 30 tablet 0    Sig: TAKE ONE TABLET ('10MG'$  TOTAL) BY MOUTH THREE TIMES DAILY AS NEEDED FOR MUSCLE SPASMS     Not Delegated - Analgesics:  Muscle Relaxants Failed - 05/16/2022  5:03 PM      Failed - This refill cannot be delegated      Failed - Valid encounter within last 6 months    Recent Outpatient Visits           9 months ago Upper respiratory tract infection, unspecified type   [facility] Eulogio Bear, NP   1 year ago Encounter for screening mammogram for malignant neoplasm of breast   [place] Pickard, Cammie Mcgee, MD   1 year ago Other osteoporosis, unspecified pathological fracture presence   [place] Pickard, Cammie Mcgee, MD   1 year ago Acute non-recurrent frontal sinusitis   [place], Warren T, MD   2 years ago Right low back pain, unspecified chronicity, unspecified whether sciatica present   [place] Pickard, Cammie Mcgee, MD

## 2022-05-21 DIAGNOSIS — G5602 Carpal tunnel syndrome, left upper limb: Secondary | ICD-10-CM | POA: Diagnosis not present

## 2022-05-21 DIAGNOSIS — M65312 Trigger thumb, left thumb: Secondary | ICD-10-CM | POA: Diagnosis not present

## 2022-06-05 DIAGNOSIS — G5602 Carpal tunnel syndrome, left upper limb: Secondary | ICD-10-CM | POA: Diagnosis not present

## 2022-06-06 ENCOUNTER — Other Ambulatory Visit: Payer: Self-pay | Admitting: Orthopedic Surgery

## 2022-06-06 DIAGNOSIS — G5602 Carpal tunnel syndrome, left upper limb: Secondary | ICD-10-CM | POA: Diagnosis not present

## 2022-06-19 ENCOUNTER — Other Ambulatory Visit: Payer: Self-pay | Admitting: Family Medicine

## 2022-06-19 NOTE — Telephone Encounter (Signed)
Requested medication (s) are due for refill today: yes  Requested medication (s) are on the active medication list: yes    Last refill: 05/19/22  #30  0 refills  Future visit scheduled No  Notes to clinic:Not delegated, please review.  Requested Prescriptions  Pending Prescriptions Disp Refills   cyclobenzaprine (FLEXERIL) 10 MG tablet [Pharmacy Med Name: CYCLOBENZAPRINE HCL 10 MG TAB] 30 tablet 0    Sig: TAKE ONE TABLET ('10MG'$  TOTAL) BY MOUTH THREE TIMES DAILY AS NEEDED FOR MUSCLE SPASMS     Not Delegated - Analgesics:  Muscle Relaxants Failed - 06/19/2022 10:07 AM      Failed - This refill cannot be delegated      Failed - Valid encounter within last 6 months    Recent Outpatient Visits           10 months ago Upper respiratory tract infection, unspecified type   [facility] Eulogio Bear, NP   1 year ago Encounter for screening mammogram for malignant neoplasm of breast   [place] Pickard, Cammie Mcgee, MD   1 year ago Other osteoporosis, unspecified pathological fracture presence   [place] Pickard, Cammie Mcgee, MD   1 year ago Acute non-recurrent frontal sinusitis   [place] Susy Frizzle, MD   2 years ago Right low back pain, unspecified chronicity, unspecified whether sciatica present   [place] Pickard, Cammie Mcgee, MD       Future Appointments             In 1 month Irine Seal, MD [place]

## 2022-06-21 ENCOUNTER — Other Ambulatory Visit: Payer: Self-pay | Admitting: Family Medicine

## 2022-06-23 ENCOUNTER — Other Ambulatory Visit: Payer: Self-pay | Admitting: Family Medicine

## 2022-06-23 ENCOUNTER — Encounter (HOSPITAL_BASED_OUTPATIENT_CLINIC_OR_DEPARTMENT_OTHER): Payer: Self-pay | Admitting: Orthopedic Surgery

## 2022-06-24 NOTE — Telephone Encounter (Signed)
Requested medication (s) are due for refill today - yes  Requested medication (s) are on the active medication list -yes  Future visit scheduled -no  Last refill: 03/17/22 #90  Notes to clinic: non delegated Rx  Requested Prescriptions  Pending Prescriptions Disp Refills   traMADol (ULTRAM) 50 MG tablet [Pharmacy Med Name: TRAMADOL HCL 50 MG TAB] 90 tablet     Sig: TAKE ONE TABLET ('50MG'$  TOTAL) BY MOUTH INTHE MORNING, AT NOON, AND AT BEDTIME     Not Delegated - Analgesics:  Opioid Agonists Failed - 06/23/2022  2:01 PM      Failed - This refill cannot be delegated      Failed - Urine Drug Screen completed in last 360 days      Failed - Valid encounter within last 3 months    Recent Outpatient Visits           10 months ago Upper respiratory tract infection, unspecified type   [facility] Eulogio Bear, NP   1 year ago Encounter for screening mammogram for malignant neoplasm of breast   [place] Pickard, Cammie Mcgee, MD   1 year ago Other osteoporosis, unspecified pathological fracture presence   [place] Pickard, Cammie Mcgee, MD   1 year ago Acute non-recurrent frontal sinusitis   [place] Susy Frizzle, MD   2 years ago Right low back pain, unspecified chronicity, unspecified whether sciatica present   [place] Pickard, Cammie Mcgee, MD       Future Appointments             In 1 month Irine Seal, MD [place] [place]               Requested Prescriptions  Pending Prescriptions Disp Refills   traMADol (ULTRAM) 50 MG tablet [Pharmacy Med Name: TRAMADOL HCL 50 MG TAB] 90 tablet     Sig: TAKE ONE TABLET ('50MG'$  TOTAL) BY MOUTH INTHE MORNING, AT NOON, AND AT BEDTIME     Not Delegated - Analgesics:  Opioid Agonists Failed - 06/23/2022  2:01 PM      Failed - This refill cannot be delegated      Failed - Urine Drug Screen completed in last 360 days      Failed  - Valid encounter within last 3 months    Recent Outpatient Visits           10 months ago Upper respiratory tract infection, unspecified type   [facility] Eulogio Bear, NP   1 year ago Encounter for screening mammogram for malignant neoplasm of breast   [place] Pickard, Cammie Mcgee, MD   1 year ago Other osteoporosis, unspecified pathological fracture presence   [place] Pickard, Cammie Mcgee, MD   1 year ago Acute non-recurrent frontal sinusitis   [place] Susy Frizzle, MD   2 years ago Right low back pain, unspecified chronicity, unspecified whether sciatica present   [facility], Cammie Mcgee, MD       Future Appointments             In 1 month Irine Seal, MD [place] [facility] [place]

## 2022-06-25 ENCOUNTER — Encounter (HOSPITAL_BASED_OUTPATIENT_CLINIC_OR_DEPARTMENT_OTHER)
Admission: RE | Admit: 2022-06-25 | Discharge: 2022-06-25 | Disposition: A | Discharge status: Home / Self Care | Payer: Medicare Other | Source: Ambulatory Visit | Attending: Orthopedic Surgery | Admitting: Orthopedic Surgery

## 2022-06-25 DIAGNOSIS — Z01810 Encounter for preprocedural cardiovascular examination: Secondary | ICD-10-CM | POA: Diagnosis not present

## 2022-06-25 NOTE — Progress Notes (Signed)

## 2022-06-30 ENCOUNTER — Ambulatory Visit (HOSPITAL_BASED_OUTPATIENT_CLINIC_OR_DEPARTMENT_OTHER): Payer: Medicare Other | Admitting: Anesthesiology

## 2022-06-30 ENCOUNTER — Other Ambulatory Visit: Payer: Self-pay

## 2022-06-30 ENCOUNTER — Encounter (HOSPITAL_BASED_OUTPATIENT_CLINIC_OR_DEPARTMENT_OTHER)
Admission: RE | Disposition: A | Discharge status: Home / Self Care | Payer: Self-pay | Source: Home / Self Care | Attending: Orthopedic Surgery

## 2022-06-30 ENCOUNTER — Encounter (HOSPITAL_BASED_OUTPATIENT_CLINIC_OR_DEPARTMENT_OTHER): Payer: Self-pay | Admitting: Orthopedic Surgery

## 2022-06-30 ENCOUNTER — Ambulatory Visit (HOSPITAL_BASED_OUTPATIENT_CLINIC_OR_DEPARTMENT_OTHER)
Admission: RE | Admit: 2022-06-30 | Discharge: 2022-06-30 | Disposition: A | Discharge status: Home / Self Care | Payer: Medicare Other | Attending: Orthopedic Surgery | Admitting: Orthopedic Surgery

## 2022-06-30 DIAGNOSIS — F419 Anxiety disorder, unspecified: Secondary | ICD-10-CM | POA: Diagnosis not present

## 2022-06-30 DIAGNOSIS — I10 Essential (primary) hypertension: Secondary | ICD-10-CM

## 2022-06-30 DIAGNOSIS — G5602 Carpal tunnel syndrome, left upper limb: Secondary | ICD-10-CM | POA: Diagnosis not present

## 2022-06-30 DIAGNOSIS — M1990 Unspecified osteoarthritis, unspecified site: Secondary | ICD-10-CM

## 2022-06-30 DIAGNOSIS — Z791 Long term (current) use of non-steroidal anti-inflammatories (NSAID): Secondary | ICD-10-CM | POA: Insufficient documentation

## 2022-06-30 DIAGNOSIS — F32A Depression, unspecified: Secondary | ICD-10-CM | POA: Insufficient documentation

## 2022-06-30 DIAGNOSIS — K219 Gastro-esophageal reflux disease without esophagitis: Secondary | ICD-10-CM | POA: Insufficient documentation

## 2022-06-30 DIAGNOSIS — G2581 Restless legs syndrome: Secondary | ICD-10-CM | POA: Diagnosis not present

## 2022-06-30 DIAGNOSIS — Z01818 Encounter for other preprocedural examination: Secondary | ICD-10-CM

## 2022-06-30 DIAGNOSIS — E7800 Pure hypercholesterolemia, unspecified: Secondary | ICD-10-CM | POA: Diagnosis not present

## 2022-06-30 DIAGNOSIS — F418 Other specified anxiety disorders: Secondary | ICD-10-CM | POA: Diagnosis not present

## 2022-06-30 DIAGNOSIS — Z79899 Other long term (current) drug therapy: Secondary | ICD-10-CM | POA: Insufficient documentation

## 2022-06-30 HISTORY — PX: CARPAL TUNNEL RELEASE: SHX101

## 2022-06-30 SURGERY — CARPAL TUNNEL RELEASE
Anesthesia: Monitor Anesthesia Care | Site: Hand | Laterality: Left

## 2022-06-30 MED ORDER — MIDAZOLAM HCL 5 MG/5ML IJ SOLN
INTRAMUSCULAR | Status: DC | PRN
  Administered 2022-06-30: 2 mg via INTRAVENOUS

## 2022-06-30 MED ORDER — FENTANYL CITRATE (PF) 100 MCG/2ML IJ SOLN
INTRAMUSCULAR | Status: AC
  Filled 2022-06-30: qty 2

## 2022-06-30 MED ORDER — CEFAZOLIN SODIUM-DEXTROSE 2-4 GM/100ML-% IV SOLN
2.0000 g | INTRAVENOUS | Status: AC
  Administered 2022-06-30: 2 g via INTRAVENOUS

## 2022-06-30 MED ORDER — PROPOFOL 10 MG/ML IV BOLUS
INTRAVENOUS | Status: DC | PRN
  Administered 2022-06-30: 20 mg via INTRAVENOUS

## 2022-06-30 MED ORDER — LIDOCAINE HCL (PF) 0.5 % IJ SOLN
INTRAMUSCULAR | Status: DC | PRN
  Administered 2022-06-30: 30 mL via INTRAVENOUS

## 2022-06-30 MED ORDER — ONDANSETRON HCL 4 MG/2ML IJ SOLN
INTRAMUSCULAR | Status: AC
  Filled 2022-06-30: qty 2

## 2022-06-30 MED ORDER — MIDAZOLAM HCL 2 MG/2ML IJ SOLN
INTRAMUSCULAR | Status: AC
  Filled 2022-06-30: qty 2

## 2022-06-30 MED ORDER — ACETAMINOPHEN 500 MG PO TABS
ORAL_TABLET | ORAL | Status: AC
  Filled 2022-06-30: qty 2

## 2022-06-30 MED ORDER — FENTANYL CITRATE (PF) 100 MCG/2ML IJ SOLN: 25.0000 ug | INTRAMUSCULAR | Status: DC | PRN

## 2022-06-30 MED ORDER — ACETAMINOPHEN 500 MG PO TABS
1000.0000 mg | ORAL_TABLET | Freq: Once | ORAL | Status: AC
  Administered 2022-06-30: 1000 mg via ORAL

## 2022-06-30 MED ORDER — BUPIVACAINE HCL (PF) 0.25 % IJ SOLN
INTRAMUSCULAR | Status: DC | PRN
  Administered 2022-06-30: 9 mL

## 2022-06-30 MED ORDER — PROPOFOL 500 MG/50ML IV EMUL
INTRAVENOUS | Status: DC | PRN
  Administered 2022-06-30: 75 ug/kg/min via INTRAVENOUS

## 2022-06-30 MED ORDER — LACTATED RINGERS IV SOLN: INTRAVENOUS | Status: DC

## 2022-06-30 MED ORDER — PROPOFOL 500 MG/50ML IV EMUL: INTRAVENOUS | Status: DC | PRN

## 2022-06-30 MED ORDER — CEFAZOLIN SODIUM-DEXTROSE 2-4 GM/100ML-% IV SOLN
INTRAVENOUS | Status: AC
  Filled 2022-06-30: qty 100

## 2022-06-30 MED ORDER — ONDANSETRON HCL 4 MG/2ML IJ SOLN: 4.0000 mg | Freq: Once | INTRAMUSCULAR | Status: DC | PRN

## 2022-06-30 MED ORDER — FENTANYL CITRATE (PF) 100 MCG/2ML IJ SOLN
INTRAMUSCULAR | Status: DC | PRN
  Administered 2022-06-30: 50 ug via INTRAVENOUS

## 2022-06-30 SURGICAL SUPPLY — 35 items
APL PRP STRL LF DISP 70% ISPRP (MISCELLANEOUS) ×1
BLADE SURG 15 STRL LF DISP TIS (BLADE) ×2 IMPLANT
BLADE SURG 15 STRL SS (BLADE) ×2
BNDG CMPR 9X4 STRL LF SNTH (GAUZE/BANDAGES/DRESSINGS) ×1
BNDG ELASTIC 3X5.8 VLCR STR LF (GAUZE/BANDAGES/DRESSINGS) ×1 IMPLANT
BNDG ESMARK 4X9 LF (GAUZE/BANDAGES/DRESSINGS) IMPLANT
BNDG GAUZE DERMACEA FLUFF 4 (GAUZE/BANDAGES/DRESSINGS) ×1 IMPLANT
BNDG GZE DERMACEA 4 6PLY (GAUZE/BANDAGES/DRESSINGS) ×1
CHLORAPREP W/TINT 26 (MISCELLANEOUS) ×1 IMPLANT
CORD BIPOLAR FORCEPS 12FT (ELECTRODE) ×1 IMPLANT
COVER BACK TABLE 60X90IN (DRAPES) ×1 IMPLANT
COVER MAYO STAND STRL (DRAPES) ×1 IMPLANT
CUFF TOURN SGL QUICK 18X4 (TOURNIQUET CUFF) ×1 IMPLANT
DRAPE EXTREMITY T 121X128X90 (DISPOSABLE) ×1 IMPLANT
DRAPE SURG 17X23 STRL (DRAPES) ×1 IMPLANT
GAUZE PAD ABD 8X10 STRL (GAUZE/BANDAGES/DRESSINGS) ×1 IMPLANT
GAUZE SPONGE 4X4 12PLY STRL (GAUZE/BANDAGES/DRESSINGS) ×1 IMPLANT
GAUZE XEROFORM 1X8 LF (GAUZE/BANDAGES/DRESSINGS) ×1 IMPLANT
GLOVE BIO SURGEON STRL SZ7.5 (GLOVE) ×1 IMPLANT
GLOVE BIOGEL PI IND STRL 7.0 (GLOVE) IMPLANT
GLOVE BIOGEL PI IND STRL 8 (GLOVE) ×1 IMPLANT
GOWN STRL REUS W/ TWL LRG LVL3 (GOWN DISPOSABLE) ×1 IMPLANT
GOWN STRL REUS W/TWL LRG LVL3 (GOWN DISPOSABLE) ×1
GOWN STRL REUS W/TWL XL LVL3 (GOWN DISPOSABLE) ×1 IMPLANT
NDL HYPO 25X1 1.5 SAFETY (NEEDLE) ×1 IMPLANT
NEEDLE HYPO 25X1 1.5 SAFETY (NEEDLE) ×1 IMPLANT
NS IRRIG 1000ML POUR BTL (IV SOLUTION) ×1 IMPLANT
PACK BASIN DAY SURGERY FS (CUSTOM PROCEDURE TRAY) ×1 IMPLANT
PADDING CAST ABS COTTON 4X4 ST (CAST SUPPLIES) ×1 IMPLANT
STOCKINETTE 4X48 STRL (DRAPES) ×1 IMPLANT
SUT ETHILON 4 0 PS 2 18 (SUTURE) ×1 IMPLANT
SYR BULB EAR ULCER 3OZ GRN STR (SYRINGE) ×1 IMPLANT
SYR CONTROL 10ML LL (SYRINGE) ×1 IMPLANT
TOWEL GREEN STERILE FF (TOWEL DISPOSABLE) ×2 IMPLANT
UNDERPAD 30X36 HEAVY ABSORB (UNDERPADS AND DIAPERS) ×1 IMPLANT

## 2022-06-30 NOTE — Discharge Instructions (Addendum)

## 2022-06-30 NOTE — Transfer of Care (Signed)
Immediate Anesthesia Transfer of Care Note  Patient: Allison Freeman  Procedure(s) Performed: LEFT CARPAL TUNNEL RELEASE (Left: Hand)  Patient Location: PACU  Anesthesia Type:MAC and Bier block  Level of Consciousness: awake, alert  and oriented  Airway & Oxygen Therapy: Patient Spontanous Breathing and Patient connected to face mask oxygen  Post-op Assessment: Report given to RN and Post -op Vital signs reviewed and stable  Post vital signs: Reviewed and stable  Last Vitals:  Vitals Value Taken Time  BP    Temp    Pulse 76 06/30/22 1502  Resp    SpO2 99 % 06/30/22 1502  Vitals shown include unvalidated device data.  Last Pain:  Vitals:   06/30/22 1226  TempSrc: Oral  PainSc: 2       Patients Stated Pain Goal: 5 (94/58/59 2924)  Complications: No notable events documented.

## 2022-06-30 NOTE — Anesthesia Postprocedure Evaluation (Signed)
Anesthesia Post Note  Patient: Allison Freeman  Procedure(s) Performed: LEFT CARPAL TUNNEL RELEASE (Left: Hand)     Patient location during evaluation: PACU Anesthesia Type: MAC Level of consciousness: awake and alert Pain management: pain level controlled Vital Signs Assessment: post-procedure vital signs reviewed and stable Respiratory status: spontaneous breathing, nonlabored ventilation and respiratory function stable Cardiovascular status: stable and blood pressure returned to baseline Anesthetic complications: no   No notable events documented.  Last Vitals:  Vitals:   06/30/22 1226 06/30/22 1502  BP: (!) 163/83 (!) 141/83  Pulse: 84 76  Resp: 18 17  Temp: 36.5 C   SpO2: 99% 99%    Last Pain:  Vitals:   06/30/22 1226  TempSrc: Oral  PainSc: [place]

## 2022-06-30 NOTE — H&P (Signed)
Allison Freeman is an 76 y.o. female.   Chief Complaint: carpal tunnel syndrome HPI: 76 yo female with numbness and tingling left hand.  Positive nerve conduction studies.  She wishes to have left carpal tunnel release.  Allergies: No Known Allergies  Past Medical History:  Diagnosis Date   Acid reflux    Arthritis    Depression    Family history of adverse reaction to anesthesia    Mother woke up and could hearduring surgery   Hypercholesterolemia    Hypertension    Insomnia    Osteoporosis    Panic attacks    panic attacks   Reflux    Restless leg syndrome     Past Surgical History:  Procedure Laterality Date   ABDOMINAL HYSTERECTOMY  2001   BLADDER SURGERY     BREAST BIOPSY  1987   BREAST CYST EXCISION  1998   CARPAL TUNNEL RELEASE Right 04/06/2015   Procedure: CARPAL TUNNEL RELEASE;  Surgeon: Carole Civil, MD;  Location: [facility];  Service: Orthopedics;  Laterality: Right;   COLONOSCOPY  09/19/2004   WUX:LKGMWNUU hemorrhoids, otherwise normal rectum/Sigmoid diverticula.  Remainder of colon mucosa appeared normal   COLONOSCOPY WITH ESOPHAGOGASTRODUODENOSCOPY (EGD) N/A 06/24/2013   VOZ:DGUYQIH polyp-removed. Small hiatal hernia. Abnormal gastric mucosa-query NSAID effect-s/p (chronic inactive gastritis, negative H. pylori)/TCS: Prominent internal hemorrhoids-likely source of hematochezia. Colonic diverticulosis. Colonic polyp-removed (tubular adenoma)   COLONOSCOPY WITH PROPOFOL N/A 11/01/2018   Procedure: COLONOSCOPY WITH PROPOFOL;  Surgeon: Daneil Dolin, MD;  Location: [facility];  Service: Endoscopy;  Laterality: N/A;  7:30am   CYSTOCELE REPAIR  2010   DILATION AND CURETTAGE OF UTERUS     JOINT REPLACEMENT N/A    [employer]rees[employer] 03/02/2020   KNEE SURGERY Right 2003   arthroscopic left   RECTAL SURGERY     RECTOCELE REPAIR  2010   REPLACEMENT TOTAL KNEE Right    TONSILLECTOMY     TOTAL KNEE ARTHROPLASTY Right 05/09/2019   Procedure: TOTAL KNEE ARTHROPLASTY;   Surgeon: Vickey Huger, MD;  Location: [facility];  Service: Orthopedics;  Laterality: Right;    Family History: Family History  Problem Relation Age of Onset   Cancer Mother        Breast   Hypertension Father    Heart attack Father    Cancer Sister        Breast   Cancer Sister        breast   Breast cancer Sister    Colon cancer Neg Hx     Social History:   reports that she has never smoked. She has never used smokeless tobacco. She reports that she does not drink alcohol and does not use drugs.  Medications: Facility-Administered Medications Prior to Admission  Medication Dose Route Frequency Provider Last Rate Last Admin   denosumab (PROLIA) injection 60 mg  60 mg Subcutaneous Q6 months Susy Frizzle, MD   60 mg at 09/06/21 4742   Medications Prior to Admission  Medication Sig Dispense Refill   ALPRAZolam (XANAX) 0.5 MG tablet Take 1 tablet (0.5 mg total) by mouth at bedtime. 30 tablet 2   Calcium-Magnesium-Vitamin D (CALCIUM 1200+D3 PO) Take 1 tablet by mouth daily.     cyclobenzaprine (FLEXERIL) 10 MG tablet TAKE ONE TABLET ('10MG'$  TOTAL) BY MOUTH THREE TIMES DAILY AS NEEDED FOR MUSCLE SPASMS 30 tablet 0   fluticasone (FLONASE) 50 MCG/ACT nasal spray Use 2 sprays in each  nostril once daily (Patient taking differently: Place 2 sprays  into both nostrils at bedtime.) 48 g 3   losartan (COZAAR) 100 MG tablet TAKE ONE TABLET ('100MG'$  TOTAL) BY MOUTH DAILY 90 tablet 3   MELATONIN ER PO Take by mouth.     meloxicam (MOBIC) 15 MG tablet Take 15 mg by mouth daily.     Multiple Vitamin (MULTIVITAMIN) tablet Take 1 tablet by mouth daily.     Omega-3 Fatty Acids (FISH OIL) 1200 MG CAPS Take 1,200 mg by mouth 2 (two) times daily.      pantoprazole (PROTONIX) 40 MG tablet TAKE ONE TABLET TWICE DAILY BEFORE MEALS 180 tablet 1   pravastatin (PRAVACHOL) 40 MG tablet TAKE ONE (1) TABLET BY MOUTH EVERY DAY 90 tablet 1   rOPINIRole (REQUIP) 2 MG tablet TAKE ONE TABLET BY MOUTH EVERY NIGHT AT  BEDTIME 90 tablet 1   traMADol (ULTRAM) 50 MG tablet TAKE ONE TABLET ('50MG'$  TOTAL) BY MOUTH INTHE MORNING, AT NOON, AND AT BEDTIME 90 tablet 0   Carboxymethylcellul-Glycerin (LUBRICATING EYE DROPS OP) Place 1 drop into both eyes 2 (two) times daily.     denosumab (PROLIA) 60 MG/ML SOSY injection Inject 60 mg into the skin every 6 (six) months.     levocetirizine (XYZAL) 5 MG tablet Take 5 mg by mouth daily.      No results found for this or any previous visit (from the past 48 hour(s)).  No results found.    Blood pressure (!) 163/83, pulse 84, temperature 97.7 F (36.5 C), temperature source Oral, resp. rate 18, height '5\' 5"'$  (1.651 m), weight 76.8 kg, SpO2 99 %.  General appearance: alert, cooperative, and appears stated age Head: Normocephalic, without obvious abnormality, atraumatic Neck: supple, symmetrical, trachea midline Extremities: Intact sensation and capillary refill all digits.  +epl/fpl/io.  No wounds.  Pulses: 2+ and symmetric Skin: Skin color, texture, turgor normal. No rashes or lesions Neurologic: Grossly normal Incision/Wound: none  Assessment/Plan Left carpal tunnel syndrome.  Non operative and operative treatment options have been discussed with the patient and patient wishes to proceed with operative treatment. Risks, benefits, and alternatives of surgery have been discussed and the patient agrees with the plan of care.   Allison Freeman 06/30/2022, 12:45 PM

## 2022-06-30 NOTE — Op Note (Signed)
06/30/2022 [facility]                              OPERATIVE REPORT   PREOPERATIVE DIAGNOSIS:  Left carpal tunnel syndrome.  POSTOPERATIVE DIAGNOSIS:  Left carpal tunnel syndrome.  PROCEDURE:  Left carpal tunnel release.  SURGEON:  [name], MD  ASSISTANT:  none.  ANESTHESIA: Bier block with sedation  IV FLUIDS:  Per anesthesia flow sheet.  ESTIMATED BLOOD LOSS:  Minimal.  COMPLICATIONS:  None.  SPECIMENS:  None.  TOURNIQUET TIME:    Total Tourniquet Time Documented: Forearm (Left) - 20 minutes Total: Forearm (Left) - 20 minutes   DISPOSITION:  Stable to PACU.  LOCATION: [facility]  INDICATIONS:  76 yo female with numbness and tingling left hand.  Positive nerve conduction studies.  She wishes to have a carpal tunnel release for management of her symptoms.  Risks, benefits and alternatives of surgery were discussed including the risk of blood loss; infection; damage to nerves, vessels, tendons, ligaments, bone; failure of surgery; need for additional surgery; complications with wound healing; continued pain; recurrence of carpal tunnel syndrome; and damage to motor branch. She voiced understanding of these risks and elected to proceed.   OPERATIVE COURSE:  After being identified preoperatively by myself, the patient and I agreed upon the procedure and site of procedure.  The surgical site was marked.  Surgical consent had been signed.  She was given IV Ancef as preoperative antibiotic prophylaxis.  She was transferred to the operating room and placed on the operating room table in supine position with the Left upper extremity on an armboard.  Bier block anesthesia was induced by the anesthesiologist.  Left upper extremity was prepped and draped in normal sterile orthopaedic fashion.  A surgical pause was performed between the surgeons, anesthesia, and operating room staff, and all were in agreement as to the patient, procedure, and site of  procedure.  Tourniquet at the proximal aspect of the forearm had been inflated for the Bier block  Incision was made over the transverse carpal ligament and carried into the subcutaneous tissues by spreading technique.  Bipolar electrocautery was used to obtain hemostasis.  The palmar fascia was sharply incised.  The transverse carpal ligament was identified.  The fascia distal to the ligament was opened.  Retractor was placed and the flexor tendons were identified.  The flexor tendon to the little finger was identified and retracted radially.  The transverse carpal ligament was then incised from distal to proximal under direct visualization.  Scissors were used to split the distal aspect of the volar antebrachial fascia.  A finger was placed into the wound to ensure complete decompression, which was the case.  The nerve was examined.  It was adherent to the radial leaflet.  The motor branch was identified and was intact.  The wound was copiously irrigated with sterile saline.  It was then closed with 4-0 nylon in a horizontal mattress fashion.  It was injected with 0.25% plain Marcaine to aid in postoperative analgesia.  It was dressed with sterile Xeroform, 4x4s, an ABD, and wrapped with Kerlix and an Ace bandage.  Tourniquet was deflated at 20 minutes.  Fingertips were pink with brisk capillary refill after deflation of the tourniquet.  Operative drapes were broken down.  The patient was awoken from anesthesia safely.  She was transferred back to stretcher and taken to the PACU in stable condition.  I  will see her back in the office in 1 week for postoperative followup.  I will give her a prescription for Tramadol 50 mg 1-2 tabs PO q6 hours prn pain, dispense # 20.    [name], MD Electronically signed, 06/30/22

## 2022-06-30 NOTE — Anesthesia Preprocedure Evaluation (Addendum)
Anesthesia Evaluation  Patient identified by MRN, date of birth, ID band Patient awake    Reviewed: Allergy & Precautions, NPO status , Patient's Chart, lab work & pertinent test results  Airway Mallampati: III  TM Distance: >3 FB Neck ROM: Full    Dental  (+) Teeth Intact, Dental Advisory Given   Pulmonary neg pulmonary ROS,    Pulmonary exam normal breath sounds clear to auscultation       Cardiovascular hypertension (163/83 in preop, skipped cozaar this AM), Pt. on medications Normal cardiovascular exam Rhythm:Regular Rate:Normal     Neuro/Psych PSYCHIATRIC DISORDERS Anxiety Depression negative neurological ROS     GI/Hepatic Neg liver ROS, GERD  Medicated and Controlled,  Endo/Other  negative endocrine ROS  Renal/GU negative Renal ROS  negative genitourinary   Musculoskeletal  (+) Arthritis , Osteoarthritis,    Abdominal   Peds  Hematology negative hematology ROS (+)   Anesthesia Other Findings   Reproductive/Obstetrics negative OB ROS                            Anesthesia Physical Anesthesia Plan  ASA: 2  Anesthesia Plan: MAC and Bier Block and Bier Block-LIDOCAINE ONLY   Post-op Pain Management: Tylenol PO (pre-op)*   Induction:   PONV Risk Score and Plan: 2 and Propofol infusion and TIVA  Airway Management Planned: Natural Airway and Simple Face Mask  Additional Equipment: None  Intra-op Plan:   Post-operative Plan:   Informed Consent: I have reviewed the patients History and Physical, chart, labs and discussed the procedure including the risks, benefits and alternatives for the proposed anesthesia with the patient or authorized representative who has indicated his/her understanding and acceptance.       Plan Discussed with: CRNA  Anesthesia Plan Comments:        Anesthesia Quick Evaluation

## 2022-06-30 NOTE — Anesthesia Procedure Notes (Signed)
Anesthesia Regional Block: Bier block (IV Regional)   Pre-Anesthetic Checklist: , timeout performed,  Correct Patient, Correct Site, Correct Laterality,  Correct Procedure, Correct Position, site marked,  Risks and benefits discussed,  Surgical consent,  Pre-op evaluation,  At surgeon's request  Laterality: Left  Prep: alcohol swabs        Procedures:,,,,, intact distal pulses, Esmarch exsanguination    Narrative:  Start time: 06/30/2022 2:36 PM End time: 06/30/2022 2:37 PM Injection made incrementally with aspirations every 30 mL.  Performed by: With CRNAs  CRNA: Glory Buff, CRNA

## 2022-06-30 NOTE — Anesthesia Procedure Notes (Signed)
Date/Time: 06/30/2022 2:31 PM  Performed by: Glory Buff, CRNAOxygen Delivery Method: Simple face mask

## 2022-07-01 ENCOUNTER — Encounter (HOSPITAL_BASED_OUTPATIENT_CLINIC_OR_DEPARTMENT_OTHER): Payer: Self-pay | Admitting: Orthopedic Surgery

## 2022-07-08 ENCOUNTER — Other Ambulatory Visit: Payer: Self-pay | Admitting: Family Medicine

## 2022-07-08 NOTE — Telephone Encounter (Signed)
Requested Prescriptions  Pending Prescriptions Disp Refills  . pravastatin (PRAVACHOL) 40 MG tablet [Pharmacy Med Name: PRAVASTATIN SODIUM 40 MG TAB] 90 tablet 0    Sig: TAKE ONE (1) TABLET BY MOUTH EVERY DAY     Cardiovascular:  Antilipid - Statins Failed - 07/08/2022  1:16 PM      Failed - Lipid Panel in normal range within the last 12 months    Cholesterol, Total  Date Value Ref Range Status  10/19/2015 169 100 - 199 mg/dL Final   Cholesterol  Date Value Ref Range Status  03/17/2022 162 <200 mg/dL Final   LDL Cholesterol (Calc)  Date Value Ref Range Status  03/17/2022 88 mg/dL (calc) Final    Comment:    Reference range: <100 . Desirable range <100 mg/dL for primary prevention;   <70 mg/dL for patients with CHD or diabetic patients  with > or = 2 CHD risk factors. Marland Kitchen LDL-C is now calculated using the Martin-Hopkins  calculation, which is a validated novel method providing  better accuracy than the Friedewald equation in the  estimation of LDL-C.  Cresenciano Genre et al. Annamaria Helling. 3532;992(42): 2061-2068  (http://education.[employer].com/faq/FAQ164)    HDL  Date Value Ref Range Status  03/17/2022 57 > OR = 50 mg/dL Final  10/19/2015 54 >39 mg/dL Final   Triglycerides  Date Value Ref Range Status  03/17/2022 83 <150 mg/dL Final         Passed - Patient is not pregnant      Passed - Valid encounter within last 12 months    Recent Outpatient Visits          10 months ago Upper respiratory tract infection, unspecified type   [facility] Eulogio Bear, NP   1 year ago Encounter for screening mammogram for malignant neoplasm of breast   [place] Pickard, Cammie Mcgee, MD   1 year ago Other osteoporosis, unspecified pathological fracture presence   [place] Susy Frizzle, MD   2 years ago Acute non-recurrent frontal sinusitis   [place] Susy Frizzle, MD   2 years ago Right low back  pain, unspecified chronicity, unspecified whether sciatica present   [place] Pickard, Cammie Mcgee, MD      Future Appointments            In 2 weeks Irine Seal, MD [facility] [place]

## 2022-07-23 ENCOUNTER — Other Ambulatory Visit: Payer: Self-pay | Admitting: Family Medicine

## 2022-07-23 NOTE — Telephone Encounter (Signed)
Requested medication (s) are due for refill today: yes  Requested medication (s) are on the active medication list:yes  Last refill:  06/23/22  Future visit scheduled: yes  Notes to clinic:  Unable to refill per protocol, cannot delegate.      Requested Prescriptions  Pending Prescriptions Disp Refills   cyclobenzaprine (FLEXERIL) 10 MG tablet [Pharmacy Med Name: CYCLOBENZAPRINE HCL 10 MG TAB] 30 tablet 0    Sig: TAKE ONE TABLET ('10MG'$  TOTAL) BY MOUTH THREE TIMES DAILY AS NEEDED FOR MUSCLE SPASMS     Not Delegated - Analgesics:  Muscle Relaxants Failed - 07/23/2022  2:34 PM      Failed - This refill cannot be delegated      Failed - Valid encounter within last 6 months    Recent Outpatient Visits           11 months ago Upper respiratory tract infection, unspecified type   [facility] Eulogio Bear, NP   1 year ago Encounter for screening mammogram for malignant neoplasm of breast   [place] Pickard, Cammie Mcgee, MD   1 year ago Other osteoporosis, unspecified pathological fracture presence   [place] Susy Frizzle, MD   2 years ago Acute non-recurrent frontal sinusitis   [place] Susy Frizzle, MD   2 years ago Right low back pain, unspecified chronicity, unspecified whether sciatica present   [place] Pickard, Cammie Mcgee, MD       Future Appointments             Tomorrow Irine Seal, MD [place] [facility] [place]            Signed Prescriptions Disp Refills   pantoprazole (PROTONIX) 40 MG tablet 180 tablet 0    Sig: TAKE ONE TABLET TWICE DAILY BEFORE MEALS     Gastroenterology: Proton Pump Inhibitors Passed - 07/23/2022  2:34 PM      Passed - Valid encounter within last 12 months    Recent Outpatient Visits           11 months ago Upper respiratory tract infection, unspecified type   [facility] Eulogio Bear, NP   1 year ago  Encounter for screening mammogram for malignant neoplasm of breast   [place] Pickard, Cammie Mcgee, MD   1 year ago Other osteoporosis, unspecified pathological fracture presence   [place] Susy Frizzle, MD   2 years ago Acute non-recurrent frontal sinusitis   [place] Susy Frizzle, MD   2 years ago Right low back pain, unspecified chronicity, unspecified whether sciatica present   [place] Pickard, Cammie Mcgee, MD       Future Appointments             Tomorrow Irine Seal, MD [place] [facility] [place]

## 2022-07-23 NOTE — Telephone Encounter (Signed)
Requested Prescriptions  Pending Prescriptions Disp Refills  . cyclobenzaprine (FLEXERIL) 10 MG tablet [Pharmacy Med Name: CYCLOBENZAPRINE HCL 10 MG TAB] 30 tablet 0    Sig: TAKE ONE TABLET ('10MG'$  TOTAL) BY MOUTH THREE TIMES DAILY AS NEEDED FOR MUSCLE SPASMS     Not Delegated - Analgesics:  Muscle Relaxants Failed - 07/23/2022  2:34 PM      Failed - This refill cannot be delegated      Failed - Valid encounter within last 6 months    Recent Outpatient Visits          11 months ago Upper respiratory tract infection, unspecified type   [facility] Eulogio Bear, NP   1 year ago Encounter for screening mammogram for malignant neoplasm of breast   [place] Pickard, Cammie Mcgee, MD   1 year ago Other osteoporosis, unspecified pathological fracture presence   [place] Susy Frizzle, MD   2 years ago Acute non-recurrent frontal sinusitis   [place] Susy Frizzle, MD   2 years ago Right low back pain, unspecified chronicity, unspecified whether sciatica present   [place] Pickard, Cammie Mcgee, MD      Future Appointments            Tomorrow Irine Seal, [facility] [place]           . pantoprazole (PROTONIX) 40 MG tablet [Pharmacy Med Name: PANTOPRAZOLE SODIUM 40 MG DR TAB] 180 tablet 0    Sig: TAKE ONE TABLET TWICE DAILY BEFORE MEALS     Gastroenterology: Proton Pump Inhibitors Passed - 07/23/2022  2:34 PM      Passed - Valid encounter within last 12 months    Recent Outpatient Visits          11 months ago Upper respiratory tract infection, unspecified type   [facility] Eulogio Bear, NP   1 year ago Encounter for screening mammogram for malignant neoplasm of breast   [place] Pickard, Cammie Mcgee, MD   1 year ago Other osteoporosis, unspecified pathological fracture presence   [place] Susy Frizzle, MD   2 years ago Acute non-recurrent frontal sinusitis   [place] Susy Frizzle, MD   2 years ago Right low back pain, unspecified chronicity, unspecified whether sciatica present   [place] Pickard, Cammie Mcgee, MD      Future Appointments            Tomorrow Irine Seal, MD [facility] [place]

## 2022-07-24 ENCOUNTER — Ambulatory Visit (INDEPENDENT_AMBULATORY_CARE_PROVIDER_SITE_OTHER): Payer: Medicare HMO | Admitting: Urology

## 2022-07-24 ENCOUNTER — Encounter: Payer: Self-pay | Admitting: Urology

## 2022-07-24 DIAGNOSIS — N3281 Overactive bladder: Secondary | ICD-10-CM

## 2022-07-24 DIAGNOSIS — N1831 Chronic kidney disease, stage 3a: Secondary | ICD-10-CM

## 2022-07-24 DIAGNOSIS — N3941 Urge incontinence: Secondary | ICD-10-CM | POA: Diagnosis not present

## 2022-07-24 DIAGNOSIS — Z87440 Personal history of urinary (tract) infections: Secondary | ICD-10-CM | POA: Diagnosis not present

## 2022-07-24 LAB — MICROSCOPIC EXAMINATION
Bacteria, UA: NONE SEEN
RBC, Urine: NONE SEEN /hpf (ref 0–2)

## 2022-07-24 LAB — URINALYSIS, ROUTINE W REFLEX MICROSCOPIC
Bilirubin, UA: NEGATIVE
Glucose, UA: NEGATIVE
Ketones, UA: NEGATIVE
Nitrite, UA: NEGATIVE
Protein,UA: NEGATIVE
RBC, UA: NEGATIVE
Specific Gravity, UA: 1.01 (ref 1.005–1.030)
Urobilinogen, Ur: 0.2 mg/dL (ref 0.2–1.0)
pH, UA: 6 (ref 5.0–7.5)

## 2022-07-24 MED ORDER — GEMTESA 75 MG PO TABS: 75.0000 mg | ORAL_TABLET | Freq: Every day | ORAL | 11 refills | Status: DC

## 2022-07-24 NOTE — Progress Notes (Signed)
Subjective:  1. Urge incontinence   2. Overactive bladder   3. Personal history of urinary infection   4. Stage 3a chronic kidney disease ([place])     07/24/22: Allison Freeman returns today in f/u.  She has a history of OAB wet and prior UTI's with vaginal atrophy.   She was given Gemtesa at the last visit and it helped but she is off because of cost but she has a new insurance and it should be cheaper.  She is no longer on topical estrogen..  She  exhausted her PTNS benefit.   She has had no recent UTI's.   She continues to have urgency with UUI but doesn't use pads.  She has no significant SUI.  She has nocturia 2-3x.  She was  noted to have CKD3 in June with a Cr of 1.13 and a GFR of 51.  She has been on Meloxicam for the last 4-6 months for arthritis.  01/02/22: Allison Freeman returns today in f/u for his history of OAB and UTI's with vaginal atrophy.   She is on no meds from [place] now.   She has had no UTI's.  She continues to use 1ppd.   She had a good response to [place] but it was too expensive.       ROS:  ROS:  A complete review of systems was performed.  All systems are negative except for pertinent findings as noted.   Review of Systems  Gastrointestinal:  Positive for heartburn.  Musculoskeletal:  Positive for back pain and joint pain.  All other systems reviewed and are negative.   No Known Allergies   Outpatient Encounter Medications as of 07/24/2022  Medication Sig   Vibegron (GEMTESA) 75 MG TABS Take 75 mg by mouth daily.   ALPRAZolam (XANAX) 0.5 MG tablet Take 1 tablet (0.5 mg total) by mouth at bedtime.   Calcium-Magnesium-Vitamin D (CALCIUM 1200+D3 PO) Take 1 tablet by mouth daily.   Carboxymethylcellul-Glycerin (LUBRICATING EYE DROPS OP) Place 1 drop into both eyes 2 (two) times daily.   cyclobenzaprine (FLEXERIL) 10 MG tablet TAKE ONE TABLET ('10MG'$  TOTAL) BY MOUTH THREE TIMES DAILY AS NEEDED FOR MUSCLE SPASMS   denosumab (PROLIA) 60 MG/ML SOSY injection Inject 60 mg into the skin every  6 (six) months.   fluticasone (FLONASE) 50 MCG/ACT nasal spray Use 2 sprays in each  nostril once daily (Patient taking differently: Place 2 sprays into both nostrils at bedtime.)   levocetirizine (XYZAL) 5 MG tablet Take 5 mg by mouth daily.   losartan (COZAAR) 100 MG tablet TAKE ONE TABLET ('100MG'$  TOTAL) BY MOUTH DAILY   MELATONIN ER PO Take by mouth.   meloxicam (MOBIC) 15 MG tablet Take 15 mg by mouth daily.   Multiple Vitamin (MULTIVITAMIN) tablet Take 1 tablet by mouth daily.   Omega-3 Fatty Acids (FISH OIL) 1200 MG CAPS Take 1,200 mg by mouth 2 (two) times daily.    pantoprazole (PROTONIX) 40 MG tablet TAKE ONE TABLET TWICE DAILY BEFORE MEALS   pravastatin (PRAVACHOL) 40 MG tablet TAKE ONE (1) TABLET BY MOUTH EVERY DAY   rOPINIRole (REQUIP) 2 MG tablet TAKE ONE TABLET BY MOUTH EVERY NIGHT AT BEDTIME   traMADol (ULTRAM) 50 MG tablet TAKE ONE TABLET ('50MG'$  TOTAL) BY MOUTH INTHE MORNING, AT NOON, AND AT BEDTIME   Facility-Administered Encounter Medications as of 07/24/2022  Medication   denosumab (PROLIA) injection 60 mg    Past Medical History:  Diagnosis Date   Acid reflux    Arthritis  Depression    Family history of adverse reaction to anesthesia    Mother woke up and could hearduring surgery   Hypercholesterolemia    Hypertension    Insomnia    Osteoporosis    Panic attacks    panic attacks   Reflux    Restless leg syndrome     Past Surgical History:  Procedure Laterality Date   ABDOMINAL HYSTERECTOMY  2001   BLADDER SURGERY     BREAST BIOPSY  1987   BREAST CYST EXCISION  1998   CARPAL TUNNEL RELEASE Right 04/06/2015   Procedure: CARPAL TUNNEL RELEASE;  Surgeon: Carole Civil, MD;  Location: [facility];  Service: Orthopedics;  Laterality: Right;   CARPAL TUNNEL RELEASE Left 06/30/2022   Procedure: LEFT CARPAL TUNNEL RELEASE;  Surgeon: [name], MD;  Location: [facility];  Service: Orthopedics;  Laterality: Left;   COLONOSCOPY  09/19/2004    GEX:BMWUXLKG hemorrhoids, otherwise normal rectum/Sigmoid diverticula.  Remainder of colon mucosa appeared normal   COLONOSCOPY WITH ESOPHAGOGASTRODUODENOSCOPY (EGD) N/A 06/24/2013   MWN:UUVOZDG polyp-removed. Small hiatal hernia. Abnormal gastric mucosa-query NSAID effect-s/p (chronic inactive gastritis, negative H. pylori)/TCS: Prominent internal hemorrhoids-likely source of hematochezia. Colonic diverticulosis. Colonic polyp-removed (tubular adenoma)   COLONOSCOPY WITH PROPOFOL N/A 11/01/2018   Procedure: COLONOSCOPY WITH PROPOFOL;  Surgeon: Daneil Dolin, MD;  Location: [facility];  Service: Endoscopy;  Laterality: N/A;  7:30am   CYSTOCELE REPAIR  2010   DILATION AND CURETTAGE OF UTERUS     JOINT REPLACEMENT N/A    [employer] 03/02/2020   KNEE SURGERY Right 2003   arthroscopic left   RECTAL SURGERY     RECTOCELE REPAIR  2010   REPLACEMENT TOTAL KNEE Right    TONSILLECTOMY     TOTAL KNEE ARTHROPLASTY Right 05/09/2019   Procedure: TOTAL KNEE ARTHROPLASTY;  Surgeon: Vickey Huger, MD;  Location: [facility];  Service: Orthopedics;  Laterality: Right;    Social History   Socioeconomic History   Marital status: Married    Spouse name: Not on file   Number of children: Not on file   Years of education: Not on file   Highest education level: Not on file  Occupational History   Not on file  Tobacco Use   Smoking status: Never   Smokeless tobacco: Never  Vaping Use   Vaping Use: Never used  Substance and Sexual Activity   Alcohol use: No   Drug use: No   Sexual activity: Yes    Birth control/protection: Surgical  Other Topics Concern   Not on file  Social History Narrative   Not on file   Social Determinants of Health   Financial Resource Strain: Not on file  Food Insecurity: Not on file  Transportation Needs: Not on file  Physical Activity: Not on file  Stress: Not on file  Social Connections: Not on file  Intimate Partner Violence: Not on file    Family History  Problem  Relation Age of Onset   Cancer Mother        Breast   Hypertension Father    Heart attack Father    Cancer Sister        Breast   Cancer Sister        breast   Breast cancer Sister    Colon cancer Neg Hx        Objective: There were no vitals filed for this visit.    Physical Exam  Lab Results:  Recent Results (from the past  2160 hour(s))  Urinalysis, Routine w reflex microscopic     Status: Abnormal   Collection Time: 07/24/22 10:49 AM  Result Value Ref Range   Specific Gravity, UA 1.010 1.005 - 1.030   pH, UA 6.0 5.0 - 7.5   Color, UA Yellow Yellow   Appearance Ur Clear Clear   Leukocytes,UA Trace (A) Negative   Protein,UA Negative Negative/Trace   Glucose, UA Negative Negative   Ketones, UA Negative Negative   RBC, UA Negative Negative   Bilirubin, UA Negative Negative   Urobilinogen, Ur 0.2 0.2 - 1.0 mg/dL   Nitrite, UA Negative Negative   Microscopic Examination See below:   Microscopic Examination     Status: None   Collection Time: 07/24/22 10:49 AM   Urine  Result Value Ref Range   WBC, UA 0-5 0 - 5 /hpf   RBC, Urine None seen 0 - 2 /hpf   Epithelial Cells (non renal) 0-10 0 - 10 /hpf   Bacteria, UA None seen None seen/Few    UA is unremarkable.   Studies/Results: MM 3D SCREEN BREAST BILATERAL  Result Date: 05/09/2022 CLINICAL DATA:  Screening. EXAM: DIGITAL SCREENING BILATERAL MAMMOGRAM WITH TOMOSYNTHESIS AND CAD TECHNIQUE: Bilateral screening digital craniocaudal and mediolateral oblique mammograms were obtained. Bilateral screening digital breast tomosynthesis was performed. The images were evaluated with computer-aided detection. COMPARISON:  Previous exam(s). ACR Breast Density Category c: The breast tissue is heterogeneously dense, which may obscure small masses. FINDINGS: There are no findings suspicious for malignancy. IMPRESSION: No mammographic evidence of malignancy. A result letter of this screening mammogram will be mailed directly to the  patient. RECOMMENDATION: Screening mammogram in one year. (Code:SM-B-01Y) BI-RADS CATEGORY  1: Negative. Electronically Signed   By: Abelardo Diesel [name]   On: 05/09/2022 13:55       Assessment & Plan: OAB wet.   I have given her additional Gemtesa samples and sent a script to [facility].  She will return in 1 year.      Vaginal atrophy with history of UTI's.  She has done well without recurrence despite stopping the estrogen.     CKD3.  This was a new finding in June.  She has been on Meloxicam for several months which could contribute to the CKD so I suggested she contact Dr. Dennard Schaumann about that but would probably be best served to stop it.   Meds ordered this encounter  Medications   Vibegron (GEMTESA) 75 MG TABS    Sig: Take 75 mg by mouth daily.    Dispense:  30 tablet    Refill:  11      Orders Placed This Encounter  Procedures   Microscopic Examination   Urinalysis, Routine w reflex microscopic      Return in about 1 year (around 07/25/2023).   CC: Susy Frizzle, MD      Allison Freeman 07/25/2022  Patient ID: Allison Freeman, female   DOB: 1946-08-31, 76 y.o.   MRN: [id number]

## 2022-07-30 ENCOUNTER — Other Ambulatory Visit: Payer: Self-pay | Admitting: Family Medicine

## 2022-07-30 NOTE — Telephone Encounter (Signed)
Requested medication (s) are due for refill today: yes  Requested medication (s) are on the active medication list: yes  Last refill:  04/17/22 #30 with 2 RF  Future visit scheduled: no, seen 03/17/22  Notes to clinic:  This medication can not be delegated, please assess.        Requested Prescriptions  Pending Prescriptions Disp Refills   ALPRAZolam (XANAX) 0.5 MG tablet [Pharmacy Med Name: ALPRAZOLAM 0.5 MG TAB] 30 tablet     Sig: TAKE ONE TABLET (0.'5MG'$  TOTAL) BY MOUTH AT BEDTIME     Not Delegated - Psychiatry: Anxiolytics/Hypnotics 2 Failed - 07/30/2022  2:42 PM      Failed - This refill cannot be delegated      Failed - Urine Drug Screen completed in last 360 days      Failed - Valid encounter within last 6 months    Recent Outpatient Visits           11 months ago Upper respiratory tract infection, unspecified type   [facility] Eulogio Bear, NP   1 year ago Encounter for screening mammogram for malignant neoplasm of breast   [place] Pickard, Cammie Mcgee, MD   1 year ago Other osteoporosis, unspecified pathological fracture presence   [place] Susy Frizzle, MD   2 years ago Acute non-recurrent frontal sinusitis   [place] Susy Frizzle, MD   2 years ago Right low back pain, unspecified chronicity, unspecified whether sciatica present   [place] Pickard, Cammie Mcgee, MD       Future Appointments             In 1 year Irine Seal, MD [facility] - Patient is not pregnant

## 2022-08-20 ENCOUNTER — Other Ambulatory Visit: Payer: Self-pay | Admitting: Family Medicine

## 2022-08-20 DIAGNOSIS — I10 Essential (primary) hypertension: Secondary | ICD-10-CM

## 2022-08-20 NOTE — Telephone Encounter (Signed)
Requested Prescriptions  Pending Prescriptions Disp Refills   cyclobenzaprine (FLEXERIL) 10 MG tablet [Pharmacy Med Name: CYCLOBENZAPRINE HCL 10 MG TAB] 30 tablet 0    Sig: TAKE ONE TABLET ('10MG'$  TOTAL) BY MOUTH THREE TIMES DAILY AS NEEDED FOR MUSCLE SPASMS     Not Delegated - Analgesics:  Muscle Relaxants Failed - 08/20/2022  9:59 AM      Failed - This refill cannot be delegated      Failed - Valid encounter within last 6 months    Recent Outpatient Visits           1 year ago Upper respiratory tract infection, unspecified type   [place] Eulogio Bear, NP   1 year ago Encounter for screening mammogram for malignant neoplasm of breast   [place] Pickard, Cammie Mcgee, MD   1 year ago Other osteoporosis, unspecified pathological fracture presence   [place] Susy Frizzle, MD   2 years ago Acute non-recurrent frontal sinusitis   [place] Susy Frizzle, MD   2 years ago Right low back pain, unspecified chronicity, unspecified whether sciatica present   [place] Susy Frizzle, MD       Future Appointments             In 11 months Irine Seal, MD [facility] [place]             losartan (COZAAR) 100 MG tablet [Pharmacy Med Name: LOSARTAN POTASSIUM 100 MG TAB] 90 tablet 0    Sig: TAKE ONE TABLET ('100MG'$  TOTAL) BY MOUTH DAILY     Cardiovascular:  Angiotensin Receptor Blockers Failed - 08/20/2022  9:59 AM      Failed - Cr in normal range and within 180 days    Creat  Date Value Ref Range Status  03/17/2022 1.13 (H) 0.60 - 1.00 mg/dL Final         Failed - Last BP in normal range    BP Readings from Last 1 Encounters:  06/30/22 (!) 140/70         Failed - Valid encounter within last 6 months    Recent Outpatient Visits           1 year ago Upper respiratory tract infection, unspecified type   [facility] Eulogio Bear, NP    1 year ago Encounter for screening mammogram for malignant neoplasm of breast   [place] Pickard, Cammie Mcgee, MD   1 year ago Other osteoporosis, unspecified pathological fracture presence   [place] Susy Frizzle, MD   2 years ago Acute non-recurrent frontal sinusitis   [place] Susy Frizzle, MD   2 years ago Right low back pain, unspecified chronicity, unspecified whether sciatica present   [place] Pickard, Cammie Mcgee, MD       Future Appointments             In 11 months Irine Seal, MD [facility] [place]            Passed - K in normal range and within 180 days    Potassium  Date Value Ref Range Status  03/17/2022 4.3 3.5 - 5.3 mmol/L Final         Passed - Patient is not pregnant

## 2022-08-20 NOTE — Telephone Encounter (Signed)
Requested medication (s) are due for refill today: Due 08/23/22  Requested medication (s) are on the active medication list: yes    Last refill: 07/24/22 #30  0 refills  Future visit scheduled no  Notes to clinic:Not delegated, please review.  Requested Prescriptions  Pending Prescriptions Disp Refills   cyclobenzaprine (FLEXERIL) 10 MG tablet [Pharmacy Med Name: CYCLOBENZAPRINE HCL 10 MG TAB] 30 tablet 0    Sig: TAKE ONE TABLET ('10MG'$  TOTAL) BY MOUTH THREE TIMES DAILY AS NEEDED FOR MUSCLE SPASMS     Not Delegated - Analgesics:  Muscle Relaxants Failed - 08/20/2022  9:59 AM      Failed - This refill cannot be delegated      Failed - Valid encounter within last 6 months    Recent Outpatient Visits           1 year ago Upper respiratory tract infection, unspecified type   [place] Eulogio Bear, NP   1 year ago Encounter for screening mammogram for malignant neoplasm of breast   [place] Pickard, Cammie Mcgee, MD   1 year ago Other osteoporosis, unspecified pathological fracture presence   [place] Susy Frizzle, MD   2 years ago Acute non-recurrent frontal sinusitis   [place] Susy Frizzle, MD   2 years ago Right low back pain, unspecified chronicity, unspecified whether sciatica present   [place] Susy Frizzle, MD       Future Appointments             In 11 months Irine Seal, MD [facility] [place]            Signed Prescriptions Disp Refills   losartan (COZAAR) 100 MG tablet 90 tablet 0    Sig: TAKE ONE TABLET ('100MG'$  TOTAL) BY MOUTH DAILY     Cardiovascular:  Angiotensin Receptor Blockers Failed - 08/20/2022  9:59 AM      Failed - Cr in normal range and within 180 days    Creat  Date Value Ref Range Status  03/17/2022 1.13 (H) 0.60 - 1.00 mg/dL Final         Failed - Last BP in normal range    BP Readings from Last 1 Encounters:   06/30/22 (!) 140/70         Failed - Valid encounter within last 6 months    Recent Outpatient Visits           1 year ago Upper respiratory tract infection, unspecified type   [place] Eulogio Bear, NP   1 year ago Encounter for screening mammogram for malignant neoplasm of breast   [place] Pickard, Cammie Mcgee, MD   1 year ago Other osteoporosis, unspecified pathological fracture presence   [place] Susy Frizzle, MD   2 years ago Acute non-recurrent frontal sinusitis   [place] Susy Frizzle, MD   2 years ago Right low back pain, unspecified chronicity, unspecified whether sciatica present   [place] Pickard, Cammie Mcgee, MD       Future Appointments             In 11 months Irine Seal, MD [facility] [place]            Passed - K in normal range and within 180 days    Potassium  Date Value Ref Range Status  03/17/2022 4.3 3.5 - 5.3 mmol/L Final         Passed - Patient is not pregnant

## 2022-08-27 ENCOUNTER — Other Ambulatory Visit: Payer: Self-pay | Admitting: Family Medicine

## 2022-08-27 NOTE — Telephone Encounter (Signed)
Patient needs OV, will refill medication until OV can be made. Patient needs OV for additional refills.  Requested Prescriptions  Pending Prescriptions Disp Refills   rOPINIRole (REQUIP) 2 MG tablet [Pharmacy Med Name: ROPINIROLE HCL 2 MG TAB] 30 tablet 0    Sig: TAKE ONE TABLET BY MOUTH EVERY NIGHT AT BEDTIME     Neurology:  Parkinsonian Agents Failed - 08/27/2022  1:48 PM      Failed - Last BP in normal range    BP Readings from Last 1 Encounters:  06/30/22 (!) 140/70         Failed - Valid encounter within last 12 months    Recent Outpatient Visits           1 year ago Upper respiratory tract infection, unspecified type   [facility] Eulogio Bear, NP   1 year ago Encounter for screening mammogram for malignant neoplasm of breast   [place] Pickard, Cammie Mcgee, MD   1 year ago Other osteoporosis, unspecified pathological fracture presence   [place] Susy Frizzle, MD   2 years ago Acute non-recurrent frontal sinusitis   [place] Susy Frizzle, MD   2 years ago Right low back pain, unspecified chronicity, unspecified whether sciatica present   [place] Pickard, Cammie Mcgee, MD       Future Appointments             In 11 months Irine Seal, MD [facility] [place]            Passed - Last Heart Rate in normal range    Pulse Readings from Last 1 Encounters:  06/30/22 69

## 2022-09-08 ENCOUNTER — Other Ambulatory Visit: Payer: Self-pay

## 2022-09-08 ENCOUNTER — Telehealth: Payer: Self-pay

## 2022-09-08 NOTE — Telephone Encounter (Signed)
Attempted to call & LVM for pt to schedule Prolia Injection.

## 2022-09-10 ENCOUNTER — Ambulatory Visit: Payer: Medicare HMO

## 2022-09-17 ENCOUNTER — Other Ambulatory Visit: Payer: Self-pay | Admitting: Family Medicine

## 2022-09-18 ENCOUNTER — Encounter: Payer: Self-pay | Admitting: Family Medicine

## 2022-09-19 ENCOUNTER — Ambulatory Visit (INDEPENDENT_AMBULATORY_CARE_PROVIDER_SITE_OTHER): Payer: Medicare HMO

## 2022-09-19 DIAGNOSIS — I10 Essential (primary) hypertension: Secondary | ICD-10-CM | POA: Diagnosis not present

## 2022-09-19 DIAGNOSIS — M810 Age-related osteoporosis without current pathological fracture: Secondary | ICD-10-CM | POA: Diagnosis not present

## 2022-09-19 DIAGNOSIS — E7800 Pure hypercholesterolemia, unspecified: Secondary | ICD-10-CM | POA: Diagnosis not present

## 2022-09-19 MED ORDER — DENOSUMAB 60 MG/ML ~~LOC~~ SOSY
60.0000 mg | PREFILLED_SYRINGE | Freq: Once | SUBCUTANEOUS | Status: AC
  Administered 2022-09-19: 60 mg via SUBCUTANEOUS

## 2022-09-23 ENCOUNTER — Telehealth: Payer: Self-pay

## 2022-09-23 NOTE — Telephone Encounter (Signed)
PA REQUIRED FOR TRAMADOL RX.   Allison Freeman (Key: [id number])  Your information has been sent to [facility].

## 2022-09-24 ENCOUNTER — Other Ambulatory Visit: Payer: Medicare HMO

## 2022-09-24 DIAGNOSIS — E7800 Pure hypercholesterolemia, unspecified: Secondary | ICD-10-CM

## 2022-09-25 LAB — LIPID PANEL
Cholesterol: 163 mg/dL (ref <200)
HDL: 57 mg/dL (ref >=50)
LDL Cholesterol (Calc): 88 mg/dL (calc)
Non-HDL Cholesterol (Calc): 106 mg/dL (calc) (ref <130)
Total CHOL/HDL Ratio: 2.9 (calc) (ref <5.0)
Triglycerides: 90 mg/dL (ref <150)

## 2022-09-25 LAB — CBC WITH DIFFERENTIAL/PLATELET
Absolute Monocytes: 456 cells/uL (ref 200–950)
Basophils Absolute: 53 cells/uL (ref 0–200)
Basophils Relative: 1 %
Eosinophils Absolute: 191 cells/uL (ref 15–500)
Eosinophils Relative: 3.6 %
HCT: 39.3 % (ref 35.0–45.0)
Hemoglobin: 13.3 g/dL (ref 11.7–15.5)
Lymphs Abs: 2009 cells/uL (ref 850–3900)
MCH: 30.1 pg (ref 27.0–33.0)
MCHC: 33.8 g/dL (ref 32.0–36.0)
MCV: 88.9 fL (ref 80.0–100.0)
MPV: 10.4 fL (ref 7.5–12.5)
Monocytes Relative: 8.6 %
Neutro Abs: 2592 cells/uL (ref 1500–7800)
Neutrophils Relative %: 48.9 %
Platelets: 260 10*3/uL (ref 140–400)
RBC: 4.42 10*6/uL (ref 3.80–5.10)
RDW: 12.5 % (ref 11.0–15.0)
Total Lymphocyte: 37.9 %
WBC: 5.3 10*3/uL (ref 3.8–10.8)

## 2022-09-25 LAB — COMPLETE METABOLIC PANEL WITH GFR
AG Ratio: 1.5 (calc) (ref 1.0–2.5)
ALT: 17 U/L (ref 6–29)
AST: 20 U/L (ref 10–35)
Albumin: 4 g/dL (ref 3.6–5.1)
Alkaline phosphatase (APISO): 39 U/L (ref 37–153)
BUN: 24 mg/dL (ref 7–25)
CO2: 27 mmol/L (ref 20–32)
Calcium: 9.7 mg/dL (ref 8.6–10.4)
Chloride: 108 mmol/L (ref 98–110)
Creat: 0.88 mg/dL (ref 0.60–1.00)
Globulin: 2.6 g/dL (calc) (ref 1.9–3.7)
Glucose, Bld: 94 mg/dL (ref 65–99)
Potassium: 4.6 mmol/L (ref 3.5–5.3)
Sodium: 143 mmol/L (ref 135–146)
Total Bilirubin: 0.9 mg/dL (ref 0.2–1.2)
Total Protein: 6.6 g/dL (ref 6.1–8.1)
eGFR: 69 mL/min/{1.73_m2} (ref >=60)

## 2022-09-30 ENCOUNTER — Other Ambulatory Visit: Payer: Self-pay | Admitting: Family Medicine

## 2022-10-06 ENCOUNTER — Other Ambulatory Visit: Payer: Self-pay | Admitting: Family Medicine

## 2022-10-15 ENCOUNTER — Telehealth: Payer: Self-pay

## 2022-10-15 NOTE — Telephone Encounter (Signed)
[place] Alex (Key: [id number]) Need Help? Call us at [phone] Status New (Not sent to plan) Drug traMADol HCl '50MG'$  tablets  Form [employer] Electronic PA Form There was an error with your request Eligibility could not be verified for this patient - patient not found. Please review patient information and re-submit.

## 2022-10-27 ENCOUNTER — Other Ambulatory Visit: Payer: Self-pay | Admitting: Family Medicine

## 2022-10-27 ENCOUNTER — Telehealth: Payer: Self-pay

## 2022-10-27 ENCOUNTER — Encounter: Payer: Self-pay | Admitting: Family Medicine

## 2022-10-27 MED ORDER — NIRMATRELVIR/RITONAVIR (PAXLOVID)TABLET: 3.0000 | ORAL_TABLET | Freq: Two times a day (BID) | ORAL | 0 refills | Status: AC

## 2022-10-27 NOTE — Telephone Encounter (Signed)
Pt called and states she and husband both tested positive for Covid this morning. Pt asks if Rx can be sent in to treat. Separate TE done for pt's husband, [place].

## 2022-10-28 ENCOUNTER — Other Ambulatory Visit: Payer: Self-pay | Admitting: Family Medicine

## 2022-10-30 ENCOUNTER — Other Ambulatory Visit: Payer: Self-pay | Admitting: Family Medicine

## 2022-10-30 MED ORDER — CYCLOBENZAPRINE HCL 10 MG PO TABS: ORAL_TABLET | ORAL | 3 refills | Status: DC

## 2022-10-30 MED ORDER — ROPINIROLE HCL 2 MG PO TABS: 2.0000 mg | ORAL_TABLET | Freq: Every day | ORAL | 3 refills | Status: DC

## 2022-10-30 MED ORDER — ALPRAZOLAM 0.5 MG PO TABS: ORAL_TABLET | ORAL | 2 refills | Status: DC

## 2022-11-20 ENCOUNTER — Telehealth: Payer: Self-pay

## 2022-11-20 NOTE — Telephone Encounter (Signed)
Prescription Request  11/20/2022   LOV: 03/14/22 What is the name of the medication or equipment? traMADol (ULTRAM) 50 MG tablet [id number]  Have you contacted your pharmacy to request a refill? Yes   Which pharmacy would you like this sent to?   [facility] [place], [address], [place] [address] Phone: [phone]  Fax: [phone]    Patient notified that their request is being sent to the clinical staff for review and that they should receive a response within 2 business days.   Please advise at [place]

## 2022-11-20 NOTE — Telephone Encounter (Signed)
Prescription Request  11/20/2022   LOV:03/14/22  What is the name of the medication or equipment? Vibegron (GEMTESA) 75 MG TABS [id number]  Have you contacted your pharmacy to request a refill? Yes   Which pharmacy would you like this sent to?  [facility], [address], [place] [address] Phone: [phone]  Fax: [phone]    Patient notified that their request is being sent to the clinical staff for review and that they should receive a response within 2 business days.   Please advise at [place]

## 2022-11-20 NOTE — Telephone Encounter (Signed)
Prescription Request  11/20/2022   LOV: 03/14/22  What is the name of the medication or equipment? losartan (COZAAR) 100 MG tablet [id number]  Have you contacted your pharmacy to request a refill? Yes   Which pharmacy would you like this sent to?  [facility], [address], [place] [address] Phone: [phone]  Fax: [phone]     Patient notified that their request is being sent to the clinical staff for review and that they should receive a response within 2 business days.   Please advise at [place]

## 2022-11-21 ENCOUNTER — Other Ambulatory Visit: Payer: Self-pay | Admitting: Family Medicine

## 2022-11-21 ENCOUNTER — Other Ambulatory Visit: Payer: Self-pay

## 2022-11-21 ENCOUNTER — Encounter: Payer: Self-pay | Admitting: Family Medicine

## 2022-11-21 DIAGNOSIS — I10 Essential (primary) hypertension: Secondary | ICD-10-CM

## 2022-11-21 DIAGNOSIS — N3281 Overactive bladder: Secondary | ICD-10-CM

## 2022-11-21 DIAGNOSIS — N3941 Urge incontinence: Secondary | ICD-10-CM

## 2022-11-21 MED ORDER — TRAMADOL HCL 50 MG PO TABS: 50.0000 mg | ORAL_TABLET | Freq: Three times a day (TID) | ORAL | 0 refills | Status: DC

## 2022-11-21 MED ORDER — GEMTESA 75 MG PO TABS: 75.0000 mg | ORAL_TABLET | Freq: Every day | ORAL | 1 refills | Status: DC

## 2022-11-21 MED ORDER — CYCLOBENZAPRINE HCL 10 MG PO TABS: ORAL_TABLET | ORAL | 3 refills | Status: DC

## 2022-11-21 MED ORDER — LOSARTAN POTASSIUM 100 MG PO TABS: ORAL_TABLET | ORAL | 1 refills | Status: DC

## 2022-12-23 ENCOUNTER — Other Ambulatory Visit: Payer: Self-pay | Admitting: Family Medicine

## 2022-12-31 ENCOUNTER — Other Ambulatory Visit: Payer: Self-pay | Admitting: Family Medicine

## 2022-12-31 DIAGNOSIS — H18513 Endothelial corneal dystrophy, bilateral: Secondary | ICD-10-CM | POA: Diagnosis not present

## 2022-12-31 DIAGNOSIS — H35363 Drusen (degenerative) of macula, bilateral: Secondary | ICD-10-CM | POA: Diagnosis not present

## 2022-12-31 DIAGNOSIS — H04123 Dry eye syndrome of bilateral lacrimal glands: Secondary | ICD-10-CM | POA: Diagnosis not present

## 2022-12-31 DIAGNOSIS — H2513 Age-related nuclear cataract, bilateral: Secondary | ICD-10-CM | POA: Diagnosis not present

## 2023-01-01 NOTE — Telephone Encounter (Signed)
Requested Prescriptions  Pending Prescriptions Disp Refills   pravastatin (PRAVACHOL) 40 MG tablet [Pharmacy Med Name: PRAVASTATIN SODIUM 40 MG TAB] 90 tablet 0    Sig: TAKE ONE (1) TABLET BY MOUTH EVERY DAY     Cardiovascular:  Antilipid - Statins Failed - 12/31/2022 11:05 AM      Failed - Valid encounter within last 12 months    Recent Outpatient Visits           1 year ago Upper respiratory tract infection, unspecified type   [facility] Valentino Nose, NP   1 year ago Encounter for screening mammogram for malignant neoplasm of breast   [facility] Pickard, Priscille Heidelberg, MD   2 years ago Other osteoporosis, unspecified pathological fracture presence   [facility] Donita Brooks, MD   2 years ago Acute non-recurrent frontal sinusitis   [facility] Donita Brooks, MD   2 years ago Right low back pain, unspecified chronicity, unspecified whether sciatica present   [facility] Pickard, Priscille Heidelberg, MD       Future Appointments             In 2 months Pickard, Priscille Heidelberg, MD [place] [facility]   In 7 months Bjorn Pippin, MD [facility] [place]            Failed - Lipid Panel in normal range within the last 12 months    Cholesterol, Total  Date Value Ref Range Status  10/19/2015 169 100 - 199 mg/dL Final   Cholesterol  Date Value Ref Range Status  09/19/2022 163 <200 mg/dL Final   LDL Cholesterol (Calc)  Date Value Ref Range Status  09/19/2022 88 mg/dL (calc) Final    Comment:    Reference range: <100 . Desirable range <100 mg/dL for primary prevention;   <70 mg/dL for patients with CHD or diabetic patients  with > or = 2 CHD risk factors. Marland Kitchen LDL-C is now calculated using the Martin-Hopkins  calculation, which is a validated novel method providing  better accuracy than the Friedewald equation in the  estimation of LDL-C.  Horald Pollen et al.  Lenox Ahr. 4627;035(00): 2061-2068  (http://education.[facility].com/faq/FAQ164)    HDL  Date Value Ref Range Status  09/19/2022 57 > OR = 50 mg/dL Final  93/81/8299 54 >37 mg/dL Final   Triglycerides  Date Value Ref Range Status  09/19/2022 90 <150 mg/dL Final         Passed - Patient is not pregnant       traMADol (ULTRAM) 50 MG tablet [Pharmacy Med Name: TRAMADOL HCL 50 MG TAB] 90 tablet 0    Sig: TAKE ONE (1) TABLET BY MOUTH 3 TIMES DAILY     Not Delegated - Analgesics:  Opioid Agonists Failed - 12/31/2022 11:05 AM      Failed - This refill cannot be delegated      Failed - Urine Drug Screen completed in last 360 days      Failed - Valid encounter within last 3 months    Recent Outpatient Visits           1 year ago Upper respiratory tract infection, unspecified type   [facility] Valentino Nose, NP   1 year ago Encounter for screening mammogram for malignant neoplasm of breast   [facility] Medicine Donita Brooks, MD   2 years ago Other osteoporosis, unspecified pathological  fracture presence   [facility] Tanya Nones, Priscille Heidelberg, MD   2 years ago Acute non-recurrent frontal sinusitis   [facility] Donita Brooks, MD   2 years ago Right low back pain, unspecified chronicity, unspecified whether sciatica present   [facility] Pickard, Priscille Heidelberg, MD       Future Appointments             In 2 months Pickard, Priscille Heidelberg, MD [facility], [facility]   In 7 months Bjorn Pippin, MD [facility]rology [place]

## 2023-01-01 NOTE — Telephone Encounter (Signed)
Requested medications are due for refill today.  Provider to determine  Requested medications are on the active medications list.  yes  Last refill. 10/06/2022 #90  Future visit scheduled.   yes  Notes to clinic.  Refill not delegated.    Requested Prescriptions  Pending Prescriptions Disp Refills   traMADol (ULTRAM) 50 MG tablet [Pharmacy Med Name: TRAMADOL HCL 50 MG TAB] 90 tablet 0    Sig: TAKE ONE (1) TABLET BY MOUTH 3 TIMES DAILY     Not Delegated - Analgesics:  Opioid Agonists Failed - 12/31/2022 11:05 AM      Failed - This refill cannot be delegated      Failed - Urine Drug Screen completed in last 360 days      Failed - Valid encounter within last 3 months    Recent Outpatient Visits           1 year ago Upper respiratory tract infection, unspecified type   [facility] Valentino Nose, NP   1 year ago Encounter for screening mammogram for malignant neoplasm of breast   [facility] Pickard, Priscille Heidelberg, MD   2 years ago Other osteoporosis, unspecified pathological fracture presence   [facility] Tanya Nones, Priscille Heidelberg, MD   2 years ago Acute non-recurrent frontal sinusitis   [facility] Donita Brooks, MD   2 years ago Right low back pain, unspecified chronicity, unspecified whether sciatica present   [facility] Pickard, Priscille Heidelberg, MD       Future Appointments             In 2 months Pickard, Priscille Heidelberg, MD [facility], [facility]   In 7 months Bjorn Pippin, MD [facility] [place]            Signed Prescriptions Disp Refills   pravastatin (PRAVACHOL) 40 MG tablet 90 tablet 0    Sig: TAKE ONE (1) TABLET BY MOUTH EVERY DAY     Cardiovascular:  Antilipid - Statins Failed - 12/31/2022 11:05 AM      Failed - Valid encounter within last 12 months    Recent Outpatient Visits           1 year ago Upper respiratory tract infection, unspecified type    [facility] Medicine Valentino Nose, NP   1 year ago Encounter for screening mammogram for malignant neoplasm of breast   [facility] Pickard, Priscille Heidelberg, MD   2 years ago Other osteoporosis, unspecified pathological fracture presence   [facility] Donita Brooks, MD   2 years ago Acute non-recurrent frontal sinusitis   [facility] Medicine Donita Brooks, MD   2 years ago Right low back pain, unspecified chronicity, unspecified whether sciatica present   [facility] Pickard, Priscille Heidelberg, MD       Future Appointments             In 2 months Pickard, Priscille Heidelberg, MD [place] [facility], [facility]   In 7 months Bjorn Pippin, MD [facility] Urology [place]            Failed - Lipid Panel in normal range within the last 12 months    Cholesterol, Total  Date Value Ref Range Status  10/19/2015 169 100 - 199 mg/dL Final   Cholesterol  Date Value Ref Range Status  09/19/2022 163 <200 mg/dL  Final   LDL Cholesterol (Calc)  Date Value Ref Range Status  09/19/2022 88 mg/dL (calc) Final    Comment:    Reference range: <100 . Desirable range <100 mg/dL for primary prevention;   <70 mg/dL for patients with CHD or diabetic patients  with > or = 2 CHD risk factors. Marland Kitchen LDL-C is now calculated using the Martin-Hopkins  calculation, which is a validated novel method providing  better accuracy than the Friedewald equation in the  estimation of LDL-C.  Horald Pollen et al. Lenox Ahr. 3235;573(22): 2061-2068  (http://education.[facility].com/faq/FAQ164)    HDL  Date Value Ref Range Status  09/19/2022 57 > OR = 50 mg/dL Final  02/54/2706 54 >23 mg/dL Final   Triglycerides  Date Value Ref Range Status  09/19/2022 90 <150 mg/dL Final         Passed - Patient is not pregnant

## 2023-01-05 DIAGNOSIS — H524 Presbyopia: Secondary | ICD-10-CM | POA: Diagnosis not present

## 2023-01-26 ENCOUNTER — Other Ambulatory Visit: Payer: Self-pay | Admitting: Family Medicine

## 2023-02-02 DIAGNOSIS — D485 Neoplasm of uncertain behavior of skin: Secondary | ICD-10-CM | POA: Diagnosis not present

## 2023-02-02 DIAGNOSIS — B009 Herpesviral infection, unspecified: Secondary | ICD-10-CM | POA: Diagnosis not present

## 2023-02-04 ENCOUNTER — Other Ambulatory Visit: Payer: Self-pay

## 2023-02-04 DIAGNOSIS — M810 Age-related osteoporosis without current pathological fracture: Secondary | ICD-10-CM

## 2023-02-11 ENCOUNTER — Telehealth: Payer: Self-pay

## 2023-02-11 NOTE — Telephone Encounter (Signed)
LVM for pt to call back to make an appt w/nurse for prolia injection. Pls make appt when pt call

## 2023-02-12 ENCOUNTER — Telehealth: Payer: Self-pay

## 2023-02-12 NOTE — Progress Notes (Signed)
   Care Guide Note  02/12/2023 Name: KAIULANI SETZLER MRN: [id number] DOB: January 13, 1946  Referred by: Donita Brooks, MD Reason for referral : Care Coordination Haskel Khan to schedule with Pharm d )   MONIC PLUMADORE is a 78 y.o. year old female who is a primary care patient of Donita Brooks, MD. LANAYSIA ASHABRANNER was referred to the pharmacist for assistance related to  osteoperosis .    An unsuccessful telephone outreach was attempted today to contact the patient who was referred to the pharmacy team for assistance with medication assistance. Additional attempts will be made to contact the patient.   Penne Lash, RMA Care Guide [facility]  [place], [place] [address] Direct Dial: [phone] [email or web address]

## 2023-02-13 NOTE — Progress Notes (Signed)
   Care Guide Note  02/13/2023 Name: Allison Freeman MRN: [id number] DOB: 1946/08/05  Referred by: Donita Brooks, MD Reason for referral : Care Coordination Haskel Khan to schedule with Pharm d )   Allison Freeman is a 77 y.o. year old female who is a primary care patient of Donita Brooks, MD. Allison Freeman was referred to the pharmacist for assistance related to  osteoperosis .    Successful contact was made with the patient to discuss pharmacy services including being ready for the pharmacist to call at least 5 minutes before the scheduled appointment time, to have medication bottles and any blood sugar or blood pressure readings ready for review. The patient agreed to meet with the pharmacist via with the pharmacist via telephone visit on (date/time).  03/04/2023  Allison Freeman, RMA Care Guide [facility]  [place], [place] [address] Direct Dial: [phone] [email or web address]

## 2023-03-02 ENCOUNTER — Other Ambulatory Visit: Payer: Self-pay | Admitting: Family Medicine

## 2023-03-03 ENCOUNTER — Other Ambulatory Visit: Payer: Self-pay | Admitting: Family Medicine

## 2023-03-03 NOTE — Telephone Encounter (Signed)
Requested medications are due for refill today.  yes  Requested medications are on the active medications list.  yes  Last refill. 01/01/2023 #90 0 rf  Future visit scheduled.   yes  Notes to clinic.  Refill not delegated.    Requested Prescriptions  Pending Prescriptions Disp Refills   traMADol (ULTRAM) 50 MG tablet [Pharmacy Med Name: TRAMADOL HCL 50 MG TAB] 90 tablet     Sig: TAKE ONE (1) TABLET BY MOUTH 3 TIMES DAILY     Not Delegated - Analgesics:  Opioid Agonists Failed - 03/02/2023  9:11 AM      Failed - This refill cannot be delegated      Failed - Urine Drug Screen completed in last 360 days      Failed - Valid encounter within last 3 months    Recent Outpatient Visits           1 year ago Upper respiratory tract infection, unspecified type   [facility] Valentino Nose, NP   1 year ago Encounter for screening mammogram for malignant neoplasm of breast   [facility] Tanya Nones, Priscille Heidelberg, MD   2 years ago Other osteoporosis, unspecified pathological fracture presence   [facility] Donita Brooks, MD   2 years ago Acute non-recurrent frontal sinusitis   [facility] Donita Brooks, MD   2 years ago Right low back pain, unspecified chronicity, unspecified whether sciatica present   [facility] Pickard, Priscille Heidelberg, MD       Future Appointments             In 2 weeks Pickard, Priscille Heidelberg, MD [place] [facility], [facility]   In 4 months Bjorn Pippin, MD [facility] [place]

## 2023-03-04 ENCOUNTER — Other Ambulatory Visit: Payer: Medicare HMO | Admitting: Pharmacist

## 2023-03-04 ENCOUNTER — Telehealth: Payer: Self-pay | Admitting: Pharmacist

## 2023-03-04 DIAGNOSIS — M810 Age-related osteoporosis without current pathological fracture: Secondary | ICD-10-CM

## 2023-03-04 NOTE — Telephone Encounter (Signed)
Hey Blia!  I do not think you guys have access to Prolia team and I'm not sure your workflow for this.  Patient has switched to [facility] so will likely need a PA stat if she is due in July.  I don't normally assist with this (very involved and really need to be onsite in the practice/amgen portal access/etc) & just route to Prolia team.  Patient is unaware of her copay as well. I will route to PA team to complete potential PA.  I'm not sure if you guys ship in from [facility] and do buy/bill or patient to bring in from specialty pharmacy and have administered.  It's a lot of money at stake if buy/bill isn't done correctly.  Let me know your process!  Patient due for DEXA in 04/2023  Thanks! Raynelle Fanning  PA team--can you run PA on prolia?

## 2023-03-05 ENCOUNTER — Ambulatory Visit (INDEPENDENT_AMBULATORY_CARE_PROVIDER_SITE_OTHER): Payer: Medicare HMO

## 2023-03-05 ENCOUNTER — Other Ambulatory Visit (HOSPITAL_COMMUNITY): Payer: Self-pay

## 2023-03-05 VITALS — Ht 65.0 in | Wt 169.0 lb

## 2023-03-05 DIAGNOSIS — Z1231 Encounter for screening mammogram for malignant neoplasm of breast: Secondary | ICD-10-CM

## 2023-03-05 DIAGNOSIS — Z780 Asymptomatic menopausal state: Secondary | ICD-10-CM

## 2023-03-05 DIAGNOSIS — Z13820 Encounter for screening for osteoporosis: Secondary | ICD-10-CM

## 2023-03-05 DIAGNOSIS — Z0000 Encounter for general adult medical examination without abnormal findings: Secondary | ICD-10-CM

## 2023-03-05 MED ORDER — TRAMADOL HCL 50 MG PO TABS: 50.0000 mg | ORAL_TABLET | Freq: Four times a day (QID) | ORAL | 0 refills | Status: DC | PRN

## 2023-03-05 NOTE — Telephone Encounter (Signed)
Pharmacy Patient Advocate Encounter   PA for Prolia submitted to [facility] via CoverMyMeds Key or (Medicaid) confirmation # [id number]  Status is pending

## 2023-03-05 NOTE — Telephone Encounter (Signed)
Approval letter in media

## 2023-03-05 NOTE — Progress Notes (Signed)
Subjective:   Allison Freeman is a 77 y.o. female who presents for Medicare Annual (Subsequent) preventive examination.  I connected with  Allison Freeman on 03/05/23 by a audio enabled telemedicine application and verified that I am speaking with the correct person using two identifiers.  Patient Location: Home  Provider Location: Home Office  I discussed the limitations of evaluation and management by telemedicine. The patient expressed understanding and agreed to proceed.  Review of Systems     Cardiac Risk Factors include: advanced age (>48men, >85 women);dyslipidemia;sedentary lifestyle     Objective:    Today's Vitals   03/05/23 1617  Weight: 169 lb (76.7 kg)  Height: 5\' 5"  (1.651 m)   Body mass index is 28.12 kg/m.     03/05/2023    4:20 PM 06/23/2022    9:09 AM 03/07/2021    8:33 AM 05/09/2019   11:20 AM 05/09/2019    6:25 AM 05/05/2019    3:35 PM 04/04/2015    9:17 AM  Advanced Directives  Does Patient Have a Medical Advance Directive? No No Yes Yes Yes Yes No  Type of Best boy of [place];Living will Healthcare Power of [place];Living will Healthcare Power of [place];Living will Healthcare Power of [place];Living will   Does patient want to make changes to medical advance directive?   No - Patient declined No - Patient declined  No - Patient declined   Copy of Healthcare Power of Attorney in Chart?   No - copy requested No - copy requested No - copy requested No - copy requested   Would patient like information on creating a medical advance directive? Yes (MAU/Ambulatory/Procedural Areas - Information given) No - Patient declined     No - patient declined information    Current Medications (verified) Outpatient Encounter Medications as of 03/05/2023  Medication Sig   ALPRAZolam (XANAX) 0.5 MG tablet TAKE ONE TABLET (0.5MG  TOTAL) BY MOUTH AT BEDTIME   Calcium-Magnesium-Vitamin D (CALCIUM 1200+D3 PO) Take 1 tablet by mouth daily.    Carboxymethylcellul-Glycerin (LUBRICATING EYE DROPS OP) Place 1 drop into both eyes 2 (two) times daily.   cyclobenzaprine (FLEXERIL) 10 MG tablet TAKE ONE TABLET (10MG  TOTAL) BY MOUTH THREE TIMES DAILY AS NEEDED FOR MUSCLE SPASMS   EYSUVIS 0.25 % SUSP Apply 1 drop to eye 2 (two) times daily.   fluticasone (FLONASE) 50 MCG/ACT nasal spray Use 2 sprays in each  nostril once daily (Patient taking differently: Place 2 sprays into both nostrils at bedtime.)   levocetirizine (XYZAL) 5 MG tablet Take 5 mg by mouth daily.   losartan (COZAAR) 100 MG tablet TAKE ONE TABLET (100MG  TOTAL) BY MOUTH DAILY   MELATONIN ER PO Take by mouth.   meloxicam (MOBIC) 15 MG tablet Take 15 mg by mouth daily.   Multiple Vitamin (MULTIVITAMIN) tablet Take 1 tablet by mouth daily.   Omega-3 Fatty Acids (FISH OIL) 1200 MG CAPS Take 1,200 mg by mouth 2 (two) times daily.    pantoprazole (PROTONIX) 40 MG tablet TAKE ONE TABLET TWICE DAILY BEFORE MEALS   pravastatin (PRAVACHOL) 40 MG tablet TAKE ONE (1) TABLET BY MOUTH EVERY DAY   rOPINIRole (REQUIP) 2 MG tablet Take 1 tablet (2 mg total) by mouth at bedtime.   traMADol (ULTRAM) 50 MG tablet Take 1 tablet (50 mg total) by mouth every 6 (six) hours as needed.   Vibegron (GEMTESA) 75 MG TABS Take 1 tablet (75 mg total) by mouth daily.   No facility-administered  encounter medications on file as of 03/05/2023.    Allergies (verified) Patient has no known allergies.   History: Past Medical History:  Diagnosis Date   Acid reflux    Arthritis    Depression    Family history of adverse reaction to anesthesia    Mother woke up and could hearduring surgery   Hypercholesterolemia    Hypertension    Insomnia    Osteoporosis    Panic attacks    panic attacks   Reflux    Restless leg syndrome    Past Surgical History:  Procedure Laterality Date   ABDOMINAL HYSTERECTOMY  2001   BLADDER SURGERY     BREAST BIOPSY  1987   BREAST CYST EXCISION  1998   CARPAL TUNNEL  RELEASE Right 04/06/2015   Procedure: CARPAL TUNNEL RELEASE;  Surgeon: Vickki Hearing, MD;  Location: [facility];  Service: Orthopedics;  Laterality: Right;   CARPAL TUNNEL RELEASE Left 06/30/2022   Procedure: LEFT CARPAL TUNNEL RELEASE;  Surgeon: Betha Loa, MD;  Location:  SURGERY CENTER;  Service: Orthopedics;  Laterality: Left;   COLONOSCOPY  09/19/2004   WUJ:[id number] hemorrhoids, otherwise normal rectum/Sigmoid diverticula.  Remainder of colon mucosa appeared normal   COLONOSCOPY WITH ESOPHAGOGASTRODUODENOSCOPY (EGD) N/A 06/24/2013   GNF:AOZHYQM polyp-removed. Small hiatal hernia. Abnormal gastric mucosa-query NSAID effect-s/p (chronic inactive gastritis, negative H. pylori)/TCS: Prominent internal hemorrhoids-likely source of hematochezia. Colonic diverticulosis. Colonic polyp-removed (tubular adenoma)   COLONOSCOPY WITH PROPOFOL N/A 11/01/2018   Procedure: COLONOSCOPY WITH PROPOFOL;  Surgeon: Corbin Ade, MD;  Location: [facility];  Service: Endoscopy;  Laterality: N/A;  7:30am   CYSTOCELE REPAIR  2010   DILATION AND CURETTAGE OF UTERUS     JOINT REPLACEMENT N/A    [employer] 03/02/2020   KNEE SURGERY Right 2003   arthroscopic left   RECTAL SURGERY     RECTOCELE REPAIR  2010   REPLACEMENT TOTAL KNEE Right    TONSILLECTOMY     TOTAL KNEE ARTHROPLASTY Right 05/09/2019   Procedure: TOTAL KNEE ARTHROPLASTY;  Surgeon: Dannielle Huh, MD;  Location: [facility];  Service: Orthopedics;  Laterality: Right;   Family History  Problem Relation Age of Onset   Cancer Mother        Breast   Hypertension Father    Heart attack Father    Cancer Sister        Breast   Cancer Sister        breast   Breast cancer Sister    Colon cancer Neg Hx    Social History   Socioeconomic History   Marital status: Married    Spouse name: Not on file   Number of children: Not on file   Years of education: Not on file   Highest education level: Not on file  Occupational History   Not on file   Tobacco Use   Smoking status: Never   Smokeless tobacco: Never  Vaping Use   Vaping Use: Never used  Substance and Sexual Activity   Alcohol use: No   Drug use: No   Sexual activity: Yes    Birth control/protection: Surgical  Other Topics Concern   Not on file  Social History Narrative   Not on file   Social Determinants of Health   Financial Resource Strain: Low Risk  (03/05/2023)   Overall Financial Resource Strain (CARDIA)    Difficulty of Paying Living Expenses: Not hard at all  Food Insecurity: No Food Insecurity (03/05/2023)   Hunger Vital  Sign    Worried About Programme researcher, broadcasting/film/video in the Last Year: Never true    Ran Out of Food in the Last Year: Never true  Transportation Needs: No Transportation Needs (03/05/2023)   PRAPARE - Administrator, Civil Service (Medical): No    Lack of Transportation (Non-Medical): No  Physical Activity: Insufficiently Active (03/05/2023)   Exercise Vital Sign    Days of Exercise per Week: 3 days    Minutes of Exercise per Session: 30 min  Stress: No Stress Concern Present (03/05/2023)   [employer] of Occupational Health - Occupational Stress Questionnaire    Feeling of Stress : Not at all  Social Connections: Socially Integrated (03/05/2023)   Social Connection and Isolation Panel [NHANES]    Frequency of Communication with Friends and Family: More than three times a week    Frequency of Social Gatherings with Friends and Family: Three times a week    Attends Religious Services: More than 4 times per year    Active Member of Clubs or Organizations: Yes    Attends Banker Meetings: 1 to 4 times per year    Marital Status: Married    Tobacco Counseling Counseling given: Not Answered   Clinical Intake:  Pre-visit preparation completed: Yes  Pain : No/denies pain  Diabetes: No  How often do you need to have someone help you when you read instructions, pamphlets, or other written materials from your  doctor or pharmacy?: 1 - Never  Diabetic?No   Interpreter Needed?: No  Information entered by :: Kandis Fantasia LPN   Activities of Daily Living    03/05/2023    4:20 PM 06/30/2022   12:28 PM  In your present state of health, do you have any difficulty performing the following activities:  Hearing? 0 0  Vision? 0 0  Difficulty concentrating or making decisions? 0 0  Walking or climbing stairs? 0 0  Dressing or bathing? 0 0  Doing errands, shopping? 0   Preparing Food and eating ? N   Using the Toilet? N   In the past six months, have you accidently leaked urine? N   Do you have problems with loss of bowel control? N   Managing your Medications? N   Managing your Finances? N   Housekeeping or managing your Housekeeping? N     Patient Care Team: Donita Brooks, MD as PCP - General (Family Medicine)  Indicate any recent Medical Services you may have received from other than [facility] providers in the past year (date may be approximate).     Assessment:   This is a routine wellness examination for Allison Freeman.  Hearing/Vision screen Hearing Screening - Comments:: Denies hearing difficulties   Vision Screening - Comments:: Wears rx glasses - up to date with routine eye exams with [facility]    Dietary issues and exercise activities discussed: Current Exercise Habits: Home exercise routine, Type of exercise: walking, Time (Minutes): 30, Frequency (Times/Week): 3, Weekly Exercise (Minutes/Week): 90, Intensity: Mild   Goals Addressed             This Visit's Progress    Remain active and independent        Depression Screen    03/05/2023    4:19 PM 03/17/2022    8:39 AM 03/07/2021    8:32 AM 03/05/2020    8:33 AM 09/23/2018    8:20 AM 10/01/2017   11:51 AM 09/03/2017   11:08 AM  PHQ 2/9  Scores  PHQ - 2 Score 0 0 0 0 0 0 0  PHQ- 9 Score  0  0       Fall Risk    03/05/2023    4:20 PM 03/17/2022    8:32 AM 03/07/2021    8:32 AM 03/05/2020    8:33 AM 09/23/2018     8:21 AM  Fall Risk   Falls in the past year? 0 0 0 0 0  Number falls in past yr: 0 0 0 0   Injury with Fall? 0 0 0 0   Risk for fall due to : No Fall Risks  No Fall Risks    Follow up Falls prevention discussed;Education provided;Falls evaluation completed  Falls evaluation completed Falls evaluation completed Falls evaluation completed    FALL RISK PREVENTION PERTAINING TO THE HOME:  Any stairs in or around the home? Yes  If so, are there any without handrails? No  Home free of loose throw rugs in walkways, pet beds, electrical cords, etc? Yes  Adequate lighting in your home to reduce risk of falls? Yes   ASSISTIVE DEVICES UTILIZED TO PREVENT FALLS:  Life alert? No  Use of a cane, walker or w/c? No  Grab bars in the bathroom? Yes  Shower chair or bench in shower? No  Elevated toilet seat or a handicapped toilet? Yes   TIMED UP AND GO:  Was the test performed? No . Telephonic visit   Cognitive Function:        03/05/2023    4:20 PM  6CIT Screen  What Year? 0 points  What month? 0 points  What time? 0 points  Count back from 20 0 points  Months in reverse 0 points  Repeat phrase 0 points  Total Score 0 points    Immunizations Immunization History  Administered Date(s) Administered   Fluad Quad(high Dose 65+) 08/07/2020, 06/11/2021   Influenza,inj,Quad PF,6+ Mos 06/23/2014, 06/27/2015, 06/27/2016   Influenza-Unspecified 06/30/2013, 07/14/2017, 07/06/2018   PFIZER(Purple Top)SARS-COV-2 Vaccination 10/13/2019, 11/03/2019   Pneumococcal Conjugate-13 06/23/2014   Pneumococcal Polysaccharide-23 01/28/2016   Zoster Recombinat (Shingrix) 11/27/2017, 03/01/2018    TDAP status: Due, Education has been provided regarding the importance of this vaccine. Advised may receive this vaccine at local pharmacy or Health Dept. Aware to provide a copy of the vaccination record if obtained from local pharmacy or Health Dept. Verbalized acceptance and understanding.  Pneumococcal  vaccine status: Up to date  Covid-19 vaccine status: Information provided on how to obtain vaccines.   Qualifies for Shingles Vaccine? Yes   Zostavax completed No   Shingrix Completed?: Yes  Screening Tests Health Maintenance  Topic Date Due   DTaP/Tdap/Td (1 - Tdap) Never done   COVID-19 Vaccine (3 - 2023-24 season) 05/23/2022   INFLUENZA VACCINE  04/23/2023   Medicare Annual Wellness (AWV)  03/04/2024   MAMMOGRAM  05/09/2024   Colonoscopy  11/01/2028   Pneumonia Vaccine 98+ Years old  Completed   DEXA SCAN  Completed   Hepatitis C Screening  Completed   Zoster Vaccines- Shingrix  Completed   HPV VACCINES  Aged Out    Health Maintenance  Health Maintenance Due  Topic Date Due   DTaP/Tdap/Td (1 - Tdap) Never done   COVID-19 Vaccine (3 - 2023-24 season) 05/23/2022    Colorectal cancer screening: Type of screening: Colonoscopy. Completed 11/01/18. Repeat every 10 years  Mammogram status: Completed 05/09/22. Repeat every year (ordered today)  Bone Density status: Completed 05/06/21. Results reflect: Bone density results: OSTEOPENIA.  Repeat every 2 years. (Ordered today)  Lung Cancer Screening: (Low Dose CT Chest recommended if Age 57-80 years, 30 pack-year currently smoking OR have quit w/in 15years.) does not qualify.   Lung Cancer Screening Referral: n/a  Additional Screening:  Hepatitis C Screening: does qualify; Completed 09/03/17  Vision Screening: Recommended annual ophthalmology exams for early detection of glaucoma and other disorders of the eye. Is the patient up to date with their annual eye exam?  Yes  Who is the provider or what is the name of the office in which the patient attends annual eye exams? [facility]  If pt is not established with a provider, would they like to be referred to a provider to establish care? No .   Dental Screening: Recommended annual dental exams for proper oral hygiene  Community Resource Referral / Chronic Care  Management: CRR required this visit?  No   CCM required this visit?  No      Plan:     I have personally reviewed and noted the following in the patient's chart:   Medical and social history Use of alcohol, tobacco or illicit drugs  Current medications and supplements including opioid prescriptions. Patient is currently taking opioid prescriptions. Information provided to patient regarding non-opioid alternatives. Patient advised to discuss non-opioid treatment plan with their provider. Functional ability and status Nutritional status Physical activity Advanced directives List of other physicians Hospitalizations, surgeries, and ER visits in previous 12 months Vitals Screenings to include cognitive, depression, and falls Referrals and appointments  In addition, I have reviewed and discussed with patient certain preventive protocols, quality metrics, and best practice recommendations. A written personalized care plan for preventive services as well as general preventive health recommendations were provided to patient.     Durwin Nora, [place]   1/61/0960   Due to this being a virtual visit, the after visit summary with patients personalized plan was offered to patient via mail or my-chart. Patient would like to access on my-chart  Nurse Notes: No concerns

## 2023-03-05 NOTE — Patient Instructions (Signed)
Allison Freeman , Thank you for taking time to come for your Medicare Wellness Visit. I appreciate your ongoing commitment to your health goals. Please review the following plan we discussed and let me know if I can assist you in the future.   These are the goals we discussed:  Goals      Remain active and independent        This is a list of the screening recommended for you and due dates:  Health Maintenance  Topic Date Due   DTaP/Tdap/Td vaccine (1 - Tdap) Never done   COVID-19 Vaccine (3 - 2023-24 season) 05/23/2022   Flu Shot  04/23/2023   Medicare Annual Wellness Visit  03/04/2024   Mammogram  05/09/2024   Colon Cancer Screening  11/01/2028   Pneumonia Vaccine  Completed   DEXA scan (bone density measurement)  Completed   Hepatitis C Screening  Completed   Zoster (Shingles) Vaccine  Completed   HPV Vaccine  Aged Out    Advanced directives: Information on Advanced Care Planning can be found at [facility] Advance Health Care Directives Advance Health Care Directives ([email or web address] Please bring a copy of your health care power of attorney and living will to the office to be added to your chart at your convenience.  Conditions/risks identified: Aim for 30 minutes of exercise or brisk walking, 6-8 glasses of water, and 5 servings of fruits and vegetables each day.  Next appointment: Follow up in one year for your annual wellness visit   The number for scheduling your mammogram and bone density at [facility] after 05/10/23 is [phone]  Preventive Care 65 Years and Older, Female Preventive care refers to lifestyle choices and visits with your health care provider that can promote health and wellness. What does preventive care include? A yearly physical exam. This is also called an annual well check. Dental exams once or twice a year. Routine eye exams. Ask your health care provider how often you should have your eyes checked. Personal lifestyle choices,  including: Daily care of your teeth and gums. Regular physical activity. Eating a healthy diet. Avoiding tobacco and drug use. Limiting alcohol use. Practicing safe sex. Taking low-dose aspirin every day. Taking vitamin and mineral supplements as recommended by your health care provider. What happens during an annual well check? The services and screenings done by your health care provider during your annual well check will depend on your age, overall health, lifestyle risk factors, and family history of disease. Counseling  Your health care provider may ask you questions about your: Alcohol use. Tobacco use. Drug use. Emotional well-being. Home and relationship well-being. Sexual activity. Eating habits. History of falls. Memory and ability to understand (cognition). Work and work Astronomer. Reproductive health. Screening  You may have the following tests or measurements: Height, weight, and BMI. Blood pressure. Lipid and cholesterol levels. These may be checked every 5 years, or more frequently if you are over 63 years old. Skin check. Lung cancer screening. You may have this screening every year starting at age 42 if you have a 30-pack-year history of smoking and currently smoke or have quit within the past 15 years. Fecal occult blood test (FOBT) of the stool. You may have this test every year starting at age 34. Flexible sigmoidoscopy or colonoscopy. You may have a sigmoidoscopy every 5 years or a colonoscopy every 10 years starting at age 13. Hepatitis C blood test. Hepatitis B blood test. Sexually transmitted disease (STD) testing. Diabetes  screening. This is done by checking your blood sugar (glucose) after you have not eaten for a while (fasting). You may have this done every 1-3 years. Bone density scan. This is done to screen for osteoporosis. You may have this done starting at age 89. Mammogram. This may be done every 1-2 years. Talk to your health care provider  about how often you should have regular mammograms. Talk with your health care provider about your test results, treatment options, and if necessary, the need for more tests. Vaccines  Your health care provider may recommend certain vaccines, such as: Influenza vaccine. This is recommended every year. Tetanus, diphtheria, and acellular pertussis (Tdap, Td) vaccine. You may need a Td booster every 10 years. Zoster vaccine. You may need this after age 39. Pneumococcal 13-valent conjugate (PCV13) vaccine. One dose is recommended after age 49. Pneumococcal polysaccharide (PPSV23) vaccine. One dose is recommended after age 53. Talk to your health care provider about which screenings and vaccines you need and how often you need them. This information is not intended to replace advice given to you by your health care provider. Make sure you discuss any questions you have with your health care provider. Document Released: 10/05/2015 Document Revised: 05/28/2016 Document Reviewed: 07/10/2015 Elsevier Interactive Patient Education  2017 [employer] Prevention in the Home Falls can cause injuries. They can happen to people of all ages. There are many things you can do to make your home safe and to help prevent falls. What can I do on the outside of my home? Regularly fix the edges of walkways and driveways and fix any cracks. Remove anything that might make you trip as you walk through a door, such as a raised step or threshold. Trim any bushes or trees on the path to your home. Use bright outdoor lighting. Clear any walking paths of anything that might make someone trip, such as rocks or tools. Regularly check to see if handrails are loose or broken. Make sure that both sides of any steps have handrails. Any raised decks and porches should have guardrails on the edges. Have any leaves, snow, or ice cleared regularly. Use sand or salt on walking paths during winter. Clean up any spills in  your garage right away. This includes oil or grease spills. What can I do in the bathroom? Use night lights. Install grab bars by the toilet and in the tub and shower. Do not use towel bars as grab bars. Use non-skid mats or decals in the tub or shower. If you need to sit down in the shower, use a plastic, non-slip stool. Keep the floor dry. Clean up any water that spills on the floor as soon as it happens. Remove soap buildup in the tub or shower regularly. Attach bath mats securely with double-sided non-slip rug tape. Do not have throw rugs and other things on the floor that can make you trip. What can I do in the bedroom? Use night lights. Make sure that you have a light by your bed that is easy to reach. Do not use any sheets or blankets that are too big for your bed. They should not hang down onto the floor. Have a firm chair that has side arms. You can use this for support while you get dressed. Do not have throw rugs and other things on the floor that can make you trip. What can I do in the kitchen? Clean up any spills right away. Avoid walking on wet floors. Keep  items that you use a lot in easy-to-reach places. If you need to reach something above you, use a strong step stool that has a grab bar. Keep electrical cords out of the way. Do not use floor polish or wax that makes floors slippery. If you must use wax, use non-skid floor wax. Do not have throw rugs and other things on the floor that can make you trip. What can I do with my stairs? Do not leave any items on the stairs. Make sure that there are handrails on both sides of the stairs and use them. Fix handrails that are broken or loose. Make sure that handrails are as long as the stairways. Check any carpeting to make sure that it is firmly attached to the stairs. Fix any carpet that is loose or worn. Avoid having throw rugs at the top or bottom of the stairs. If you do have throw rugs, attach them to the floor with carpet  tape. Make sure that you have a light switch at the top of the stairs and the bottom of the stairs. If you do not have them, ask someone to add them for you. What else can I do to help prevent falls? Wear shoes that: Do not have high heels. Have rubber bottoms. Are comfortable and fit you well. Are closed at the toe. Do not wear sandals. If you use a stepladder: Make sure that it is fully opened. Do not climb a closed stepladder. Make sure that both sides of the stepladder are locked into place. Ask someone to hold it for you, if possible. Clearly mark and make sure that you can see: Any grab bars or handrails. First and last steps. Where the edge of each step is. Use tools that help you move around (mobility aids) if they are needed. These include: Canes. Walkers. Scooters. Crutches. Turn on the lights when you go into a dark area. Replace any light bulbs as soon as they burn out. Set up your furniture so you have a clear path. Avoid moving your furniture around. If any of your floors are uneven, fix them. If there are any pets around you, be aware of where they are. Review your medicines with your doctor. Some medicines can make you feel dizzy. This can increase your chance of falling. Ask your doctor what other things that you can do to help prevent falls. This information is not intended to replace advice given to you by your health care provider. Make sure you discuss any questions you have with your health care provider. Document Released: 07/05/2009 Document Revised: 02/14/2016 Document Reviewed: 10/13/2014 Elsevier Interactive Patient Education  2017 [employer].

## 2023-03-05 NOTE — Telephone Encounter (Signed)
Prolia already approved I would just make sure patient knows copay if there is one! Routing to you as Lorain Childes

## 2023-03-09 ENCOUNTER — Telehealth: Payer: Self-pay

## 2023-03-09 NOTE — Telephone Encounter (Signed)
*  Primary  PA request received for Prolia 60MG /ML syringes  PA submitted to [facility] and has been APPROVED through 09/22/2023  Key: [id number]

## 2023-03-10 MED ORDER — DENOSUMAB 60 MG/ML ~~LOC~~ SOSY
60.0000 mg | PREFILLED_SYRINGE | SUBCUTANEOUS | 0 refills | Status: DC
Start: 2023-03-10 — End: 2023-12-22

## 2023-03-10 NOTE — Progress Notes (Signed)
   03/10/2023 Name: Allison Freeman MRN: [id number] DOB: 09/07/1946  Access to Prolia  Allison Freeman is a 77 y.o. year old female who presented for a telephone visit.    Medications Reviewed Today     Reviewed by Anthoney Harada, LPN (Licensed Practical Nurse) on 03/05/23 at 1618  Med List Status: <None>   Medication Order Taking? Sig Documenting Provider Last Dose Status Informant  ALPRAZolam (XANAX) 0.5 MG tablet [id number] Yes TAKE ONE TABLET (0.5MG  TOTAL) BY MOUTH AT BEDTIME Donita Brooks, MD Taking Active   Calcium-Magnesium-Vitamin D (CALCIUM 1200+D3 PO) [id number] Yes Take 1 tablet by mouth daily. [provider] Taking Active Self  Carboxymethylcellul-Glycerin (LUBRICATING EYE DROPS OP) [id number] Yes Place 1 drop into both eyes 2 (two) times daily. [provider] Taking Active Self  cyclobenzaprine (FLEXERIL) 10 MG tablet [id number] Yes TAKE ONE TABLET (10MG  TOTAL) BY MOUTH THREE TIMES DAILY AS NEEDED FOR MUSCLE SPASMS Donita Brooks, MD Taking Active   EYSUVIS 0.25 % SUSP [id number] Yes Apply 1 drop to eye 2 (two) times daily. [provider] Taking Active   fluticasone (FLONASE) 50 MCG/ACT nasal spray [id number] Yes Use 2 sprays in each  nostril once daily  Patient taking differently: Place 2 sprays into both nostrils at bedtime.   Merlyn Albert, MD Taking Active   levocetirizine (XYZAL) 5 MG tablet [id number] Yes Take 5 mg by mouth daily. [provider] Taking Active Self  losartan (COZAAR) 100 MG tablet [id number] Yes TAKE ONE TABLET (100MG  TOTAL) BY MOUTH DAILY Donita Brooks, MD Taking Active   MELATONIN ER PO [id number] Yes Take by mouth. [provider] Taking Active Self  meloxicam (MOBIC) 15 MG tablet [id number] Yes Take 15 mg by mouth daily. [provider] Taking Active   Multiple Vitamin (MULTIVITAMIN) tablet [id number] Yes Take 1 tablet by mouth daily. [provider] Taking Active Self   Omega-3 Fatty Acids (FISH OIL) 1200 MG CAPS [id number] Yes Take 1,200 mg by mouth 2 (two) times daily.  [provider] Taking Active Self  pantoprazole (PROTONIX) 40 MG tablet [id number] Yes TAKE ONE TABLET TWICE DAILY BEFORE MEALS Donita Brooks, MD Taking Active   pravastatin (PRAVACHOL) 40 MG tablet [id number] Yes TAKE ONE (1) TABLET BY MOUTH EVERY DAY Donita Brooks, MD Taking Active   rOPINIRole (REQUIP) 2 MG tablet [id number] Yes Take 1 tablet (2 mg total) by mouth at bedtime. Donita Brooks, MD Taking Active   traMADol Janean Sark) 50 MG tablet [id number] Yes Take 1 tablet (50 mg total) by mouth every 6 (six) hours as needed. Donita Brooks, MD Taking Active   Vibegron Marne) 75 MG TABS [id number] Yes Take 1 tablet (75 mg total) by mouth daily. Donita Brooks, MD Taking Active              Assessment/Plan:   -Continue Prolia (last injection 09/19/22)--added back to med list -Prolia sent to PA team for approval (approved until 08/2023--see below) -Last DEXA 05/06/2021--due this year after 05/07/2023 -Message sent to RN to address ordering/cost/appt for next Prolia injection -Continue weight bearing exercises as able -Continue VitD/calcium supplementation      Kieth Brightly, PharmD, BCACP Clinical Pharmacist, [facility]

## 2023-03-13 ENCOUNTER — Ambulatory Visit: Payer: Medicare HMO | Admitting: Family Medicine

## 2023-03-23 ENCOUNTER — Ambulatory Visit (INDEPENDENT_AMBULATORY_CARE_PROVIDER_SITE_OTHER): Payer: Medicare HMO | Admitting: Family Medicine

## 2023-03-23 ENCOUNTER — Ambulatory Visit: Payer: Medicare HMO

## 2023-03-23 VITALS — BP 130/78 | HR 91 | Temp 98.4°F | Ht 63.75 in | Wt 169.8 lb

## 2023-03-23 DIAGNOSIS — Z0000 Encounter for general adult medical examination without abnormal findings: Secondary | ICD-10-CM

## 2023-03-23 DIAGNOSIS — M810 Age-related osteoporosis without current pathological fracture: Secondary | ICD-10-CM

## 2023-03-23 DIAGNOSIS — Z0001 Encounter for general adult medical examination with abnormal findings: Secondary | ICD-10-CM

## 2023-03-23 DIAGNOSIS — I10 Essential (primary) hypertension: Secondary | ICD-10-CM

## 2023-03-23 LAB — COMPLETE METABOLIC PANEL WITH GFR
AG Ratio: 1.6 (calc) (ref 1.0–2.5)
ALT: 14 U/L (ref 6–29)
AST: 17 U/L (ref 10–35)
Albumin: 3.9 g/dL (ref 3.6–5.1)
Alkaline phosphatase (APISO): 40 U/L (ref 37–153)
BUN: 24 mg/dL (ref 7–25)
CO2: 30 mmol/L (ref 20–32)
Calcium: 9.4 mg/dL (ref 8.6–10.4)
Chloride: 107 mmol/L (ref 98–110)
Creat: 0.96 mg/dL (ref 0.60–1.00)
Globulin: 2.5 g/dL (calc) (ref 1.9–3.7)
Glucose, Bld: 94 mg/dL (ref 65–99)
Potassium: 4.5 mmol/L (ref 3.5–5.3)
Sodium: 141 mmol/L (ref 135–146)
Total Bilirubin: 1.1 mg/dL (ref 0.2–1.2)
Total Protein: 6.4 g/dL (ref 6.1–8.1)
eGFR: 62 mL/min/{1.73_m2} (ref >=60)

## 2023-03-23 LAB — LIPID PANEL
Cholesterol: 158 mg/dL (ref <200)
HDL: 59 mg/dL (ref >=50)
LDL Cholesterol (Calc): 83 mg/dL (calc)
Non-HDL Cholesterol (Calc): 99 mg/dL (calc) (ref <130)
Total CHOL/HDL Ratio: 2.7 (calc) (ref <5.0)
Triglycerides: 75 mg/dL (ref <150)

## 2023-03-23 LAB — CBC WITH DIFFERENTIAL/PLATELET
Absolute Monocytes: 434 cells/uL (ref 200–950)
Basophils Absolute: 31 cells/uL (ref 0–200)
Basophils Relative: 0.6 %
Eosinophils Absolute: 133 cells/uL (ref 15–500)
Eosinophils Relative: 2.6 %
HCT: 37.2 % (ref 35.0–45.0)
Hemoglobin: 12.5 g/dL (ref 11.7–15.5)
Lymphs Abs: 1632 cells/uL (ref 850–3900)
MCH: 30.3 pg (ref 27.0–33.0)
MCHC: 33.6 g/dL (ref 32.0–36.0)
MCV: 90.1 fL (ref 80.0–100.0)
MPV: 11 fL (ref 7.5–12.5)
Monocytes Relative: 8.5 %
Neutro Abs: 2871 cells/uL (ref 1500–7800)
Neutrophils Relative %: 56.3 %
Platelets: 226 10*3/uL (ref 140–400)
RBC: 4.13 10*6/uL (ref 3.80–5.10)
RDW: 12.3 % (ref 11.0–15.0)
Total Lymphocyte: 32 %
WBC: 5.1 10*3/uL (ref 3.8–10.8)

## 2023-03-23 MED ORDER — CYCLOBENZAPRINE HCL 10 MG PO TABS: ORAL_TABLET | ORAL | 3 refills | Status: DC

## 2023-03-23 MED ORDER — ROPINIROLE HCL 2 MG PO TABS: 2.0000 mg | ORAL_TABLET | Freq: Every day | ORAL | 3 refills | Status: DC

## 2023-03-23 MED ORDER — GABAPENTIN 100 MG PO CAPS
100.0000 mg | ORAL_CAPSULE | Freq: Three times a day (TID) | ORAL | 3 refills | Status: DC
Start: 2023-03-23 — End: 2023-08-31

## 2023-03-23 NOTE — Progress Notes (Signed)
Subjective:    Patient ID: Allison Freeman, female    DOB: [birth year], 77 y.o.   MRN: [id number]  Constipation    Patient is a very sweet 77 year old Caucasian female here today for complete physical exam.  Her mammogram was performed in August and is up-to-date.  She schedules this herself.  Her colonoscopy was performed in 2020 and was clear.  She does not require another.  Due to age she does not have to have a Pap smear.  She is due for Prolia injection today.  She is due for her next bone density next year.  She does report constipation.  She has been trying MiraLAX daily, Colace, Dulcolax, and Senokot without success.  She is trying to drink more water.  She also tries increasing physical activity without success.  She is taking fiber without success.  She also reports worsening restless leg.  She takes 2 g of Requip every night.  She states around 5:00 in the afternoon, she has been under control with desire to move due to pain and discomfort in her legs.  This keeps her from sleeping.  Reviewing her shot record her shots are up-to-date. Past Medical History:  Diagnosis Date   Acid reflux    Arthritis    Depression    Family history of adverse reaction to anesthesia    Mother woke up and could hearduring surgery   Hypercholesterolemia    Hypertension    Insomnia    Osteoporosis    Panic attacks    panic attacks   Reflux    Restless leg syndrome    Past Surgical History:  Procedure Laterality Date   ABDOMINAL HYSTERECTOMY  2001   BLADDER SURGERY     BREAST BIOPSY  1987   BREAST CYST EXCISION  1998   CARPAL TUNNEL RELEASE Right 04/06/2015   Procedure: CARPAL TUNNEL RELEASE;  Surgeon: Vickki Hearing, MD;  Location: [facility];  Service: Orthopedics;  Laterality: Right;   CARPAL TUNNEL RELEASE Left 06/30/2022   Procedure: LEFT CARPAL TUNNEL RELEASE;  Surgeon: Betha Loa, MD;  Location: [facility];  Service: Orthopedics;  Laterality: Left;   COLONOSCOPY   09/19/2004   WUJ:[id number] hemorrhoids, otherwise normal rectum/Sigmoid diverticula.  Remainder of colon mucosa appeared normal   COLONOSCOPY WITH ESOPHAGOGASTRODUODENOSCOPY (EGD) N/A 06/24/2013   GNF:AOZHYQM polyp-removed. Small hiatal hernia. Abnormal gastric mucosa-query NSAID effect-s/p (chronic inactive gastritis, negative H. pylori)/TCS: Prominent internal hemorrhoids-likely source of hematochezia. Colonic diverticulosis. Colonic polyp-removed (tubular adenoma)   COLONOSCOPY WITH PROPOFOL N/A 11/01/2018   Procedure: COLONOSCOPY WITH PROPOFOL;  Surgeon: Corbin Ade, MD;  Location: [facility];  Service: Endoscopy;  Laterality: N/A;  7:30am   CYSTOCELE REPAIR  2010   DILATION AND CURETTAGE OF UTERUS     JOINT REPLACEMENT N/A    Phreesia 03/02/2020   KNEE SURGERY Right 2003   arthroscopic left   RECTAL SURGERY     RECTOCELE REPAIR  2010   REPLACEMENT TOTAL KNEE Right    TONSILLECTOMY     TOTAL KNEE ARTHROPLASTY Right 05/09/2019   Procedure: TOTAL KNEE ARTHROPLASTY;  Surgeon: Dannielle Huh, MD;  Location: [facility];  Service: Orthopedics;  Laterality: Right;   Current Outpatient Medications on File Prior to Visit  Medication Sig Dispense Refill   ALPRAZolam (XANAX) 0.5 MG tablet TAKE ONE TABLET (0.5MG  TOTAL) BY MOUTH AT BEDTIME 30 tablet 2   Calcium-Magnesium-Vitamin D (CALCIUM 1200+D3 PO) Take 1 tablet by mouth daily.     Carboxymethylcellul-Glycerin (LUBRICATING  EYE DROPS OP) Place 1 drop into both eyes 2 (two) times daily.     denosumab (PROLIA) 60 MG/ML SOSY injection Inject 60 mg into the skin every 6 (six) months. Last injection 09/19/22 1 mL 0   EYSUVIS 0.25 % SUSP Apply 1 drop to eye 2 (two) times daily.     fluticasone (FLONASE) 50 MCG/ACT nasal spray Use 2 sprays in each  nostril once daily (Patient taking differently: Place 2 sprays into both nostrils at bedtime.) 48 g 3   levocetirizine (XYZAL) 5 MG tablet Take 5 mg by mouth daily.     losartan (COZAAR) 100 MG tablet TAKE ONE  TABLET (100MG  TOTAL) BY MOUTH DAILY 90 tablet 1   MELATONIN ER PO Take by mouth.     meloxicam (MOBIC) 15 MG tablet Take 15 mg by mouth daily.     Multiple Vitamin (MULTIVITAMIN) tablet Take 1 tablet by mouth daily.     Omega-3 Fatty Acids (FISH OIL) 1200 MG CAPS Take 1,200 mg by mouth 2 (two) times daily.      pantoprazole (PROTONIX) 40 MG tablet TAKE ONE TABLET TWICE DAILY BEFORE MEALS 180 tablet 0   pravastatin (PRAVACHOL) 40 MG tablet TAKE ONE (1) TABLET BY MOUTH EVERY DAY 90 tablet 0   traMADol (ULTRAM) 50 MG tablet Take 1 tablet (50 mg total) by mouth every 6 (six) hours as needed. 90 tablet 0   Vibegron (GEMTESA) 75 MG TABS Take 1 tablet (75 mg total) by mouth daily. 90 tablet 1   No current facility-administered medications on file prior to visit.   No Known Allergies  Social History   Socioeconomic History   Marital status: Married    Spouse name: Not on file   Number of children: Not on file   Years of education: Not on file   Highest education level: Not on file  Occupational History   Not on file  Tobacco Use   Smoking status: Never   Smokeless tobacco: Never  Vaping Use   Vaping Use: Never used  Substance and Sexual Activity   Alcohol use: No   Drug use: No   Sexual activity: Yes    Birth control/protection: Surgical  Other Topics Concern   Not on file  Social History Narrative   Not on file   Social Determinants of Health   Financial Resource Strain: Low Risk  (03/05/2023)   Overall Financial Resource Strain (CARDIA)    Difficulty of Paying Living Expenses: Not hard at all  Food Insecurity: No Food Insecurity (03/05/2023)   Hunger Vital Sign    Worried About Running Out of Food in the Last Year: Never true    Ran Out of Food in the Last Year: Never true  Transportation Needs: No Transportation Needs (03/05/2023)   PRAPARE - Administrator, Civil Service (Medical): No    Lack of Transportation (Non-Medical): No  Physical Activity:  Insufficiently Active (03/05/2023)   Exercise Vital Sign    Days of Exercise per Week: 3 days    Minutes of Exercise per Session: 30 min  Stress: No Stress Concern Present (03/05/2023)   [employer] of Occupational Health - Occupational Stress Questionnaire    Feeling of Stress : Not at all  Social Connections: Socially Integrated (03/05/2023)   Social Connection and Isolation Panel [NHANES]    Frequency of Communication with Friends and Family: More than three times a week    Frequency of Social Gatherings with Friends and Family: Three times a  week    Attends Religious Services: More than 4 times per year    Active Member of Clubs or Organizations: Yes    Attends Banker Meetings: 1 to 4 times per year    Marital Status: Married  Catering manager Violence: Not At Risk (03/05/2023)   Humiliation, Afraid, Rape, and Kick questionnaire    Fear of Current or Ex-Partner: No    Emotionally Abused: No    Physically Abused: No    Sexually Abused: No   Family History  Problem Relation Age of Onset   Cancer Mother        Breast   Hypertension Father    Heart attack Father    Cancer Sister        Breast   Cancer Sister        breast   Breast cancer Sister    Colon cancer Neg Hx      Review of Systems  Gastrointestinal:  Positive for constipation.  All other systems reviewed and are negative.      Objective:   Physical Exam Vitals reviewed.  Constitutional:      General: She is not in acute distress.    Appearance: Normal appearance. She is normal weight. She is not ill-appearing, toxic-appearing or diaphoretic.  HENT:     Head: Normocephalic and atraumatic.     Right Ear: Tympanic membrane and ear canal normal. There is no impacted cerumen.     Left Ear: Tympanic membrane and ear canal normal. There is no impacted cerumen.     Nose: Nose normal. No congestion or rhinorrhea.     Mouth/Throat:     Mouth: Mucous membranes are moist.     Pharynx: Oropharynx is  clear. No oropharyngeal exudate or posterior oropharyngeal erythema.  Eyes:     General:        Right eye: No discharge.        Left eye: No discharge.     Conjunctiva/sclera: Conjunctivae normal.     Pupils: Pupils are equal, round, and reactive to light.  Neck:     Vascular: No carotid bruit.  Cardiovascular:     Rate and Rhythm: Normal rate and regular rhythm.     Pulses: Normal pulses.     Heart sounds: Normal heart sounds. No murmur heard.    No friction rub. No gallop.  Pulmonary:     Effort: Pulmonary effort is normal. No respiratory distress.     Breath sounds: Normal breath sounds. No stridor. No wheezing, rhonchi or rales.  Abdominal:     General: Abdomen is flat. Bowel sounds are normal. There is no distension.     Palpations: Abdomen is soft. There is no mass.     Tenderness: There is no abdominal tenderness. There is no right CVA tenderness, guarding or rebound.     Hernia: No hernia is present.  Musculoskeletal:        General: Normal range of motion.     Cervical back: Normal range of motion and neck supple. No rigidity or tenderness.     Right lower leg: No edema.     Left lower leg: No edema.  Lymphadenopathy:     Cervical: No cervical adenopathy.  Skin:    General: Skin is warm.     Coloration: Skin is not jaundiced or pale.     Findings: No bruising, erythema, lesion or rash.  Neurological:     General: No focal deficit present.     Mental Status:  She is alert and oriented to person, place, and time. Mental status is at baseline.     Cranial Nerves: No cranial nerve deficit.     Sensory: No sensory deficit.     Motor: No weakness.     Coordination: Coordination normal.     Gait: Gait normal.     Deep Tendon Reflexes: Reflexes normal.          Assessment & Plan:  Encounter for Medicare annual wellness exam  Benign essential HTN - Plan: CBC with Differential/Platelet, Lipid panel, COMPLETE METABOLIC PANEL WITH GFR Her physical exam today is  completely normal.  Her blood pressure is excellent.  I did recommend an RSV shot this fall.  The remainder of her immunizations are up-to-date.  Her mammogram is due in August.  Colonoscopy is up-to-date and she does not require Pap smear.  I will schedule her bone density test next year.  We will try Linzess for constipation as she has tried and failed numerous over-the-counter medications.  We will try adding gabapentin 100 mg up to 3 times a day as needed.  Slight symptoms.  She will take this in addition to Requip.  We will uptitrate to 300 mg if necessary.

## 2023-03-26 ENCOUNTER — Encounter: Payer: Self-pay | Admitting: Family Medicine

## 2023-03-27 ENCOUNTER — Other Ambulatory Visit (INDEPENDENT_AMBULATORY_CARE_PROVIDER_SITE_OTHER): Payer: Medicare HMO

## 2023-03-27 DIAGNOSIS — M810 Age-related osteoporosis without current pathological fracture: Secondary | ICD-10-CM

## 2023-03-27 MED ORDER — DENOSUMAB 60 MG/ML ~~LOC~~ SOSY
60.00 mg | PREFILLED_SYRINGE | Freq: Once | SUBCUTANEOUS | Status: AC
Start: 2023-03-27 — End: 2023-03-23
  Administered 2023-03-23: 60 mg via SUBCUTANEOUS

## 2023-03-27 NOTE — Progress Notes (Addendum)
Prolia injection during 03/23/23 OV visit w/pcp

## 2023-03-27 NOTE — Progress Notes (Signed)
Prolia injection

## 2023-03-30 ENCOUNTER — Other Ambulatory Visit: Payer: Self-pay

## 2023-03-31 ENCOUNTER — Other Ambulatory Visit: Payer: Self-pay

## 2023-03-31 DIAGNOSIS — K5900 Constipation, unspecified: Secondary | ICD-10-CM

## 2023-03-31 MED ORDER — LINACLOTIDE 290 MCG PO CAPS
290.0000 ug | ORAL_CAPSULE | Freq: Every day | ORAL | 3 refills | Status: DC
Start: 2023-03-31 — End: 2023-09-29

## 2023-04-07 ENCOUNTER — Other Ambulatory Visit: Payer: Self-pay | Admitting: Family Medicine

## 2023-04-08 NOTE — Telephone Encounter (Signed)
Requested medication (s) are due for refill today: routing for review  Requested medication (s) are on the active medication list: yes  Last refill:  03/05/23  Future visit scheduled: no  Notes to clinic:  Unable to refill per protocol, cannot delegate.      Requested Prescriptions  Pending Prescriptions Disp Refills   traMADol (ULTRAM) 50 MG tablet [Pharmacy Med Name: TRAMADOL HCL 50 MG TAB] 90 tablet      Not Delegated - Analgesics:  Opioid Agonists Failed - 04/07/2023 10:18 AM      Failed - This refill cannot be delegated      Failed - Urine Drug Screen completed in last 360 days      Failed - Valid encounter within last 3 months    Recent Outpatient Visits           1 year ago Upper respiratory tract infection, unspecified type   [facility] Valentino Nose, NP   2 years ago Encounter for screening mammogram for malignant neoplasm of breast   [facility] Pickard, Priscille Heidelberg, MD   2 years ago Other osteoporosis, unspecified pathological fracture presence   [facility] Donita Brooks, MD   2 years ago Acute non-recurrent frontal sinusitis   [facility] Donita Brooks, MD   2 years ago Right low back pain, unspecified chronicity, unspecified whether sciatica present   [facility] Pickard, Priscille Heidelberg, MD       Future Appointments             In 3 months Bjorn Pippin, MD [facility] [place]            Signed Prescriptions Disp Refills   pravastatin (PRAVACHOL) 40 MG tablet 90 tablet 0    Sig: TAKE ONE (1) TABLET BY MOUTH EVERY DAY     Cardiovascular:  Antilipid - Statins Failed - 04/07/2023 10:18 AM      Failed - Valid encounter within last 12 months    Recent Outpatient Visits           1 year ago Upper respiratory tract infection, unspecified type   [facility] Valentino Nose, NP   2 years ago Encounter for screening mammogram for  malignant neoplasm of breast   [facility] Pickard, Priscille Heidelberg, MD   2 years ago Other osteoporosis, unspecified pathological fracture presence   [facility] Donita Brooks, MD   2 years ago Acute non-recurrent frontal sinusitis   [facility] Donita Brooks, MD   2 years ago Right low back pain, unspecified chronicity, unspecified whether sciatica present   [facility] Pickard, Priscille Heidelberg, MD       Future Appointments             In 3 months Bjorn Pippin, MD [facility] [place]            Failed - Lipid Panel in normal range within the last 12 months    Cholesterol, Total  Date Value Ref Range Status  10/19/2015 169 100 - 199 mg/dL Final   Cholesterol  Date Value Ref Range Status  03/23/2023 158 <200 mg/dL Final   LDL Cholesterol (Calc)  Date Value Ref Range Status  03/23/2023 83 mg/dL (calc) Final    Comment:    Reference range: <100 . Desirable range <100 mg/dL for primary prevention;   <70 mg/dL for  patients with CHD or diabetic patients  with > or = 2 CHD risk factors. Marland Kitchen LDL-C is now calculated using the Martin-Hopkins  calculation, which is a validated novel method providing  better accuracy than the Friedewald equation in the  estimation of LDL-C.  Horald Pollen et al. Lenox Ahr. 9604;540(98): 2061-2068  (http://education.[facility].com/faq/FAQ164)    HDL  Date Value Ref Range Status  03/23/2023 59 > OR = 50 mg/dL Final  11/91/4782 54 >95 mg/dL Final   Triglycerides  Date Value Ref Range Status  03/23/2023 75 <150 mg/dL Final         Passed - Patient is not pregnant

## 2023-04-08 NOTE — Telephone Encounter (Signed)
Last OV 03/23/23, within protocol.  Requested Prescriptions  Pending Prescriptions Disp Refills   traMADol (ULTRAM) 50 MG tablet [Pharmacy Med Name: TRAMADOL HCL 50 MG TAB] 90 tablet      Not Delegated - Analgesics:  Opioid Agonists Failed - 04/07/2023 10:18 AM      Failed - This refill cannot be delegated      Failed - Urine Drug Screen completed in last 360 days      Failed - Valid encounter within last 3 months    Recent Outpatient Visits           1 year ago Upper respiratory tract infection, unspecified type   [facility] Allison Nose, NP   2 years ago Encounter for screening mammogram for malignant neoplasm of breast   [facility] Freeman, Allison Heidelberg, MD   2 years ago Other osteoporosis, unspecified pathological fracture presence   [facility] Allison Brooks, MD   2 years ago Acute non-recurrent frontal sinusitis   [facility] Allison Brooks, MD   2 years ago Right low back pain, unspecified chronicity, unspecified whether sciatica present   [facility] Freeman, Allison Heidelberg, MD       Future Appointments             In 3 months Allison Pippin, MD [facility] [place]             pravastatin (PRAVACHOL) 40 MG tablet [Pharmacy Med Name: PRAVASTATIN SODIUM 40 MG TAB] 90 tablet 0    Sig: TAKE ONE (1) TABLET BY MOUTH EVERY DAY     Cardiovascular:  Antilipid - Statins Failed - 04/07/2023 10:18 AM      Failed - Valid encounter within last 12 months    Recent Outpatient Visits           1 year ago Upper respiratory tract infection, unspecified type   [facility] Allison Nose, NP   2 years ago Encounter for screening mammogram for malignant neoplasm of breast   [facility] Freeman, Allison Heidelberg, MD   2 years ago Other osteoporosis, unspecified pathological fracture presence   [facility] Allison Brooks, MD   2 years  ago Acute non-recurrent frontal sinusitis   [facility] Allison Brooks, MD   2 years ago Right low back pain, unspecified chronicity, unspecified whether sciatica present   [facility] Freeman, Allison Heidelberg, MD       Future Appointments             In 3 months Allison Pippin, MD [facility]             Failed - Lipid Panel in normal range within the last 12 months    Cholesterol, Total  Date Value Ref Range Status  10/19/2015 169 100 - 199 mg/dL Final   Cholesterol  Date Value Ref Range Status  03/23/2023 158 <200 mg/dL Final   LDL Cholesterol (Calc)  Date Value Ref Range Status  03/23/2023 83 mg/dL (calc) Final    Comment:    Reference range: <100 . Desirable range <100 mg/dL for primary prevention;   <70 mg/dL for patients with CHD or diabetic patients  with > or = 2 CHD risk factors. Marland Kitchen LDL-C is now calculated using the Martin-Hopkins  calculation, which is a validated novel method providing  better accuracy than the Friedewald equation in  the  estimation of LDL-C.  Allison Freeman et al. Lenox Ahr. 4010;272(53): 2061-2068  (http://education.[facility].com/faq/FAQ164)    HDL  Date Value Ref Range Status  03/23/2023 59 > OR = 50 mg/dL Final  66/44/0347 54 >42 mg/dL Final   Triglycerides  Date Value Ref Range Status  03/23/2023 75 <150 mg/dL Final         Passed - Patient is not pregnant

## 2023-04-09 ENCOUNTER — Other Ambulatory Visit: Payer: Self-pay | Admitting: Family Medicine

## 2023-04-09 ENCOUNTER — Encounter: Payer: Self-pay | Admitting: Family Medicine

## 2023-04-10 ENCOUNTER — Other Ambulatory Visit: Payer: Self-pay | Admitting: Family Medicine

## 2023-04-10 MED ORDER — TRAMADOL HCL 50 MG PO TABS: 50.0000 mg | ORAL_TABLET | Freq: Three times a day (TID) | ORAL | 0 refills | Status: DC | PRN

## 2023-04-23 ENCOUNTER — Other Ambulatory Visit: Payer: Self-pay | Admitting: Family Medicine

## 2023-04-24 ENCOUNTER — Other Ambulatory Visit (HOSPITAL_COMMUNITY): Payer: Self-pay

## 2023-04-24 NOTE — Telephone Encounter (Signed)
Requested Prescriptions  Pending Prescriptions Disp Refills   pantoprazole (PROTONIX) 40 MG tablet [Pharmacy Med Name: PANTOPRAZOLE SODIUM 40 MG DR TAB] 180 tablet 0    Sig: TAKE ONE TABLET TWICE DAILY BEFORE MEALS     Gastroenterology: Proton Pump Inhibitors Failed - 04/23/2023 10:23 AM      Failed - Valid encounter within last 12 months    Recent Outpatient Visits           1 year ago Upper respiratory tract infection, unspecified type   [facility] Valentino Nose, NP   2 years ago Encounter for screening mammogram for malignant neoplasm of breast   [facility] Pickard, Priscille Heidelberg, MD   2 years ago Other osteoporosis, unspecified pathological fracture presence   [facility] Donita Brooks, MD   2 years ago Acute non-recurrent frontal sinusitis   [facility] Donita Brooks, MD   2 years ago Right low back pain, unspecified chronicity, unspecified whether sciatica present   [facility] Pickard, Priscille Heidelberg, MD       Future Appointments             In 3 months Bjorn Pippin, MD [facility] [place]             Due to a glitch in the system the LOV is not detected for this practice.   The protocol indicates there is an office visit needed however she was seen on 03/23/2023 for her yearly physical.

## 2023-04-29 ENCOUNTER — Other Ambulatory Visit: Payer: Self-pay | Admitting: Family Medicine

## 2023-04-30 NOTE — Telephone Encounter (Signed)
Requested medications are due for refill today.  yes  Requested medications are on the active medications list.  yes  Last refill. 01/26/2023 #30 2 rf  Future visit scheduled.   no  Notes to clinic.  Refill not delegated.    Requested Prescriptions  Pending Prescriptions Disp Refills   ALPRAZolam (XANAX) 0.5 MG tablet [Pharmacy Med Name: ALPRAZOLAM 0.5 MG TAB] 30 tablet     Sig: TAKE ONE TABLET (0.5MG  TOTAL) BY MOUTH AT BEDTIME     Not Delegated - Psychiatry: Anxiolytics/Hypnotics 2 Failed - 04/29/2023 12:25 PM      Failed - This refill cannot be delegated      Failed - Urine Drug Screen completed in last 360 days      Failed - Valid encounter within last 6 months    Recent Outpatient Visits           1 year ago Upper respiratory tract infection, unspecified type   [facility] Medicine Valentino Nose, NP   2 years ago Encounter for screening mammogram for malignant neoplasm of breast   [facility] Pickard, Priscille Heidelberg, MD   2 years ago Other osteoporosis, unspecified pathological fracture presence   [facility] Medicine Donita Brooks, MD   2 years ago Acute non-recurrent frontal sinusitis   [facility] Donita Brooks, MD   2 years ago Right low back pain, unspecified chronicity, unspecified whether sciatica present   [facility] Pickard, Priscille Heidelberg, MD       Future Appointments             In 3 months Bjorn Pippin, MD [facility] - Patient is not pregnant

## 2023-05-01 ENCOUNTER — Encounter: Payer: Self-pay | Admitting: Family Medicine

## 2023-05-01 ENCOUNTER — Other Ambulatory Visit: Payer: Self-pay | Admitting: Family Medicine

## 2023-05-01 MED ORDER — ALPRAZOLAM 0.5 MG PO TABS: ORAL_TABLET | ORAL | 5 refills | Status: DC

## 2023-05-15 ENCOUNTER — Ambulatory Visit (HOSPITAL_COMMUNITY)
Admission: RE | Admit: 2023-05-15 | Discharge: 2023-05-15 | Disposition: A | Discharge status: Home / Self Care | Payer: Medicare HMO | Source: Ambulatory Visit | Attending: Family Medicine | Admitting: Family Medicine

## 2023-05-15 ENCOUNTER — Ambulatory Visit (HOSPITAL_COMMUNITY): Admission: RE | Admit: 2023-05-15 | Payer: Medicare HMO | Source: Ambulatory Visit

## 2023-05-15 DIAGNOSIS — Z13820 Encounter for screening for osteoporosis: Secondary | ICD-10-CM

## 2023-05-15 DIAGNOSIS — M810 Age-related osteoporosis without current pathological fracture: Secondary | ICD-10-CM | POA: Diagnosis not present

## 2023-05-15 DIAGNOSIS — Z1231 Encounter for screening mammogram for malignant neoplasm of breast: Secondary | ICD-10-CM | POA: Insufficient documentation

## 2023-05-15 DIAGNOSIS — Z780 Asymptomatic menopausal state: Secondary | ICD-10-CM | POA: Insufficient documentation

## 2023-05-19 ENCOUNTER — Other Ambulatory Visit: Payer: Self-pay | Admitting: Family Medicine

## 2023-05-19 DIAGNOSIS — I10 Essential (primary) hypertension: Secondary | ICD-10-CM

## 2023-05-20 NOTE — Telephone Encounter (Signed)
Due to a system glitch the system does not detect the last office visit for this practice correctly.   LOV 03/23/2023.  Requested Prescriptions  Pending Prescriptions Disp Refills   losartan (COZAAR) 100 MG tablet [Pharmacy Med Name: LOSARTAN POTASSIUM 100 MG TAB] 90 tablet 1    Sig: TAKE ONE TABLET (100 MG TOTAL) BY MOUTH DAILY.     Cardiovascular:  Angiotensin Receptor Blockers Failed - 05/19/2023  9:46 AM      Failed - Valid encounter within last 6 months    Recent Outpatient Visits           1 year ago Upper respiratory tract infection, unspecified type   [facility] Valentino Nose, NP   2 years ago Encounter for screening mammogram for malignant neoplasm of breast   [facility] Tanya Nones, Priscille Heidelberg, MD   2 years ago Other osteoporosis, unspecified pathological fracture presence   [facility] Medicine Donita Brooks, MD   2 years ago Acute non-recurrent frontal sinusitis   [facility] Medicine Donita Brooks, MD   3 years ago Right low back pain, unspecified chronicity, unspecified whether sciatica present   [facility] Donita Brooks, MD       Future Appointments             In 2 months Bjorn Pippin, MD [facility] Urology             Passed - Cr in normal range and within 180 days    Creat  Date Value Ref Range Status  03/23/2023 0.96 0.60 - 1.00 mg/dL Final         Passed - K in normal range and within 180 days    Potassium  Date Value Ref Range Status  03/23/2023 4.5 3.5 - 5.3 mmol/L Final         Passed - Patient is not pregnant      Passed - Last BP in normal range    BP Readings from Last 1 Encounters:  03/23/23 130/78

## 2023-05-21 DIAGNOSIS — Z96653 Presence of artificial knee joint, bilateral: Secondary | ICD-10-CM | POA: Diagnosis not present

## 2023-06-01 ENCOUNTER — Other Ambulatory Visit: Payer: Self-pay | Admitting: Family Medicine

## 2023-06-18 ENCOUNTER — Other Ambulatory Visit: Payer: Self-pay | Admitting: Family Medicine

## 2023-06-19 NOTE — Telephone Encounter (Signed)
Requested medication (s) are due for refill today: routing for approval  Requested medication (s) are on the active medication list: yes  Last refill:  06/23/22  Future visit scheduled: no  Notes to clinic:  Unable to refill per protocol, last refill by histroical provider.      Requested Prescriptions  Pending Prescriptions Disp Refills   meloxicam (MOBIC) 15 MG tablet [Pharmacy Med Name: MELOXICAM 15 MG TAB] 90 tablet     Sig: TAKE ONE (1) TABLET BY MOUTH EVERY DAY     Analgesics:  COX2 Inhibitors Failed - 06/18/2023  3:42 PM      Failed - Manual Review: Labs are only required if the patient has taken medication for more than 8 weeks.      Failed - Valid encounter within last 12 months    Recent Outpatient Visits           1 year ago Upper respiratory tract infection, unspecified type   [facility] Medicine Valentino Nose, NP   2 years ago Encounter for screening mammogram for malignant neoplasm of breast   [facility] Tanya Nones, Priscille Heidelberg, MD   2 years ago Other osteoporosis, unspecified pathological fracture presence   [facility] Medicine Donita Brooks, MD   2 years ago Acute non-recurrent frontal sinusitis   [facility] Medicine Donita Brooks, MD   3 years ago Right low back pain, unspecified chronicity, unspecified whether sciatica present   [facility] Pickard, Priscille Heidelberg, MD       Future Appointments             In 1 month Bjorn Pippin, MD [facility] [place]            Passed - HGB in normal range and within 360 days    Hemoglobin  Date Value Ref Range Status  03/23/2023 12.5 11.7 - 15.5 g/dL Final         Passed - Cr in normal range and within 360 days    Creat  Date Value Ref Range Status  03/23/2023 0.96 0.60 - 1.00 mg/dL Final         Passed - HCT in normal range and within 360 days    HCT  Date Value Ref Range Status  03/23/2023 37.2 35.0 - 45.0 % Final          Passed - AST in normal range and within 360 days    AST  Date Value Ref Range Status  03/23/2023 17 10 - 35 U/L Final         Passed - ALT in normal range and within 360 days    ALT  Date Value Ref Range Status  03/23/2023 14 6 - 29 U/L Final         Passed - eGFR is 30 or above and within 360 days    GFR, Est African American  Date Value Ref Range Status  03/07/2021 75 > OR = 60 mL/min/1.58m2 Final   GFR, Est Non African American  Date Value Ref Range Status  03/07/2021 65 > OR = 60 mL/min/1.61m2 Final   eGFR  Date Value Ref Range Status  03/23/2023 62 > OR = 60 mL/min/1.67m2 Final         Passed - Patient is not pregnant

## 2023-07-06 ENCOUNTER — Other Ambulatory Visit: Payer: Self-pay | Admitting: Family Medicine

## 2023-07-07 NOTE — Telephone Encounter (Signed)
Due to a system glitch the last office visit for this practice is not detected correctly.   LOV 03/23/2023.   Labs in date, criteria met.  Requested Prescriptions  Pending Prescriptions Disp Refills   pravastatin (PRAVACHOL) 40 MG tablet [Pharmacy Med Name: PRAVASTATIN SODIUM 40 MG TAB] 90 tablet 3    Sig: TAKE ONE (1) TABLET BY MOUTH EVERY DAY     Cardiovascular:  Antilipid - Statins Failed - 07/06/2023  9:28 AM      Failed - Valid encounter within last 12 months    Recent Outpatient Visits           1 year ago Upper respiratory tract infection, unspecified type   [facility] Medicine Valentino Nose, NP   2 years ago Encounter for screening mammogram for malignant neoplasm of breast   [facility] Medicine Pickard, Priscille Heidelberg, MD   2 years ago Other osteoporosis, unspecified pathological fracture presence   [facility] Donita Brooks, MD   3 years ago Acute non-recurrent frontal sinusitis   [facility] Donita Brooks, MD   3 years ago Right low back pain, unspecified chronicity, unspecified whether sciatica present   [facility] Pickard, Priscille Heidelberg, MD       Future Appointments             In 3 weeks Bjorn Pippin, MD [facility] Urology [place]            Failed - Lipid Panel in normal range within the last 12 months    Cholesterol, Total  Date Value Ref Range Status  10/19/2015 169 100 - 199 mg/dL Final   Cholesterol  Date Value Ref Range Status  03/23/2023 158 <200 mg/dL Final   LDL Cholesterol (Calc)  Date Value Ref Range Status  03/23/2023 83 mg/dL (calc) Final    Comment:    Reference range: <100 . Desirable range <100 mg/dL for primary prevention;   <70 mg/dL for patients with CHD or diabetic patients  with > or = 2 CHD risk factors. Marland Kitchen LDL-C is now calculated using the Martin-Hopkins  calculation, which is a validated novel method providing  better accuracy than the Friedewald  equation in the  estimation of LDL-C.  Horald Pollen et al. Lenox Ahr. 7829;562(13): 2061-2068  (http://education.[employer].com/faq/FAQ164)    HDL  Date Value Ref Range Status  03/23/2023 59 > OR = 50 mg/dL Final  08/65/7846 54 >96 mg/dL Final   Triglycerides  Date Value Ref Range Status  03/23/2023 75 <150 mg/dL Final         Passed - Patient is not pregnant

## 2023-07-10 ENCOUNTER — Encounter (INDEPENDENT_AMBULATORY_CARE_PROVIDER_SITE_OTHER): Payer: Self-pay

## 2023-07-13 ENCOUNTER — Encounter (INDEPENDENT_AMBULATORY_CARE_PROVIDER_SITE_OTHER): Payer: Self-pay

## 2023-07-13 ENCOUNTER — Ambulatory Visit (INDEPENDENT_AMBULATORY_CARE_PROVIDER_SITE_OTHER): Payer: Medicare HMO | Admitting: Otolaryngology

## 2023-07-13 VITALS — Ht 65.0 in | Wt 169.0 lb

## 2023-07-13 DIAGNOSIS — T162XXA Foreign body in left ear, initial encounter: Secondary | ICD-10-CM

## 2023-07-13 DIAGNOSIS — H6981 Other specified disorders of Eustachian tube, right ear: Secondary | ICD-10-CM | POA: Diagnosis not present

## 2023-07-13 DIAGNOSIS — H7201 Central perforation of tympanic membrane, right ear: Secondary | ICD-10-CM

## 2023-07-14 DIAGNOSIS — H7201 Central perforation of tympanic membrane, right ear: Secondary | ICD-10-CM | POA: Insufficient documentation

## 2023-07-14 DIAGNOSIS — H6981 Other specified disorders of Eustachian tube, right ear: Secondary | ICD-10-CM | POA: Insufficient documentation

## 2023-07-14 DIAGNOSIS — T162XXA Foreign body in left ear, initial encounter: Secondary | ICD-10-CM | POA: Insufficient documentation

## 2023-07-14 NOTE — Progress Notes (Unsigned)
Patient ID: Allison Freeman, female   DOB: 1946-06-12, 77 y.o.   MRN: [id number]  Follow up: Right middle ear effusion and eustachian tube dysfunction New complaint: Left ear pain, left ear drainage  HPI: The patient is a 77 year old female who presents today with a new complaint of left ear pain and left ear drainage.  The patient was previously seen for right ear eustachian tube dysfunction and right middle ear effusion.  She was treated with right ventilating tube placement.  According to the patient, she has not had any difficulty with the right ear.  However, over the past month, she has noted intermittent left ear pain and left ear drainage.  She has a history of environmental allergies.  She is currently using Nasonex daily.  She also has a history of bilateral hearing loss.  She wears bilateral hearing aids.  Exam: The patient is well nourished and well developed. Eyes: PERRL, EOMI. No scleral icterus, conjunctivae clear. Neuro: CN II exam reveals vision grossly intact. No nystagmus at any point of gaze. Ears: The right ventilating tube is in place and patent. No drainage is noted.  A foreign body (a hearing aid dome) is noted to be impacted within the medial aspect of the left ear canal.  Nose: External evaluation reveals normal support and skin without lesions. Dorsum is intact. Anterior rhinoscopy reveals congested and edematous mucosa over anterior aspect of the inferior turbinates and nasal septum. No purulence is noted. Oral:  Oral cavity and oropharynx are intact, symmetric, without erythema or edema. Mucosa is moist without lesions. Neck: Full range of motion without pain. There is no significant lymphadenopathy. No masses palpable. Thyroid bed within normal limits to palpation. Parotid glands and submandibular glands equal bilaterally without mass. Trachea is midline. Neuro:  CN 2-12 grossly intact. Gait normal. Vestibular: No nystagmus at any point of gaze.   Procedure: Left ear foreign  body removal.   Anesthesia: None Description of the procedure: The patient is placed on a supine position.  Under the operating microscope, the left ear canal is examined.  A hearing aid dome is noted to be impacted on the medial aspect of the ear canal.  Under the microscope, the foreign body is carefully removed with an alligator forceps and a 90 hook.  After foreign body removal, the tympanic membrane and middle ear space are noted to be normal.  The patient tolerated the procedure well.  Assessment: 1.  The right ear ventilating tube is in place and patent.  No drainage is noted. 2.  Right ear eustachian tube dysfunction. 3.  A foreign body is noted to be impacted within the medial aspect of the left ear canal.  Plan: 1.  The physical exam findings are reviewed with the patient. 2.  Otomicroscopy with left ear canal foreign body removal. 3.  The patient is reassured that no infection is noted today. 4.  The patient will return for reevaluation in 6 months.   Plan:

## 2023-07-26 ENCOUNTER — Encounter: Payer: Self-pay | Admitting: Family Medicine

## 2023-07-30 ENCOUNTER — Ambulatory Visit: Payer: Medicare HMO | Admitting: Urology

## 2023-08-11 ENCOUNTER — Other Ambulatory Visit: Payer: Self-pay | Admitting: Family Medicine

## 2023-08-12 NOTE — Telephone Encounter (Signed)
Last OV 03/23/23 Requested Prescriptions  Pending Prescriptions Disp Refills   pantoprazole (PROTONIX) 40 MG tablet [Pharmacy Med Name: PANTOPRAZOLE SODIUM 40 MG DR TAB] 180 tablet 0    Sig: TAKE ONE TABLET TWICE DAILY BEFORE MEALS     Gastroenterology: Proton Pump Inhibitors Failed - 08/11/2023  9:11 AM      Failed - Valid encounter within last 12 months    Recent Outpatient Visits           1 year ago Upper respiratory tract infection, unspecified type   [facility] Medicine Valentino Nose, NP   2 years ago Encounter for screening mammogram for malignant neoplasm of breast   [facility] Medicine Pickard, Priscille Heidelberg, MD   2 years ago Other osteoporosis, unspecified pathological fracture presence   [facility] Medicine Donita Brooks, MD   3 years ago Acute non-recurrent frontal sinusitis   [facility] Medicine Donita Brooks, MD   3 years ago Right low back pain, unspecified chronicity, unspecified whether sciatica present   [facility] Pickard, Priscille Heidelberg, MD

## 2023-08-28 ENCOUNTER — Other Ambulatory Visit: Payer: Self-pay | Admitting: Family Medicine

## 2023-08-31 NOTE — Telephone Encounter (Signed)
Requested Prescriptions  Pending Prescriptions Disp Refills   gabapentin (NEURONTIN) 100 MG capsule [Pharmacy Med Name: GABAPENTIN 100 MG CAP] 90 capsule 3    Sig: TAKE ONE CAPSULE (100MG  TOTAL) BY MOUTH THREE TIMES DAILY     Neurology: Anticonvulsants - gabapentin Failed - 08/28/2023  1:30 PM      Failed - Valid encounter within last 12 months    Recent Outpatient Visits           2 years ago Upper respiratory tract infection, unspecified type   [facility] Medicine Valentino Nose, NP   2 years ago Encounter for screening mammogram for malignant neoplasm of breast   [facility] Medicine Pickard, Priscille Heidelberg, MD   2 years ago Other osteoporosis, unspecified pathological fracture presence   [facility] Medicine Donita Brooks, MD   3 years ago Acute non-recurrent frontal sinusitis   [facility] Medicine Donita Brooks, MD   3 years ago Right low back pain, unspecified chronicity, unspecified whether sciatica present   [facility] Medicine Pickard, Priscille Heidelberg, MD              Passed - Cr in normal range and within 360 days    Creat  Date Value Ref Range Status  03/23/2023 0.96 0.60 - 1.00 mg/dL Final         Passed - Completed PHQ-2 or PHQ-9 in the last 360 days

## 2023-09-07 ENCOUNTER — Encounter: Payer: Self-pay | Admitting: Family Medicine

## 2023-09-07 ENCOUNTER — Ambulatory Visit (INDEPENDENT_AMBULATORY_CARE_PROVIDER_SITE_OTHER): Payer: Medicare HMO | Admitting: Family Medicine

## 2023-09-07 VITALS — BP 130/82 | HR 85 | Temp 97.8°F | Ht 65.0 in | Wt 171.0 lb

## 2023-09-07 DIAGNOSIS — M12812 Other specific arthropathies, not elsewhere classified, left shoulder: Secondary | ICD-10-CM | POA: Diagnosis not present

## 2023-09-07 DIAGNOSIS — Z23 Encounter for immunization: Secondary | ICD-10-CM | POA: Diagnosis not present

## 2023-09-07 NOTE — Addendum Note (Signed)
Addended by: Arta Silence on: 09/07/2023 12:03 PM   Modules accepted: Orders

## 2023-09-07 NOTE — Assessment & Plan Note (Signed)
Positive painful arm, empty can, and lift-off tests. No s/s of effusion or joint infection. She would like to proceed with joint injection today. This was performed without complications. Discussed risks vs benefits. Will obtain shoulder x-ray as well.

## 2023-09-07 NOTE — Progress Notes (Signed)
Subjective:  HPI: Allison Freeman is a 77 y.o. female presenting on 09/07/2023 for Follow-up (Prolia and shoulder pain on the L-side, been hurting for over a year however its getting worst, can't sleep on left sid.)   HPI Patient is in today for chronic left shoulder pain over her deltoid that is worsening and making it difficult to sleep on that side. Denies weakness, numbness, tingling. ROM is limited with flexion and extension due to pain. She does not recall any injury or precipitating event, she previously worked as a [employer] administrator. No history of back or cervical pain, pain does not radiate. No swelling, redness, or warmth. She has tried Tramadol without relief and would like to start with conservative measures.  Joint Injection/Arthrocentesis  Date/Time: 09/07/2023 10:50 AM  Performed by: Park Meo, FNP Authorized by: Park Meo, FNP  Indications: pain  Body area: shoulder Joint: left subacromial bursa Local anesthesia used: yes  Anesthesia: Local anesthesia used: yes Local Anesthetic: topical anesthetic  Sedation: Patient sedated: no  Preparation: Patient was prepped and draped in the usual sterile fashion. Needle size: 22 G Ultrasound guidance: no Approach: posterior Aspirate: clear Aspirate amount: 0.5 mL Triamcinolone amount: 80 mg Bupivacaine 0.25% amount: 2 mL Lidocaine 1% amount: 2 mL Patient tolerance: patient tolerated the procedure well with no immediate complications      Review of Systems  All other systems reviewed and are negative.   Relevant past medical history reviewed and updated as indicated.   Past Medical History:  Diagnosis Date   Acid reflux    Arthritis    Depression    Family history of adverse reaction to anesthesia    Mother woke up and could hearduring surgery   Hypercholesterolemia    Hypertension    Insomnia    Osteoporosis    Panic attacks    panic attacks   Reflux    Restless leg syndrome       Past Surgical History:  Procedure Laterality Date   ABDOMINAL HYSTERECTOMY  2001   BLADDER SURGERY     BREAST BIOPSY  1987   BREAST CYST EXCISION  1998   CARPAL TUNNEL RELEASE Right 04/06/2015   Procedure: CARPAL TUNNEL RELEASE;  Surgeon: Vickki Hearing, MD;  Location: [facility];  Service: Orthopedics;  Laterality: Right;   CARPAL TUNNEL RELEASE Left 06/30/2022   Procedure: LEFT CARPAL TUNNEL RELEASE;  Surgeon: Betha Loa, MD;  Location: [facility];  Service: Orthopedics;  Laterality: Left;   COLONOSCOPY  09/19/2004   HYQ:MVHQIONG hemorrhoids, otherwise normal rectum/Sigmoid diverticula.  Remainder of colon mucosa appeared normal   COLONOSCOPY WITH ESOPHAGOGASTRODUODENOSCOPY (EGD) N/A 06/24/2013   EXB:MWUXLKG polyp-removed. Small hiatal hernia. Abnormal gastric mucosa-query NSAID effect-s/p (chronic inactive gastritis, negative H. pylori)/TCS: Prominent internal hemorrhoids-likely source of hematochezia. Colonic diverticulosis. Colonic polyp-removed (tubular adenoma)   COLONOSCOPY WITH PROPOFOL N/A 11/01/2018   Procedure: COLONOSCOPY WITH PROPOFOL;  Surgeon: Corbin Ade, MD;  Location: [facility];  Service: Endoscopy;  Laterality: N/A;  7:30am   CYSTOCELE REPAIR  2010   DILATION AND CURETTAGE OF UTERUS     JOINT REPLACEMENT N/A    [employer] 03/02/2020   KNEE SURGERY Right 2003   arthroscopic left   RECTAL SURGERY     RECTOCELE REPAIR  2010   REPLACEMENT TOTAL KNEE Right    TONSILLECTOMY     TOTAL KNEE ARTHROPLASTY Right 05/09/2019   Procedure: TOTAL KNEE ARTHROPLASTY;  Surgeon: Dannielle Huh, MD;  Location: [facility];  Service: Orthopedics;  Laterality: Right;    Allergies and medications reviewed and updated.   Current Outpatient Medications:    acyclovir (ZOVIRAX) 400 MG tablet, Take 400 mg by mouth 3 (three) times daily., Disp: , Rfl:    ALPRAZolam (XANAX) 0.5 MG tablet, TAKE ONE TABLET (0.5MG  TOTAL) BY MOUTH AT BEDTIME, Disp: 30 tablet, Rfl: 5    Calcium-Magnesium-Vitamin D (CALCIUM 1200+D3 PO), Take 1 tablet by mouth daily., Disp: , Rfl:    Carboxymethylcellul-Glycerin (LUBRICATING EYE DROPS OP), Place 1 drop into both eyes 2 (two) times daily., Disp: , Rfl:    cyclobenzaprine (FLEXERIL) 10 MG tablet, TAKE ONE TABLET (10MG  TOTAL) BY MOUTH THREE TIMES DAILY AS NEEDED FOR MUSCLE SPASMS, Disp: 90 tablet, Rfl: 3   denosumab (PROLIA) 60 MG/ML SOSY injection, Inject 60 mg into the skin every 6 (six) months. Last injection 09/19/22, Disp: 1 mL, Rfl: 0   EYSUVIS 0.25 % SUSP, Apply 1 drop to eye 2 (two) times daily., Disp: , Rfl:    fluorometholone (FML) 0.1 % ophthalmic suspension, SMARTSIG:In Eye(s), Disp: , Rfl:    fluticasone (FLONASE) 50 MCG/ACT nasal spray, Use 2 sprays in each  nostril once daily (Patient taking differently: Place 2 sprays into both nostrils at bedtime.), Disp: 48 g, Rfl: 3   gabapentin (NEURONTIN) 100 MG capsule, TAKE ONE CAPSULE (100MG  TOTAL) BY MOUTH THREE TIMES DAILY, Disp: 30 capsule, Rfl: 0   levocetirizine (XYZAL) 5 MG tablet, Take 5 mg by mouth daily., Disp: , Rfl:    linaclotide (LINZESS) 290 MCG CAPS capsule, Take 1 capsule (290 mcg total) by mouth daily before breakfast., Disp: 30 capsule, Rfl: 3   losartan (COZAAR) 100 MG tablet, TAKE ONE TABLET (100 MG TOTAL) BY MOUTH DAILY., Disp: 90 tablet, Rfl: 1   MELATONIN ER PO, Take by mouth., Disp: , Rfl:    meloxicam (MOBIC) 15 MG tablet, Take 15 mg by mouth daily., Disp: , Rfl:    Multiple Vitamin (MULTIVITAMIN) tablet, Take 1 tablet by mouth daily., Disp: , Rfl:    Omega-3 Fatty Acids (FISH OIL) 1200 MG CAPS, Take 1,200 mg by mouth 2 (two) times daily. , Disp: , Rfl:    pantoprazole (PROTONIX) 40 MG tablet, TAKE ONE TABLET TWICE DAILY BEFORE MEALS, Disp: 180 tablet, Rfl: 1   pravastatin (PRAVACHOL) 40 MG tablet, TAKE ONE (1) TABLET BY MOUTH EVERY DAY, Disp: 90 tablet, Rfl: 3   rOPINIRole (REQUIP) 2 MG tablet, Take 1 tablet (2 mg total) by mouth at bedtime., Disp: 90  tablet, Rfl: 3   traMADol (ULTRAM) 50 MG tablet, Take 1 tablet (50 mg total) by mouth every 6 (six) hours as needed., Disp: 90 tablet, Rfl: 0   Vibegron (GEMTESA) 75 MG TABS, Take 1 tablet (75 mg total) by mouth daily., Disp: 90 tablet, Rfl: 1  No Known Allergies  Objective:   BP 130/82   Pulse 85   Temp 97.8 F (36.6 C) (Oral)   Ht 5\' 5"  (1.651 m)   Wt 171 lb (77.6 kg)   SpO2 97%   BMI 28.46 kg/m      09/07/2023   10:05 AM 07/13/2023    2:34 PM 03/23/2023    8:28 AM  Vitals with BMI  Height 5\' 5"  5\' 5"  5' 3.75"  Weight 171 lbs 169 lbs 169 lbs 13 oz  BMI 28.46 28.12 29.38  Systolic 130  130  Diastolic 82  78  Pulse 85  91     Physical Exam Vitals and nursing note  reviewed.  Constitutional:      Appearance: Normal appearance. She is normal weight.  HENT:     Head: Normocephalic and atraumatic.  Musculoskeletal:     Right shoulder: Normal.     Left shoulder: Tenderness and crepitus present. No swelling, effusion or bony tenderness. Decreased range of motion. Decreased strength.  Skin:    General: Skin is warm and dry.  Neurological:     General: No focal deficit present.     Mental Status: She is alert and oriented to person, place, and time. Mental status is at baseline.  Psychiatric:        Mood and Affect: Mood normal.        Behavior: Behavior normal.        Thought Content: Thought content normal.        Judgment: Judgment normal.     Assessment & Plan:  Rotator cuff arthropathy of left shoulder Assessment & Plan: Positive painful arm, empty can, and lift-off tests. No s/s of effusion or joint infection. She would like to proceed with joint injection today. This was performed without complications. Discussed risks vs benefits. Will obtain shoulder x-ray as well.  Orders: -     DG Shoulder Left; Future  Other orders -     Arthrocentesis     Follow up plan: Return if symptoms worsen or fail to improve.  Park Meo, FNP

## 2023-09-08 ENCOUNTER — Ambulatory Visit (HOSPITAL_COMMUNITY)
Admission: RE | Admit: 2023-09-08 | Discharge: 2023-09-08 | Disposition: A | Discharge status: Home / Self Care | Payer: Medicare HMO | Source: Ambulatory Visit | Attending: Family Medicine | Admitting: Family Medicine

## 2023-09-08 DIAGNOSIS — M25512 Pain in left shoulder: Secondary | ICD-10-CM | POA: Diagnosis not present

## 2023-09-08 DIAGNOSIS — M12812 Other specific arthropathies, not elsewhere classified, left shoulder: Secondary | ICD-10-CM | POA: Insufficient documentation

## 2023-09-08 DIAGNOSIS — M19012 Primary osteoarthritis, left shoulder: Secondary | ICD-10-CM | POA: Diagnosis not present

## 2023-09-10 MED ORDER — TRIAMCINOLONE ACETONIDE 40 MG/ML IJ SUSP
80.0000 mg | Freq: Once | INTRAMUSCULAR | Status: AC
  Administered 2023-09-07: 80 mg via INTRA_ARTICULAR

## 2023-09-10 NOTE — Addendum Note (Signed)
Addended by: Arta Silence on: 09/10/2023 02:28 PM   Modules accepted: Orders

## 2023-09-17 ENCOUNTER — Other Ambulatory Visit: Payer: Self-pay | Admitting: Family Medicine

## 2023-09-24 ENCOUNTER — Ambulatory Visit: Payer: Medicare HMO

## 2023-09-29 ENCOUNTER — Other Ambulatory Visit: Payer: Self-pay | Admitting: Family Medicine

## 2023-09-29 DIAGNOSIS — K5900 Constipation, unspecified: Secondary | ICD-10-CM

## 2023-10-06 ENCOUNTER — Other Ambulatory Visit: Payer: Self-pay | Admitting: Family Medicine

## 2023-10-07 NOTE — Telephone Encounter (Signed)
 Requested medication (s) are due for refill today: yes  Requested medication (s) are on the active medication list: yes  Last refill:  09/24/23  Future visit scheduled: yes  Notes to clinic:  refill was for short supply, should patient continue to take? Routing for review.     Requested Prescriptions  Pending Prescriptions Disp Refills   gabapentin  (NEURONTIN ) 100 MG capsule [Pharmacy Med Name: GABAPENTIN  100 MG CAP] 30 capsule 0    Sig: TAKE ONE CAPSULE (100MG  TOTAL) BY MOUTH THREE TIMES DAILY     Neurology: Anticonvulsants - gabapentin  Failed - 10/07/2023 10:08 AM      Failed - Valid encounter within last 12 months    Recent Outpatient Visits           2 years ago Upper respiratory tract infection, unspecified type   [facility] Medicine Wilhemena Harbour, NP   2 years ago Encounter for screening mammogram for malignant neoplasm of breast   [facility] Medicine Pickard, Cisco Crest, MD   3 years ago Other osteoporosis, unspecified pathological fracture presence   [facility] Medicine Austine Lefort, MD   3 years ago Acute non-recurrent frontal sinusitis   [facility] Medicine Austine Lefort, MD   3 years ago Right low back pain, unspecified chronicity, unspecified whether sciatica present   [facility] Medicine Pickard, Cisco Crest, MD              Passed - Cr in normal range and within 360 days    Creat  Date Value Ref Range Status  03/23/2023 0.96 0.60 - 1.00 mg/dL Final         Passed - Completed PHQ-2 or PHQ-9 in the last 360 days

## 2023-10-07 NOTE — Telephone Encounter (Signed)
 Requested medication (s) are due for refill today: yes  Requested medication (s) are on the active medication list: yes  Last refill:  09/29/23  Future visit scheduled: no  Notes to clinic:  short supply given, should patient continue to take.     Requested Prescriptions  Pending Prescriptions Disp Refills   gabapentin  (NEURONTIN ) 100 MG capsule [Pharmacy Med Name: GABAPENTIN  100 MG CAP] 30 capsule 0    Sig: TAKE ONE CAPSULE (100MG  TOTAL) BY MOUTH THREE TIMES DAILY     Neurology: Anticonvulsants - gabapentin  Failed - 10/07/2023 10:20 AM      Failed - Valid encounter within last 12 months    Recent Outpatient Visits           2 years ago Upper respiratory tract infection, unspecified type   [facility] Wilhemena Harbour, NP   2 years ago Encounter for screening mammogram for malignant neoplasm of breast   [facility] Pickard, Cisco Crest, MD   3 years ago Other osteoporosis, unspecified pathological fracture presence   [facility] Austine Lefort, MD   3 years ago Acute non-recurrent frontal sinusitis   [facility] Austine Lefort, MD   3 years ago Right low back pain, unspecified chronicity, unspecified whether sciatica present   [facility] Pickard, Cisco Crest, MD              Passed - Cr in normal range and within 360 days    Creat  Date Value Ref Range Status  03/23/2023 0.96 0.60 - 1.00 mg/dL Final         Passed - Completed PHQ-2 or PHQ-9 in the last 360 days

## 2023-10-13 ENCOUNTER — Ambulatory Visit (INDEPENDENT_AMBULATORY_CARE_PROVIDER_SITE_OTHER): Payer: Medicare HMO | Admitting: Family Medicine

## 2023-10-13 ENCOUNTER — Other Ambulatory Visit: Payer: Self-pay

## 2023-10-13 VITALS — BP 136/64 | HR 65 | Temp 97.6°F | Ht 65.0 in | Wt 175.0 lb

## 2023-10-13 DIAGNOSIS — M19012 Primary osteoarthritis, left shoulder: Secondary | ICD-10-CM | POA: Diagnosis not present

## 2023-10-13 DIAGNOSIS — J0110 Acute frontal sinusitis, unspecified: Secondary | ICD-10-CM

## 2023-10-13 DIAGNOSIS — K219 Gastro-esophageal reflux disease without esophagitis: Secondary | ICD-10-CM | POA: Diagnosis not present

## 2023-10-13 DIAGNOSIS — R079 Chest pain, unspecified: Secondary | ICD-10-CM | POA: Diagnosis not present

## 2023-10-13 DIAGNOSIS — M810 Age-related osteoporosis without current pathological fracture: Secondary | ICD-10-CM | POA: Diagnosis not present

## 2023-10-13 DIAGNOSIS — M12812 Other specific arthropathies, not elsewhere classified, left shoulder: Secondary | ICD-10-CM

## 2023-10-13 DIAGNOSIS — M7061 Trochanteric bursitis, right hip: Secondary | ICD-10-CM

## 2023-10-13 MED ORDER — FAMOTIDINE 40 MG PO TABS: 40.0000 mg | ORAL_TABLET | Freq: Every day | ORAL | 1 refills | Status: DC

## 2023-10-13 MED ORDER — AMOXICILLIN 875 MG PO TABS: 875.0000 mg | ORAL_TABLET | Freq: Two times a day (BID) | ORAL | 0 refills | Status: AC

## 2023-10-13 MED ORDER — GABAPENTIN 100 MG PO CAPS: 100.0000 mg | ORAL_CAPSULE | Freq: Three times a day (TID) | ORAL | 3 refills | Status: DC

## 2023-10-13 NOTE — Progress Notes (Signed)
Subjective:    Patient ID: Allison Freeman, female    DOB: [birth year], 78 y.o.   MRN: [id number]  Sore Throat   Shoulder Pain   Heartburn She complains of heartburn.  Sinus Problem   Patient presents today with several concerns.  First she has osteoporosis and is on Prolia due to severe acid reflux.  She cannot tolerate Fosamax.  However now, the patient has to have labs to monitor her calcium and vitamin D before we can administer Prolia.  Therefore we are getting these blood test.  Second she also complains of pain in her left shoulder.  An x-ray showed moderate degenerative joint disease.  The patient had a subacromial bursa cortisone injection 2 months ago that provided only minimal relief.  However she has no pain with abduction greater than 90 degrees.  She has full abduction of the shoulder without pain.  She has a negative empty can sign.  She does have pain with Hawkins maneuver.  However she has no pain with internal or external rotation.  I suspect that this is more likely arthritis in the shoulder.  Therefore I believe that she would benefit from a cortisone injection into the glenohumeral space.  However I did not perform that injection so I recommended that we get her into see orthopedics for this.  Third she also complains of severe breakthrough acid reflux for the last week.  She is on Protonix 40 mg twice daily.  However she continues to have wet burps, indigestion, burning sensation in her chest.  Fourth, the patient reports 1 week history of sinus pressure and sinus pain in her frontal sinuses.  She also complains of pain in her left ear and occasional dizziness.  She is taking Flonase with no relief. Past Medical History:  Diagnosis Date   Acid reflux    Arthritis    Depression    Family history of adverse reaction to anesthesia    Mother woke up and could hearduring surgery   Hypercholesterolemia    Hypertension    Insomnia    Osteoporosis    Panic attacks    panic  attacks   Reflux    Restless leg syndrome    Past Surgical History:  Procedure Laterality Date   ABDOMINAL HYSTERECTOMY  2001   BLADDER SURGERY     BREAST BIOPSY  1987   BREAST CYST EXCISION  1998   CARPAL TUNNEL RELEASE Right 04/06/2015   Procedure: CARPAL TUNNEL RELEASE;  Surgeon: Vickki Hearing, MD;  Location: [facility];  Service: Orthopedics;  Laterality: Right;   CARPAL TUNNEL RELEASE Left 06/30/2022   Procedure: LEFT CARPAL TUNNEL RELEASE;  Surgeon: Betha Loa, MD;  Location:  SURGERY CENTER;  Service: Orthopedics;  Laterality: Left;   COLONOSCOPY  09/19/2004   [id number] hemorrhoids, otherwise normal rectum/Sigmoid diverticula.  Remainder of colon mucosa appeared normal   COLONOSCOPY WITH ESOPHAGOGASTRODUODENOSCOPY (EGD) N/A 06/24/2013   WUX:[id number] polyp-removed. Small hiatal hernia. Abnormal gastric mucosa-query NSAID effect-s/p (chronic inactive gastritis, negative H. pylori)/TCS: Prominent internal hemorrhoids-likely source of hematochezia. Colonic diverticulosis. Colonic polyp-removed (tubular adenoma)   COLONOSCOPY WITH PROPOFOL N/A 11/01/2018   Procedure: COLONOSCOPY WITH PROPOFOL;  Surgeon: Corbin Ade, MD;  [facility]: [facility];  Service: Endoscopy;  Laterality: N/A;  7:30am   CYSTOCELE REPAIR  2010   DILATION AND CURETTAGE OF UTERUS     JOINT REPLACEMENT N/A    Phreesia 03/02/2020   KNEE SURGERY Right 2003   arthroscopic left   RECTAL  SURGERY     RECTOCELE REPAIR  2010   REPLACEMENT TOTAL KNEE Right    TONSILLECTOMY     TOTAL KNEE ARTHROPLASTY Right 05/09/2019   Procedure: TOTAL KNEE ARTHROPLASTY;  Surgeon: Dannielle Huh, MD;  Location: [facility];  Service: Orthopedics;  Laterality: Right;   Current Outpatient Medications on File Prior to Visit  Medication Sig Dispense Refill   acyclovir (ZOVIRAX) 400 MG tablet Take 400 mg by mouth 3 (three) times daily.     ALPRAZolam (XANAX) 0.5 MG tablet TAKE ONE TABLET (0.5MG  TOTAL) BY MOUTH AT BEDTIME 30 tablet 5    Calcium-Magnesium-Vitamin D (CALCIUM 1200+D3 PO) Take 1 tablet by mouth daily.     Carboxymethylcellul-Glycerin (LUBRICATING EYE DROPS OP) Place 1 drop into both eyes 2 (two) times daily.     cyclobenzaprine (FLEXERIL) 10 MG tablet TAKE ONE TABLET (10MG  TOTAL) BY MOUTH THREE TIMES DAILY AS NEEDED FOR MUSCLE SPASMS 90 tablet 3   denosumab (PROLIA) 60 MG/ML SOSY injection Inject 60 mg into the skin every 6 (six) months. Last injection 09/19/22 1 mL 0   EYSUVIS 0.25 % SUSP Apply 1 drop to eye 2 (two) times daily.     fluorometholone (FML) 0.1 % ophthalmic suspension SMARTSIG:In Eye(s)     fluticasone (FLONASE) 50 MCG/ACT nasal spray Use 2 sprays in each  nostril once daily (Patient taking differently: Place 2 sprays into both nostrils at bedtime.) 48 g 3   gabapentin (NEURONTIN) 100 MG capsule TAKE ONE CAPSULE (100MG  TOTAL) BY MOUTH THREE TIMES DAILY 30 capsule 0   levocetirizine (XYZAL) 5 MG tablet Take 5 mg by mouth daily.     LINZESS 290 MCG CAPS capsule TAKE ONE CAPSULE ( TOTAL) BY MOUTHDAILY BEFORE BREAKFAST 30 capsule 3   losartan (COZAAR) 100 MG tablet TAKE ONE TABLET (100 MG TOTAL) BY MOUTH DAILY. 90 tablet 1   MELATONIN ER PO Take by mouth.     meloxicam (MOBIC) 15 MG tablet Take 15 mg by mouth daily.     Multiple Vitamin (MULTIVITAMIN) tablet Take 1 tablet by mouth daily.     Omega-3 Fatty Acids (FISH OIL) 1200 MG CAPS Take 1,200 mg by mouth 2 (two) times daily.      pantoprazole (PROTONIX) 40 MG tablet TAKE ONE TABLET TWICE DAILY BEFORE MEALS 180 tablet 1   pravastatin (PRAVACHOL) 40 MG tablet TAKE ONE (1) TABLET BY MOUTH EVERY DAY 90 tablet 3   rOPINIRole (REQUIP) 2 MG tablet Take 1 tablet (2 mg total) by mouth at bedtime. 90 tablet 3   traMADol (ULTRAM) 50 MG tablet Take 1 tablet (50 mg total) by mouth every 6 (six) hours as needed. 90 tablet 0   Vibegron (GEMTESA) 75 MG TABS Take 1 tablet (75 mg total) by mouth daily. 90 tablet 1   No current facility-administered medications  on file prior to visit.   No Known Allergies  Social History   Socioeconomic History   Marital status: Married    Spouse name: Not on file   Number of children: Not on file   Years of education: Not on file   Highest education level: Not on file  Occupational History   Not on file  Tobacco Use   Smoking status: Never   Smokeless tobacco: Never  Vaping Use   Vaping status: Never Used  Substance and Sexual Activity   Alcohol use: No   Drug use: No   Sexual activity: Yes    Birth control/protection: Surgical  Other Topics Concern  Not on file  Social History Narrative   Not on file   Social Drivers of Health   Financial Resource Strain: Low Risk  (03/05/2023)   Overall Financial Resource Strain (CARDIA)    Difficulty of Paying Living Expenses: Not hard at all  Food Insecurity: No Food Insecurity (03/05/2023)   Hunger Vital Sign    Worried About Running Out of Food in the Last Year: Never true    Ran Out of Food in the Last Year: Never true  Transportation Needs: No Transportation Needs (03/05/2023)   PRAPARE - Administrator, Civil Service (Medical): No    Lack of Transportation (Non-Medical): No  Physical Activity: Insufficiently Active (03/05/2023)   Exercise Vital Sign    Days of Exercise per Week: 3 days    Minutes of Exercise per Session: 30 min  Stress: No Stress Concern Present (03/05/2023)   [employer] of Occupational Health - Occupational Stress Questionnaire    Feeling of Stress : Not at all  Social Connections: Socially Integrated (03/05/2023)   Social Connection and Isolation Panel [NHANES]    Frequency of Communication with Friends and Family: More than three times a week    Frequency of Social Gatherings with Friends and Family: Three times a week    Attends Religious Services: More than 4 times per year    Active Member of Clubs or Organizations: Yes    Attends Banker Meetings: 1 to 4 times per year    Marital Status:  Married  Catering manager Violence: Not At Risk (03/05/2023)   Humiliation, Afraid, Rape, and Kick questionnaire    Fear of Current or Ex-Partner: No    Emotionally Abused: No    Physically Abused: No    Sexually Abused: No      Review of Systems  Gastrointestinal:  Positive for heartburn.  All other systems reviewed and are negative.      Objective:   Physical Exam Vitals reviewed.  Constitutional:      Appearance: She is well-developed.  HENT:     Right Ear: Tympanic membrane and ear canal normal. No middle ear effusion. Tympanic membrane is not erythematous.     Left Ear: Tympanic membrane and ear canal normal.  No middle ear effusion. Tympanic membrane is not erythematous.     Nose: Congestion present.     Right Sinus: Frontal sinus tenderness present.     Left Sinus: Frontal sinus tenderness present.     Mouth/Throat:     Pharynx: No oropharyngeal exudate, posterior oropharyngeal erythema or uvula swelling.     Tonsils: No tonsillar abscesses.  Cardiovascular:     Rate and Rhythm: Normal rate and regular rhythm.     Heart sounds: Normal heart sounds. No murmur heard. Pulmonary:     Effort: Pulmonary effort is normal. No respiratory distress.     Breath sounds: Normal breath sounds. No wheezing or rales.  Abdominal:     General: Bowel sounds are normal. There is no distension.     Palpations: Abdomen is soft.     Tenderness: There is no abdominal tenderness. There is no guarding or rebound.  Musculoskeletal:     Left shoulder: Tenderness and crepitus present. Decreased range of motion.     Cervical back: Neck supple.  Lymphadenopathy:     Cervical: No cervical adenopathy.           Assessment & Plan:  Osteoporosis, unspecified osteoporosis type, unspecified pathological fracture presence - Plan: BASIC METABOLIC PANEL  WITH GFR, VITAMIN D 25 Hydroxy (Vit-D Deficiency, Fractures)  Glenohumeral arthritis, left  Acute non-recurrent frontal sinusitis  Chest  pain due to GERD First, regarding her osteoporosis I will check a BMP and a vitamin D level.  Assuming her calcium and vitamin D are normal, we will administer Prolia.  Second, I believe the patient's left shoulder pain is glenohumeral arthritis rather than subacromial bursitis.  I recommend an orthopedic consultation for intra-articular cortisone injection.  Third, I believe the patient is dealing with sinusitis.  I recommended adding Claritin 10 mg a day to her Flonase.  If not improving over the next 3 to 5 days, we will add amoxicillin 875 mg twice daily for 10 days.  Fourth, the patient is having refractory GERD despite taking Protonix 40 mg twice daily.  Will add Pepcid 40 mg daily.  If not improving on Pepcid, I would recommend EGD for further evaluation

## 2023-10-14 LAB — BASIC METABOLIC PANEL WITH GFR
BUN/Creatinine Ratio: 31 (calc) — ABNORMAL HIGH (ref 6–22)
BUN: 27 mg/dL — ABNORMAL HIGH (ref 7–25)
CO2: 26 mmol/L (ref 20–32)
Calcium: 9.6 mg/dL (ref 8.6–10.4)
Chloride: 103 mmol/L (ref 98–110)
Creat: 0.88 mg/dL (ref 0.60–1.00)
Glucose, Bld: 91 mg/dL (ref 65–99)
Potassium: 4.2 mmol/L (ref 3.5–5.3)
Sodium: 139 mmol/L (ref 135–146)
eGFR: 68 mL/min/{1.73_m2} (ref >=60)

## 2023-10-14 LAB — VITAMIN D 25 HYDROXY (VIT D DEFICIENCY, FRACTURES): Vit D, 25-Hydroxy: 57 ng/mL (ref 30–100)

## 2023-10-19 ENCOUNTER — Other Ambulatory Visit: Payer: Self-pay

## 2023-10-19 ENCOUNTER — Telehealth: Payer: Self-pay

## 2023-10-19 DIAGNOSIS — M810 Age-related osteoporosis without current pathological fracture: Secondary | ICD-10-CM

## 2023-10-19 NOTE — Telephone Encounter (Signed)
Copied from CRM [id number]. Topic: Appointments - Scheduling Inquiry for Clinic >> Oct 19, 2023  9:44 AM Elle L wrote: Reason for CRM: The patient needs to schedule her prolia injection. Please give her a call back at [phone].

## 2023-10-20 ENCOUNTER — Ambulatory Visit: Payer: Medicare HMO | Admitting: Orthopedic Surgery

## 2023-10-20 ENCOUNTER — Encounter: Payer: Self-pay | Admitting: Orthopedic Surgery

## 2023-10-20 VITALS — BP 136/64 | Ht 65.0 in | Wt 170.0 lb

## 2023-10-20 DIAGNOSIS — M19012 Primary osteoarthritis, left shoulder: Secondary | ICD-10-CM | POA: Diagnosis not present

## 2023-10-20 MED ORDER — MELOXICAM 7.5 MG PO TABS: 7.5000 mg | ORAL_TABLET | Freq: Every day | ORAL | 0 refills | Status: DC

## 2023-10-20 NOTE — Progress Notes (Signed)
New Patient Visit  Assessment: Allison Freeman is a 78 y.o. female with the following: 1. Glenohumeral arthritis, left  Plan: Allison Freeman has pain in the left shoulder.  Radiographs demonstrate degenerative changes of the glenohumeral joint.  Description of her pain is consistent with these findings.  She has good strength, as well as range of motion.  Will continue to work on symptomatic management.  Discussed multiple treatment options.  She would like to try medications at a more consistent basis.  I provided her with a prescription for meloxicam.  This is the lower dose.  She is to take this daily in the right, and I recommended at nighttime.  Ensure that she has something in her stomach.  Recommend that she take this medication consistently for period of 7 to 10 days, then proceed to take it as needed.  I have also provided her with simple exercises to continue to keep her left shoulder strong.  She can consider injections in the future, including an ultrasound-guided injection.  She states understanding.  She will follow-up as needed.  Follow-up: Return if symptoms worsen or fail to improve.  Subjective:  Chief Complaint  Patient presents with   Shoulder Pain    For a year no injury     History of Present Illness: Allison Freeman is a 78 y.o. female who has been referred by Lynnea Ferrier, MD for evaluation of left shoulder pain.  She is right-hand dominant.  She has had pain in the left shoulder for about a year.  No specific injury.  Pain gets worse at night.  She has difficulty laying on her left shoulder.  She has occasional radiating pains down the front of her arm.  Otherwise, the pain is localized to the shoulder.  She describes the pain as a aching sensation.  No numbness or tingling.  No history of an injury to the left shoulder.  She has tried Tylenol, but not consistently.  She reportedly had an injection in her left shoulder, although it is not clear of the  location.  Injection was not helpful.  She is try some topical treatments, including diclofenac.   Review of Systems: No fevers or chills No numbness or tingling No chest pain No shortness of breath No bowel or bladder dysfunction No GI distress No headaches   Medical History:  Past Medical History:  Diagnosis Date   Acid reflux    Arthritis    Depression    Family history of adverse reaction to anesthesia    Mother woke up and could hearduring surgery   Hypercholesterolemia    Hypertension    Insomnia    Osteoporosis    Panic attacks    panic attacks   Reflux    Restless leg syndrome     Past Surgical History:  Procedure Laterality Date   ABDOMINAL HYSTERECTOMY  2001   BLADDER SURGERY     BREAST BIOPSY  1987   BREAST CYST EXCISION  1998   CARPAL TUNNEL RELEASE Right 04/06/2015   Procedure: CARPAL TUNNEL RELEASE;  Surgeon: Vickki Hearing, MD;  Location: [facility];  Service: Orthopedics;  Laterality: Right;   CARPAL TUNNEL RELEASE Left 06/30/2022   Procedure: LEFT CARPAL TUNNEL RELEASE;  Surgeon: Betha Loa, MD;  Location: [facility];  Service: Orthopedics;  Laterality: Left;   COLONOSCOPY  09/19/2004   KGU:[id number] hemorrhoids, otherwise normal rectum/Sigmoid diverticula.  Remainder of colon mucosa appeared normal   COLONOSCOPY WITH ESOPHAGOGASTRODUODENOSCOPY (EGD) N/A  06/24/2013   ZOX:[id number] polyp-removed. Small hiatal hernia. Abnormal gastric mucosa-query NSAID effect-s/p (chronic inactive gastritis, negative H. pylori)/TCS: Prominent internal hemorrhoids-likely source of hematochezia. Colonic diverticulosis. Colonic polyp-removed (tubular adenoma)   COLONOSCOPY WITH PROPOFOL N/A 11/01/2018   Procedure: COLONOSCOPY WITH PROPOFOL;  Surgeon: Corbin Ade, MD;  [facility];  Service: Endoscopy;  Laterality: N/A;  7:30am   CYSTOCELE REPAIR  2010   DILATION AND CURETTAGE OF UTERUS     JOINT REPLACEMENT N/A    Phreesia 03/02/2020   KNEE  SURGERY Right 2003   arthroscopic left   RECTAL SURGERY     RECTOCELE REPAIR  2010   REPLACEMENT TOTAL KNEE Right    TONSILLECTOMY     TOTAL KNEE ARTHROPLASTY Right 05/09/2019   Procedure: TOTAL KNEE ARTHROPLASTY;  Surgeon: Dannielle Huh, MD;  Location: [facility];  Service: Orthopedics;  Laterality: Right;    Family History  Problem Relation Age of Onset   Cancer Mother        Breast   Hypertension Father    Heart attack Father    Cancer Sister        Breast   Cancer Sister        breast   Breast cancer Sister    Colon cancer Neg Hx    Social History   Tobacco Use   Smoking status: Never   Smokeless tobacco: Never  Vaping Use   Vaping status: Never Used  Substance Use Topics   Alcohol use: No   Drug use: No    No Known Allergies  Current Meds  Medication Sig   acyclovir (ZOVIRAX) 400 MG tablet Take 400 mg by mouth 3 (three) times daily.   ALPRAZolam (XANAX) 0.5 MG tablet TAKE ONE TABLET (0.5MG  TOTAL) BY MOUTH AT BEDTIME   amoxicillin (AMOXIL) 875 MG tablet Take 1 tablet (875 mg total) by mouth 2 (two) times daily for 10 days.   Calcium-Magnesium-Vitamin D (CALCIUM 1200+D3 PO) Take 1 tablet by mouth daily.   Carboxymethylcellul-Glycerin (LUBRICATING EYE DROPS OP) Place 1 drop into both eyes 2 (two) times daily.   cyclobenzaprine (FLEXERIL) 10 MG tablet TAKE ONE TABLET (10MG  TOTAL) BY MOUTH THREE TIMES DAILY AS NEEDED FOR MUSCLE SPASMS   denosumab (PROLIA) 60 MG/ML SOSY injection Inject 60 mg into the skin every 6 (six) months. Last injection 09/19/22   EYSUVIS 0.25 % SUSP Apply 1 drop to eye 2 (two) times daily.   famotidine (PEPCID) 40 MG tablet Take 1 tablet (40 mg total) by mouth daily.   fluorometholone (FML) 0.1 % ophthalmic suspension SMARTSIG:In Eye(s)   fluticasone (FLONASE) 50 MCG/ACT nasal spray Use 2 sprays in each  nostril once daily (Patient taking differently: Place 2 sprays into both nostrils at bedtime.)   gabapentin (NEURONTIN) 100 MG capsule Take 1  capsule (100 mg total) by mouth 3 (three) times daily.   levocetirizine (XYZAL) 5 MG tablet Take 5 mg by mouth daily.   LINZESS 290 MCG CAPS capsule TAKE ONE CAPSULE ( TOTAL) BY MOUTHDAILY BEFORE BREAKFAST   losartan (COZAAR) 100 MG tablet TAKE ONE TABLET (100 MG TOTAL) BY MOUTH DAILY.   MELATONIN ER PO Take by mouth.   meloxicam (MOBIC) 7.5 MG tablet Take 1 tablet (7.5 mg total) by mouth daily.   Multiple Vitamin (MULTIVITAMIN) tablet Take 1 tablet by mouth daily.   Omega-3 Fatty Acids (FISH OIL) 1200 MG CAPS Take 1,200 mg by mouth 2 (two) times daily.    pantoprazole (PROTONIX) 40 MG tablet TAKE  ONE TABLET TWICE DAILY BEFORE MEALS   pravastatin (PRAVACHOL) 40 MG tablet TAKE ONE (1) TABLET BY MOUTH EVERY DAY   rOPINIRole (REQUIP) 2 MG tablet Take 1 tablet (2 mg total) by mouth at bedtime.   traMADol (ULTRAM) 50 MG tablet Take 1 tablet (50 mg total) by mouth every 6 (six) hours as needed.   Vibegron (GEMTESA) 75 MG TABS Take 1 tablet (75 mg total) by mouth daily.   [DISCONTINUED] meloxicam (MOBIC) 15 MG tablet Take 15 mg by mouth daily.    Objective: BP 136/64 Comment: 10/13/23  Ht 5\' 5"  (1.651 m)   Wt 170 lb (77.1 kg)   BMI 28.29 kg/m   Physical Exam:  General: Alert and oriented. and No acute distress. Gait: Normal gait.  Left shoulder without deformity or swelling.  No bruising.  No redness.  Full forward flexion, with some discomfort.  Internal rotation to T12.  External rotation of 45 degrees, similar to the contralateral side.  She has excellent strength supraspinatus, infraspinatus and subscapularis.  Fingers are warm and well-perfused.  IMAGING: I personally reviewed images previously obtained in clinic  X-rays of the left shoulder were previously obtained.  No acute injuries.  Loss of glenohumeral joint space.  On the [facility] projection there is an inferior osteophyte.  On the axillary view, osteophytes are appreciated both anterior and posterior.   New Medications:   Meds ordered this encounter  Medications   meloxicam (MOBIC) 7.5 MG tablet    Sig: Take 1 tablet (7.5 mg total) by mouth daily.    Dispense:  30 tablet    Refill:  0      Oliver Barre, MD  10/20/2023 8:49 AM

## 2023-10-20 NOTE — Patient Instructions (Signed)

## 2023-10-29 ENCOUNTER — Other Ambulatory Visit: Payer: Self-pay | Admitting: Family Medicine

## 2023-11-12 ENCOUNTER — Other Ambulatory Visit: Payer: Self-pay | Admitting: Orthopedic Surgery

## 2023-11-17 DIAGNOSIS — H524 Presbyopia: Secondary | ICD-10-CM | POA: Diagnosis not present

## 2023-11-19 ENCOUNTER — Other Ambulatory Visit: Payer: Self-pay | Admitting: Family Medicine

## 2023-11-19 ENCOUNTER — Other Ambulatory Visit: Payer: Self-pay

## 2023-11-19 DIAGNOSIS — I10 Essential (primary) hypertension: Secondary | ICD-10-CM

## 2023-11-19 MED ORDER — LOSARTAN POTASSIUM 100 MG PO TABS
ORAL_TABLET | ORAL | 1 refills | Status: DC
Start: 2023-11-19 — End: 2024-03-20

## 2023-12-08 ENCOUNTER — Telehealth (INDEPENDENT_AMBULATORY_CARE_PROVIDER_SITE_OTHER): Payer: Self-pay | Admitting: Otolaryngology

## 2023-12-08 ENCOUNTER — Encounter (INDEPENDENT_AMBULATORY_CARE_PROVIDER_SITE_OTHER): Payer: Self-pay

## 2023-12-08 NOTE — Telephone Encounter (Signed)
 LVM to r/s appt - office closed [phone] afm

## 2023-12-09 ENCOUNTER — Other Ambulatory Visit: Payer: Self-pay | Admitting: Family Medicine

## 2023-12-10 NOTE — Telephone Encounter (Signed)
 Requested Prescriptions  Pending Prescriptions Disp Refills   famotidine (PEPCID) 40 MG tablet [Pharmacy Med Name: FAMOTIDINE 40 MG TAB] 90 tablet 0    Sig: TAKE ONE TABLET (40MG  TOTAL) BY MOUTH DAILY     Gastroenterology:  H2 Antagonists Failed - 12/10/2023  3:46 PM      Failed - Valid encounter within last 12 months    Recent Outpatient Visits           2 years ago Upper respiratory tract infection, unspecified type   [facility] Valentino Nose, NP   2 years ago Encounter for screening mammogram for malignant neoplasm of breast   [facility] Pickard, Priscille Heidelberg, MD   3 years ago Other osteoporosis, unspecified pathological fracture presence   [facility] Donita Brooks, MD   3 years ago Acute non-recurrent frontal sinusitis   [facility] Donita Brooks, MD   3 years ago Right low back pain, unspecified chronicity, unspecified whether sciatica present   [facility] Pickard, Priscille Heidelberg, MD       Future Appointments             In 1 week Nida, Denman George, MD [facility]

## 2023-12-21 DIAGNOSIS — Z6829 Body mass index (BMI) 29.0-29.9, adult: Secondary | ICD-10-CM | POA: Diagnosis not present

## 2023-12-21 DIAGNOSIS — M2550 Pain in unspecified joint: Secondary | ICD-10-CM | POA: Diagnosis not present

## 2023-12-21 DIAGNOSIS — R030 Elevated blood-pressure reading, without diagnosis of hypertension: Secondary | ICD-10-CM | POA: Diagnosis not present

## 2023-12-21 DIAGNOSIS — E663 Overweight: Secondary | ICD-10-CM | POA: Diagnosis not present

## 2023-12-22 ENCOUNTER — Other Ambulatory Visit: Payer: Self-pay

## 2023-12-22 ENCOUNTER — Ambulatory Visit (INDEPENDENT_AMBULATORY_CARE_PROVIDER_SITE_OTHER): Payer: Medicare Other | Admitting: "Endocrinology

## 2023-12-22 ENCOUNTER — Encounter: Payer: Self-pay | Admitting: "Endocrinology

## 2023-12-22 VITALS — BP 130/82 | HR 96 | Ht 65.0 in | Wt 173.0 lb

## 2023-12-22 DIAGNOSIS — M810 Age-related osteoporosis without current pathological fracture: Secondary | ICD-10-CM | POA: Diagnosis not present

## 2023-12-22 MED ORDER — DENOSUMAB 60 MG/ML ~~LOC~~ SOSY: 60.0000 mg | PREFILLED_SYRINGE | SUBCUTANEOUS | 1 refills | Status: DC

## 2023-12-22 NOTE — Progress Notes (Signed)
 Endocrinology Consult Note                                            12/22/2023, 8:58 AM   Subjective:    Patient ID: Allison Freeman, female    DOB: 1946-01-07, PCP Donita Brooks, MD   Past Medical History:  Diagnosis Date   Acid reflux    Arthritis    Depression    Family history of adverse reaction to anesthesia    Mother woke up and could hearduring surgery   Hypercholesterolemia    Hypertension    Insomnia    Osteoporosis    Panic attacks    panic attacks   Reflux    Restless leg syndrome    Past Surgical History:  Procedure Laterality Date   ABDOMINAL HYSTERECTOMY  2001   BLADDER SURGERY     BREAST BIOPSY  1987   BREAST CYST EXCISION  1998   CARPAL TUNNEL RELEASE Right 04/06/2015   Procedure: CARPAL TUNNEL RELEASE;  Surgeon: Vickki Hearing, MD;  Location: [facility];  Service: Orthopedics;  Laterality: Right;   CARPAL TUNNEL RELEASE Left 06/30/2022   Procedure: LEFT CARPAL TUNNEL RELEASE;  Surgeon: Betha Loa, MD;  Location: [facility];  Service: Orthopedics;  Laterality: Left;   COLONOSCOPY  09/19/2004   UYQ:[id number] hemorrhoids, otherwise normal rectum/Sigmoid diverticula.  Remainder of colon mucosa appeared normal   COLONOSCOPY WITH ESOPHAGOGASTRODUODENOSCOPY (EGD) N/A 06/24/2013   DGL:OVFIEPP polyp-removed. Small hiatal hernia. Abnormal gastric mucosa-query NSAID effect-s/p (chronic inactive gastritis, negative H. pylori)/TCS: Prominent internal hemorrhoids-likely source of hematochezia. Colonic diverticulosis. Colonic polyp-removed (tubular adenoma)   COLONOSCOPY WITH PROPOFOL N/A 11/01/2018   Procedure: COLONOSCOPY WITH PROPOFOL;  Surgeon: Corbin Ade, MD;  Location: [facility];  Service: Endoscopy;  Laterality: N/A;  7:30am   CYSTOCELE REPAIR  2010   DILATION AND CURETTAGE OF UTERUS     JOINT REPLACEMENT N/A    [employer]reesia 03/02/2020   KNEE SURGERY Right 2003   arthroscopic left   RECTAL SURGERY     RECTOCELE REPAIR  2010    REPLACEMENT TOTAL KNEE Right    TONSILLECTOMY     TOTAL KNEE ARTHROPLASTY Right 05/09/2019   Procedure: TOTAL KNEE ARTHROPLASTY;  Surgeon: Dannielle Huh, MD;  Location: [facility];  Service: Orthopedics;  Laterality: Right;   Social History   Socioeconomic History   Marital status: Married    Spouse name: Not on file   Number of children: Not on file   Years of education: Not on file   Highest education level: Not on file  Occupational History   Not on file  Tobacco Use   Smoking status: Never   Smokeless tobacco: Never  Vaping Use   Vaping status: Never Used  Substance and Sexual Activity   Alcohol use: No   Drug use: No   Sexual activity: Yes    Birth control/protection: Surgical  Other Topics Concern   Not on file  Social History Narrative   Not on file   Social Drivers of Health   Financial Resource Strain: Low Risk  (03/05/2023)   Overall Financial Resource Strain (CARDIA)    Difficulty of Paying Living Expenses: Not hard at all  Food Insecurity: No Food Insecurity (03/05/2023)   Hunger Vital Sign    Worried About Running Out of Food in the Last  Year: Never true    Ran Out of Food in the Last Year: Never true  Transportation Needs: No Transportation Needs (03/05/2023)   PRAPARE - Administrator, Civil Service (Medical): No    Lack of Transportation (Non-Medical): No  Physical Activity: Insufficiently Active (03/05/2023)   Exercise Vital Sign    Days of Exercise per Week: 3 days    Minutes of Exercise per Session: 30 min  Stress: No Stress Concern Present (03/05/2023)   [employer] of Occupational Health - Occupational Stress Questionnaire    Feeling of Stress : Not at all  Social Connections: Socially Integrated (03/05/2023)   Social Connection and Isolation Panel [NHANES]    Frequency of Communication with Friends and Family: More than three times a week    Frequency of Social Gatherings with Friends and Family: Three times a week    Attends  Religious Services: More than 4 times per year    Active Member of Clubs or Organizations: Yes    Attends Banker Meetings: 1 to 4 times per year    Marital Status: Married   Family History  Problem Relation Age of Onset   Hypertension Mother    Cancer Mother        Breast   Hyperlipidemia Mother    Osteoporosis Mother    Hyperlipidemia Father    Hypertension Father    Heart attack Father    Cancer Sister        Breast   Cancer Sister        breast   Breast cancer Sister    Colon cancer Neg Hx    Outpatient Encounter Medications as of 12/22/2023  Medication Sig   predniSONE (DELTASONE) 20 MG tablet Take 20 mg by mouth as directed.   acyclovir (ZOVIRAX) 400 MG tablet Take 400 mg by mouth 3 (three) times daily.   ALPRAZolam (XANAX) 0.5 MG tablet TAKE ONE TABLET (0.5MG  TOTAL) BY MOUTH AT BEDTIME   Calcium-Magnesium-Vitamin D (CALCIUM 1200+D3 PO) Take 1 tablet by mouth daily.   Carboxymethylcellul-Glycerin (LUBRICATING EYE DROPS OP) Place 1 drop into both eyes 2 (two) times daily.   cyclobenzaprine (FLEXERIL) 10 MG tablet TAKE ONE TABLET (10MG  TOTAL) BY MOUTH THREE TIMES DAILY AS NEEDED FOR MUSCLE SPASMS   denosumab (PROLIA) 60 MG/ML SOSY injection Inject 60 mg into the skin every 6 (six) months. Last injection 09/19/22   EYSUVIS 0.25 % SUSP Apply 1 drop to eye 2 (two) times daily.   famotidine (PEPCID) 40 MG tablet TAKE ONE TABLET (40MG  TOTAL) BY MOUTH DAILY   fluticasone (FLONASE) 50 MCG/ACT nasal spray Use 2 sprays in each  nostril once daily (Patient taking differently: Place 2 sprays into both nostrils at bedtime.)   gabapentin (NEURONTIN) 100 MG capsule Take 1 capsule (100 mg total) by mouth 3 (three) times daily.   levocetirizine (XYZAL) 5 MG tablet Take 5 mg by mouth daily.   LINZESS 290 MCG CAPS capsule TAKE ONE CAPSULE ( TOTAL) BY MOUTHDAILY BEFORE BREAKFAST   losartan (COZAAR) 100 MG tablet TAKE ONE TABLET (100 MG TOTAL) BY MOUTH DAILY.   losartan  (COZAAR) 100 MG tablet TAKE ONE TABLET (100 MG TOTAL) BY MOUTH DAILY.   MELATONIN ER PO Take by mouth.   meloxicam (MOBIC) 7.5 MG tablet TAKE ONE (1) TABLET BY MOUTH EVERY DAY   Multiple Vitamin (MULTIVITAMIN) tablet Take 1 tablet by mouth daily.   Omega-3 Fatty Acids (FISH OIL) 1200 MG CAPS Take 1,200 mg by mouth  2 (two) times daily.    pantoprazole (PROTONIX) 40 MG tablet TAKE ONE TABLET TWICE DAILY BEFORE MEALS   pravastatin (PRAVACHOL) 40 MG tablet TAKE ONE (1) TABLET BY MOUTH EVERY DAY   rOPINIRole (REQUIP) 2 MG tablet Take 1 tablet (2 mg total) by mouth at bedtime.   traMADol (ULTRAM) 50 MG tablet Take 1 tablet (50 mg total) by mouth every 6 (six) hours as needed.   [DISCONTINUED] fluorometholone (FML) 0.1 % ophthalmic suspension SMARTSIG:In Eye(s)   [DISCONTINUED] Vibegron (GEMTESA) 75 MG TABS Take 1 tablet (75 mg total) by mouth daily.   No facility-administered encounter medications on file as of 12/22/2023.   ALLERGIES: No Known Allergies  VACCINATION STATUS: Immunization History  Administered Date(s) Administered   Fluad Quad(high Dose 65+) 08/07/2020, 06/11/2021   Fluad Trivalent(High Dose 65+) 09/07/2023   Influenza,inj,Quad PF,6+ Mos 06/23/2014, 06/27/2015, 06/27/2016   Influenza-Unspecified 06/30/2013, 07/14/2017, 07/06/2018   PFIZER(Purple Top)SARS-COV-2 Vaccination 10/13/2019, 11/03/2019   Pneumococcal Conjugate-13 06/23/2014   Pneumococcal Polysaccharide-23 01/28/2016   Zoster Recombinant(Shingrix) 11/27/2017, 03/01/2018    HPI Allison Freeman is 78 y.o. female who presents today with a medical history as above. she is being seen in consultation for osteoporosis requested by Donita Brooks, MD.   History is obtained directly from the patient and chart review. Patient reports that she was diagnosed with osteoporosis based on bone density approximately 3 years ago.  She has available DEXA scan results from 2007, 2010, 2022, 2024.  Right forearm bone density  T-score was -2.3 in August 2022 and dropped to -2.9 in August 2024. She continued to have normal bone density in left hip.  Lumbar spine was excluded due to advanced degenerative changes.  Patient denies any prior history of fracture particularly fragility fractures.  She reports 1 inch of height loss. As far as she recalls, she was not treated with any bisphosphonates before she was started on Prolia.  She denies any history of GERD.  She received at least 4 Prolia injections, last dose was in July 2024.  She was due for 09 September 2023, however lost the arrangement for her to get it at her PMD office.   She wishes to switch her Prolia treatment to this practice. She has favorable labs from January 2025 including renal function.  She has strong family history of osteoporosis in her mother as well as grandparents.  Her other medical problems include hypertension, hyperlipidemia. More recently, she was diagnosed with osteoarthritis for which she is given a tapering dose of prednisone x 5 days. She denies any prior long-term exposure to steroids    Review of Systems  Constitutional: +mildly fluctuating body weight , no fatigue, no subjective hyperthermia, no subjective hypothermia Eyes: no blurry vision, no xerophthalmia ENT: no sore throat, no nodules palpated in throat, no dysphagia/odynophagia, no hoarseness Cardiovascular: no Chest Pain, no Shortness of Breath, no palpitations, no leg swelling Respiratory: no cough, no shortness of breath Gastrointestinal: no Nausea/Vomiting/Diarhhea Musculoskeletal: no muscle/joint aches Skin: no rashes Neurological: no tremors, no numbness, no tingling, no dizziness Psychiatric: no depression, no anxiety  Objective:       12/22/2023    8:22 AM 10/20/2023    8:12 AM 10/13/2023   12:02 PM  Vitals with BMI  Height 5\' 5"  5\' 5"  5\' 5"   Weight 173 lbs 170 lbs 175 lbs  BMI 28.79 28.29 29.12  Systolic 130 136 161  Diastolic 82 64 64  Pulse 96  65     BP 130/82  Pulse 96   Ht 5\' 5"  (1.651 m)   Wt 173 lb (78.5 kg)   BMI 28.79 kg/m   Wt Readings from Last 3 Encounters:  12/22/23 173 lb (78.5 kg)  10/20/23 170 lb (77.1 kg)  10/13/23 175 lb (79.4 kg)    Physical Exam  Constitutional:  Body mass index is 28.79 kg/m.,  not in acute distress, normal state of mind Eyes: PERRLA, EOMI, no exophthalmos ENT: moist mucous membranes, no gross thyromegaly, no gross cervical lymphadenopathy Cardiovascular: normal precordial activity, Regular Rate and Rhythm, no Murmur/Rubs/Gallops Respiratory:  adequate breathing efforts, no gross chest deformity, Clear to auscultation bilaterally Gastrointestinal: abdomen soft, Non -tender, No distension, Bowel Sounds present, no gross organomegaly Musculoskeletal: + Mild to moderate kyphotic deformity of thoracic spine,  strength intact in all four extremities, no peripheral edema Skin: moist, warm, no rashes Neurological: no tremor with outstretched hands, Deep tendon reflexes normal in bilateral lower extremities.  CMP ( most recent) CMP     Component Value Date/Time   NA 139 10/13/2023 1224   NA 141 03/02/2015 0836   K 4.2 10/13/2023 1224   CL 103 10/13/2023 1224   CO2 26 10/13/2023 1224   GLUCOSE 91 10/13/2023 1224   BUN 27 (H) 10/13/2023 1224   BUN 18 03/02/2015 0836   CREATININE 0.88 10/13/2023 1224   CALCIUM 9.6 10/13/2023 1224   PROT 6.4 03/23/2023 0909   PROT 6.3 10/19/2015 0808   ALBUMIN 3.4 (L) 05/05/2019 1555   ALBUMIN 4.0 10/19/2015 0808   AST 17 03/23/2023 0909   ALT 14 03/23/2023 0909   ALKPHOS 40 05/05/2019 1555   BILITOT 1.1 03/23/2023 0909   BILITOT 0.8 10/19/2015 0808   EGFR 68 10/13/2023 1224   GFRNONAA 65 03/07/2021 0905      Lipid Panel ( most recent) Lipid Panel     Component Value Date/Time   CHOL 158 03/23/2023 0909   CHOL 169 10/19/2015 0808   TRIG 75 03/23/2023 0909   HDL 59 03/23/2023 0909   HDL 54 10/19/2015 0808   CHOLHDL 2.7 03/23/2023 0909    VLDL 19 03/05/2017 0821   LDLCALC 83 03/23/2023 0909   LABVLDL 19 10/19/2015 0808      Lab Results  Component Value Date   TSH 2.15 03/05/2020      Assessment & Plan:   1. Age-related osteoporosis without current pathological fracture (Primary)  - Allison Freeman  is being seen at a kind request of Donita Brooks, MD. - I have reviewed her available  records and clinically evaluated the patient. - Based on these reviews, she has osteoporosis disproportionately affecting her right wrist bones.  She is status post several doses of Prolia treatment, missed her  due dose in December 2024 while transitioning her care arrangement. This will be requested and provided to her as soon as procured and every 54-month.  I discussed the importance of treatment continued he to lower her risk of Prolia withdrawal fracture.  She will be contacted in few days to practice to get her treatment.  Pronounced cortical bone loss on forearm compared to other sites will need further workup particularly ruling out hyperparathyroidism.  Her next blood work will include the following:  - PTH, intact and calcium - Magnesium - Phosphorus - Comprehensive metabolic panel with GFR -She is vitamin D sufficient at 57, advised to continue her vitamin D supplements compliant with calcium and vitamin D.  She is counseled on fall precautions, adequate nutrition. She will be  continued on Prolia treatment indefinitely or as long as she has access for it.  If it is not possible to continue with Prolia, she will be given backup treatment with IV Reclast.   - she is advised to maintain close follow up with Donita Brooks, MD for primary care needs.   -Thank you for involving me in the care of this pleasant patient.  Time spent with the patient: 46  minutes was spent in  counseling her about her osteoporosis and the rest in obtaining information about her symptoms, reviewing her previous labs/studies ( including  abstractions from other facilities),  evaluations, and treatments,  and developing a plan to confirm diagnosis and long term treatment based on the latest standards of care/guidelines; and documenting her care.  Allison Freeman participated in the discussions, expressed understanding, and voiced agreement with the above plans.  All questions were answered to her satisfaction. she is encouraged to contact clinic should she have any questions or concerns prior to her return visit.  Follow up plan: Return in about 6 months (around 06/22/2024) for Prolia Today (ASAP) and Prolia NV.   Marquis Lunch, MD [facility] [address] [place], [place] [address] Phone: [phone]  Fax: [phone]     12/22/2023, 8:58 AM  This note was partially dictated with voice recognition software. Similar sounding words can be transcribed inadequately or may not  be corrected upon review.

## 2023-12-29 DIAGNOSIS — M810 Age-related osteoporosis without current pathological fracture: Secondary | ICD-10-CM | POA: Diagnosis not present

## 2023-12-30 LAB — COMPREHENSIVE METABOLIC PANEL WITH GFR
ALT: 21 IU/L (ref 0–32)
AST: 21 IU/L (ref 0–40)
Albumin: 4 g/dL (ref 3.8–4.8)
Alkaline Phosphatase: 72 IU/L (ref 44–121)
BUN/Creatinine Ratio: 26 (ref 12–28)
BUN: 26 mg/dL (ref 8–27)
Bilirubin Total: 1.5 mg/dL — ABNORMAL HIGH (ref 0.0–1.2)
CO2: 26 mmol/L (ref 20–29)
Calcium: 9.9 mg/dL (ref 8.7–10.3)
Chloride: 102 mmol/L (ref 96–106)
Creatinine, Ser: 1.01 mg/dL — ABNORMAL HIGH (ref 0.57–1.00)
Globulin, Total: 2.3 g/dL (ref 1.5–4.5)
Glucose: 93 mg/dL (ref 70–99)
Potassium: 4.5 mmol/L (ref 3.5–5.2)
Sodium: 142 mmol/L (ref 134–144)
Total Protein: 6.3 g/dL (ref 6.0–8.5)
eGFR: 58 mL/min/1.73 — ABNORMAL LOW

## 2023-12-30 LAB — PHOSPHORUS: Phosphorus: 4.3 mg/dL (ref 3.0–4.3)

## 2023-12-30 LAB — PTH, INTACT AND CALCIUM: PTH: 16 pg/mL (ref 15–65)

## 2023-12-30 LAB — MAGNESIUM: Magnesium: 2.2 mg/dL (ref 1.6–2.3)

## 2024-01-05 ENCOUNTER — Other Ambulatory Visit: Payer: Self-pay

## 2024-01-05 ENCOUNTER — Encounter (HOSPITAL_COMMUNITY): Payer: Self-pay

## 2024-01-05 ENCOUNTER — Other Ambulatory Visit (HOSPITAL_COMMUNITY): Payer: Self-pay | Admitting: Pharmacy Technician

## 2024-01-05 ENCOUNTER — Other Ambulatory Visit: Payer: Self-pay | Admitting: Orthopedic Surgery

## 2024-01-05 ENCOUNTER — Other Ambulatory Visit (HOSPITAL_COMMUNITY): Payer: Self-pay

## 2024-01-05 DIAGNOSIS — M810 Age-related osteoporosis without current pathological fracture: Secondary | ICD-10-CM

## 2024-01-05 MED ORDER — DENOSUMAB 60 MG/ML ~~LOC~~ SOSY
60.0000 mg | PREFILLED_SYRINGE | SUBCUTANEOUS | 1 refills | Status: DC
  Filled 2024-01-05 – 2024-01-15 (×2): qty 1, 180d supply, fill #0

## 2024-01-05 NOTE — Progress Notes (Signed)
 Pharmacy Patient Advocate Encounter  Insurance verification completed.   The patient is insured through [employer]   Ran test claim for Prolia. Co-pay is $793.31.  Patient is eligible for Medicare Payment plan.   This test claim was processed through [facility]- copay amounts may vary at other pharmacies due to pharmacy/plan contracts, or as the patient moves through the different stages of their insurance plan.

## 2024-01-06 ENCOUNTER — Other Ambulatory Visit: Payer: Self-pay

## 2024-01-07 ENCOUNTER — Other Ambulatory Visit: Payer: Self-pay

## 2024-01-11 ENCOUNTER — Ambulatory Visit (INDEPENDENT_AMBULATORY_CARE_PROVIDER_SITE_OTHER): Payer: Medicare HMO

## 2024-01-12 ENCOUNTER — Other Ambulatory Visit: Payer: Self-pay

## 2024-01-13 ENCOUNTER — Other Ambulatory Visit: Payer: Self-pay

## 2024-01-14 ENCOUNTER — Other Ambulatory Visit: Payer: Self-pay

## 2024-01-15 ENCOUNTER — Other Ambulatory Visit: Payer: Self-pay

## 2024-01-15 NOTE — Progress Notes (Signed)
 Specialty Pharmacy Initial Fill Coordination Note  Allison Freeman is a 78 y.o. female contacted today regarding initial fill of specialty medication(s) Denosumab  (PROLIA )   Patient requested Courier to Provider Office   Delivery date: 01/19/24   Verified address: [facility]- [address]   Medication will be filled on 4/28.   Patient is aware of $0 copayment.

## 2024-01-19 ENCOUNTER — Other Ambulatory Visit: Payer: Self-pay

## 2024-01-26 ENCOUNTER — Ambulatory Visit

## 2024-01-27 ENCOUNTER — Ambulatory Visit

## 2024-01-27 VITALS — BP 136/84 | HR 76 | Ht 65.0 in | Wt 173.0 lb

## 2024-01-27 DIAGNOSIS — M810 Age-related osteoporosis without current pathological fracture: Secondary | ICD-10-CM | POA: Diagnosis not present

## 2024-01-27 MED ORDER — DENOSUMAB 60 MG/ML ~~LOC~~ SOSY
60.0000 mg | PREFILLED_SYRINGE | Freq: Once | SUBCUTANEOUS | Status: AC
Start: 2024-01-27 — End: 2024-01-27
  Administered 2024-01-27: 60 mg via SUBCUTANEOUS

## 2024-01-27 NOTE — Progress Notes (Signed)
 Pt seen in office for nurse visit to receive her Prolia  injection. Pt provided prolia  (lot # [id number], exp.date 04/21/2026). Reviewed possible side effects and answered pt questions. Understanding voiced. Prolia  60mg  given SQ in upper L arm without difficulty. Pt waited 15 minutes post injection, no adverse reactions noted.

## 2024-02-02 DIAGNOSIS — B009 Herpesviral infection, unspecified: Secondary | ICD-10-CM | POA: Diagnosis not present

## 2024-02-02 DIAGNOSIS — L573 Poikiloderma of Civatte: Secondary | ICD-10-CM | POA: Diagnosis not present

## 2024-02-02 DIAGNOSIS — L814 Other melanin hyperpigmentation: Secondary | ICD-10-CM | POA: Diagnosis not present

## 2024-02-03 ENCOUNTER — Ambulatory Visit (INDEPENDENT_AMBULATORY_CARE_PROVIDER_SITE_OTHER): Admitting: Podiatry

## 2024-02-03 DIAGNOSIS — M19071 Primary osteoarthritis, right ankle and foot: Secondary | ICD-10-CM

## 2024-02-03 DIAGNOSIS — M19072 Primary osteoarthritis, left ankle and foot: Secondary | ICD-10-CM

## 2024-02-03 NOTE — Progress Notes (Signed)
 Subjective:  Patient ID: Allison Freeman, female    DOB: October 19, 1945,  MRN: [id number]  Chief Complaint  Patient presents with   Arthritis    Pt stated that she has arthritis in the tops of her foot     78 y.o. female presents with the above complaint.  Patient presents with bilateral top midfoot arthritis that has been present for quite some time is progressive gotten worse worse with ambulation is with pressure she wants to discuss treatment options for this.  Pain scale 5 out of 10 dull aching nature hurts with getting out of bed forcing the morning dull aching nature   Review of Systems: Negative except as noted in the HPI. Denies N/V/F/Ch.  Past Medical History:  Diagnosis Date   Acid reflux    Arthritis    Depression    Family history of adverse reaction to anesthesia    Mother woke up and could hearduring surgery   Hypercholesterolemia    Hypertension    Insomnia    Osteoporosis    Panic attacks    panic attacks   Reflux    Restless leg syndrome     Current Outpatient Medications:    acyclovir (ZOVIRAX) 400 MG tablet, Take 400 mg by mouth 3 (three) times daily., Disp: , Rfl:    ALPRAZolam  (XANAX ) 0.5 MG tablet, TAKE ONE TABLET (0.5MG  TOTAL) BY MOUTH AT BEDTIME, Disp: 30 tablet, Rfl: 3   Calcium-Magnesium-Vitamin D  (CALCIUM 1200+D3 PO), Take 1 tablet by mouth daily., Disp: , Rfl:    Carboxymethylcellul-Glycerin (LUBRICATING EYE DROPS OP), Place 1 drop into both eyes 2 (two) times daily., Disp: , Rfl:    cyclobenzaprine  (FLEXERIL ) 10 MG tablet, TAKE ONE TABLET (10MG  TOTAL) BY MOUTH THREE TIMES DAILY AS NEEDED FOR MUSCLE SPASMS, Disp: 90 tablet, Rfl: 3   denosumab  (PROLIA ) 60 MG/ML SOSY injection, Inject 60 mg into the skin every 6 (six) months. Last injection 09/19/22, Disp: 1 mL, Rfl: 1   EYSUVIS 0.25 % SUSP, Apply 1 drop to eye 2 (two) times daily., Disp: , Rfl:    famotidine  (PEPCID ) 40 MG tablet, TAKE ONE TABLET (40MG  TOTAL) BY MOUTH DAILY, Disp: 90 tablet, Rfl: 0    fluticasone  (FLONASE ) 50 MCG/ACT nasal spray, Use 2 sprays in each  nostril once daily (Patient taking differently: Place 2 sprays into both nostrils at bedtime.), Disp: 48 g, Rfl: 3   gabapentin  (NEURONTIN ) 100 MG capsule, Take 1 capsule (100 mg total) by mouth 3 (three) times daily., Disp: 90 capsule, Rfl: 3   levocetirizine (XYZAL) 5 MG tablet, Take 5 mg by mouth daily., Disp: , Rfl:    LINZESS  290 MCG CAPS capsule, TAKE ONE CAPSULE ( TOTAL) BY MOUTHDAILY BEFORE BREAKFAST, Disp: 30 capsule, Rfl: 3   losartan  (COZAAR ) 100 MG tablet, TAKE ONE TABLET (100 MG TOTAL) BY MOUTH DAILY., Disp: 90 tablet, Rfl: 1   losartan  (COZAAR ) 100 MG tablet, TAKE ONE TABLET (100 MG TOTAL) BY MOUTH DAILY., Disp: 90 tablet, Rfl: 1   MELATONIN ER PO, Take by mouth., Disp: , Rfl:    meloxicam  (MOBIC ) 7.5 MG tablet, TAKE ONE (1) TABLET BY MOUTH EVERY DAY, Disp: 30 tablet, Rfl: 0   Multiple Vitamin (MULTIVITAMIN) tablet, Take 1 tablet by mouth daily., Disp: , Rfl:    Omega-3 Fatty Acids (FISH OIL) 1200 MG CAPS, Take 1,200 mg by mouth 2 (two) times daily. , Disp: , Rfl:    pantoprazole  (PROTONIX ) 40 MG tablet, TAKE ONE TABLET TWICE DAILY BEFORE MEALS, Disp:  180 tablet, Rfl: 1   pravastatin  (PRAVACHOL ) 40 MG tablet, TAKE ONE (1) TABLET BY MOUTH EVERY DAY, Disp: 90 tablet, Rfl: 3   predniSONE  (DELTASONE ) 20 MG tablet, Take 20 mg by mouth as directed., Disp: , Rfl:    rOPINIRole  (REQUIP ) 2 MG tablet, Take 1 tablet (2 mg total) by mouth at bedtime., Disp: 90 tablet, Rfl: 3   traMADol  (ULTRAM ) 50 MG tablet, Take 1 tablet (50 mg total) by mouth every 6 (six) hours as needed., Disp: 90 tablet, Rfl: 2  Social History   Tobacco Use  Smoking Status Never  Smokeless Tobacco Never    No Known Allergies Objective:  There were no vitals filed for this visit. There is no height or weight on file to calculate BMI. Constitutional Well developed. Well nourished.  Vascular Dorsalis pedis pulses palpable  bilaterally. Posterior tibial pulses palpable bilaterally. Capillary refill normal to all digits.  No cyanosis or clubbing noted. Pedal hair growth normal.  Neurologic Normal speech. Oriented to person, place, and time. Epicritic sensation to light touch grossly present bilaterally.  Dermatologic Nails well groomed and normal in appearance. No open wounds. No skin lesions.  Orthopedic: Pain on palpation bilateral dorsal midfoot.  Clinically able to appreciate the underlying arch changes.  Negative extensor tendinitis noted.  Lisfranc interval intact.   Radiographs: None Assessment:   1. Arthritis of right midfoot   2. Arthritis of left midfoot    Plan:  Patient was evaluated and treated and all questions answered.  Bilateral midfoot arthritis - All questions and concerns were discussed with the patient in extensive detail given the amount of pain that she is having should benefit from steroid injection of decreasing inflammatory component surgical pain.  Patient agrees with plan was to proceed with steroid injection. -A steroid injection was performed at bilateral midfoot using 1% plain Lidocaine  and 10 mg of Kenalog . This was well tolerated.   No follow-ups on file.

## 2024-02-10 ENCOUNTER — Other Ambulatory Visit: Payer: Self-pay | Admitting: Family Medicine

## 2024-02-11 NOTE — Telephone Encounter (Signed)
 Requested Prescriptions  Pending Prescriptions Disp Refills   pantoprazole  (PROTONIX ) 40 MG tablet [Pharmacy Med Name: PANTOPRAZOLE  SODIUM 40 MG DR TAB] 180 tablet 0    Sig: TAKE ONE TABLET TWICE DAILY BEFORE MEALS     Gastroenterology: Proton Pump Inhibitors Passed - 02/11/2024  4:31 PM      Passed - Valid encounter within last 12 months    Recent Outpatient Visits           4 months ago Osteoporosis, unspecified osteoporosis type, unspecified pathological fracture presence   [facility] Austine Lefort, MD   5 months ago Rotator cuff arthropathy of left shoulder   [facility] Jenelle Mis, FNP   10 months ago Encounter for [employer] annual wellness exam   [place] [facility] Cheril Cork, Cisco Crest, MD   1 year ago General medical exam    [facility] Pickard, Cisco Crest, MD

## 2024-02-16 ENCOUNTER — Other Ambulatory Visit: Payer: Self-pay | Admitting: Orthopedic Surgery

## 2024-02-19 ENCOUNTER — Encounter (INDEPENDENT_AMBULATORY_CARE_PROVIDER_SITE_OTHER): Payer: Self-pay | Admitting: Otolaryngology

## 2024-02-19 ENCOUNTER — Ambulatory Visit (INDEPENDENT_AMBULATORY_CARE_PROVIDER_SITE_OTHER): Admitting: Otolaryngology

## 2024-02-19 VITALS — BP 143/79 | HR 102 | Ht 64.0 in | Wt 168.0 lb

## 2024-02-19 DIAGNOSIS — R0981 Nasal congestion: Secondary | ICD-10-CM

## 2024-02-19 DIAGNOSIS — H903 Sensorineural hearing loss, bilateral: Secondary | ICD-10-CM | POA: Diagnosis not present

## 2024-02-19 DIAGNOSIS — Z09 Encounter for follow-up examination after completed treatment for conditions other than malignant neoplasm: Secondary | ICD-10-CM | POA: Diagnosis not present

## 2024-02-19 DIAGNOSIS — H6981 Other specified disorders of Eustachian tube, right ear: Secondary | ICD-10-CM | POA: Diagnosis not present

## 2024-02-19 DIAGNOSIS — Z8669 Personal history of other diseases of the nervous system and sense organs: Secondary | ICD-10-CM | POA: Diagnosis not present

## 2024-02-19 DIAGNOSIS — J310 Chronic rhinitis: Secondary | ICD-10-CM | POA: Diagnosis not present

## 2024-02-19 DIAGNOSIS — Z9629 Presence of other otological and audiological implants: Secondary | ICD-10-CM | POA: Diagnosis not present

## 2024-02-19 DIAGNOSIS — H7201 Central perforation of tympanic membrane, right ear: Secondary | ICD-10-CM

## 2024-02-21 DIAGNOSIS — J310 Chronic rhinitis: Secondary | ICD-10-CM | POA: Insufficient documentation

## 2024-02-21 NOTE — Progress Notes (Signed)
 Patient ID: Allison Freeman, female   DOB: 1945-10-27, 78 y.o.   MRN: [id number]  Follow-up: Right eustachian tube dysfunction, right middle ear effusion  HPI: The patient is a 78 year old female who returns today for her follow-up evaluation.  The patient has a history of right ear eustachian tube dysfunction and right middle ear effusion.  She was treated with right ventilating tube placement.  At her last visit in October 2024, the right ear ventilating tube was in place and patent.  The patient returns today reporting no otologic difficulty.  However, she has been experiencing chronic nasal congestion during the allergy months.  She is currently on Flonase  daily.  The patient also has a history of bilateral sensorineural hearing loss.  She has been wearing bilateral hearing aids for the past 2 years.  Currently she denies any otalgia, otorrhea, facial pain, or fever.  Exam: General: Communicates without difficulty, well nourished, no acute distress. Head: Normocephalic, no evidence injury, no tenderness, facial buttresses intact without stepoff. Face/sinus: No tenderness to palpation and percussion. Facial movement is normal and symmetric. Eyes: PERRL, EOMI. No scleral icterus, conjunctivae clear. Neuro: CN II exam reveals vision grossly intact.  No nystagmus at any point of gaze. Ears: Auricles well formed without lesions.  Ear canals are intact without mass or lesion.  No erythema or edema is appreciated.  The right ventilating tube is in place and patent.  Nose: External evaluation reveals normal support and skin without lesions.  Dorsum is intact.  Anterior rhinoscopy reveals congested mucosa over anterior aspect of inferior turbinates and intact septum.  No purulence noted. Oral:  Oral cavity and oropharynx are intact, symmetric, without erythema or edema.  Mucosa is moist without lesions. Neck: Full range of motion without pain.  There is no significant lymphadenopathy.  No masses palpable.   Thyroid  bed within normal limits to palpation.  Parotid glands and submandibular glands equal bilaterally without mass.  Trachea is midline. Neuro:  CN 2-12 grossly intact.   Assessment: 1.  Right ear eustachian tube dysfunction. 2.  The right ear ventilating tube is in place and patent. 3.  Chronic rhinitis with nasal mucosal congestion. 4.  Bilateral high-frequency sensorineural hearing loss.  Plan: 1.  The physical exam findings are reviewed with the patient. 2.  Flonase  nasal spray 2 sprays each nostril daily.  The importance of consistent daily use is discussed. 3.  Continue dry ear precautions on the right side. 4.  The patient will return for reevaluation in 6 months.

## 2024-02-22 ENCOUNTER — Ambulatory Visit (INDEPENDENT_AMBULATORY_CARE_PROVIDER_SITE_OTHER): Admitting: Family Medicine

## 2024-02-22 ENCOUNTER — Ambulatory Visit: Payer: Self-pay

## 2024-02-22 ENCOUNTER — Encounter: Payer: Self-pay | Admitting: Family Medicine

## 2024-02-22 ENCOUNTER — Other Ambulatory Visit: Payer: Self-pay | Admitting: Family Medicine

## 2024-02-22 VITALS — BP 127/87 | HR 82 | Temp 97.7°F | Ht 64.0 in | Wt 172.6 lb

## 2024-02-22 DIAGNOSIS — R300 Dysuria: Secondary | ICD-10-CM | POA: Insufficient documentation

## 2024-02-22 LAB — URINALYSIS, ROUTINE W REFLEX MICROSCOPIC
Bacteria, UA: NONE SEEN /HPF
Bilirubin Urine: NEGATIVE
Glucose, UA: NEGATIVE
Hgb urine dipstick: NEGATIVE
Hyaline Cast: NONE SEEN /LPF
Ketones, ur: NEGATIVE
Nitrite: NEGATIVE
Protein, ur: NEGATIVE
RBC / HPF: NONE SEEN /HPF (ref 0–2)
Specific Gravity, Urine: 1.01 (ref 1.001–1.035)
pH: 5.5 (ref 5.0–8.0)

## 2024-02-22 LAB — MICROSCOPIC MESSAGE

## 2024-02-22 MED ORDER — CEPHALEXIN 500 MG PO CAPS: 500.0000 mg | ORAL_CAPSULE | Freq: Two times a day (BID) | ORAL | 0 refills | Status: DC

## 2024-02-22 NOTE — Telephone Encounter (Signed)
 Copied from CRM [id number]. Topic: Clinical - Red Word Triage >> Feb 22, 2024  8:09 AM Zipporah Him wrote: Red Word that prompted transfer to Nurse Triage: Vaginal Itching and burning, frequency of urination is increasing, started yesterday. Suspects UTI.  Reason for Disposition  Urinating more frequently than usual (i.e., frequency)  Answer Assessment - Initial Assessment Questions 1. SYMPTOM: "What's the main symptom you're concerned about?" (e.g., frequency, incontinence)     Burning and frequencey  2. ONSET: "When did the  burning   start?"   " Acouple of nights   3. PAIN: "Is there any pain?" If Yes, ask: "How bad is it?" (Scale: 1-10; mild, moderate, severe)     Mild to moderate  4. CAUSE: "What do you think is causing the symptoms?"     UTI  5. OTHER SYMPTOMS: "Do you have any other symptoms?" (e.g., blood in urine, fever, flank pain, pain with urination)     Lower abd pain  6. PREGNANCY: "Is there any chance you are pregnant?" "When was your last menstrual period?"     No  Protocols used: Urinary Symptoms-A-AH

## 2024-02-22 NOTE — Progress Notes (Addendum)
 Subjective:  HPI: Allison Freeman is a 78 y.o. female presenting on 02/22/2024 for Urinary Frequency (Frequency w/ burning x 4 days)   Urinary Frequency  Associated symptoms include frequency.   Patient is in today for 4 days nocturia and dysuria. Denies fever, chills, body aches, flank pain, hematuria, vaginal discharge or bleeding, abdominal pain. Symptoms are worsening since last night. Has tried nothing.   Review of Systems  Genitourinary:  Positive for frequency.  All other systems reviewed and are negative.   Relevant past medical history reviewed and updated as indicated.   Past Medical History:  Diagnosis Date  . Acid reflux   . Arthritis   . Depression   . Family history of adverse reaction to anesthesia    Mother woke up and could hearduring surgery  . Hypercholesterolemia   . Hypertension   . Insomnia   . Osteoporosis   . Panic attacks    panic attacks  . Reflux   . Restless leg syndrome      Past Surgical History:  Procedure Laterality Date  . ABDOMINAL HYSTERECTOMY  2001  . BLADDER SURGERY    . BREAST BIOPSY  1987  . BREAST CYST EXCISION  1998  . CARPAL TUNNEL RELEASE Right 04/06/2015   Procedure: CARPAL TUNNEL RELEASE;  Surgeon: Darrin Emerald, MD;  Location: [facility];  Service: Orthopedics;  Laterality: Right;  . CARPAL TUNNEL RELEASE Left 06/30/2022   Procedure: LEFT CARPAL TUNNEL RELEASE;  Surgeon: Brunilda Capra, MD;  Location: [facility];  Service: Orthopedics;  Laterality: Left;  . COLONOSCOPY  09/19/2004   [id number] hemorrhoids, otherwise normal rectum/Sigmoid diverticula.  Remainder of colon mucosa appeared normal  . COLONOSCOPY WITH ESOPHAGOGASTRODUODENOSCOPY (EGD) N/A 06/24/2013   JWJ:[id number] polyp-removed. Small hiatal hernia. Abnormal gastric mucosa-query NSAID effect-s/p (chronic inactive gastritis, negative H. pylori)/TCS: Prominent internal hemorrhoids-likely source of hematochezia. Colonic diverticulosis. Colonic  polyp-removed (tubular adenoma)  . COLONOSCOPY WITH PROPOFOL  N/A 11/01/2018   Procedure: COLONOSCOPY WITH PROPOFOL ;  Surgeon: Suzette Espy, MD;  Location: [facility];  Service: Endoscopy;  Laterality: N/A;  7:30am  . CYSTOCELE REPAIR  2010  . DILATION AND CURETTAGE OF UTERUS    . JOINT REPLACEMENT N/A    Phreesia 03/02/2020  . KNEE SURGERY Right 2003   arthroscopic left  . RECTAL SURGERY    . RECTOCELE REPAIR  2010  . REPLACEMENT TOTAL KNEE Right   . TONSILLECTOMY    . TOTAL KNEE ARTHROPLASTY Right 05/09/2019   Procedure: TOTAL KNEE ARTHROPLASTY;  Surgeon: Christie Cox, MD;  Location: [facility];  Service: Orthopedics;  Laterality: Right;    Allergies and medications reviewed and updated.   Current Outpatient Medications:  .  cephALEXin  (KEFLEX ) 500 MG capsule, Take 1 capsule (500 mg total) by mouth 2 (two) times daily., Disp: 14 capsule, Rfl: 0 .  acyclovir (ZOVIRAX) 400 MG tablet, Take 400 mg by mouth 3 (three) times daily., Disp: , Rfl:  .  ALPRAZolam  (XANAX ) 0.5 MG tablet, TAKE ONE TABLET (0.5MG  TOTAL) BY MOUTH AT BEDTIME, Disp: 30 tablet, Rfl: 3 .  Calcium-Magnesium-Vitamin D  (CALCIUM 1200+D3 PO), Take 1 tablet by mouth daily., Disp: , Rfl:  .  Carboxymethylcellul-Glycerin (LUBRICATING EYE DROPS OP), Place 1 drop into both eyes 2 (two) times daily., Disp: , Rfl:  .  cyclobenzaprine  (FLEXERIL ) 10 MG tablet, TAKE ONE TABLET (10MG  TOTAL) BY MOUTH THREE TIMES DAILY AS NEEDED FOR MUSCLE SPASMS, Disp: 90 tablet, Rfl: 3 .  denosumab  (PROLIA ) 60 MG/ML SOSY injection,  Inject 60 mg into the skin every 6 (six) months. Last injection 09/19/22, Disp: 1 mL, Rfl: 1 .  EYSUVIS 0.25 % SUSP, Apply 1 drop to eye 2 (two) times daily., Disp: , Rfl:  .  famotidine  (PEPCID ) 40 MG tablet, TAKE ONE TABLET (40MG  TOTAL) BY MOUTH DAILY, Disp: 90 tablet, Rfl: 0 .  fluticasone  (FLONASE ) 50 MCG/ACT nasal spray, Use 2 sprays in each  nostril once daily (Patient taking differently: Place 2 sprays into both  nostrils at bedtime.), Disp: 48 g, Rfl: 3 .  gabapentin  (NEURONTIN ) 100 MG capsule, Take 1 capsule (100 mg total) by mouth 3 (three) times daily., Disp: 90 capsule, Rfl: 3 .  levocetirizine (XYZAL) 5 MG tablet, Take 5 mg by mouth daily., Disp: , Rfl:  .  LINZESS  290 MCG CAPS capsule, TAKE ONE CAPSULE ( TOTAL) BY MOUTHDAILY BEFORE BREAKFAST, Disp: 30 capsule, Rfl: 3 .  losartan  (COZAAR ) 100 MG tablet, TAKE ONE TABLET (100 MG TOTAL) BY MOUTH DAILY., Disp: 90 tablet, Rfl: 1 .  losartan  (COZAAR ) 100 MG tablet, TAKE ONE TABLET (100 MG TOTAL) BY MOUTH DAILY., Disp: 90 tablet, Rfl: 1 .  MELATONIN ER PO, Take by mouth., Disp: , Rfl:  .  meloxicam  (MOBIC ) 7.5 MG tablet, TAKE ONE (1) TABLET BY MOUTH EVERY DAY, Disp: 30 tablet, Rfl: 0 .  Multiple Vitamin (MULTIVITAMIN) tablet, Take 1 tablet by mouth daily., Disp: , Rfl:  .  Omega-3 Fatty Acids (FISH OIL) 1200 MG CAPS, Take 1,200 mg by mouth 2 (two) times daily. , Disp: , Rfl:  .  pantoprazole  (PROTONIX ) 40 MG tablet, TAKE ONE TABLET TWICE DAILY BEFORE MEALS, Disp: 180 tablet, Rfl: 0 .  pravastatin  (PRAVACHOL ) 40 MG tablet, TAKE ONE (1) TABLET BY MOUTH EVERY DAY, Disp: 90 tablet, Rfl: 3 .  rOPINIRole  (REQUIP ) 2 MG tablet, Take 1 tablet (2 mg total) by mouth at bedtime., Disp: 90 tablet, Rfl: 3 .  traMADol  (ULTRAM ) 50 MG tablet, Take 1 tablet (50 mg total) by mouth every 6 (six) hours as needed., Disp: 90 tablet, Rfl: 2  No Known Allergies  Objective:   BP 127/87   Pulse 82   Temp 97.7 F (36.5 C)   Ht 5\' 4"  (1.626 m)   Wt 172 lb 9.6 oz (78.3 kg)   SpO2 97%   BMI 29.63 kg/m      02/22/2024    8:35 AM 02/19/2024   10:40 AM 01/27/2024    8:50 AM  Vitals with BMI  Height 5\' 4"  5\' 4"  5\' 5"   Weight 172 lbs 10 oz 168 lbs 173 lbs  BMI 29.61 28.82 28.79  Systolic 127 143 161  Diastolic 87 79 84  Pulse 82 102 76     Physical Exam Vitals and nursing note reviewed.  Constitutional:      Appearance: Normal appearance. She is normal weight.   HENT:     Head: Normocephalic and atraumatic.  Abdominal:     General: There is no distension.     Palpations: Abdomen is soft.     Tenderness: There is no abdominal tenderness. There is no right CVA tenderness or left CVA tenderness.  Skin:    General: Skin is warm and dry.  Neurological:     General: No focal deficit present.     Mental Status: She is alert and oriented to person, place, and time. Mental status is at baseline.  Psychiatric:        Mood and Affect: Mood normal.  Behavior: Behavior normal.        Thought Content: Thought content normal.        Judgment: Judgment normal.    Assessment & Plan:  Dysuria Assessment & Plan: Dysuria with pyuria, will treat with keflex  500mg  BID x7d. Return to office if symptoms persist or worsen.  Orders: -     Urinalysis, Routine w reflex microscopic -     Urine Culture  Other orders -     Cephalexin ; Take 1 capsule (500 mg total) by mouth 2 (two) times daily.  Dispense: 14 capsule; Refill: 0     Follow up plan: Return if symptoms worsen or fail to improve.  Jenelle Mis, FNP

## 2024-02-22 NOTE — Addendum Note (Signed)
 Addended by: Jenelle Mis on: 02/22/2024 10:14 AM   Modules accepted: Orders

## 2024-02-22 NOTE — Assessment & Plan Note (Signed)
 Dysuria with pyuria, will treat with keflex  500mg  BID x7d. Return to office if symptoms persist or worsen.

## 2024-02-23 LAB — URINE CULTURE
MICRO NUMBER:: 16526272
SPECIMEN QUALITY:: ADEQUATE

## 2024-02-24 ENCOUNTER — Ambulatory Visit: Payer: Self-pay | Admitting: Family Medicine

## 2024-02-24 ENCOUNTER — Telehealth: Payer: Self-pay | Admitting: Pharmacy Technician

## 2024-02-24 ENCOUNTER — Other Ambulatory Visit (HOSPITAL_COMMUNITY): Payer: Self-pay

## 2024-02-24 NOTE — Telephone Encounter (Signed)
 Pharmacy Patient Advocate Encounter   Received notification from Onbase that prior authorization for ALPRAZolam  0.5MG  tablets is required/requested.   Insurance verification completed.   The patient is insured through [employer] .   Per test claim: PA required; PA started via CoverMyMeds. KEY [id number] . Please see clinical question(s) below that I am not finding the answer to in their chart and advise.  ICD 10 code, and diagnosis

## 2024-02-25 ENCOUNTER — Other Ambulatory Visit (HOSPITAL_COMMUNITY): Payer: Self-pay

## 2024-03-14 ENCOUNTER — Other Ambulatory Visit: Payer: Self-pay | Admitting: Orthopedic Surgery

## 2024-03-14 ENCOUNTER — Other Ambulatory Visit: Payer: Self-pay | Admitting: Family Medicine

## 2024-03-15 ENCOUNTER — Other Ambulatory Visit: Payer: Self-pay

## 2024-03-15 ENCOUNTER — Telehealth: Payer: Self-pay | Admitting: Family Medicine

## 2024-03-15 MED ORDER — FAMOTIDINE 40 MG PO TABS: 40.0000 mg | ORAL_TABLET | Freq: Every day | ORAL | 0 refills | Status: DC

## 2024-03-15 MED ORDER — CYCLOBENZAPRINE HCL 10 MG PO TABS: ORAL_TABLET | ORAL | 3 refills | Status: AC

## 2024-03-15 NOTE — Telephone Encounter (Signed)
 duplicate

## 2024-03-15 NOTE — Telephone Encounter (Signed)
 Copied from CRM [id number]. Topic: Clinical - Medication Refill >> Mar 15, 2024  1:19 PM Edsel HERO wrote: Medication: cyclobenzaprine  (FLEXERIL ) 10 MG tablet  Has the patient contacted their pharmacy? Yes Pharmacy said request was denied. Provider declined stating Request refused: Request already responded to by other means (e.g. phone or fax). New rx not showing in patients chart.  This is the patient's preferred pharmacy:  [facility] - [place], [place] - [address] [place] [address] Phone: [phone] Fax: [phone]   Is this the correct pharmacy for this prescription? Yes If no, delete pharmacy and type the correct one.   Has the prescription been filled recently? Yes  Is the patient out of the medication? Yes  Has the patient been seen for an appointment in the last year OR does the patient have an upcoming appointment? Yes  Can we respond through MyChart? Yes  Agent: Please be advised that Rx refills may take up to 3 business days. We ask that you follow-up with your pharmacy.

## 2024-03-16 ENCOUNTER — Ambulatory Visit

## 2024-03-18 ENCOUNTER — Ambulatory Visit (INDEPENDENT_AMBULATORY_CARE_PROVIDER_SITE_OTHER): Admitting: Orthopedic Surgery

## 2024-03-18 DIAGNOSIS — M19012 Primary osteoarthritis, left shoulder: Secondary | ICD-10-CM

## 2024-03-18 NOTE — Patient Instructions (Signed)

## 2024-03-20 NOTE — Progress Notes (Signed)
 Return patient Visit  Assessment: Allison Freeman is a 78 y.o. female with the following: 1. Glenohumeral arthritis, left  Plan: Allison Freeman continues to have pain in the left shoulder.  Radiographs previously obtained demonstrates glenohumeral joint arthritis.  Prior subacromial steroid injection provided relief of her symptoms for several months.  Her pain is returned.  She continues to have good range of motion.  I have recommended a glenohumeral joint injection, and this was completed in clinic today.  She will follow-up as needed.  Procedure note injection - Left shoulder, ultrasound guidance   Verbal consent was obtained to inject the Left shoulder, glenohumeral joint  Timeout was completed to confirm the site of injection.   Using the ultrasound, the rotator cuff tendons were identified.  The joint space was also identified. The skin was prepped with alcohol and ethyl chloride was sprayed at the injection site.  A 21-gauge needle was used to inject 40 mg of Depo-Medrol  and 1% lidocaine  (4 cc) into the glenohumeral joint space of the Left shoulder using a posterolateral approach.  The needle was visualized entering the glenohumeral joint, and the medication was also visualized. There were no complications.  A sterile bandage was applied.   Note: In order to accurately identify the placement of the needle, ultrasound was required, to increase the accuracy, and specificity of the injection.   Follow-up: Return if symptoms worsen or fail to improve.  Subjective:  Chief Complaint  Patient presents with   Shoulder Pain    L shoulder     History of Present Illness: Allison Freeman is a 78 y.o. female who returns to clinic for repeat evaluation of left shoulder pain.  She is right-hand dominant.  She continues to have pain in the left shoulder.  Prior subacromial steroid injection provided some improvement in her symptoms, but the pain has returned.  She has difficulty  with overhead motion.  She describes the pain as an ache, deep within her shoulder.   Review of Systems: No fevers or chills No numbness or tingling No chest pain No shortness of breath No bowel or bladder dysfunction No GI distress No headaches    Objective: There were no vitals taken for this visit.  Physical Exam:  General: Alert and oriented. and No acute distress. Gait: Normal gait.  Left shoulder without deformity or swelling.  No bruising.  No redness.  Full forward flexion, with some discomfort.  Internal rotation to T12.  External rotation of 45 degrees, similar to the contralateral side.  She has excellent strength supraspinatus, infraspinatus and subscapularis.  Fingers are warm and well-perfused.  IMAGING: I personally reviewed images previously obtained in clinic  X-rays of the left shoulder were previously obtained.  No acute injuries.  Loss of glenohumeral joint space.  On the AP projection there is an inferior osteophyte.  On the axillary view, osteophytes are appreciated both anterior and posterior.   New Medications:  No orders of the defined types were placed in this encounter.     Oneil DELENA Horde, MD  03/20/2024 10:45 PM

## 2024-04-07 ENCOUNTER — Encounter: Payer: Self-pay | Admitting: Family Medicine

## 2024-04-07 ENCOUNTER — Ambulatory Visit: Admitting: Family Medicine

## 2024-04-07 VITALS — BP 120/62 | HR 80 | Temp 97.4°F | Ht 64.0 in | Wt 172.2 lb

## 2024-04-07 DIAGNOSIS — E7800 Pure hypercholesterolemia, unspecified: Secondary | ICD-10-CM | POA: Diagnosis not present

## 2024-04-07 DIAGNOSIS — I10 Essential (primary) hypertension: Secondary | ICD-10-CM | POA: Diagnosis not present

## 2024-04-07 DIAGNOSIS — Z0001 Encounter for general adult medical examination with abnormal findings: Secondary | ICD-10-CM | POA: Diagnosis not present

## 2024-04-07 DIAGNOSIS — Z0000 Encounter for general adult medical examination without abnormal findings: Secondary | ICD-10-CM

## 2024-04-07 DIAGNOSIS — Z1231 Encounter for screening mammogram for malignant neoplasm of breast: Secondary | ICD-10-CM

## 2024-04-07 NOTE — Progress Notes (Signed)
 Subjective:    Patient ID: Allison Freeman, female    DOB: 31-Jul-1946, 78 y.o.   MRN: [id number]    Patient is a very sweet 78 year old Caucasian female here today for complete physical exam.  Her colonoscopy was performed in 2020 and was clear.  She does not require another.  Due to age she does not have to have a Pap smear.  Mammogram is coming due in August.  Patient is worried about taking a statin.  Several family members have told her that they can cause dementia.  She denies any chest pain shortness of breath or dyspnea.  She has no history of cardiovascular disease. Immunization History  Administered Date(s) Administered   Fluad Quad(high Dose 65+) 08/07/2020, 06/11/2021   Fluad Trivalent(High Dose 65+) 09/07/2023   Influenza,inj,Quad PF,6+ Mos 06/23/2014, 06/27/2015, 06/27/2016   Influenza-Unspecified 06/30/2013, 07/14/2017, 07/06/2018   PFIZER(Purple Top)SARS-COV-2 Vaccination 10/13/2019, 11/03/2019   Pneumococcal Conjugate-13 06/23/2014   Pneumococcal Polysaccharide-23 01/28/2016   Zoster Recombinant(Shingrix) 11/27/2017, 03/01/2018    Past Medical History:  Diagnosis Date   Acid reflux    Arthritis    Depression    Family history of adverse reaction to anesthesia    Mother woke up and could hearduring surgery   Hypercholesterolemia    Hypertension    Insomnia    Osteoporosis    Panic attacks    panic attacks   Reflux    Restless leg syndrome    Past Surgical History:  Procedure Laterality Date   ABDOMINAL HYSTERECTOMY  2001   BLADDER SURGERY     BREAST BIOPSY  1987   BREAST CYST EXCISION  1998   CARPAL TUNNEL RELEASE Right 04/06/2015   Procedure: CARPAL TUNNEL RELEASE;  Surgeon: Taft FORBES Minerva, MD;  Location: [facility];  Service: Orthopedics;  Laterality: Right;   CARPAL TUNNEL RELEASE Left 06/30/2022   Procedure: LEFT CARPAL TUNNEL RELEASE;  Surgeon: Murrell Drivers, MD;  Location: [facility];  Service: Orthopedics;  Laterality: Left;    COLONOSCOPY  09/19/2004   MFM:Pwuzmwjo hemorrhoids, otherwise normal rectum/Sigmoid diverticula.  Remainder of colon mucosa appeared normal   COLONOSCOPY WITH ESOPHAGOGASTRODUODENOSCOPY (EGD) N/A 06/24/2013   MFM:Hjdumpr polyp-removed. Small hiatal hernia. Abnormal gastric mucosa-query NSAID effect-s/p (chronic inactive gastritis, negative H. pylori)/TCS: Prominent internal hemorrhoids-likely source of hematochezia. Colonic diverticulosis. Colonic polyp-removed (tubular adenoma)   COLONOSCOPY WITH PROPOFOL  N/A 11/01/2018   Procedure: COLONOSCOPY WITH PROPOFOL ;  Surgeon: Shaaron Lamar HERO, MD;  Location: [facility];  Service: Endoscopy;  Laterality: N/A;  7:30am   CYSTOCELE REPAIR  2010   DILATION AND CURETTAGE OF UTERUS     JOINT REPLACEMENT N/A    [employer] 03/02/2020   KNEE SURGERY Right 2003   arthroscopic left   RECTAL SURGERY     RECTOCELE REPAIR  2010   REPLACEMENT TOTAL KNEE Right    TONSILLECTOMY     TOTAL KNEE ARTHROPLASTY Right 05/09/2019   Procedure: TOTAL KNEE ARTHROPLASTY;  Surgeon: Rubie Kemps, MD;  Location: [facility];  Service: Orthopedics;  Laterality: Right;   Current Outpatient Medications on File Prior to Visit  Medication Sig Dispense Refill   acyclovir (ZOVIRAX) 400 MG tablet Take 400 mg by mouth 3 (three) times daily.     ALPRAZolam  (XANAX ) 0.5 MG tablet TAKE ONE TABLET (0.5MG  TOTAL) BY MOUTH AT BEDTIME 30 tablet 3   Calcium-Magnesium-Vitamin D  (CALCIUM 1200+D3 PO) Take 1 tablet by mouth daily.     Carboxymethylcellul-Glycerin (LUBRICATING EYE DROPS OP) Place 1 drop into both eyes  2 (two) times daily.     cephALEXin  (KEFLEX ) 500 MG capsule Take 1 capsule (500 mg total) by mouth 2 (two) times daily. 14 capsule 0   cyclobenzaprine  (FLEXERIL ) 10 MG tablet TAKE ONE TABLET (10MG  TOTAL) BY MOUTH THREE TIMES DAILY AS NEEDED FOR MUSCLE SPASMS 90 tablet 3   denosumab  (PROLIA ) 60 MG/ML SOSY injection Inject 60 mg into the skin every 6 (six) months. Last injection 09/19/22 1 mL 1    famotidine  (PEPCID ) 40 MG tablet Take 1 tablet (40 mg total) by mouth daily. 90 tablet 0   fluticasone  (FLONASE ) 50 MCG/ACT nasal spray Use 2 sprays in each  nostril once daily (Patient taking differently: Place 2 sprays into both nostrils at bedtime.) 48 g 3   gabapentin  (NEURONTIN ) 100 MG capsule Take 1 capsule (100 mg total) by mouth 3 (three) times daily. 90 capsule 3   levocetirizine (XYZAL) 5 MG tablet Take 5 mg by mouth daily.     LINZESS  290 MCG CAPS capsule TAKE ONE CAPSULE ( TOTAL) BY MOUTHDAILY BEFORE BREAKFAST 30 capsule 3   MELATONIN ER PO Take by mouth.     meloxicam  (MOBIC ) 7.5 MG tablet TAKE ONE (1) TABLET BY MOUTH EVERY DAY 30 tablet 0   Multiple Vitamin (MULTIVITAMIN) tablet Take 1 tablet by mouth daily.     Omega-3 Fatty Acids (FISH OIL) 1200 MG CAPS Take 1,200 mg by mouth 2 (two) times daily.      pantoprazole  (PROTONIX ) 40 MG tablet TAKE ONE TABLET TWICE DAILY BEFORE MEALS 180 tablet 0   pravastatin  (PRAVACHOL ) 40 MG tablet TAKE ONE (1) TABLET BY MOUTH EVERY DAY 90 tablet 3   rOPINIRole  (REQUIP ) 2 MG tablet Take 1 tablet (2 mg total) by mouth at bedtime. 90 tablet 3   traMADol  (ULTRAM ) 50 MG tablet Take 1 tablet (50 mg total) by mouth every 6 (six) hours as needed. 90 tablet 2   No current facility-administered medications on file prior to visit.   No Known Allergies  Social History   Socioeconomic History   Marital status: Married    Spouse name: Not on file   Number of children: Not on file   Years of education: Not on file   Highest education level: Not on file  Occupational History   Not on file  Tobacco Use   Smoking status: Never   Smokeless tobacco: Never  Vaping Use   Vaping status: Never Used  Substance and Sexual Activity   Alcohol use: No   Drug use: No   Sexual activity: Yes    Birth control/protection: Surgical  Other Topics Concern   Not on file  Social History Narrative   Not on file   Social Drivers of Health   Financial Resource  Strain: Low Risk  (03/05/2023)   Overall Financial Resource Strain (CARDIA)    Difficulty of Paying Living Expenses: Not hard at all  Food Insecurity: No Food Insecurity (03/05/2023)   Hunger Vital Sign    Worried About Running Out of Food in the Last Year: Never true    Ran Out of Food in the Last Year: Never true  Transportation Needs: No Transportation Needs (03/05/2023)   PRAPARE - Administrator, Civil Service (Medical): No    Lack of Transportation (Non-Medical): No  Physical Activity: Insufficiently Active (03/05/2023)   Exercise Vital Sign    Days of Exercise per Week: 3 days    Minutes of Exercise per Session: 30 min  Stress: No Stress Concern  Present (03/05/2023)   [employer] of Occupational Health - Occupational Stress Questionnaire    Feeling of Stress : Not at all  Social Connections: Socially Integrated (03/05/2023)   Social Connection and Isolation Panel    Frequency of Communication with Friends and Family: More than three times a week    Frequency of Social Gatherings with Friends and Family: Three times a week    Attends Religious Services: More than 4 times per year    Active Member of Clubs or Organizations: Yes    Attends Banker Meetings: 1 to 4 times per year    Marital Status: Married  Catering manager Violence: Not At Risk (03/05/2023)   Humiliation, Afraid, Rape, and Kick questionnaire    Fear of Current or Ex-Partner: No    Emotionally Abused: No    Physically Abused: No    Sexually Abused: No   Family History  Problem Relation Age of Onset   Hypertension Mother    Cancer Mother        Breast   Hyperlipidemia Mother    Osteoporosis Mother    Hyperlipidemia Father    Hypertension Father    Heart attack Father    Cancer Sister        Breast   Cancer Sister        breast   Breast cancer Sister    Colon cancer Neg Hx      Review of Systems  Gastrointestinal:  Positive for constipation.  All other systems reviewed and  are negative.      Objective:   Physical Exam Vitals reviewed.  Constitutional:      General: She is not in acute distress.    Appearance: Normal appearance. She is normal weight. She is not ill-appearing, toxic-appearing or diaphoretic.  HENT:     Head: Normocephalic and atraumatic.     Right Ear: Tympanic membrane and ear canal normal. There is no impacted cerumen.     Left Ear: Tympanic membrane and ear canal normal. There is no impacted cerumen.     Nose: Nose normal. No congestion or rhinorrhea.     Mouth/Throat:     Mouth: Mucous membranes are moist.     Pharynx: Oropharynx is clear. No oropharyngeal exudate or posterior oropharyngeal erythema.  Eyes:     General:        Right eye: No discharge.        Left eye: No discharge.     Conjunctiva/sclera: Conjunctivae normal.     Pupils: Pupils are equal, round, and reactive to light.  Neck:     Vascular: No carotid bruit.  Cardiovascular:     Rate and Rhythm: Normal rate and regular rhythm.     Pulses: Normal pulses.     Heart sounds: Normal heart sounds. No murmur heard.    No friction rub. No gallop.  Pulmonary:     Effort: Pulmonary effort is normal. No respiratory distress.     Breath sounds: Normal breath sounds. No stridor. No wheezing, rhonchi or rales.  Abdominal:     General: Abdomen is flat. Bowel sounds are normal. There is no distension.     Palpations: Abdomen is soft. There is no mass.     Tenderness: There is no abdominal tenderness. There is no right CVA tenderness, guarding or rebound.     Hernia: No hernia is present.  Musculoskeletal:        General: Normal range of motion.     Cervical back: Normal  range of motion and neck supple. No rigidity or tenderness.     Right lower leg: No edema.     Left lower leg: No edema.  Lymphadenopathy:     Cervical: No cervical adenopathy.  Skin:    General: Skin is warm.     Coloration: Skin is not jaundiced or pale.     Findings: No bruising, erythema, lesion or  rash.  Neurological:     General: No focal deficit present.     Mental Status: She is alert and oriented to person, place, and time. Mental status is at baseline.     Cranial Nerves: No cranial nerve deficit.     Sensory: No sensory deficit.     Motor: No weakness.     Coordination: Coordination normal.     Gait: Gait normal.     Deep Tendon Reflexes: Reflexes normal.          Assessment & Plan:   Pure hypercholesterolemia - Plan: CT CARDIAC SCORING (SELF PAY ONLY), CBC with Differential/Platelet, Comprehensive metabolic panel with GFR, Lipid panel  Encounter for screening mammogram for malignant neoplasm of breast - Plan: MM 3D SCREENING MAMMOGRAM BILATERAL BREAST  Benign essential HTN  General medical exam Physical exam today is normal.  Blood pressure is excellent.  We discussed the risk of taking a statin.  I believe the risk of dementia or memory loss is extremely low.  I believe the benefit far outweighs any risk.  However we discussed restratification using a coronary artery calcium score.  Patient is very interested in getting this test to determine her risk of cardiovascular disease.  I will schedule the patient also for mammogram.  Colonoscopy is up-to-date.  Check CBC CMP and fasting lipid panel.  Of note, and her review of systems, the patient reports recalcitrant heartburn.  She is currently taking Protonix  twice daily and also famotidine .  She had an EGD in 2014 that did show a small hiatal hernia.  She was having breakthrough reflux even then.  We discussed trying voquezna.  Also recommended elevating the head of her bed 2 inches in decreasing her consumption of meloxicam 

## 2024-04-08 ENCOUNTER — Ambulatory Visit: Admitting: Urology

## 2024-04-08 ENCOUNTER — Encounter: Payer: Self-pay | Admitting: Urology

## 2024-04-08 ENCOUNTER — Ambulatory Visit: Payer: Self-pay | Admitting: Family Medicine

## 2024-04-08 VITALS — BP 137/81 | HR 89

## 2024-04-08 DIAGNOSIS — R682 Dry mouth, unspecified: Secondary | ICD-10-CM

## 2024-04-08 DIAGNOSIS — R300 Dysuria: Secondary | ICD-10-CM | POA: Diagnosis not present

## 2024-04-08 DIAGNOSIS — N3281 Overactive bladder: Secondary | ICD-10-CM

## 2024-04-08 DIAGNOSIS — R350 Frequency of micturition: Secondary | ICD-10-CM | POA: Diagnosis not present

## 2024-04-08 DIAGNOSIS — K5909 Other constipation: Secondary | ICD-10-CM | POA: Diagnosis not present

## 2024-04-08 DIAGNOSIS — H04123 Dry eye syndrome of bilateral lacrimal glands: Secondary | ICD-10-CM | POA: Diagnosis not present

## 2024-04-08 DIAGNOSIS — N952 Postmenopausal atrophic vaginitis: Secondary | ICD-10-CM

## 2024-04-08 DIAGNOSIS — R351 Nocturia: Secondary | ICD-10-CM | POA: Diagnosis not present

## 2024-04-08 DIAGNOSIS — R3915 Urgency of urination: Secondary | ICD-10-CM | POA: Insufficient documentation

## 2024-04-08 DIAGNOSIS — N3941 Urge incontinence: Secondary | ICD-10-CM

## 2024-04-08 DIAGNOSIS — Z87440 Personal history of urinary (tract) infections: Secondary | ICD-10-CM

## 2024-04-08 LAB — CBC WITH DIFFERENTIAL/PLATELET
Absolute Lymphocytes: 2070 {cells}/uL (ref 850–3900)
Absolute Monocytes: 492 {cells}/uL (ref 200–950)
Basophils Absolute: 30 {cells}/uL (ref 0–200)
Basophils Relative: 0.5 %
Eosinophils Absolute: 108 {cells}/uL (ref 15–500)
Eosinophils Relative: 1.8 %
HCT: 39.8 % (ref 35.0–45.0)
Hemoglobin: 13.2 g/dL (ref 11.7–15.5)
MCH: 30.5 pg (ref 27.0–33.0)
MCHC: 33.2 g/dL (ref 32.0–36.0)
MCV: 91.9 fL (ref 80.0–100.0)
MPV: 10.8 fL (ref 7.5–12.5)
Monocytes Relative: 8.2 %
Neutro Abs: 3300 {cells}/uL (ref 1500–7800)
Neutrophils Relative %: 55 %
Platelets: 236 Thousand/uL (ref 140–400)
RBC: 4.33 Million/uL (ref 3.80–5.10)
RDW: 12.9 % (ref 11.0–15.0)
Total Lymphocyte: 34.5 %
WBC: 6 Thousand/uL (ref 3.8–10.8)

## 2024-04-08 LAB — COMPREHENSIVE METABOLIC PANEL WITH GFR
AG Ratio: 1.7 (calc) (ref 1.0–2.5)
ALT: 19 U/L (ref 6–29)
AST: 21 U/L (ref 10–35)
Albumin: 4 g/dL (ref 3.6–5.1)
Alkaline phosphatase (APISO): 38 U/L (ref 37–153)
BUN/Creatinine Ratio: 33 (calc) — ABNORMAL HIGH (ref 6–22)
BUN: 32 mg/dL — ABNORMAL HIGH (ref 7–25)
CO2: 29 mmol/L (ref 20–32)
Calcium: 9.3 mg/dL (ref 8.6–10.4)
Chloride: 103 mmol/L (ref 98–110)
Creat: 0.98 mg/dL (ref 0.60–1.00)
Globulin: 2.3 g/dL (ref 1.9–3.7)
Glucose, Bld: 94 mg/dL (ref 65–99)
Potassium: 4.8 mmol/L (ref 3.5–5.3)
Sodium: 138 mmol/L (ref 135–146)
Total Bilirubin: 1.2 mg/dL (ref 0.2–1.2)
Total Protein: 6.3 g/dL (ref 6.1–8.1)
eGFR: 60 mL/min/1.73m2 (ref >=60)

## 2024-04-08 LAB — URINALYSIS, ROUTINE W REFLEX MICROSCOPIC
Bilirubin, UA: NEGATIVE
Glucose, UA: NEGATIVE
Ketones, UA: NEGATIVE
Nitrite, UA: NEGATIVE
Protein,UA: NEGATIVE
RBC, UA: NEGATIVE
Specific Gravity, UA: 1.025 (ref 1.005–1.030)
Urobilinogen, Ur: 0.2 mg/dL (ref 0.2–1.0)
pH, UA: 6 (ref 5.0–7.5)

## 2024-04-08 LAB — BLADDER SCAN AMB NON-IMAGING: Scan Result: 6

## 2024-04-08 LAB — MICROSCOPIC EXAMINATION: Bacteria, UA: NONE SEEN

## 2024-04-08 LAB — LIPID PANEL
Cholesterol: 172 mg/dL (ref <200)
HDL: 69 mg/dL (ref >=50)
LDL Cholesterol (Calc): 85 mg/dL
Non-HDL Cholesterol (Calc): 103 mg/dL (ref <130)
Total CHOL/HDL Ratio: 2.5 (calc) (ref <5.0)
Triglycerides: 88 mg/dL (ref <150)

## 2024-04-08 MED ORDER — ESTRADIOL 0.1 MG/GM VA CREA: TOPICAL_CREAM | VAGINAL | 3 refills | Status: AC

## 2024-04-08 MED ORDER — GEMTESA 75 MG PO TABS: 1.0000 | ORAL_TABLET | Freq: Every day | ORAL | 11 refills | Status: AC

## 2024-04-08 NOTE — Progress Notes (Signed)
 Name: Allison Freeman DOB: 04/26/1946 MRN: [id number]  History of Present Illness: Allison Freeman is a 78 y.o. female who presents today for follow up visit at [facility].  Relevant History includes: 1. OAB with urinary urge incontinence. - Previously did PTNS. 2. Vaginal atrophy.  - Previously used topical vaginal estrogen cream.  At last visit with Dr. Watt on 07/24/2022: The plan was for Gemtesa  daily and follow up in 1 year.  Today: She reports that PTNS worked well in the past. She was unable to continue Gemtesa  due to cost but it was helpful.   She reports urinary frequency, nocturia x2-3, urgency, urge incontinence, and terminal dribbling. Currently not wearing pads or diapers but has had to change clothing at times due to leakage.   She reports drinking about 3 caffeinated beverages per day on average.  She denies dysuria, gross hematuria, straining to void, or sensations of incomplete emptying.  Medications: Current Outpatient Medications  Medication Sig Dispense Refill   acyclovir (ZOVIRAX) 400 MG tablet Take 400 mg by mouth 3 (three) times daily.     ALPRAZolam  (XANAX ) 0.5 MG tablet TAKE ONE TABLET (0.5MG  TOTAL) BY MOUTH AT BEDTIME 30 tablet 3   Calcium-Magnesium-Vitamin D  (CALCIUM 1200+D3 PO) Take 1 tablet by mouth daily.     Carboxymethylcellul-Glycerin (LUBRICATING EYE DROPS OP) Place 1 drop into both eyes 2 (two) times daily.     denosumab  (PROLIA ) 60 MG/ML SOSY injection Inject 60 mg into the skin every 6 (six) months. Last injection 09/19/22 1 mL 1   estradiol (ESTRACE) 0.1 MG/GM vaginal cream Discard plastic applicator. Insert a blueberry size amount (approximately 1 gram) of cream on fingertip inside vagina at bedtime every night for 1 week then every other night. For long term use. 30 g 3   famotidine  (PEPCID ) 40 MG tablet Take 1 tablet (40 mg total) by mouth daily. 90 tablet 0   fluticasone  (FLONASE ) 50 MCG/ACT nasal spray Use 2 sprays in  each  nostril once daily (Patient taking differently: Place 2 sprays into both nostrils at bedtime.) 48 g 3   gabapentin  (NEURONTIN ) 100 MG capsule Take 1 capsule (100 mg total) by mouth 3 (three) times daily. 90 capsule 3   levocetirizine (XYZAL) 5 MG tablet Take 5 mg by mouth daily.     LINZESS  290 MCG CAPS capsule TAKE ONE CAPSULE ( TOTAL) BY MOUTHDAILY BEFORE BREAKFAST 30 capsule 3   MELATONIN ER PO Take by mouth.     meloxicam  (MOBIC ) 7.5 MG tablet TAKE ONE (1) TABLET BY MOUTH EVERY DAY 30 tablet 0   Multiple Vitamin (MULTIVITAMIN) tablet Take 1 tablet by mouth daily.     Omega-3 Fatty Acids (FISH OIL) 1200 MG CAPS Take 1,200 mg by mouth 2 (two) times daily.      pantoprazole  (PROTONIX ) 40 MG tablet TAKE ONE TABLET TWICE DAILY BEFORE MEALS 180 tablet 0   pravastatin  (PRAVACHOL ) 40 MG tablet TAKE ONE (1) TABLET BY MOUTH EVERY DAY 90 tablet 3   rOPINIRole  (REQUIP ) 2 MG tablet Take 1 tablet (2 mg total) by mouth at bedtime. 90 tablet 3   traMADol  (ULTRAM ) 50 MG tablet Take 1 tablet (50 mg total) by mouth every 6 (six) hours as needed. 90 tablet 2   Vibegron  (GEMTESA ) 75 MG TABS Take 1 tablet (75 mg total) by mouth daily. 30 tablet 11   cyclobenzaprine  (FLEXERIL ) 10 MG tablet TAKE ONE TABLET (10MG  TOTAL) BY MOUTH THREE TIMES DAILY AS NEEDED FOR MUSCLE SPASMS (  Patient not taking: Reported on 04/08/2024) 90 tablet 3   No current facility-administered medications for this visit.    Allergies: No Known Allergies  Past Medical History:  Diagnosis Date   Acid reflux    Arthritis    Depression    Family history of adverse reaction to anesthesia    Mother woke up and could hearduring surgery   Hypercholesterolemia    Hypertension    Insomnia    Osteoporosis    Panic attacks    panic attacks   Reflux    Restless leg syndrome    Past Surgical History:  Procedure Laterality Date   ABDOMINAL HYSTERECTOMY  2001   BLADDER SURGERY     BREAST BIOPSY  1987   BREAST CYST EXCISION  1998    CARPAL TUNNEL RELEASE Right 04/06/2015   Procedure: CARPAL TUNNEL RELEASE;  Surgeon: Taft FORBES Minerva, MD;  Location: [facility];  Service: Orthopedics;  Laterality: Right;   CARPAL TUNNEL RELEASE Left 06/30/2022   Procedure: LEFT CARPAL TUNNEL RELEASE;  Surgeon: Murrell Drivers, MD;  Location: [facility];  Service: Orthopedics;  Laterality: Left;   COLONOSCOPY  09/19/2004   MFM:Pwuzmwjo hemorrhoids, otherwise normal rectum/Sigmoid diverticula.  Remainder of colon mucosa appeared normal   COLONOSCOPY WITH ESOPHAGOGASTRODUODENOSCOPY (EGD) N/A 06/24/2013   MFM:Hjdumpr polyp-removed. Small hiatal hernia. Abnormal gastric mucosa-query NSAID effect-s/p (chronic inactive gastritis, negative H. pylori)/TCS: Prominent internal hemorrhoids-likely source of hematochezia. Colonic diverticulosis. Colonic polyp-removed (tubular adenoma)   COLONOSCOPY WITH PROPOFOL  N/A 11/01/2018   Procedure: COLONOSCOPY WITH PROPOFOL ;  Surgeon: Shaaron Lamar HERO, MD;  Location: [facility];  Service: Endoscopy;  Laterality: N/A;  7:30am   CYSTOCELE REPAIR  2010   DILATION AND CURETTAGE OF UTERUS     JOINT REPLACEMENT N/A    Phreesia 03/02/2020   KNEE SURGERY Right 2003   arthroscopic left   RECTAL SURGERY     RECTOCELE REPAIR  2010   REPLACEMENT TOTAL KNEE Right    TONSILLECTOMY     TOTAL KNEE ARTHROPLASTY Right 05/09/2019   Procedure: TOTAL KNEE ARTHROPLASTY;  Surgeon: Rubie Kemps, MD;  Location: [facility];  Service: Orthopedics;  Laterality: Right;   Family History  Problem Relation Age of Onset   Hypertension Mother    Cancer Mother        Breast   Hyperlipidemia Mother    Osteoporosis Mother    Hyperlipidemia Father    Hypertension Father    Heart attack Father    Cancer Sister        Breast   Cancer Sister        breast   Breast cancer Sister    Colon cancer Neg Hx    Social History   Socioeconomic History   Marital status: Married    Spouse name: Not on file   Number of children: Not on  file   Years of education: Not on file   Highest education level: Not on file  Occupational History   Not on file  Tobacco Use   Smoking status: Never   Smokeless tobacco: Never  Vaping Use   Vaping status: Never Used  Substance and Sexual Activity   Alcohol use: No   Drug use: No   Sexual activity: Yes    Birth control/protection: Surgical  Other Topics Concern   Not on file  Social History Narrative   Not on file   Social Drivers of Health   Financial Resource Strain: Low Risk  (03/05/2023)   Overall Financial  Resource Strain (CARDIA)    Difficulty of Paying Living Expenses: Not hard at all  Food Insecurity: No Food Insecurity (03/05/2023)   Hunger Vital Sign    Worried About Running Out of Food in the Last Year: Never true    Ran Out of Food in the Last Year: Never true  Transportation Needs: No Transportation Needs (03/05/2023)   PRAPARE - Administrator, Civil Service (Medical): No    Lack of Transportation (Non-Medical): No  Physical Activity: Insufficiently Active (03/05/2023)   Exercise Vital Sign    Days of Exercise per Week: 3 days    Minutes of Exercise per Session: 30 min  Stress: No Stress Concern Present (03/05/2023)   [employer] of Occupational Health - Occupational Stress Questionnaire    Feeling of Stress : Not at all  Social Connections: Socially Integrated (03/05/2023)   Social Connection and Isolation Panel    Frequency of Communication with Friends and Family: More than three times a week    Frequency of Social Gatherings with Friends and Family: Three times a week    Attends Religious Services: More than 4 times per year    Active Member of Clubs or Organizations: Yes    Attends Banker Meetings: 1 to 4 times per year    Marital Status: Married  Catering manager Violence: Not At Risk (03/05/2023)   Humiliation, Afraid, Rape, and Kick questionnaire    Fear of Current or Ex-Partner: No    Emotionally Abused: No     Physically Abused: No    Sexually Abused: No    Review of Systems Constitutional: Patient denies any unintentional weight loss or change in strength lntegumentary: Patient denies any rashes or pruritus Eyes: Patient reports dry eyes ENT: Patient reports dry mouth Cardiovascular: Patient denies chest pain or syncope Respiratory: Patient denies shortness of breath Gastrointestinal: Patient reports constipation Musculoskeletal: Patient denies muscle cramps or weakness Neurologic: Patient denies convulsions or seizures Allergic/Immunologic: Patient denies recent allergic reaction(s) Hematologic/Lymphatic: Patient denies bleeding tendencies Endocrine: Patient denies heat/cold intolerance  GU: As per HPI.  OBJECTIVE Vitals:   04/08/24 1031  BP: 137/81  Pulse: 89   There is no height or weight on file to calculate BMI.  Physical Examination Constitutional: No obvious distress; patient is non-toxic appearing  Cardiovascular: No visible lower extremity edema.  Respiratory: The patient does not have audible wheezing/stridor; respirations do not appear labored  Gastrointestinal: Abdomen non-distended Musculoskeletal: Normal ROM of UEs  Skin: No obvious rashes/open sores  Neurologic: CN 2-12 grossly intact Psychiatric: Answered questions appropriately with normal affect  Hematologic/Lymphatic/Immunologic: No obvious bruises or sites of spontaneous bleeding  Urine microscopy: unremarkable PVR: 6 ml  ASSESSMENT Overactive bladder - Plan: BLADDER SCAN AMB NON-IMAGING, Urinalysis, Routine w reflex microscopic, estradiol (ESTRACE) 0.1 MG/GM vaginal cream, Vibegron  (GEMTESA ) 75 MG TABS  Urge incontinence - Plan: estradiol (ESTRACE) 0.1 MG/GM vaginal cream, Vibegron  (GEMTESA ) 75 MG TABS  Personal history of urinary infection - Plan: estradiol (ESTRACE) 0.1 MG/GM vaginal cream  Vaginal atrophy - Plan: estradiol (ESTRACE) 0.1 MG/GM vaginal cream  Dysuria - Plan: estradiol (ESTRACE) 0.1  MG/GM vaginal cream  Urinary urgency - Plan: estradiol (ESTRACE) 0.1 MG/GM vaginal cream, Vibegron  (GEMTESA ) 75 MG TABS  Urinary frequency - Plan: estradiol (ESTRACE) 0.1 MG/GM vaginal cream, Vibegron  (GEMTESA ) 75 MG TABS  Nocturia - Plan: estradiol (ESTRACE) 0.1 MG/GM vaginal cream, Vibegron  (GEMTESA ) 75 MG TABS  Chronic constipation - Plan: Vibegron  (GEMTESA ) 75 MG TABS  Dry mouth and eyes -  Plan: Vibegron  (GEMTESA ) 75 MG TABS  We agreed to restart Gemtesa  (Vibegron ) 75 mg daily with samples provided today. She has currently met her deductible anticipates a $0 copay for that medication; may need samples at the beginning of the next calendar year while in the insurance donut hole. For post-menopausal vaginal atrophic changes and urinary urgency/frequency, agreed to restart topical vaginal estrogen cream 2-3 nights per week. Will also ask our staff to look into if her insurance will cover PTNS again at this point. Will plan for follow up in 3 months or sooner if needed. Patient verbalized understanding and agreement. All questions were answered.  PLAN Advised the following: 1. Gemtesa  (Vibegron ) 75 mg daily.  2. Topical vaginal estrogen cream 2-3 nights per week.  3. Minimize / avoid caffeine intake. 4. Return in about 3 months (around 07/09/2024) for OAB, with UA & PVR.  Orders Placed This Encounter  Procedures   Urinalysis, Routine w reflex microscopic   BLADDER SCAN AMB NON-IMAGING    It has been explained that the patient is to follow regularly with their PCP in addition to all other providers involved in their care and to follow instructions provided by these respective offices. Patient advised to contact urology clinic if any urologic-pertaining questions, concerns, new symptoms or problems arise in the interim period.  Patient Instructions       UTI prevention / management:  UTI symptoms may include:  - Pain / burning / discomfort when urinating - Recent increase in  urinary urgency (how quickly you feel like you need to rush to the bathroom) - Recent increase in urinary frequency (how often you are urinating) - Fever - Acute mental status change / confusion - Fatigue / Feeling tired - Weakness - Note: Urine color, clarity, and odor are not considered to be clinically significant indicators of UTI and do not warrant urine testing unless patient is also experiencing UTI symptoms such as those listed above.  Difference between Urinalysis (urine dipstick test) and Urine culture / Why urine culture often needed to determine appropriate diagnosis and treatment of urologic symptoms: > Urinalysis (urine dipstick test): A quick office test used as an indicator to determine whether or not further testing is necessary (such as a urine culture, urine microscopy, etc.) The urinalysis cannot differentiate a true bacterial UTI or give a definitive diagnosis for the findings.  > Urine culture: May be performed based on the findings of a urinalysis to evaluate for UTI. Grows out on a petri dish for 48-72 hours. Provides important information about: whether or not bacterial growth is present and if so: what the predominant bacteria is which antibiotics will work best against that bacteria That information is important so that we can diagnose and treat patients appropriately as there are other conditions which may mimic UTls which must not be missed (such as cancer, interstitial cystitis, stones, etc.). Assists us  with antibiotic stewardship to minimize patient's risk for developing antibiotic resistance (getting to a point where no antibiotics work anymore).  Options when UTI symptoms occur: 1. Call [facility] and request to speak with triage nurse (phone # [phone], select option 3). In accordance with clinic guidelines the nurse will determine next steps based on patient-reported symptoms, which may include: same-day lab visit to provide urine  specimen, recommendation to schedule Urology office visit appointment for further evaluation, recommendation to proceed to ER, etc. 2. Call your Primary Care Provider (PCP) office to request urgent / same-day visit. Be sure to request  for urine culture to be ordered and have results faxed to Urology (fax # [phone]).  3. Go to urgent care. Be sure to request for urine culture to be ordered and have results faxed to Urology (fax # [phone]).   For bladder pain/ burning with urination: - Can take over-the-counter Pyridium (phenazopyridine; commonly known under the AZO brand) for a few days as needed. Limit use to no more than 3 days consecutively due to risk for methemoglobinemia, liver function issues, and bone health damage with long term use of Pyridium. - Alternative: Prescription urinary analgesics (such as Uribel, Urogesic blue, Urelle, Uro-MP). Often expensive / poorly covered by insurance unfortunately.  Options / recommendations for UTI prevention: - Topical vaginal estrogen for vaginal atrophy (aka Genitourinary Syndrome of Menopause (GSM)). - Adequate daily fluid intake to flush out the urinary tract. - Go to the bathroom to urinate every 4-6 hours while awake to minimize urinary stasis / bacterial overgrowth in the bladder. - Proanthocyanidin (PAC) supplement 36 mg daily; must be soluble (insoluble form of PAC will be ineffective). Recommended brand: Ellura. This is an over-the-counter supplement (often must be found/ purchased online) supplement derived from cranberries with concentrated active component: Proanthocyanidin (PAC) 36 mg daily. Decreases bacterial adherence to bladder lining.  - D-mannose powder (2 grams daily). This is an over-the-counter supplement which decreases bacterial adherence to bladder lining (it is a sugar that inhibits bacterial adherence to urothelial cells by binding to the pili of enteric bacteria). Take as per manufacturer recommendation. Can be used as  an alternative or in addition to the concentrated cranberry supplement.  - Vitamin C supplement to acidify urine to minimize bacterial growth.  - Probiotic to maintain healthy vaginal microbiome to suppress bacteria at urethral opening. Brand recommendations: Verlon (includes probiotic & D-mannose ), Feminine Balance (highest concentration of lactobacillus) or Hyperbiotic Pro 15.  Note for patients with diabetes:  - Be aware that D-mannose contains sugar.  Note for patients with interstitial cystitis (IC):  - Patients with IC should typically avoid cranberry/ PAC supplements and Vitamin C supplements due to their acidity, which may exacerbate IC-related bladder pain. - Symptoms of true bacterial UTI can overlap / mimic symptoms of an IC flare up. Antibiotic use is NOT indicated for IC flare ups. Urine culture needed prior to antibiotic treatment for IC patients. The goal is to minimize your risk for developing antibiotic-resistant bacteria.    Vaginal atrophy I Genitourinary syndrome of menopause (GSM):  What it is: Changes in the vaginal environment (including the vulva and urethra) including: Thinning of the epithelium (skin/ mucosa surface) Can contribute to urinary urgency and frequency Can contribute to dryness, itching, irritation of the vulvar and vaginal tissue Can contribute to pain with intercourse Can contribute to physical changes of the labia, vulva, and vagina such as: Narrowing of the vaginal opening Decreased vaginal length Loss of labial architecture Labial adhesions Pale color of vulvovaginal tissue Loss of pubic hair Allows bacteria to become adherent Results in increased risk for urinary tract infection (UTI) due to bacterial overgrowth and migration up the urethra into the bladder Change in vaginal pH (acid/ base balance) Allows for alteration / disruption of the normal bacterial flora / microbiome Results in increased risk for urinary tract infection (UTI) due to  bacterial overgrowth  Treatment options: Over-the-counter lubricants (see list below). Prescription vaginal estrogen replacement. Options: Topical vaginal estrogen (estradiol) cream: (Estrace, Premarin, compounded) Apply as directed Estring vaginal ring Exchanged every 3 months (either at home or in  office by provider) Vagifem vaginal tablet Inserted nightly for 2 weeks then twice a week (long term) lntrarosa vaginal suppository Vaginal DHEA: converts to estrogen in vaginal tissue without systemic effect Inserted nightly (long term) 3. Vaginal laser therapy (Mona Olam touch) Performed in 3 treatments each 6 weeks apart (available in our [place] office). Can feel like a sunburn for 3-4 days after each treatment until new skin heals in. Usually not covered by insurance. Estimated cost is $1500 for all 3 sessions.  FYI regarding prescription vaginal estrogen treatment options: - All topical vaginal estrogen replacement options are equivalent in terms of efficacy. - Topical vaginal estrogen replacement will take about 3 months to be effective. - OK to have sex with any of the topical vaginal estrogen replacement options. - Topical vaginal estrogen replacement may sting/burn initially due to severe dryness, which will improve with ongoing treatment. - Studies have demonstrated negligible systemic absorption of low-dose intravaginal estrogen cream therefore the risk for cancer development or recurrence with this medication is minimal.  Genitourinary Syndrome of Menopause: AUA/SUFU/AUGS Guideline (2025) [place].at  Topical vaginal estrogen cream safe to use with breast cancer history [email or web address]  Topical vaginal estrogen cream safe to use with blood clot  history [email or web address]   Lubricants and Moisturizers for Treating Genitourinary Syndrome of Menopause and Vulvovaginal Atrophy Treatment Comments I Available Products   Lubricants   Water -based Ingredients: Deionized water , glycerin, propylene glycol; latex safe; rare irritation; dry out with extended sexual activity Astroglide, Good Clean Love, K-Y Jelly, Natural, Organic, Pink, Sliquid, Sylk, Yes    Oil Based Ingredients: avocado, olive, peanut, corn; latex safe; can be used with silicone products; staining; safe (unless peanut allergy); non-irritating Coconut oil, vegetable oil, vitamin E oil  Silicone-Based Ingredients: Silicone polymers; staining; typically nonirritating, long lasting; waterproof; should not be used with silicone dilators, sexual toys, or gynecologic products Astroglide X, Oceanus Ultra Pure, Pink Silicone, Pjur Eros, Replens Silky Smooth, Silicone Premium JO, SKYN, Uberlube, [employer] Based Minimize harm to sperm motility; designed for couples trying to conceive:  Astroglide TTC, Conceive Plus, PreSeed, Yes Baby  Fertility Friendly Minimize harm to sperm motility; designed for couples trying to conceive:  Astroglide, TTC, Conceive Plus, PreSeed, Yes Baby  Vaginal Moisturizers   Vaginal Moisturizers For maintenance use 1 to 3 times weekly; can benefit women with dryness, chafing with AOL, and recurrent vaginal infections irrespective of sexual activity timing Balance Active Menopause Vaginal Moisturizing Lubricant, Canesintima Intimate Moisturizer, Replens, Rephresh, Sylk Natural Intimate Moisturizer, Yes Vaginal Moisturizer  Hybrids Properties of both water  and silicone-based products (combination of a vaginal lubricant and moisturizer); Non-irritating; good option for women with allergies and  sensitivities Lubrigyn, Luvena  Suppositories Hyaluronic acid to retain moisture Revaree  Vulvar Soothing Creams/Oils    Medicated Creams Pain and burn relief; Ingredients: 4% Lidocaine , Aloe Vera gel Releveum ([place])  Non-Medicated Creams For anti-itch and moisture/maintenance; Ingredients: Coconut oil, Avocado oil, Shea Butter, Olive oil, Vitamin E Vajuvenate, Vmagic  Oils for moisture/maintenance: Coconut oil, Vitamin E oil, Emu oil     Electronically signed by:  Lauraine JAYSON Oz, FNP   04/08/24    11:28 AM

## 2024-04-08 NOTE — Patient Instructions (Signed)
 UTI prevention / management:  UTI symptoms may include:  - Pain / burning / discomfort when urinating - Recent increase in urinary urgency (how quickly you feel like you need to rush to the bathroom) - Recent increase in urinary frequency (how often you are urinating) - Fever - Acute mental status change / confusion - Fatigue / Feeling tired - Weakness - Note: Urine color, clarity, and odor are not considered to be clinically significant indicators of UTI and do not warrant urine testing unless patient is also experiencing UTI symptoms such as those listed above.  Difference between Urinalysis (urine dipstick test) and Urine culture / Why urine culture often needed to determine appropriate diagnosis and treatment of urologic symptoms: > Urinalysis (urine dipstick test): A quick office test used as an indicator to determine whether or not further testing is necessary (such as a urine culture, urine microscopy, etc.) The urinalysis cannot differentiate a true bacterial UTI or give a definitive diagnosis for the findings.  > Urine culture: May be performed based on the findings of a urinalysis to evaluate for UTI. Grows out on a petri dish for 48-72 hours. Provides important information about: whether or not bacterial growth is present and if so: what the predominant bacteria is which antibiotics will work best against that bacteria That information is important so that we can diagnose and treat patients appropriately as there are other conditions which may mimic UTls which must not be missed (such as cancer, interstitial cystitis, stones, etc.). Assists us  with antibiotic stewardship to minimize patient's risk for developing antibiotic resistance (getting to a point where no antibiotics work anymore).  Options when UTI symptoms occur: 1. Call [facility] [place] and request to speak with triage nurse (phone # [phone], select option 3). In accordance with clinic guidelines  the nurse will determine next steps based on patient-reported symptoms, which may include: same-day lab visit to provide urine specimen, recommendation to schedule Urology office visit appointment for further evaluation, recommendation to proceed to ER, etc. 2. Call your Primary Care Provider (PCP) office to request urgent / same-day visit. Be sure to request for urine culture to be ordered and have results faxed to Urology (fax # [phone]).  3. Go to urgent care. Be sure to request for urine culture to be ordered and have results faxed to Urology (fax # [phone]).   For bladder pain/ burning with urination: - Can take over-the-counter Pyridium  (phenazopyridine ; commonly known under the AZO brand) for a few days as needed. Limit use to no more than 3 days consecutively due to risk for methemoglobinemia, liver function issues, and bone health damage with long term use of Pyridium . - Alternative: Prescription urinary analgesics (such as Uribel, Urogesic blue, Urelle, Uro-MP). Often expensive / poorly covered by insurance unfortunately.  Options / recommendations for UTI prevention: - Topical vaginal estrogen for vaginal atrophy (aka Genitourinary Syndrome of Menopause (GSM)). - Adequate daily fluid intake to flush out the urinary tract. - Go to the bathroom to urinate every 4-6 hours while awake to minimize urinary stasis / bacterial overgrowth in the bladder. - Proanthocyanidin (PAC) supplement 36 mg daily; must be soluble (insoluble form of PAC will be ineffective). Recommended brand: Ellura. This is an over-the-counter supplement (often must be found/ purchased online) supplement derived from cranberries with concentrated active component: Proanthocyanidin (PAC) 36 mg daily. Decreases bacterial adherence to bladder lining.  - D-mannose powder (2 grams daily). This is an over-the-counter supplement which decreases bacterial adherence to bladder lining (it  is a sugar that inhibits bacterial  adherence to urothelial cells by binding to the pili of enteric bacteria). Take as per manufacturer recommendation. Can be used as an alternative or in addition to the concentrated cranberry supplement.  - Vitamin C supplement to acidify urine to minimize bacterial growth.  - Probiotic to maintain healthy vaginal microbiome to suppress bacteria at urethral opening. Brand recommendations: Estill Hemming (includes probiotic & D-mannose ), Feminine Balance (highest concentration of lactobacillus) or Hyperbiotic Pro 15.  Note for patients with diabetes:  - Be aware that D-mannose contains sugar.  Note for patients with interstitial cystitis (IC):  - Patients with IC should typically avoid cranberry/ PAC supplements and Vitamin C supplements due to their acidity, which may exacerbate IC-related bladder pain. - Symptoms of true bacterial UTI can overlap / mimic symptoms of an IC flare up. Antibiotic use is NOT indicated for IC flare ups. Urine culture needed prior to antibiotic treatment for IC patients. The goal is to minimize your risk for developing antibiotic-resistant bacteria.    Vaginal atrophy I Genitourinary syndrome of menopause (GSM):  What it is: Changes in the vaginal environment (including the vulva and urethra) including: Thinning of the epithelium (skin/ mucosa surface) Can contribute to urinary urgency and frequency Can contribute to dryness, itching, irritation of the vulvar and vaginal tissue Can contribute to pain with intercourse Can contribute to physical changes of the labia, vulva, and vagina such as: Narrowing of the vaginal opening Decreased vaginal length Loss of labial architecture Labial adhesions Pale color of vulvovaginal tissue Loss of pubic hair Allows bacteria to become adherent Results in increased risk for urinary tract infection (UTI) due to bacterial overgrowth and migration up the urethra into the bladder Change in vaginal pH (acid/ base balance) Allows for  alteration / disruption of the normal bacterial flora / microbiome Results in increased risk for urinary tract infection (UTI) due to bacterial overgrowth  Treatment options: Over-the-counter lubricants (see list below). Prescription vaginal estrogen replacement. Options: Topical vaginal estrogen (estradiol) cream: (Estrace, Premarin, compounded) Apply as directed Estring vaginal ring Exchanged every 3 months (either at home or in office by provider) Vagifem vaginal tablet Inserted nightly for 2 weeks then twice a week (long term) lntrarosa vaginal suppository Vaginal DHEA: converts to estrogen in vaginal tissue without systemic effect Inserted nightly (long term) 3. Vaginal laser therapy (Mona Edwina Gram touch) Performed in 3 treatments each 6 weeks apart (available in our [place] office). Can feel like a sunburn for 3-4 days after each treatment until new skin heals in. Usually not covered by insurance. Estimated cost is $1500 for all 3 sessions.  FYI regarding prescription vaginal estrogen treatment options: - All topical vaginal estrogen replacement options are equivalent in terms of efficacy. - Topical vaginal estrogen replacement will take about 3 months to be effective. - OK to have sex with any of the topical vaginal estrogen replacement options. - Topical vaginal estrogen replacement may sting/burn initially due to severe dryness, which will improve with ongoing treatment. - Studies have demonstrated negligible systemic absorption of low-dose intravaginal estrogen cream therefore the risk for cancer development or recurrence with this medication is minimal.  Genitourinary Syndrome of Menopause: AUA/SUFU/AUGS Guideline (2025) [place]Info.at  Topical vaginal estrogen cream safe to use with breast cancer  history [email or web address].[email or web address]  Topical vaginal estrogen cream safe to use with blood clot history [email or web address]   Lubricants and Moisturizers for Treating Genitourinary Syndrome of Menopause and Vulvovaginal Atrophy Treatment Comments I Available Products   Lubricants   Water -based  Ingredients: Deionized water , glycerin , propylene glycol; latex safe; rare irritation; dry out with extended sexual activity Astroglide, Good Clean Love, K-Y Jelly, Natural, Organic, Pink, Sliquid, Sylk, Yes    Oil Based Ingredients: avocado, olive, peanut, corn; latex safe; can be used with silicone products; staining; safe (unless peanut allergy); non-irritating Coconut oil, vegetable oil, vitamin E oil  Silicone-Based Ingredients: Silicone polymers; staining; typically nonirritating, long lasting; waterproof; should not be used with silicone dilators, sexual toys, or gynecologic products Astroglide X, Oceanus Ultra Pure, Pink Silicone, Pjur Eros, Replens Silky Smooth, Silicone Premium JO, SKYN, Uberlube, [employer] Based Minimize harm to sperm motility; designed for couples trying to conceive:  Astroglide TTC, Conceive Plus, PreSeed, Yes Baby  Fertility Friendly Minimize harm to sperm motility; designed for couples trying to conceive:  Astroglide, TTC, Conceive Plus, PreSeed, Yes Baby  Vaginal Moisturizers   Vaginal Moisturizers For maintenance use 1 to 3 times weekly; can benefit women with dryness, chafing with AOL, and recurrent vaginal infections irrespective of sexual activity timing Balance Active Menopause Vaginal Moisturizing Lubricant, Canesintima Intimate Moisturizer, Replens, Rephresh, Sylk Natural Intimate Moisturizer, Yes Vaginal  Moisturizer  Hybrids Properties of both water  and silicone-based products (combination of a vaginal lubricant and moisturizer); Non-irritating; good option for women with allergies and sensitivities Lubrigyn, Luvena  Suppositories Hyaluronic acid to retain moisture Revaree  Vulvar Soothing Creams/Oils    Medicated Creams Pain and burn relief; Ingredients: 4% Lidocaine , Aloe Vera gel Releveum (Desert Titanic)  Non-Medicated Creams For anti-itch and moisture/maintenance; Ingredients: Coconut oil, Avocado oil, Shea Butter, Olive oil, Vitamin E Vajuvenate, Vmagic  Oils for moisture/maintenance: Coconut oil, Vitamin E oil, Emu oil

## 2024-04-08 NOTE — Progress Notes (Signed)
 Pt. Informed by phone.

## 2024-04-15 ENCOUNTER — Ambulatory Visit (HOSPITAL_COMMUNITY)
Admission: RE | Admit: 2024-04-15 | Discharge: 2024-04-15 | Disposition: A | Discharge status: Home / Self Care | Payer: Self-pay | Source: Ambulatory Visit | Attending: Family Medicine | Admitting: Family Medicine

## 2024-04-15 DIAGNOSIS — E7800 Pure hypercholesterolemia, unspecified: Secondary | ICD-10-CM | POA: Insufficient documentation

## 2024-04-19 ENCOUNTER — Other Ambulatory Visit: Payer: Self-pay | Admitting: Family Medicine

## 2024-04-19 DIAGNOSIS — M12812 Other specific arthropathies, not elsewhere classified, left shoulder: Secondary | ICD-10-CM

## 2024-04-19 DIAGNOSIS — M19012 Primary osteoarthritis, left shoulder: Secondary | ICD-10-CM

## 2024-04-19 DIAGNOSIS — M7061 Trochanteric bursitis, right hip: Secondary | ICD-10-CM

## 2024-04-25 ENCOUNTER — Other Ambulatory Visit: Payer: Self-pay | Admitting: Family Medicine

## 2024-04-25 ENCOUNTER — Other Ambulatory Visit: Payer: Self-pay | Admitting: Orthopedic Surgery

## 2024-04-25 DIAGNOSIS — K5900 Constipation, unspecified: Secondary | ICD-10-CM

## 2024-04-29 ENCOUNTER — Other Ambulatory Visit (HOSPITAL_COMMUNITY): Payer: Self-pay

## 2024-05-09 ENCOUNTER — Ambulatory Visit (INDEPENDENT_AMBULATORY_CARE_PROVIDER_SITE_OTHER): Admitting: Family Medicine

## 2024-05-09 ENCOUNTER — Encounter: Payer: Self-pay | Admitting: Family Medicine

## 2024-05-09 VITALS — BP 136/82 | HR 76 | Temp 97.7°F | Ht 64.0 in | Wt 174.0 lb

## 2024-05-09 DIAGNOSIS — R140 Abdominal distension (gaseous): Secondary | ICD-10-CM | POA: Diagnosis not present

## 2024-05-09 DIAGNOSIS — R1084 Generalized abdominal pain: Secondary | ICD-10-CM

## 2024-05-09 MED ORDER — METRONIDAZOLE 500 MG PO TABS: 500.0000 mg | ORAL_TABLET | Freq: Three times a day (TID) | ORAL | 0 refills | Status: AC

## 2024-05-09 NOTE — Progress Notes (Signed)
 Subjective:    Patient ID: Allison Freeman, female    DOB: 03/13/1946, 78 y.o.   MRN: [id number]  Patient presents today complaining of abdominal discomfort.  She states that her belly has been hurting for 1 month.  She reports excessive bloating and excessive gas.  She is burping frequently.  She is farting and has a lot of gas.  She also reports bloating and feeling her abdomen is distended.  I initially gave her Linzess  290 mcg daily however this caused diarrhea.  She stopped the medication because of the diarrhea.  However now she is not going to the bathroom regularly.  She denies any melena or hematochezia.  She denies any nausea or vomiting.  She does have reflux. Past Medical History:  Diagnosis Date   Acid reflux    Arthritis    Depression    Family history of adverse reaction to anesthesia    Mother woke up and could hearduring surgery   Hypercholesterolemia    Hypertension    Insomnia    Osteoporosis    Panic attacks    panic attacks   Reflux    Restless leg syndrome    Past Surgical History:  Procedure Laterality Date   ABDOMINAL HYSTERECTOMY  2001   BLADDER SURGERY     BREAST BIOPSY  1987   BREAST CYST EXCISION  1998   CARPAL TUNNEL RELEASE Right 04/06/2015   Procedure: CARPAL TUNNEL RELEASE;  Surgeon: Taft FORBES Minerva, MD;  Location: [facility];  Service: Orthopedics;  Laterality: Right;   CARPAL TUNNEL RELEASE Left 06/30/2022   Procedure: LEFT CARPAL TUNNEL RELEASE;  Surgeon: Murrell Drivers, MD;  Location: [facility];  Service: Orthopedics;  Laterality: Left;   COLONOSCOPY  09/19/2004   MFM:Pwuzmwjo hemorrhoids, otherwise normal rectum/Sigmoid diverticula.  Remainder of colon mucosa appeared normal   COLONOSCOPY WITH ESOPHAGOGASTRODUODENOSCOPY (EGD) N/A 06/24/2013   MFM:Hjdumpr polyp-removed. Small hiatal hernia. Abnormal gastric mucosa-query NSAID effect-s/p (chronic inactive gastritis, negative H. pylori)/TCS: Prominent internal hemorrhoids-likely  source of hematochezia. Colonic diverticulosis. Colonic polyp-removed (tubular adenoma)   COLONOSCOPY WITH PROPOFOL  N/A 11/01/2018   Procedure: COLONOSCOPY WITH PROPOFOL ;  Surgeon: Shaaron Lamar HERO, MD;  Location: [facility];  Service: Endoscopy;  Laterality: N/A;  7:30am   CYSTOCELE REPAIR  2010   DILATION AND CURETTAGE OF UTERUS     JOINT REPLACEMENT N/A    Phreesia 03/02/2020   KNEE SURGERY Right 2003   arthroscopic left   RECTAL SURGERY     RECTOCELE REPAIR  2010   REPLACEMENT TOTAL KNEE Right    TONSILLECTOMY     TOTAL KNEE ARTHROPLASTY Right 05/09/2019   Procedure: TOTAL KNEE ARTHROPLASTY;  Surgeon: Rubie Kemps, MD;  Location: [facility];  Service: Orthopedics;  Laterality: Right;   Current Outpatient Medications on File Prior to Visit  Medication Sig Dispense Refill   acyclovir (ZOVIRAX) 400 MG tablet Take 400 mg by mouth 3 (three) times daily.     ALPRAZolam  (XANAX ) 0.5 MG tablet TAKE ONE TABLET (0.5MG  TOTAL) BY MOUTH AT BEDTIME 30 tablet 3   Calcium-Magnesium-Vitamin D  (CALCIUM 1200+D3 PO) Take 1 tablet by mouth daily.     Carboxymethylcellul-Glycerin (LUBRICATING EYE DROPS OP) Place 1 drop into both eyes 2 (two) times daily.     denosumab  (PROLIA ) 60 MG/ML SOSY injection Inject 60 mg into the skin every 6 (six) months. Last injection 09/19/22 1 mL 1   estradiol  (ESTRACE ) 0.1 MG/GM vaginal cream Discard plastic applicator. Insert a blueberry size amount (approximately  1 gram) of cream on fingertip inside vagina at bedtime every night for 1 week then every other night. For long term use. 30 g 3   famotidine  (PEPCID ) 40 MG tablet Take 1 tablet (40 mg total) by mouth daily. 90 tablet 0   gabapentin  (NEURONTIN ) 100 MG capsule TAKE ONE CAPSULE (100 MG TOTAL) BY MOUTH3 TIMES DAILY. 90 capsule 3   levocetirizine (XYZAL) 5 MG tablet Take 5 mg by mouth daily.     MELATONIN ER PO Take by mouth.     meloxicam  (MOBIC ) 7.5 MG tablet TAKE ONE (1) TABLET BY MOUTH EVERY DAY 30 tablet 0   Multiple  Vitamin (MULTIVITAMIN) tablet Take 1 tablet by mouth daily.     Omega-3 Fatty Acids (FISH OIL) 1200 MG CAPS Take 1,200 mg by mouth 2 (two) times daily.      pantoprazole  (PROTONIX ) 40 MG tablet TAKE ONE TABLET TWICE DAILY BEFORE MEALS 180 tablet 0   pravastatin  (PRAVACHOL ) 40 MG tablet TAKE ONE (1) TABLET BY MOUTH EVERY DAY 90 tablet 3   rOPINIRole  (REQUIP ) 2 MG tablet Take 1 tablet (2 mg total) by mouth at bedtime. 90 tablet 3   traMADol  (ULTRAM ) 50 MG tablet Take 1 tablet (50 mg total) by mouth every 6 (six) hours as needed. 90 tablet 2   Vibegron  (GEMTESA ) 75 MG TABS Take 1 tablet (75 mg total) by mouth daily. 30 tablet 11   cyclobenzaprine  (FLEXERIL ) 10 MG tablet TAKE ONE TABLET (10MG  TOTAL) BY MOUTH THREE TIMES DAILY AS NEEDED FOR MUSCLE SPASMS (Patient not taking: Reported on 05/09/2024) 90 tablet 3   fluticasone  (FLONASE ) 50 MCG/ACT nasal spray Use 2 sprays in each  nostril once daily (Patient not taking: Reported on 05/09/2024) 48 g 3   LINZESS  290 MCG CAPS capsule TAKE ONE CAPSULE ( TOTAL) BY MOUTHDAILY BEFORE BREAKFAST (Patient not taking: Reported on 05/09/2024) 30 capsule 3   No current facility-administered medications on file prior to visit.   No Known Allergies  Social History   Socioeconomic History   Marital status: Married    Spouse name: Not on file   Number of children: Not on file   Years of education: Not on file   Highest education level: Not on file  Occupational History   Not on file  Tobacco Use   Smoking status: Never   Smokeless tobacco: Never  Vaping Use   Vaping status: Never Used  Substance and Sexual Activity   Alcohol use: No   Drug use: No   Sexual activity: Yes    Birth control/protection: Surgical  Other Topics Concern   Not on file  Social History Narrative   Not on file   Social Drivers of Health   Financial Resource Strain: Low Risk  (03/05/2023)   Overall Financial Resource Strain (CARDIA)    Difficulty of Paying Living Expenses:  Not hard at all  Food Insecurity: No Food Insecurity (03/05/2023)   Hunger Vital Sign    Worried About Running Out of Food in the Last Year: Never true    Ran Out of Food in the Last Year: Never true  Transportation Needs: No Transportation Needs (03/05/2023)   PRAPARE - Administrator, Civil Service (Medical): No    Lack of Transportation (Non-Medical): No  Physical Activity: Insufficiently Active (03/05/2023)   Exercise Vital Sign    Days of Exercise per Week: 3 days    Minutes of Exercise per Session: 30 min  Stress: No Stress Concern Present (03/05/2023)  [employer] of Occupational Health - Occupational Stress Questionnaire    Feeling of Stress : Not at all  Social Connections: Socially Integrated (03/05/2023)   Social Connection and Isolation Panel    Frequency of Communication with Friends and Family: More than three times a week    Frequency of Social Gatherings with Friends and Family: Three times a week    Attends Religious Services: More than 4 times per year    Active Member of Clubs or Organizations: Yes    Attends Banker Meetings: 1 to 4 times per year    Marital Status: Married  Catering manager Violence: Not At Risk (03/05/2023)   Humiliation, Afraid, Rape, and Kick questionnaire    Fear of Current or Ex-Partner: No    Emotionally Abused: No    Physically Abused: No    Sexually Abused: No      Review of Systems  Gastrointestinal:  Positive for heartburn.  All other systems reviewed and are negative.      Objective:   Physical Exam Vitals reviewed.  Constitutional:      Appearance: She is well-developed.  HENT:     Right Ear: No middle ear effusion. Tympanic membrane is not erythematous.     Left Ear:  No middle ear effusion. Tympanic membrane is not erythematous.     Mouth/Throat:     Pharynx: No oropharyngeal exudate, posterior oropharyngeal erythema or uvula swelling.     Tonsils: No tonsillar abscesses.  Cardiovascular:      Rate and Rhythm: Normal rate and regular rhythm.     Heart sounds: Normal heart sounds. No murmur heard. Pulmonary:     Effort: Pulmonary effort is normal. No respiratory distress.     Breath sounds: Normal breath sounds. No wheezing or rales.  Abdominal:     General: Bowel sounds are normal. There is no distension.     Palpations: Abdomen is soft.     Tenderness: There is no abdominal tenderness. There is no guarding or rebound.  Musculoskeletal:     Cervical back: Neck supple.  Lymphadenopathy:     Cervical: No cervical adenopathy.           Assessment & Plan:  Generalized abdominal pain - Plan: CBC with Differential/Platelet, Comprehensive metabolic panel with GFR, Lipase  Bloating Large differential diagnosis.  I suspect IBS with constipation.  However small bowel bacterial overgrowth syndrome could also relate.  Begin Flagyl  500 mg 3 times daily for 10 days.  Meanwhile decrease dose of Linzess  to 74 mcg daily to try to help with constipation which may also help with bloating.  However her bloating seems to predate the constipation.  Obtain CBC, CMP, and lipase.  Colonoscopy is up-to-date.  If labs are unremarkable and symptoms or not improving, consider CT scan of the abdomen and pelvis next.

## 2024-05-10 ENCOUNTER — Ambulatory Visit: Payer: Self-pay | Admitting: Family Medicine

## 2024-05-10 LAB — COMPREHENSIVE METABOLIC PANEL WITH GFR
AG Ratio: 1.7 (calc) (ref 1.0–2.5)
ALT: 23 U/L (ref 6–29)
AST: 28 U/L (ref 10–35)
Albumin: 4.1 g/dL (ref 3.6–5.1)
Alkaline phosphatase (APISO): 38 U/L (ref 37–153)
BUN/Creatinine Ratio: 22 (calc) (ref 6–22)
BUN: 25 mg/dL (ref 7–25)
CO2: 27 mmol/L (ref 20–32)
Calcium: 9.2 mg/dL (ref 8.6–10.4)
Chloride: 109 mmol/L (ref 98–110)
Creat: 1.15 mg/dL — ABNORMAL HIGH (ref 0.60–1.00)
Globulin: 2.4 g/dL (ref 1.9–3.7)
Glucose, Bld: 104 mg/dL — ABNORMAL HIGH (ref 65–99)
Potassium: 4.6 mmol/L (ref 3.5–5.3)
Sodium: 143 mmol/L (ref 135–146)
Total Bilirubin: 1.1 mg/dL (ref 0.2–1.2)
Total Protein: 6.5 g/dL (ref 6.1–8.1)
eGFR: 50 mL/min/1.73m2 — ABNORMAL LOW (ref >=60)

## 2024-05-10 LAB — CBC WITH DIFFERENTIAL/PLATELET
Absolute Lymphocytes: 1762 {cells}/uL (ref 850–3900)
Absolute Monocytes: 515 {cells}/uL (ref 200–950)
Basophils Absolute: 53 {cells}/uL (ref 0–200)
Basophils Relative: 0.8 %
Eosinophils Absolute: 79 {cells}/uL (ref 15–500)
Eosinophils Relative: 1.2 %
HCT: 39.9 % (ref 35.0–45.0)
Hemoglobin: 13 g/dL (ref 11.7–15.5)
MCH: 30.8 pg (ref 27.0–33.0)
MCHC: 32.6 g/dL (ref 32.0–36.0)
MCV: 94.5 fL (ref 80.0–100.0)
MPV: 10.6 fL (ref 7.5–12.5)
Monocytes Relative: 7.8 %
Neutro Abs: 4191 {cells}/uL (ref 1500–7800)
Neutrophils Relative %: 63.5 %
Platelets: 252 Thousand/uL (ref 140–400)
RBC: 4.22 Million/uL (ref 3.80–5.10)
RDW: 13.3 % (ref 11.0–15.0)
Total Lymphocyte: 26.7 %
WBC: 6.6 Thousand/uL (ref 3.8–10.8)

## 2024-05-10 LAB — LIPASE: Lipase: 20 U/L (ref 7–60)

## 2024-05-11 ENCOUNTER — Other Ambulatory Visit: Payer: Self-pay | Admitting: Family Medicine

## 2024-05-12 ENCOUNTER — Encounter: Payer: Self-pay | Admitting: Family Medicine

## 2024-05-12 ENCOUNTER — Other Ambulatory Visit: Payer: Self-pay

## 2024-05-12 DIAGNOSIS — R1084 Generalized abdominal pain: Secondary | ICD-10-CM

## 2024-05-12 DIAGNOSIS — K5900 Constipation, unspecified: Secondary | ICD-10-CM

## 2024-05-12 DIAGNOSIS — R140 Abdominal distension (gaseous): Secondary | ICD-10-CM

## 2024-05-12 MED ORDER — LINACLOTIDE 72 MCG PO CAPS
72.0000 ug | ORAL_CAPSULE | Freq: Every day | ORAL | 3 refills | Status: AC
Start: 2024-05-12 — End: ?

## 2024-05-12 MED ORDER — PANTOPRAZOLE SODIUM 40 MG PO TBEC: 40.0000 mg | DELAYED_RELEASE_TABLET | Freq: Every day | ORAL | 1 refills | Status: DC

## 2024-05-12 NOTE — Telephone Encounter (Signed)
 Requested Prescriptions  Pending Prescriptions Disp Refills   pantoprazole  (PROTONIX ) 40 MG tablet [Pharmacy Med Name: PANTOPRAZOLE  SODIUM 40 MG DR TAB] 180 tablet 0    Sig: TAKE ONE TABLET TWICE DAILY BEFORE MEALS     Gastroenterology: Proton Pump Inhibitors Passed - 05/12/2024  2:06 PM      Passed - Valid encounter within last 12 months    Recent Outpatient Visits           3 days ago Generalized abdominal pain   [place] [facility] Pickard, Butler DASEN, MD   1 month ago Pure hypercholesterolemia   [place] [facility] Duanne, Butler DASEN, MD   2 months ago Dysuria   [place] [facility] Kayla Jeoffrey RAMAN, FNP   7 months ago Osteoporosis, unspecified osteoporosis type, unspecified pathological fracture presence   [place] [facility] Duanne Butler DASEN, MD   8 months ago Rotator cuff arthropathy of left shoulder   [place] [facility] Kayla Jeoffrey RAMAN, FNP       Future Appointments             In 1 month Gerldine, Lauraine BROCKS, FNP [facility] [place]

## 2024-05-16 ENCOUNTER — Encounter (HOSPITAL_COMMUNITY): Payer: Self-pay

## 2024-05-16 ENCOUNTER — Ambulatory Visit (HOSPITAL_COMMUNITY)
Admission: RE | Admit: 2024-05-16 | Discharge: 2024-05-16 | Disposition: A | Discharge status: Home / Self Care | Source: Ambulatory Visit | Attending: Family Medicine | Admitting: Family Medicine

## 2024-05-16 DIAGNOSIS — Z1231 Encounter for screening mammogram for malignant neoplasm of breast: Secondary | ICD-10-CM | POA: Diagnosis not present

## 2024-05-24 ENCOUNTER — Other Ambulatory Visit: Payer: Self-pay | Admitting: Family Medicine

## 2024-05-24 NOTE — Telephone Encounter (Signed)
 Requested Prescriptions  Pending Prescriptions Disp Refills   rOPINIRole  (REQUIP ) 2 MG tablet [Pharmacy Med Name: ROPINIROLE  HCL 2 MG TAB] 90 tablet 2    Sig: TAKE ONE TABLET (2MG  TOTAL) BY MOUTH AT BEDTIME     Neurology:  Parkinsonian Agents Passed - 05/24/2024  5:48 PM      Passed - Last BP in normal range    BP Readings from Last 1 Encounters:  05/09/24 136/82         Passed - Last Heart Rate in normal range    Pulse Readings from Last 1 Encounters:  05/09/24 76         Passed - Valid encounter within last 12 months    Recent Outpatient Visits           2 weeks ago Generalized abdominal pain   [place] [facility] Pickard, Butler DASEN, MD   1 month ago Pure hypercholesterolemia   [place] [facility] Duanne, Butler DASEN, MD   3 months ago Dysuria   [place] [facility] Kayla Jeoffrey RAMAN, FNP   7 months ago Osteoporosis, unspecified osteoporosis type, unspecified pathological fracture presence   [place] [facility] Duanne Butler DASEN, MD   8 months ago Rotator cuff arthropathy of left shoulder   [place] [facility] Kayla Jeoffrey RAMAN, [place]

## 2024-05-31 ENCOUNTER — Other Ambulatory Visit: Payer: Self-pay | Admitting: Family Medicine

## 2024-05-31 MED ORDER — ALPRAZOLAM 0.5 MG PO TABS: ORAL_TABLET | ORAL | 3 refills | Status: DC

## 2024-06-08 ENCOUNTER — Ambulatory Visit

## 2024-06-08 VITALS — Ht 64.0 in | Wt 174.0 lb

## 2024-06-08 DIAGNOSIS — Z0000 Encounter for general adult medical examination without abnormal findings: Secondary | ICD-10-CM

## 2024-06-08 NOTE — Patient Instructions (Signed)
 Allison Freeman,  Thank you for taking the time for your Medicare Wellness Visit. I appreciate your continued commitment to your health goals. Please review the care plan we discussed, and feel free to reach out if I can assist you further.  Medicare recommends these wellness visits once per year to help you and your care team stay ahead of potential health issues. These visits are designed to focus on prevention, allowing your provider to concentrate on managing your acute and chronic conditions during your regular appointments.  Please note that Annual Wellness Visits do not include a physical exam. Some assessments may be limited, especially if the visit was conducted virtually. If needed, we may recommend a separate in-person follow-up with your provider.  Ongoing Care Seeing your primary care provider every 3 to 6 months helps us  monitor your health and provide consistent, personalized care.   Referrals If a referral was made during today's visit and you haven't received any updates within two weeks, please contact the referred provider directly to check on the status.  Recommended Screenings:  Health Maintenance  Topic Date Due   Flu Shot  04/22/2024   COVID-19 Vaccine (3 - 2025-26 season) 05/23/2024   DTaP/Tdap/Td vaccine (1 - Tdap) 10/12/2024*   Medicare Annual Wellness Visit  06/08/2025   Colon Cancer Screening  11/01/2028   Pneumococcal Vaccine for age over 56  Completed   DEXA scan (bone density measurement)  Completed   Hepatitis C Screening  Completed   Zoster (Shingles) Vaccine  Completed   HPV Vaccine  Aged Out   Meningitis B Vaccine  Aged Out   Breast Cancer Screening  Discontinued  *Topic was postponed. The date shown is not the original due date.       06/08/2024   11:57 AM  Advanced Directives  Does Patient Have a Medical Advance Directive? No  Would patient like information on creating a medical advance directive? Yes (MAU/Ambulatory/Procedural Areas -  Information given)   Advance Care Planning is important because it: Ensures you receive medical care that aligns with your values, goals, and preferences. Provides guidance to your family and loved ones, reducing the emotional burden of decision-making during critical moments.  Information on Advanced Care Planning can be found at [place]  Secretary of [facility] Advance Health Care Directives Advance Health Care Directives ([email or web address]   Vision: Annual vision screenings are recommended for early detection of glaucoma, cataracts, and diabetic retinopathy. These exams can also reveal signs of chronic conditions such as diabetes and high blood pressure.  Dental: Annual dental screenings help detect early signs of oral cancer, gum disease, and other conditions linked to overall health, including heart disease and diabetes.  Please see the attached documents for additional preventive care recommendations.

## 2024-06-08 NOTE — Progress Notes (Signed)
 Subjective:   Allison Freeman is a 78 y.o. who presents for a Medicare Wellness preventive visit.  As a reminder, Annual Wellness Visits don't include a physical exam, and some assessments may be limited, especially if this visit is performed virtually. We may recommend an in-person follow-up visit with your provider if needed.  Visit Complete: Virtual I connected with  Allison Freeman on 06/08/24 by a audio enabled telemedicine application and verified that I am speaking with the correct person using two identifiers.  Patient Location: Home  Provider Location: Home Office  I discussed the limitations of evaluation and management by telemedicine. The patient expressed understanding and agreed to proceed.  Vital Signs: Because this visit was a virtual/telehealth visit, some criteria may be missing or patient reported. Any vitals not documented were not able to be obtained and vitals that have been documented are patient reported.  VideoDeclined- This patient declined Librarian, academic. Therefore the visit was completed with audio only.  Persons Participating in Visit: Patient.  AWV Questionnaire: Yes: Patient Medicare AWV questionnaire was completed by the patient on 06/01/24; I have confirmed that all information answered by patient is correct and no changes since this date.  Cardiac Risk Factors include: advanced age (>13men, >52 women);dyslipidemia     Objective:    Today's Vitals   06/08/24 1153  Weight: 174 lb (78.9 kg)  Height: 5' 4 (1.626 m)   Body mass index is 29.87 kg/m.     06/08/2024   11:57 AM 03/05/2023    4:20 PM 06/23/2022    9:09 AM 03/07/2021    8:33 AM 05/09/2019   11:20 AM 05/09/2019    6:25 AM 05/05/2019    3:35 PM  Advanced Directives  Does Patient Have a Medical Advance Directive? No No No Yes Yes Yes Yes  Type of Theme park manager;Living will Healthcare Power of [place];Living will  Healthcare Power of [place];Living will Healthcare Power of [place];Living will  Does patient want to make changes to medical advance directive?    No - Patient declined No - Patient declined   No - Patient declined   Copy of Healthcare Power of Attorney in Chart?    No - copy requested No - copy requested  No - copy requested  No - copy requested   Would patient like information on creating a medical advance directive? Yes (MAU/Ambulatory/Procedural Areas - Information given) Yes (MAU/Ambulatory/Procedural Areas - Information given) No - Patient declined         Data saved with a previous flowsheet row definition    Current Medications (verified) Outpatient Encounter Medications as of 06/08/2024  Medication Sig   acyclovir (ZOVIRAX) 400 MG tablet Take 400 mg by mouth 3 (three) times daily.   ALPRAZolam  (XANAX ) 0.5 MG tablet TAKE ONE TABLET (0.5MG  TOTAL) BY MOUTH AT BEDTIME   Calcium-Magnesium-Vitamin D  (CALCIUM 1200+D3 PO) Take 1 tablet by mouth daily.   Carboxymethylcellul-Glycerin (LUBRICATING EYE DROPS OP) Place 1 drop into both eyes 2 (two) times daily.   denosumab  (PROLIA ) 60 MG/ML SOSY injection Inject 60 mg into the skin every 6 (six) months. Last injection 09/19/22   estradiol  (ESTRACE ) 0.1 MG/GM vaginal cream Discard plastic applicator. Insert a blueberry size amount (approximately 1 gram) of cream on fingertip inside vagina at bedtime every night for 1 week then every other night. For long term use.   famotidine  (PEPCID ) 40 MG tablet Take 1 tablet (40 mg total) by mouth  daily.   gabapentin  (NEURONTIN ) 100 MG capsule TAKE ONE CAPSULE (100 MG TOTAL) BY MOUTH3 TIMES DAILY.   levocetirizine (XYZAL) 5 MG tablet Take 5 mg by mouth daily.   linaclotide  (LINZESS ) 72 MCG capsule Take 1 capsule (72 mcg total) by mouth daily before breakfast.   MELATONIN ER PO Take by mouth.   meloxicam  (MOBIC ) 7.5 MG tablet TAKE ONE (1) TABLET BY MOUTH EVERY DAY   Multiple Vitamin (MULTIVITAMIN) tablet Take  1 tablet by mouth daily.   Omega-3 Fatty Acids (FISH OIL) 1200 MG CAPS Take 1,200 mg by mouth 2 (two) times daily.    pantoprazole  (PROTONIX ) 40 MG tablet Take 1 tablet (40 mg total) by mouth daily.   pravastatin  (PRAVACHOL ) 40 MG tablet TAKE ONE (1) TABLET BY MOUTH EVERY DAY   rOPINIRole  (REQUIP ) 2 MG tablet TAKE ONE TABLET (2MG  TOTAL) BY MOUTH AT BEDTIME   traMADol  (ULTRAM ) 50 MG tablet Take 1 tablet (50 mg total) by mouth every 6 (six) hours as needed.   Vibegron  (GEMTESA ) 75 MG TABS Take 1 tablet (75 mg total) by mouth daily.   cyclobenzaprine  (FLEXERIL ) 10 MG tablet TAKE ONE TABLET (10MG  TOTAL) BY MOUTH THREE TIMES DAILY AS NEEDED FOR MUSCLE SPASMS (Patient not taking: Reported on 06/08/2024)   fluticasone  (FLONASE ) 50 MCG/ACT nasal spray Use 2 sprays in each  nostril once daily (Patient not taking: Reported on 06/08/2024)   No facility-administered encounter medications on file as of 06/08/2024.    Allergies (verified) Patient has no known allergies.   History: Past Medical History:  Diagnosis Date   Acid reflux    Arthritis    Depression    Family history of adverse reaction to anesthesia    Mother woke up and could hearduring surgery   Hypercholesterolemia    Hypertension    Insomnia    Osteoporosis    Panic attacks    panic attacks   Reflux    Restless leg syndrome    Past Surgical History:  Procedure Laterality Date   ABDOMINAL HYSTERECTOMY  2001   BLADDER SURGERY     BREAST BIOPSY  1987   BREAST CYST EXCISION  1998   CARPAL TUNNEL RELEASE Right 04/06/2015   Procedure: CARPAL TUNNEL RELEASE;  Surgeon: Taft FORBES Minerva, MD;  Location: [facility];  Service: Orthopedics;  Laterality: Right;   CARPAL TUNNEL RELEASE Left 06/30/2022   Procedure: LEFT CARPAL TUNNEL RELEASE;  Surgeon: Murrell Drivers, MD;  Location: [facility];  Service: Orthopedics;  Laterality: Left;   COLONOSCOPY  09/19/2004   MFM:Pwuzmwjo hemorrhoids, otherwise normal rectum/Sigmoid diverticula.   Remainder of colon mucosa appeared normal   COLONOSCOPY WITH ESOPHAGOGASTRODUODENOSCOPY (EGD) N/A 06/24/2013   MFM:Hjdumpr polyp-removed. Small hiatal hernia. Abnormal gastric mucosa-query NSAID effect-s/p (chronic inactive gastritis, negative H. pylori)/TCS: Prominent internal hemorrhoids-likely source of hematochezia. Colonic diverticulosis. Colonic polyp-removed (tubular adenoma)   COLONOSCOPY WITH PROPOFOL  N/A 11/01/2018   Procedure: COLONOSCOPY WITH PROPOFOL ;  Surgeon: Shaaron Lamar HERO, MD;  Location: [facility];  Service: Endoscopy;  Laterality: N/A;  7:30am   CYSTOCELE REPAIR  2010   DILATION AND CURETTAGE OF UTERUS     JOINT REPLACEMENT N/A    [employer] 03/02/2020   KNEE SURGERY Right 2003   arthroscopic left   RECTAL SURGERY     RECTOCELE REPAIR  2010   REPLACEMENT TOTAL KNEE Right    TONSILLECTOMY     TOTAL KNEE ARTHROPLASTY Right 05/09/2019   Procedure: TOTAL KNEE ARTHROPLASTY;  Surgeon: Rubie Kemps, MD;  Location: [facility];  Service: Orthopedics;  Laterality: Right;   Family History  Problem Relation Age of Onset   Breast cancer Mother    Hypertension Mother    Cancer Mother        Breast   Hyperlipidemia Mother    Osteoporosis Mother    Hyperlipidemia Father    Hypertension Father    Heart attack Father    Breast cancer Sister    Cancer Sister        Breast   Breast cancer Sister    Cancer Sister        breast   Breast cancer Sister    Colon cancer Neg Hx    Social History   Socioeconomic History   Marital status: Married    Spouse name: Not on file   Number of children: Not on file   Years of education: Not on file   Highest education level: 12th grade  Occupational History   Not on file  Tobacco Use   Smoking status: Never   Smokeless tobacco: Never  Vaping Use   Vaping status: Never Used  Substance and Sexual Activity   Alcohol use: No   Drug use: No   Sexual activity: Yes    Birth control/protection: Surgical  Other Topics Concern   Not on  file  Social History Narrative   Not on file   Social Drivers of Health   Financial Resource Strain: Low Risk  (06/01/2024)   Overall Financial Resource Strain (CARDIA)    Difficulty of Paying Living Expenses: Not hard at all  Food Insecurity: No Food Insecurity (06/01/2024)   Hunger Vital Sign    Worried About Running Out of Food in the Last Year: Never true    Ran Out of Food in the Last Year: Never true  Transportation Needs: No Transportation Needs (06/01/2024)   PRAPARE - Administrator, Civil Service (Medical): No    Lack of Transportation (Non-Medical): No  Physical Activity: Insufficiently Active (06/01/2024)   Exercise Vital Sign    Days of Exercise per Week: 1 day    Minutes of Exercise per Session: 20 min  Stress: No Stress Concern Present (06/01/2024)   [employer] of Occupational Health - Occupational Stress Questionnaire    Feeling of Stress: Only a little  Social Connections: Moderately Integrated (06/01/2024)   Social Connection and Isolation Panel    Frequency of Communication with Friends and Family: Twice a week    Frequency of Social Gatherings with Friends and Family: Three times a week    Attends Religious Services: More than 4 times per year    Active Member of Clubs or Organizations: No    Attends Banker Meetings: Never    Marital Status: Married    Tobacco Counseling Counseling given: Not Answered    Clinical Intake:  Pre-visit preparation completed: Yes  Pain : No/denies pain  Diabetes: No  How often do you need to have someone help you when you read instructions, pamphlets, or other written materials from your doctor or pharmacy?: 1 - Never  Interpreter Needed?: No  Information entered by :: Charmaine Bloodgood LPN   Activities of Daily Living     06/08/2024   11:56 AM  In your present state of health, do you have any difficulty performing the following activities:  Hearing? 0  Vision? 0  Difficulty  concentrating or making decisions? 0  Walking or climbing stairs? 0  Dressing or bathing? 0  Doing errands,  shopping? 0  Preparing Food and eating ? N  Using the Toilet? N  In the past six months, have you accidently leaked urine? N  Do you have problems with loss of bowel control? N  Managing your Medications? N  Managing your Finances? N  Housekeeping or managing your Housekeeping? N    Patient Care Team: Duanne Butler DASEN, MD as PCP - General (Family Medicine) Watt Rush, MD as Attending Physician (Urology) Lenis Ethelle ORN, MD as Consulting Physician (Endocrinology) Karis Clunes, MD as Consulting Physician (Otolaryngology) Darroll Anes, DO (Optometry) Tobie Franky SQUIBB, DPM as Consulting Physician (Podiatry)  I have updated your Care Teams any recent Medical Services you may have received from other providers in the past year.     Assessment:   This is a routine wellness examination for Allison Freeman.  Hearing/Vision screen Hearing Screening - Comments:: Denies hearing difficulties   Vision Screening - Comments:: Wears rx glasses - up to date with routine eye exams with Dr. Darroll    Goals Addressed             This Visit's Progress    Remain active and independent   On track      Depression Screen     06/08/2024   11:55 AM 02/22/2024    9:15 AM 03/23/2023    8:29 AM 03/05/2023    4:19 PM 03/17/2022    8:39 AM 03/07/2021    8:32 AM 03/05/2020    8:33 AM  PHQ 2/9 Scores  PHQ - 2 Score 2 2 0 0 0 0 0  PHQ- 9 Score 2 2 4   0  0    Fall Risk     06/08/2024   11:56 AM 02/22/2024    9:15 AM 02/22/2024    8:40 AM 03/23/2023    8:29 AM 03/05/2023    4:20 PM  Fall Risk   Falls in the past year? 0 0 0 0 0  Number falls in past yr: 0 0 0 0 0  Injury with Fall? 0 0 0 0 0  Risk for fall due to : No Fall Risks No Fall Risks No Fall Risks  No Fall Risks  Follow up Falls prevention discussed;Education provided;Falls evaluation completed Falls evaluation completed Falls evaluation  completed  Falls prevention discussed;Education provided;Falls evaluation completed    MEDICARE RISK AT HOME:  Medicare Risk at Home Any stairs in or around the home?: No If so, are there any without handrails?: No Home free of loose throw rugs in walkways, pet beds, electrical cords, etc?: Yes Adequate lighting in your home to reduce risk of falls?: Yes Life alert?: No Use of a cane, walker or w/c?: No Grab bars in the bathroom?: Yes Shower chair or bench in shower?: No Elevated toilet seat or a handicapped toilet?: Yes  TIMED UP AND GO:  Was the test performed?  No  Cognitive Function: 6CIT completed        06/08/2024   11:57 AM 03/05/2023    4:20 PM  6CIT Screen  What Year? 0 points 0 points  What month? 0 points 0 points  What time? 0 points 0 points  Count back from 20 0 points 0 points  Months in reverse 0 points 0 points  Repeat phrase 0 points 0 points  Total Score 0 points 0 points    Immunizations Immunization History  Administered Date(s) Administered   Fluad Quad(high Dose 65+) 08/07/2020, 06/11/2021   Fluad Trivalent(High Dose 65+) 09/07/2023   Influenza,inj,Quad  PF,6+ Mos 06/23/2014, 06/27/2015, 06/27/2016   Influenza-Unspecified 06/30/2013, 07/14/2017, 07/06/2018   PFIZER(Purple Top)SARS-COV-2 Vaccination 10/13/2019, 11/03/2019   Pneumococcal Conjugate-13 06/23/2014   Pneumococcal Polysaccharide-23 01/28/2016   Zoster Recombinant(Shingrix) 11/27/2017, 03/01/2018    Screening Tests Health Maintenance  Topic Date Due   Influenza Vaccine  04/22/2024   COVID-19 Vaccine (3 - 2025-26 season) 05/23/2024   DTaP/Tdap/Td (1 - Tdap) 10/12/2024 (Originally 06/23/1965)   Medicare Annual Wellness (AWV)  06/08/2025   Colonoscopy  11/01/2028   Pneumococcal Vaccine: 50+ Years  Completed   DEXA SCAN  Completed   Hepatitis C Screening  Completed   Zoster Vaccines- Shingrix  Completed   HPV VACCINES  Aged Out   Meningococcal B Vaccine  Aged Out   Mammogram   Discontinued    Health Maintenance Items Addressed: Information provided on vaccine recommendations   Additional Screening:  Vision Screening: Recommended annual ophthalmology exams for early detection of glaucoma and other disorders of the eye. Is the patient up to date with their annual eye exam?  Yes  Who is the provider or what is the name of the office in which the patient attends annual eye exams? Dr. Darroll  Dental Screening: Recommended annual dental exams for proper oral hygiene  Community Resource Referral / Chronic Care Management: CRR required this visit?  No   CCM required this visit?  No   Plan:    I have personally reviewed and noted the following in the patient's chart:   Medical and social history Use of alcohol, tobacco or illicit drugs  Current medications and supplements including opioid prescriptions. Patient is not currently taking opioid prescriptions. Functional ability and status Nutritional status Physical activity Advanced directives List of other physicians Hospitalizations, surgeries, and ER visits in previous 12 months Vitals Screenings to include cognitive, depression, and falls Referrals and appointments  In addition, I have reviewed and discussed with patient certain preventive protocols, quality metrics, and best practice recommendations. A written personalized care plan for preventive services as well as general preventive health recommendations were provided to patient.   Lavelle Pfeiffer [place], [place]   [id number]   After Visit Summary: (MyChart) Due to this being a telephonic visit, the after visit summary with patients personalized plan was offered to patient via MyChart   Notes: Nothing significant to report at this time.

## 2024-06-14 ENCOUNTER — Other Ambulatory Visit: Payer: Self-pay | Admitting: Family Medicine

## 2024-06-15 ENCOUNTER — Ambulatory Visit: Admitting: Podiatry

## 2024-06-15 DIAGNOSIS — M19072 Primary osteoarthritis, left ankle and foot: Secondary | ICD-10-CM

## 2024-06-15 DIAGNOSIS — M19071 Primary osteoarthritis, right ankle and foot: Secondary | ICD-10-CM

## 2024-06-15 NOTE — Progress Notes (Signed)
 Subjective:  Patient ID: Allison Freeman, female    DOB: 1946/09/16,  MRN: [id number]  Chief Complaint  Patient presents with   Arthritis    78 y.o. female presents with the above complaint.  Patient presents with bilateral top midfoot arthritis that has been present for quite some time is progressive gotten worse worse with ambulation is with pressure she wants to discuss treatment options for this.  Pain scale 5 out of 10 dull aching nature hurts with getting out of bed forcing the morning dull aching nature   Review of Systems: Negative except as noted in the HPI. Denies N/V/F/Ch.  Past Medical History:  Diagnosis Date   Acid reflux    Arthritis    Depression    Family history of adverse reaction to anesthesia    Mother woke up and could hearduring surgery   Hypercholesterolemia    Hypertension    Insomnia    Osteoporosis    Panic attacks    panic attacks   Reflux    Restless leg syndrome     Current Outpatient Medications:    acyclovir (ZOVIRAX) 400 MG tablet, Take 400 mg by mouth 3 (three) times daily., Disp: , Rfl:    ALPRAZolam  (XANAX ) 0.5 MG tablet, TAKE ONE TABLET (0.5MG  TOTAL) BY MOUTH AT BEDTIME, Disp: 30 tablet, Rfl: 3   Calcium-Magnesium-Vitamin D  (CALCIUM 1200+D3 PO), Take 1 tablet by mouth daily., Disp: , Rfl:    Carboxymethylcellul-Glycerin (LUBRICATING EYE DROPS OP), Place 1 drop into both eyes 2 (two) times daily., Disp: , Rfl:    cyclobenzaprine  (FLEXERIL ) 10 MG tablet, TAKE ONE TABLET (10MG  TOTAL) BY MOUTH THREE TIMES DAILY AS NEEDED FOR MUSCLE SPASMS (Patient not taking: Reported on 06/08/2024), Disp: 90 tablet, Rfl: 3   denosumab  (PROLIA ) 60 MG/ML SOSY injection, Inject 60 mg into the skin every 6 (six) months. Last injection 09/19/22, Disp: 1 mL, Rfl: 1   estradiol  (ESTRACE ) 0.1 MG/GM vaginal cream, Discard plastic applicator. Insert a blueberry size amount (approximately 1 gram) of cream on fingertip inside vagina at bedtime every night for 1 week then  every other night. For long term use., Disp: 30 g, Rfl: 3   famotidine  (PEPCID ) 40 MG tablet, TAKE ONE TABLET (40MG  TOTAL) BY MOUTH DAILY, Disp: 90 tablet, Rfl: 0   fluticasone  (FLONASE ) 50 MCG/ACT nasal spray, Use 2 sprays in each  nostril once daily (Patient not taking: Reported on 06/08/2024), Disp: 48 g, Rfl: 3   gabapentin  (NEURONTIN ) 100 MG capsule, TAKE ONE CAPSULE (100 MG TOTAL) BY MOUTH3 TIMES DAILY., Disp: 90 capsule, Rfl: 3   levocetirizine (XYZAL) 5 MG tablet, Take 5 mg by mouth daily., Disp: , Rfl:    linaclotide  (LINZESS ) 72 MCG capsule, Take 1 capsule (72 mcg total) by mouth daily before breakfast., Disp: 30 capsule, Rfl: 3   MELATONIN ER PO, Take by mouth., Disp: , Rfl:    meloxicam  (MOBIC ) 7.5 MG tablet, TAKE ONE (1) TABLET BY MOUTH EVERY DAY, Disp: 30 tablet, Rfl: 0   Multiple Vitamin (MULTIVITAMIN) tablet, Take 1 tablet by mouth daily., Disp: , Rfl:    Omega-3 Fatty Acids (FISH OIL) 1200 MG CAPS, Take 1,200 mg by mouth 2 (two) times daily. , Disp: , Rfl:    pantoprazole  (PROTONIX ) 40 MG tablet, Take 1 tablet (40 mg total) by mouth daily., Disp: 90 tablet, Rfl: 1   pravastatin  (PRAVACHOL ) 40 MG tablet, TAKE ONE (1) TABLET BY MOUTH EVERY DAY, Disp: 90 tablet, Rfl: 3   rOPINIRole  (REQUIP ) 2  MG tablet, TAKE ONE TABLET (2MG  TOTAL) BY MOUTH AT BEDTIME, Disp: 90 tablet, Rfl: 2   traMADol  (ULTRAM ) 50 MG tablet, Take 1 tablet (50 mg total) by mouth every 6 (six) hours as needed., Disp: 90 tablet, Rfl: 2   Vibegron  (GEMTESA ) 75 MG TABS, Take 1 tablet (75 mg total) by mouth daily., Disp: 30 tablet, Rfl: 11  Social History   Tobacco Use  Smoking Status Never  Smokeless Tobacco Never    No Known Allergies Objective:  There were no vitals filed for this visit. There is no height or weight on file to calculate BMI. Constitutional Well developed. Well nourished.  Vascular Dorsalis pedis pulses palpable bilaterally. Posterior tibial pulses palpable bilaterally. Capillary refill normal  to all digits.  No cyanosis or clubbing noted. Pedal hair growth normal.  Neurologic Normal speech. Oriented to person, place, and time. Epicritic sensation to light touch grossly present bilaterally.  Dermatologic Nails well groomed and normal in appearance. No open wounds. No skin lesions.  Orthopedic: Pain on palpation bilateral dorsal midfoot.  Clinically able to appreciate the underlying arch changes.  Negative extensor tendinitis noted.  Lisfranc interval intact.   Radiographs: None Assessment:   No diagnosis found.  Plan:  Patient was evaluated and treated and all questions answered.  Bilateral midfoot arthritis - All questions and concerns were discussed with the patient in extensive detail given the amount of pain that she is having should benefit from steroid injection of decreasing inflammatory component surgical pain.  Patient agrees with plan was to proceed with steroid injection. -A second steroid injection was performed at bilateral midfoot using 1% plain Lidocaine  and 10 mg of Kenalog . This was well tolerated.   No follow-ups on file.

## 2024-06-22 ENCOUNTER — Ambulatory Visit: Admitting: "Endocrinology

## 2024-06-27 ENCOUNTER — Other Ambulatory Visit: Payer: Self-pay | Admitting: Family Medicine

## 2024-06-27 ENCOUNTER — Ambulatory Visit

## 2024-06-29 ENCOUNTER — Ambulatory Visit

## 2024-06-29 DIAGNOSIS — N3281 Overactive bladder: Secondary | ICD-10-CM | POA: Diagnosis not present

## 2024-06-29 NOTE — Patient Instructions (Signed)

## 2024-06-29 NOTE — Progress Notes (Cosign Needed Addendum)
 PTNS  Session # 1 of 12  PTNS was prescribed for the patient's Overactive Bladder symptoms. The voiding diary and/or patients symptoms were reviewed prior to the start of treatment.   Patient Goals: Reduced Urgency  Health & Social Factors:  No change Caffeine: coffee 2 /day Alcohol: no Daytime voids #per day: 8 Night-time voids #per night: 1-2 Urgency: yes Mild Incontinence Episodes #per day: 1-2 a week Treatment Plan/Comments: Reduced Caffeine  Ankle used: left Treatment Setting: 9 Feeling/ Response: positive sensation  PTNS Treatment The needle electrode was inserted into the lower, inner aspect of patient's leg. The surface electrode was placed on the inside arch of the foot on the treatment leg.The lead set was connected to the stimulator, and the needle electrode clip was connected to the needle electrode. The stimulator that produces an adjustable electrical pulse that travels to the sacral nerve plexus via the tibial nerve was increased until a patient response was observed.    Performed By: Carlos, CMA  Follow Up: Keep weekly PTNS

## 2024-07-05 ENCOUNTER — Other Ambulatory Visit: Payer: Self-pay | Admitting: Family Medicine

## 2024-07-06 ENCOUNTER — Ambulatory Visit

## 2024-07-06 DIAGNOSIS — N3281 Overactive bladder: Secondary | ICD-10-CM | POA: Diagnosis not present

## 2024-07-06 NOTE — Patient Instructions (Signed)

## 2024-07-06 NOTE — Progress Notes (Signed)
 PTNS  Session # 2 of 12  PTNS was prescribed for the patient's Overactive Bladder symptoms. The voiding diary and/or patients symptoms were reviewed prior to the start of treatment.   Patient Goals: Reduced Urgency  Health & Social Factors:  Caffeine: 3 Alcohol: no Daytime voids #per day: 9 Night-time voids #per night: 1-2 Urgency: yes Mild Incontinence Episodes #per day: sometimes 2-3 a week Treatment Plan/Comments: Reduced Caffeine  Ankle used: left Treatment Setting: 10 Feeling/ Response: positive sensation  PTNS Treatment The needle electrode was inserted into the lower, inner aspect of patient's leg. The surface electrode was placed on the inside arch of the foot on the treatment leg.The lead set was connected to the stimulator, and the needle electrode clip was connected to the needle electrode. The stimulator that produces an adjustable electrical pulse that travels to the sacral nerve plexus via the tibial nerve was increased until a patient response was observed.    Performed By: Carlos, CMA  Follow Up: Keep weekly  PTNS

## 2024-07-07 ENCOUNTER — Ambulatory Visit: Admitting: Urology

## 2024-07-12 ENCOUNTER — Encounter: Payer: Self-pay | Admitting: Family Medicine

## 2024-07-12 ENCOUNTER — Ambulatory Visit: Admitting: Family Medicine

## 2024-07-12 VITALS — BP 136/74 | HR 89 | Temp 98.8°F | Ht 64.0 in | Wt 168.2 lb

## 2024-07-12 DIAGNOSIS — Z23 Encounter for immunization: Secondary | ICD-10-CM

## 2024-07-12 DIAGNOSIS — M79671 Pain in right foot: Secondary | ICD-10-CM

## 2024-07-12 DIAGNOSIS — R1032 Left lower quadrant pain: Secondary | ICD-10-CM

## 2024-07-12 MED ORDER — DICYCLOMINE HCL 20 MG PO TABS: 20.0000 mg | ORAL_TABLET | Freq: Four times a day (QID) | ORAL | 0 refills | Status: DC | PRN

## 2024-07-12 NOTE — Progress Notes (Signed)
 Subjective:    Patient ID: Allison Freeman, female    DOB: [birth year], 78 y.o.   MRN: [id number] 05/09/24 Patient presents today complaining of abdominal discomfort.  She states that her belly has been hurting for 1 month.  She reports excessive bloating and excessive gas.  She is burping frequently.  She is farting and has a lot of gas.  She also reports bloating and feeling her abdomen is distended.  I initially gave her Linzess  290 mcg daily however this caused diarrhea.  She stopped the medication because of the diarrhea.  However now she is not going to the bathroom regularly.  She denies any melena or hematochezia.  She denies any nausea or vomiting.  She does have reflux.  At that time, my plan was:  Large differential diagnosis.  I suspect IBS with constipation.  However small bowel bacterial overgrowth syndrome could also relate.  Begin Flagyl  500 mg 3 times daily for 10 days.  Meanwhile decrease dose of Linzess  to 74 mcg daily to try to help with constipation which may also help with bloating.  However her bloating seems to predate the constipation.  Obtain CBC, CMP, and lipase.  Colonoscopy is up-to-date.  If labs are unremarkable and symptoms or not improving, consider CT scan of the abdomen and pelvis next.  07/12/24 Patient continues to have abdominal pain.  The abdominal pain did not improve with the Flagyl .  Reducing the dose of the Linzess  did not help.  She states that occurs on a daily basis.  Is more of a crampy lower abdominal pain.  She also continues to have a lot of bloating.  She is having bowel movements approximately 2-3 times a day.  They can be watery but small chunks of stool.  She still does not feel that she is completely evacuating her colon.  She denies any fevers or chills.  She denies any weight loss however the abdominal pain has been present now for almost 3 months.  She denies any melena or hematochezia.  Labs were unremarkable.  Patient also requests referral  to an orthopedist to discuss her right foot.  She has been seeing a podiatrist who has been performing cortisone injections in her foot.  However the pain is not improving.  She would like to get a second opinion. Past Medical History:  Diagnosis Date   Acid reflux    Arthritis    Depression    Family history of adverse reaction to anesthesia    Mother woke up and could hearduring surgery   Hypercholesterolemia    Hypertension    Insomnia    Osteoporosis    Panic attacks    panic attacks   Reflux    Restless leg syndrome    Past Surgical History:  Procedure Laterality Date   ABDOMINAL HYSTERECTOMY  2001   BLADDER SURGERY     BREAST BIOPSY  1987   BREAST CYST EXCISION  1998   CARPAL TUNNEL RELEASE Right 04/06/2015   Procedure: CARPAL TUNNEL RELEASE;  Surgeon: Taft FORBES Minerva, MD;  Location: [facility];  Service: Orthopedics;  Laterality: Right;   CARPAL TUNNEL RELEASE Left 06/30/2022   Procedure: LEFT CARPAL TUNNEL RELEASE;  Surgeon: Murrell Drivers, MD;  Location: [facility];  Service: Orthopedics;  Laterality: Left;   COLONOSCOPY  09/19/2004   MFM:Pwuzmwjo hemorrhoids, otherwise normal rectum/Sigmoid diverticula.  Remainder of colon mucosa appeared normal   COLONOSCOPY WITH ESOPHAGOGASTRODUODENOSCOPY (EGD) N/A 06/24/2013   MFM:Hjdumpr polyp-removed. Small hiatal hernia.  Abnormal gastric mucosa-query NSAID effect-s/p (chronic inactive gastritis, negative H. pylori)/TCS: Prominent internal hemorrhoids-likely source of hematochezia. Colonic diverticulosis. Colonic polyp-removed (tubular adenoma)   COLONOSCOPY WITH PROPOFOL  N/A 11/01/2018   Procedure: COLONOSCOPY WITH PROPOFOL ;  Surgeon: Shaaron Lamar HERO, MD;  Location: [facility];  Service: Endoscopy;  Laterality: N/A;  7:30am   CYSTOCELE REPAIR  2010   DILATION AND CURETTAGE OF UTERUS     JOINT REPLACEMENT N/A    [employer] 03/02/2020   KNEE SURGERY Right 2003   arthroscopic left   RECTAL SURGERY     RECTOCELE REPAIR   2010   REPLACEMENT TOTAL KNEE Right    TONSILLECTOMY     TOTAL KNEE ARTHROPLASTY Right 05/09/2019   Procedure: TOTAL KNEE ARTHROPLASTY;  Surgeon: Rubie Kemps, MD;  Location: [facility];  Service: Orthopedics;  Laterality: Right;   Current Outpatient Medications on File Prior to Visit  Medication Sig Dispense Refill   acyclovir (ZOVIRAX) 400 MG tablet Take 400 mg by mouth 3 (three) times daily.     ALPRAZolam  (XANAX ) 0.5 MG tablet TAKE ONE TABLET (0.5MG  TOTAL) BY MOUTH AT BEDTIME 30 tablet 3   Calcium-Magnesium-Vitamin D  (CALCIUM 1200+D3 PO) Take 1 tablet by mouth daily.     Carboxymethylcellul-Glycerin (LUBRICATING EYE DROPS OP) Place 1 drop into both eyes 2 (two) times daily.     denosumab  (PROLIA ) 60 MG/ML SOSY injection Inject 60 mg into the skin every 6 (six) months. Last injection 09/19/22 1 mL 1   estradiol  (ESTRACE ) 0.1 MG/GM vaginal cream Discard plastic applicator. Insert a blueberry size amount (approximately 1 gram) of cream on fingertip inside vagina at bedtime every night for 1 week then every other night. For long term use. 30 g 3   famotidine  (PEPCID ) 40 MG tablet TAKE ONE TABLET (40MG  TOTAL) BY MOUTH DAILY 90 tablet 0   gabapentin  (NEURONTIN ) 100 MG capsule TAKE ONE CAPSULE (100 MG TOTAL) BY MOUTH3 TIMES DAILY. 90 capsule 3   levocetirizine (XYZAL) 5 MG tablet Take 5 mg by mouth daily.     MELATONIN ER PO Take by mouth.     Multiple Vitamin (MULTIVITAMIN) tablet Take 1 tablet by mouth daily.     Omega-3 Fatty Acids (FISH OIL) 1200 MG CAPS Take 1,200 mg by mouth 2 (two) times daily.      pantoprazole  (PROTONIX ) 40 MG tablet Take 1 tablet (40 mg total) by mouth daily. 90 tablet 1   pravastatin  (PRAVACHOL ) 40 MG tablet TAKE ONE (1) TABLET BY MOUTH EVERY DAY 90 tablet 3   rOPINIRole  (REQUIP ) 2 MG tablet TAKE ONE TABLET (2MG  TOTAL) BY MOUTH AT BEDTIME 90 tablet 2   traMADol  (ULTRAM ) 50 MG tablet Take 1 tablet (50 mg total) by mouth every 6 (six) hours as needed. 90 tablet 0   Vibegron   (GEMTESA ) 75 MG TABS Take 1 tablet (75 mg total) by mouth daily. 30 tablet 11   cyclobenzaprine  (FLEXERIL ) 10 MG tablet TAKE ONE TABLET (10MG  TOTAL) BY MOUTH THREE TIMES DAILY AS NEEDED FOR MUSCLE SPASMS (Patient not taking: Reported on 07/12/2024) 90 tablet 3   fluticasone  (FLONASE ) 50 MCG/ACT nasal spray Use 2 sprays in each  nostril once daily (Patient not taking: Reported on 07/12/2024) 48 g 3   linaclotide  (LINZESS ) 72 MCG capsule Take 1 capsule (72 mcg total) by mouth daily before breakfast. (Patient not taking: Reported on 07/12/2024) 30 capsule 3   meloxicam  (MOBIC ) 7.5 MG tablet TAKE ONE (1) TABLET BY MOUTH EVERY DAY (Patient not taking: Reported on 07/12/2024)  30 tablet 0   No current facility-administered medications on file prior to visit.   No Known Allergies  Social History   Socioeconomic History   Marital status: Married    Spouse name: Not on file   Number of children: Not on file   Years of education: Not on file   Highest education level: 12th grade  Occupational History   Not on file  Tobacco Use   Smoking status: Never   Smokeless tobacco: Never  Vaping Use   Vaping status: Never Used  Substance and Sexual Activity   Alcohol use: No   Drug use: No   Sexual activity: Yes    Birth control/protection: Surgical  Other Topics Concern   Not on file  Social History Narrative   Not on file   Social Drivers of Health   Financial Resource Strain: Low Risk  (06/01/2024)   Overall Financial Resource Strain (CARDIA)    Difficulty of Paying Living Expenses: Not hard at all  Food Insecurity: No Food Insecurity (06/01/2024)   Hunger Vital Sign    Worried About Running Out of Food in the Last Year: Never true    Ran Out of Food in the Last Year: Never true  Transportation Needs: No Transportation Needs (06/01/2024)   PRAPARE - Administrator, Civil Service (Medical): No    Lack of Transportation (Non-Medical): No  Physical Activity: Insufficiently Active  (06/01/2024)   Exercise Vital Sign    Days of Exercise per Week: 1 day    Minutes of Exercise per Session: 20 min  Stress: No Stress Concern Present (06/01/2024)   [employer] of Occupational Health - Occupational Stress Questionnaire    Feeling of Stress: Only a little  Social Connections: Moderately Integrated (06/01/2024)   Social Connection and Isolation Panel    Frequency of Communication with Friends and Family: Twice a week    Frequency of Social Gatherings with Friends and Family: Three times a week    Attends Religious Services: More than 4 times per year    Active Member of Clubs or Organizations: No    Attends Banker Meetings: Never    Marital Status: Married  Catering manager Violence: Not At Risk (06/08/2024)   Humiliation, Afraid, Rape, and Kick questionnaire    Fear of Current or Ex-Partner: No    Emotionally Abused: No    Physically Abused: No    Sexually Abused: No      Review of Systems  All other systems reviewed and are negative.      Objective:   Physical Exam Vitals reviewed.  Constitutional:      Appearance: She is well-developed.  HENT:     Right Ear: No middle ear effusion. Tympanic membrane is not erythematous.     Left Ear:  No middle ear effusion. Tympanic membrane is not erythematous.     Mouth/Throat:     Pharynx: No oropharyngeal exudate, posterior oropharyngeal erythema or uvula swelling.     Tonsils: No tonsillar abscesses.  Cardiovascular:     Rate and Rhythm: Normal rate and regular rhythm.     Heart sounds: Normal heart sounds. No murmur heard. Pulmonary:     Effort: Pulmonary effort is normal. No respiratory distress.     Breath sounds: Normal breath sounds. No wheezing or rales.  Abdominal:     General: Bowel sounds are normal. There is no distension.     Palpations: Abdomen is soft.     Tenderness: There is abdominal  tenderness in the left lower quadrant. There is no guarding or rebound.  Musculoskeletal:      Cervical back: Neck supple.  Lymphadenopathy:     Cervical: No cervical adenopathy.           Assessment & Plan:  Immunization due - Plan: Flu vaccine HIGH DOSE PF(Fluzone  Trivalent)  Left lower quadrant abdominal pain - Plan: CT ABDOMEN PELVIS W CONTRAST  Right foot pain - Plan: Ambulatory referral to Orthopedic Surgery  At this point the patient has had almost 3 months of abdominal pain on a daily basis.  Labs were unremarkable.  I feel that we need to proceed with a CT scan of the abdomen and pelvis to evaluate for any structural lesions such as malignancy.  If the CT scan is unremarkable, I feel the patient is likely dealing with irritable bowel syndrome.  I would recommend trying Bentyl 20 mg every 6 hours as needed for cramps and coupled that with a probiotic.  She is already tried adding Metamucil with no benefit.  Also will refer the patient to orthopedic surgery for a second opinion regarding her right foot pain.

## 2024-07-13 ENCOUNTER — Ambulatory Visit (INDEPENDENT_AMBULATORY_CARE_PROVIDER_SITE_OTHER)

## 2024-07-13 DIAGNOSIS — N3281 Overactive bladder: Secondary | ICD-10-CM

## 2024-07-13 NOTE — Patient Instructions (Signed)

## 2024-07-13 NOTE — Progress Notes (Addendum)
 PTNS  Session # 3 of 12  PTNS was prescribed for the patient's Overactive Bladder symptoms. The voiding diary and/or patients symptoms were reviewed prior to the start of treatment.   Patient Goals: Reduced Urgency  Health & Social Factors: No change Caffeine: 3 Alcohol: no Daytime voids #per day: 7 Night-time voids #per night: 1 Urgency: yes mild Incontinence Episodes #per day: NO Treatment Plan/Comments: Reduced Caffeine  Ankle used: left Treatment Setting: 8 Feeling/ Response: positive sensation  PTNS Treatment The needle electrode was inserted into the lower, inner aspect of patient's leg. The surface electrode was placed on the inside arch of the foot on the treatment leg.The lead set was connected to the stimulator, and the needle electrode clip was connected to the needle electrode. The stimulator that produces an adjustable electrical pulse that travels to the sacral nerve plexus via the tibial nerve was increased until a patient response was observed.    Performed By: Carlos, CMA  Follow Up: Keep weekly PTNS

## 2024-07-18 ENCOUNTER — Other Ambulatory Visit: Payer: Self-pay

## 2024-07-18 ENCOUNTER — Telehealth: Payer: Self-pay | Admitting: "Endocrinology

## 2024-07-18 ENCOUNTER — Telehealth: Payer: Self-pay

## 2024-07-18 DIAGNOSIS — M810 Age-related osteoporosis without current pathological fracture: Secondary | ICD-10-CM

## 2024-07-18 MED ORDER — DENOSUMAB 60 MG/ML ~~LOC~~ SOSY
60.0000 mg | PREFILLED_SYRINGE | SUBCUTANEOUS | 0 refills | Status: AC
  Filled 2024-07-18 – 2024-07-19 (×2): qty 1, 180d supply, fill #0

## 2024-07-18 NOTE — Telephone Encounter (Signed)
 She will need labs. Lab order updated and sent to [facility].

## 2024-07-18 NOTE — Progress Notes (Signed)
 Opened in error

## 2024-07-18 NOTE — Telephone Encounter (Signed)
 Does she need labs before her prolia  ?

## 2024-07-18 NOTE — Telephone Encounter (Signed)
 Received notice pt needs a prior auth for her Prolia .

## 2024-07-19 ENCOUNTER — Other Ambulatory Visit: Payer: Self-pay

## 2024-07-19 ENCOUNTER — Other Ambulatory Visit (HOSPITAL_COMMUNITY): Payer: Self-pay

## 2024-07-19 ENCOUNTER — Telehealth: Payer: Self-pay

## 2024-07-19 NOTE — Progress Notes (Signed)
 Specialty Pharmacy Refill Coordination Note  Allison Freeman is a 78 y.o. female assessed today regarding refills of clinic administered specialty medication(s) Denosumab  (PROLIA )   Clinic requested Courier to Provider Office   Delivery date: 07/20/24   Verified address: [facility]- [address]   Medication will be filled on: 07/19/24

## 2024-07-19 NOTE — Telephone Encounter (Signed)
 Pharmacy Patient Advocate Encounter   Received notification from Pt Calls Messages that prior authorization for Prolia  60mg /ml is required/requested.   Insurance verification completed.   The patient is insured through [employer].   Per test claim: Refill too soon. PA is not needed at this time. Medication was filled 07/19/2024. Next eligible fill date is 10/02/2024.

## 2024-07-20 ENCOUNTER — Ambulatory Visit (INDEPENDENT_AMBULATORY_CARE_PROVIDER_SITE_OTHER)

## 2024-07-20 DIAGNOSIS — N3281 Overactive bladder: Secondary | ICD-10-CM

## 2024-07-20 NOTE — Patient Instructions (Signed)

## 2024-07-20 NOTE — Progress Notes (Signed)
 PTNS  Session # 4 of 12  PTNS was prescribed for the patient's Overactive Bladder symptoms. The voiding diary and/or patients symptoms were reviewed prior to the start of treatment.   Patient Goals: Reduced Urgency  Health & Social Factors:  No Change Caffeine: 2 Alcohol: no Daytime voids #per day: 7 Night-time voids #per night: 1 Urgency: yes Mild Incontinence Episodes #per day: 1 Treatment Plan/Comments: Reduced Caffeine  Ankle used: left Treatment Setting: 8 Feeling/ Response: positive sensation  PTNS Treatment The needle electrode was inserted into the lower, inner aspect of patient's leg. The surface electrode was placed on the inside arch of the foot on the treatment leg.The lead set was connected to the stimulator, and the needle electrode clip was connected to the needle electrode. The stimulator that produces an adjustable electrical pulse that travels to the sacral nerve plexus via the tibial nerve was increased until a patient response was observed.    Performed By: Carlos, CMA  Follow Up: Keep weekly PTNS

## 2024-07-26 ENCOUNTER — Ambulatory Visit (INDEPENDENT_AMBULATORY_CARE_PROVIDER_SITE_OTHER)

## 2024-07-26 DIAGNOSIS — N3281 Overactive bladder: Secondary | ICD-10-CM

## 2024-07-26 DIAGNOSIS — M810 Age-related osteoporosis without current pathological fracture: Secondary | ICD-10-CM | POA: Diagnosis not present

## 2024-07-26 NOTE — Progress Notes (Addendum)
 PTNS  Session # 5 of 12  PTNS was prescribed for the patient's Overactive Bladder symptoms. The voiding diary and/or patients symptoms were reviewed prior to the start of treatment.   Patient Goals: Reduced Urgency  Health & Social Factors:  No Change Caffeine: 2 Alcohol: no Daytime voids #per day: 7 Night-time voids #per night: 0 Urgency: no Incontinence Episodes #per day: 0 Treatment Plan/Comments: Reduced caffeine  Ankle used: left Treatment Setting: 8 Feeling/ Response: positive sensation  PTNS Treatment The needle electrode was inserted into the lower, inner aspect of patient's leg. The surface electrode was placed on the inside arch of the foot on the treatment leg.The lead set was connected to the stimulator, and the needle electrode clip was connected to the needle electrode. The stimulator that produces an adjustable electrical pulse that travels to the sacral nerve plexus via the tibial nerve was increased until a patient response was observed.    Performed By: Carlos, CMA  Follow Up: Keep weekly PTNS

## 2024-07-26 NOTE — Patient Instructions (Signed)

## 2024-07-27 LAB — COMPREHENSIVE METABOLIC PANEL WITH GFR
ALT: 20 IU/L (ref 0–32)
AST: 24 IU/L (ref 0–40)
Albumin: 4 g/dL (ref 3.8–4.8)
Alkaline Phosphatase: 48 IU/L — ABNORMAL LOW (ref 49–135)
BUN/Creatinine Ratio: 21 (ref 12–28)
BUN: 22 mg/dL (ref 8–27)
Bilirubin Total: 1.2 mg/dL (ref 0.0–1.2)
CO2: 23 mmol/L (ref 20–29)
Calcium: 9.5 mg/dL (ref 8.7–10.3)
Chloride: 105 mmol/L (ref 96–106)
Creatinine, Ser: 1.04 mg/dL — ABNORMAL HIGH (ref 0.57–1.00)
Globulin, Total: 2.7 g/dL (ref 1.5–4.5)
Glucose: 87 mg/dL (ref 70–99)
Potassium: 4.7 mmol/L (ref 3.5–5.2)
Sodium: 143 mmol/L (ref 134–144)
Total Protein: 6.7 g/dL (ref 6.0–8.5)
eGFR: 56 mL/min/1.73 — ABNORMAL LOW (ref >59)

## 2024-07-28 ENCOUNTER — Ambulatory Visit: Admitting: "Endocrinology

## 2024-07-28 ENCOUNTER — Encounter: Payer: Self-pay | Admitting: "Endocrinology

## 2024-07-28 VITALS — BP 128/76 | HR 88 | Ht 64.0 in | Wt 171.8 lb

## 2024-07-28 DIAGNOSIS — M810 Age-related osteoporosis without current pathological fracture: Secondary | ICD-10-CM

## 2024-07-28 MED ORDER — DENOSUMAB 60 MG/ML ~~LOC~~ SOSY
60.0000 mg | PREFILLED_SYRINGE | SUBCUTANEOUS | Status: AC
  Administered 2024-07-28: 60 mg via SUBCUTANEOUS

## 2024-07-28 NOTE — Progress Notes (Signed)
 Pt seen for follow up with Dr. Nida and Prolia  injection. Pt provided Prolia  60mg  ( Lot #[id number], Exp Date 09/21/2026). Prolia  60mg  given SQ in upper L arm without difficulty. Pt waited the required 15 minute post injection, no adverse reactions noted.

## 2024-07-28 NOTE — Progress Notes (Signed)
 07/28/2024, 1:42 PM   Endocrinology follow-up note  Subjective:    Patient ID: Allison Freeman, female    DOB: June 10, 1946, PCP Duanne Butler DASEN, MD   Past Medical History:  Diagnosis Date   Acid reflux    Arthritis    Depression    Family history of adverse reaction to anesthesia    Mother woke up and could hearduring surgery   Hypercholesterolemia    Hypertension    Insomnia    Osteoporosis    Panic attacks    panic attacks   Reflux    Restless leg syndrome    Past Surgical History:  Procedure Laterality Date   ABDOMINAL HYSTERECTOMY  2001   BLADDER SURGERY     BREAST BIOPSY  1987   BREAST CYST EXCISION  1998   CARPAL TUNNEL RELEASE Right 04/06/2015   Procedure: CARPAL TUNNEL RELEASE;  Surgeon: Taft FORBES Minerva, MD;  Location: [facility];  Service: Orthopedics;  Laterality: Right;   CARPAL TUNNEL RELEASE Left 06/30/2022   Procedure: LEFT CARPAL TUNNEL RELEASE;  Surgeon: Murrell Drivers, MD;  Location: [facility];  Service: Orthopedics;  Laterality: Left;   COLONOSCOPY  09/19/2004   MFM:Pwuzmwjo hemorrhoids, otherwise normal rectum/Sigmoid diverticula.  Remainder of colon mucosa appeared normal   COLONOSCOPY WITH ESOPHAGOGASTRODUODENOSCOPY (EGD) N/A 06/24/2013   MFM:Hjdumpr polyp-removed. Small hiatal hernia. Abnormal gastric mucosa-query NSAID effect-s/p (chronic inactive gastritis, negative H. pylori)/TCS: Prominent internal hemorrhoids-likely source of hematochezia. Colonic diverticulosis. Colonic polyp-removed (tubular adenoma)   COLONOSCOPY WITH PROPOFOL  N/A 11/01/2018   Procedure: COLONOSCOPY WITH PROPOFOL ;  Surgeon: Shaaron Lamar HERO, MD;  Location: [facility];  Service: Endoscopy;  Laterality: N/A;  7:30am   CYSTOCELE REPAIR  2010   DILATION AND CURETTAGE OF UTERUS     JOINT REPLACEMENT N/A    [employer]ia 03/02/2020   KNEE SURGERY Right 2003   arthroscopic left   RECTAL SURGERY     RECTOCELE REPAIR   2010   REPLACEMENT TOTAL KNEE Right    TONSILLECTOMY     TOTAL KNEE ARTHROPLASTY Right 05/09/2019   Procedure: TOTAL KNEE ARTHROPLASTY;  Surgeon: Rubie Kemps, MD;  Location: [facility];  Service: Orthopedics;  Laterality: Right;   Social History   Socioeconomic History   Marital status: Married    Spouse name: Not on file   Number of children: Not on file   Years of education: Not on file   Highest education level: 12th grade  Occupational History   Not on file  Tobacco Use   Smoking status: Never   Smokeless tobacco: Never  Vaping Use   Vaping status: Never Used  Substance and Sexual Activity   Alcohol use: No   Drug use: No   Sexual activity: Yes    Birth control/protection: Surgical  Other Topics Concern   Not on file  Social History Narrative   Not on file   Social Drivers of Health   Financial Resource Strain: Low Risk  (06/01/2024)   Overall Financial Resource Strain (CARDIA)    Difficulty of Paying Living Expenses: Not hard at all  Food Insecurity: No Food Insecurity (06/01/2024)   Hunger Vital Sign    Worried About Running Out of Food in the Last  Year: Never true    Ran Out of Food in the Last Year: Never true  Transportation Needs: No Transportation Needs (06/01/2024)   PRAPARE - Administrator, Civil Service (Medical): No    Lack of Transportation (Non-Medical): No  Physical Activity: Insufficiently Active (06/01/2024)   Exercise Vital Sign    Days of Exercise per Week: 1 day    Minutes of Exercise per Session: 20 min  Stress: No Stress Concern Present (06/01/2024)   [employer] of Occupational Health - Occupational Stress Questionnaire    Feeling of Stress: Only a little  Social Connections: Moderately Integrated (06/01/2024)   Social Connection and Isolation Panel    Frequency of Communication with Friends and Family: Twice a week    Frequency of Social Gatherings with Friends and Family: Three times a week    Attends Religious Services: More  than 4 times per year    Active Member of Clubs or Organizations: No    Attends Banker Meetings: Never    Marital Status: Married   Family History  Problem Relation Age of Onset   Breast cancer Mother    Hypertension Mother    Cancer Mother        Breast   Hyperlipidemia Mother    Osteoporosis Mother    Hyperlipidemia Father    Hypertension Father    Heart attack Father    Breast cancer Sister    Cancer Sister        Breast   Breast cancer Sister    Cancer Sister        breast   Breast cancer Sister    Colon cancer Neg Hx    Outpatient Encounter Medications as of 07/28/2024  Medication Sig   acyclovir (ZOVIRAX) 400 MG tablet Take 400 mg by mouth 3 (three) times daily.   ALPRAZolam  (XANAX ) 0.5 MG tablet TAKE ONE TABLET (0.5MG  TOTAL) BY MOUTH AT BEDTIME   Calcium-Magnesium-Vitamin D  (CALCIUM 1200+D3 PO) Take 1 tablet by mouth daily.   Carboxymethylcellul-Glycerin (LUBRICATING EYE DROPS OP) Place 1 drop into both eyes 2 (two) times daily.   cyclobenzaprine  (FLEXERIL ) 10 MG tablet TAKE ONE TABLET (10MG  TOTAL) BY MOUTH THREE TIMES DAILY AS NEEDED FOR MUSCLE SPASMS (Patient not taking: Reported on 07/12/2024)   denosumab  (PROLIA ) 60 MG/ML SOSY injection Inject 60 mg into the skin every 6 (six) months. Last injection 09/19/22   dicyclomine (BENTYL) 20 MG tablet Take 1 tablet (20 mg total) by mouth every 6 (six) hours as needed for spasms (and cramps).   estradiol  (ESTRACE ) 0.1 MG/GM vaginal cream Discard plastic applicator. Insert a blueberry size amount (approximately 1 gram) of cream on fingertip inside vagina at bedtime every night for 1 week then every other night. For long term use.   famotidine  (PEPCID ) 40 MG tablet TAKE ONE TABLET (40MG  TOTAL) BY MOUTH DAILY   fluticasone  (FLONASE ) 50 MCG/ACT nasal spray Use 2 sprays in each  nostril once daily (Patient not taking: Reported on 07/12/2024)   gabapentin  (NEURONTIN ) 100 MG capsule TAKE ONE CAPSULE (100 MG TOTAL) BY  MOUTH3 TIMES DAILY.   levocetirizine (XYZAL) 5 MG tablet Take 5 mg by mouth daily.   linaclotide  (LINZESS ) 72 MCG capsule Take 1 capsule (72 mcg total) by mouth daily before breakfast. (Patient not taking: Reported on 07/12/2024)   MELATONIN ER PO Take by mouth.   meloxicam  (MOBIC ) 7.5 MG tablet TAKE ONE (1) TABLET BY MOUTH EVERY DAY (Patient not taking: Reported on 07/12/2024)  Multiple Vitamin (MULTIVITAMIN) tablet Take 1 tablet by mouth daily.   Omega-3 Fatty Acids (FISH OIL) 1200 MG CAPS Take 1,200 mg by mouth 2 (two) times daily.    pantoprazole  (PROTONIX ) 40 MG tablet Take 1 tablet (40 mg total) by mouth daily.   pravastatin  (PRAVACHOL ) 40 MG tablet TAKE ONE (1) TABLET BY MOUTH EVERY DAY   rOPINIRole  (REQUIP ) 2 MG tablet TAKE ONE TABLET (2MG  TOTAL) BY MOUTH AT BEDTIME   traMADol  (ULTRAM ) 50 MG tablet Take 1 tablet (50 mg total) by mouth every 6 (six) hours as needed.   Vibegron  (GEMTESA ) 75 MG TABS Take 1 tablet (75 mg total) by mouth daily.   No facility-administered encounter medications on file as of 07/28/2024.   ALLERGIES: No Known Allergies  VACCINATION STATUS: Immunization History  Administered Date(s) Administered   Fluad Quad(high Dose 65+) 08/07/2020, 06/11/2021   Fluad Trivalent(High Dose 65+) 09/07/2023   INFLUENZA, HIGH DOSE SEASONAL PF 07/12/2024   Influenza,inj,Quad PF,6+ Mos 06/23/2014, 06/27/2015, 06/27/2016   Influenza-Unspecified 06/30/2013, 07/14/2017, 07/06/2018   PFIZER(Purple Top)SARS-COV-2 Vaccination 10/13/2019, 11/03/2019   Pneumococcal Conjugate-13 06/23/2014   Pneumococcal Polysaccharide-23 01/28/2016   Zoster Recombinant(Shingrix) 11/27/2017, 03/01/2018    HPI JOHNIKA ESCARENO is 78 y.o. female who presents today with a medical history as above. she is being seen in follow-up after she was seen in consultation for osteoporosis requested by Duanne Butler DASEN, MD.    Patient reports that she was diagnosed with osteoporosis based on bone density  approximately 3 years ago.  She has available DEXA scan results from 2007, 2010, 2022, 2024.  Right forearm bone density T-score was -2.3 in August 2022 and dropped to -2.9 in August 2024.    Lumbar spine was excluded due to advanced degenerative changes.  Patient denies any prior history of fracture particularly fragility fractures.  She reports 1 inch of height loss. As far as she recalls, she was not treated with any bisphosphonates before she was started on Prolia .  She denies any history of GERD.  She received at least 4 Prolia  injections, last dose was in July 2024.  She was due for 09 September 2023, however lost the arrangement for her to get it at her PMD office.   During her last visit, she expressed her desire to resume her Prolia  treatment. Subsequently, she was treated with her last dose in May 2025.  She tolerated the treatment very well.  She returns for her next treatment today. She has strong family history of osteoporosis in her mother as well as grandparents.  Her other medical problems include hypertension, hyperlipidemia. -She has no interval falls nor fractures.   Review of Systems  Constitutional: +mildly fluctuating body weight , no fatigue, no subjective hyperthermia, no subjective hypothermia   Objective:       07/28/2024    1:13 PM 07/12/2024    2:11 PM 06/08/2024   11:53 AM  Vitals with BMI  Height 5' 4 5' 4 5' 4  Weight 171 lbs 13 oz 168 lbs 4 oz 174 lbs  BMI 29.47 28.87 29.85  Systolic 128 136 --  Diastolic 76 74 --  Pulse 88 89     BP 128/76   Pulse 88   Ht 5' 4 (1.626 m)   Wt 171 lb 12.8 oz (77.9 kg)   BMI 29.49 kg/m   Wt Readings from Last 3 Encounters:  07/28/24 171 lb 12.8 oz (77.9 kg)  07/12/24 168 lb 4 oz (76.3 kg)  06/08/24 174 lb (78.9  kg)    Physical Exam  Constitutional:  Body mass index is 29.49 kg/m.,  not in acute distress, normal state of mind Eyes: PERRLA, EOMI, no exophthalmos  Musculoskeletal: + Mild to moderate  kyphotic deformity of thoracic spine,  strength intact in all four extremities, no peripheral edema   CMP ( most recent) CMP     Component Value Date/Time   NA 143 07/26/2024 0847   K 4.7 07/26/2024 0847   CL 105 07/26/2024 0847   CO2 23 07/26/2024 0847   GLUCOSE 87 07/26/2024 0847   GLUCOSE 104 (H) 05/09/2024 0000   BUN 22 07/26/2024 0847   CREATININE 1.04 (H) 07/26/2024 0847   CREATININE 1.15 (H) 05/09/2024 0000   CALCIUM 9.5 07/26/2024 0847   PROT 6.7 07/26/2024 0847   ALBUMIN 4.0 07/26/2024 0847   AST 24 07/26/2024 0847   ALT 20 07/26/2024 0847   ALKPHOS 48 (L) 07/26/2024 0847   BILITOT 1.2 07/26/2024 0847   EGFR 56 (L) 07/26/2024 0847   GFRNONAA 65 03/07/2021 0905      Lipid Panel ( most recent) Lipid Panel     Component Value Date/Time   CHOL 172 04/07/2024 1426   CHOL 169 10/19/2015 0808   TRIG 88 04/07/2024 1426   HDL 69 04/07/2024 1426   HDL 54 10/19/2015 0808   CHOLHDL 2.5 04/07/2024 1426   VLDL 19 03/05/2017 0821   LDLCALC 85 04/07/2024 1426   LABVLDL 19 10/19/2015 0808      Lab Results  Component Value Date   TSH 2.15 03/05/2020      Assessment & Plan:   1. Age-related osteoporosis without current pathological fracture (Primary)  - I have reviewed her available  records and clinically evaluated the patient. - Based on these reviews, she has osteoporosis disproportionately affecting her right wrist bones.  -She was intermittently treated with Prolia  in the past, resumed back on it after her last visit.  She has tolerated her Prolia  treatment in May 2025.   She is counseled to avoid  withdrawal of Prolia  treatment without a backup treatment to minimize the risk of rebound fracture. She is counseled on fall precautions, adequate nutrition. She will be continued on Prolia  treatment indefinitely or as long as she has access for it.   She will return in 6 months with repeat labs before her next treatment with Prolia . - she is advised to maintain  close follow up with Duanne Butler DASEN, MD for primary care needs.   I spent  21  minutes in the care of the patient today including review of labs from Thyroid  Function, CMP, and other relevant labs ; imaging/biopsy records (current and previous including abstractions from other facilities); face-to-face time discussing  her lab results and symptoms, medications doses, her options of short and long term treatment based on the latest standards of care / guidelines;   and documenting the encounter.  Allison Freeman  participated in the discussions, expressed understanding, and voiced agreement with the above plans.  All questions were answered to her satisfaction. she is encouraged to contact clinic should she have any questions or concerns prior to her return visit.   Follow up plan: Return in about 6 months (around 01/25/2025) for Prolia ([place]) Today (ASAP) and P(J) NV, F/U with Pre-visit Labs.   Ranny Earl, MD [facility] [address] [place], [place] [address] Phone: [phone]  Fax: [phone]     07/28/2024, 1:42 PM  This note was partially  dictated with voice recognition software. Similar sounding words can be transcribed inadequately or may not  be corrected upon review.

## 2024-07-28 NOTE — Addendum Note (Signed)
 Addended by: CLAUDENE ZADA POUR on: 07/28/2024 05:09 PM   Modules accepted: Orders

## 2024-07-29 ENCOUNTER — Ambulatory Visit: Admitting: "Endocrinology

## 2024-08-04 ENCOUNTER — Encounter: Payer: Self-pay | Admitting: Family Medicine

## 2024-08-05 ENCOUNTER — Ambulatory Visit (INDEPENDENT_AMBULATORY_CARE_PROVIDER_SITE_OTHER)

## 2024-08-05 DIAGNOSIS — N3281 Overactive bladder: Secondary | ICD-10-CM

## 2024-08-05 NOTE — Progress Notes (Signed)
 PTNS  Session # 6 of 12  PTNS was prescribed for the patient's Overactive Bladder symptoms. The voiding diary and/or patients symptoms were reviewed prior to the start of treatment.   Patient Goals: Reduced urgency and reduced frequency, sleep through the night   Health & Social Factors: No change  Caffeine: coffee 3 /day Alcohol: no Daytime voids #per day: 8 Night-time voids #per night: 1 Urgency: no Incontinence Episodes #per day: 0 Treatment Plan/Comments: Reduce caffeine   Ankle used: left Treatment Setting: 3 Feeling/ Response: tingle sensation on the bottom of her foot   PTNS Treatment The needle electrode was inserted into the lower, inner aspect of patient's leg. The surface electrode was placed on the inside arch of the foot on the treatment leg.The lead set was connected to the stimulator, and the needle electrode clip was connected to the needle electrode. The stimulator that produces an adjustable electrical pulse that travels to the sacral nerve plexus via the tibial nerve was increased until a patient response was observed.    Performed By: Exie LANES CMA  Follow Up: as scheduled

## 2024-08-05 NOTE — Patient Instructions (Signed)

## 2024-08-10 ENCOUNTER — Other Ambulatory Visit: Payer: Self-pay | Admitting: Family Medicine

## 2024-08-10 DIAGNOSIS — R140 Abdominal distension (gaseous): Secondary | ICD-10-CM

## 2024-08-12 ENCOUNTER — Ambulatory Visit

## 2024-08-12 ENCOUNTER — Telehealth: Payer: Self-pay

## 2024-08-12 DIAGNOSIS — N3281 Overactive bladder: Secondary | ICD-10-CM | POA: Diagnosis not present

## 2024-08-12 NOTE — Progress Notes (Signed)
 PTNS  Session # 7 of 12  PTNS was prescribed for the patient's Overactive Bladder symptoms. The voiding diary and/or patients symptoms were reviewed prior to the start of treatment.   Patient Goals: Reduced frequency  Health & Social Factors:  Caffeine: 3 Alcohol: no Daytime voids #per day: 7-8 Night-time voids #per night: 1 Urgency: yes mild Incontinence Episodes #per day: 0 Treatment Plan/Comments: no fluid before bed  Ankle used: left Treatment Setting: 8 Feeling/ Response: positive sensation  PTNS Treatment The needle electrode was inserted into the lower, inner aspect of patient's leg. The surface electrode was placed on the inside arch of the foot on the treatment leg.The lead set was connected to the stimulator, and the needle electrode clip was connected to the needle electrode. The stimulator that produces an adjustable electrical pulse that travels to the sacral nerve plexus via the tibial nerve was increased until a patient response was observed.    Performed By: Carlos, CMA  Follow Up: Keep weekly PTNS appointment

## 2024-08-12 NOTE — Telephone Encounter (Signed)
 Tried calling pt with no answer. LVM making her aware of upcoming appointment for PTNS.

## 2024-08-12 NOTE — Patient Instructions (Signed)

## 2024-08-12 NOTE — Telephone Encounter (Signed)
 Requested Prescriptions  Pending Prescriptions Disp Refills   pantoprazole  (PROTONIX ) 40 MG tablet [Pharmacy Med Name: PANTOPRAZOLE  SODIUM 40 MG DR TAB] 180 tablet 0    Sig: TAKE ONE TABLET TWICE DAILY BEFORE MEALS     Gastroenterology: Proton Pump Inhibitors Passed - 08/12/2024  2:58 PM      Passed - Valid encounter within last 12 months    Recent Outpatient Visits           1 month ago Immunization due   [place] [facility] Pickard, Butler DASEN, MD   3 months ago Generalized abdominal pain   [place] [facility] Duanne, Butler DASEN, MD   4 months ago Pure hypercholesterolemia   [place] [facility] Duanne, Butler DASEN, MD   5 months ago Dysuria   [place] [facility] Kayla Jeoffrey RAMAN, FNP   10 months ago Osteoporosis, unspecified osteoporosis type, unspecified pathological fracture presence    [facility] Pickard, Butler DASEN, MD       Future Appointments             In 2 months Matilda Senior, MD [facility] [place]

## 2024-08-15 ENCOUNTER — Other Ambulatory Visit: Payer: Self-pay | Admitting: Family Medicine

## 2024-08-16 ENCOUNTER — Ambulatory Visit

## 2024-08-16 DIAGNOSIS — N3281 Overactive bladder: Secondary | ICD-10-CM | POA: Diagnosis not present

## 2024-08-16 NOTE — Patient Instructions (Signed)

## 2024-08-16 NOTE — Progress Notes (Addendum)
 PTNS  Session # 8 of 12  PTNS was prescribed for the patient's Overactive Bladder symptoms. The voiding diary and/or patients symptoms were reviewed prior to the start of treatment.   Patient Goals: reduced Urgency  Health & Social Factors: no change Caffeine: 3 Alcohol: no Daytime voids #per day: 8 Night-time voids #per night: 1 Urgency: yes mild Incontinence Episodes #per day: 1 Treatment Plan/Comments: no fluid before bed  Ankle used: left Treatment Setting: 7 Feeling/ Response: positive sensation  PTNS Treatment The needle electrode was inserted into the lower, inner aspect of patient's leg. The surface electrode was placed on the inside arch of the foot on the treatment leg.The lead set was connected to the stimulator, and the needle electrode clip was connected to the needle electrode. The stimulator that produces an adjustable electrical pulse that travels to the sacral nerve plexus via the tibial nerve was increased until a patient response was observed.    Performed By: Carlos, CMA  Follow Up: Keep weekly PTNS

## 2024-08-16 NOTE — Telephone Encounter (Signed)
 Requested medications are due for refill today.  yes  Requested medications are on the active medications list.  yes  Last refill. 06/27/2024 #90 0 rf  Future visit scheduled.   no  Notes to clinic.  Refill not delegated    Requested Prescriptions  Pending Prescriptions Disp Refills   traMADol  (ULTRAM ) 50 MG tablet [Pharmacy Med Name: TRAMADOL  HCL 50 MG TAB] 90 tablet     Sig: TAKE ONE TABLET (50MG  TOTAL) BY MOUTH EVERY SIX HOURS AS NEEDED     Not Delegated - Analgesics:  Opioid Agonists Failed - 08/16/2024 11:36 AM      Failed - This refill cannot be delegated      Failed - Urine Drug Screen completed in last 360 days      Passed - Valid encounter within last 3 months    Recent Outpatient Visits           1 month ago Immunization due   [place] [facility] Pickard, Butler DASEN, MD   3 months ago Generalized abdominal pain   [place] [facility] Duanne, Butler DASEN, MD   4 months ago Pure hypercholesterolemia   [place] [facility] Pickard, Butler DASEN, MD   5 months ago Dysuria   [place] [facility] Kayla Jeoffrey RAMAN, FNP   10 months ago Osteoporosis, unspecified osteoporosis type, unspecified pathological fracture presence   [place] [facility] Pickard, Butler DASEN, MD       Future Appointments             In 2 months Matilda Senior, MD [facility] [place]

## 2024-08-22 ENCOUNTER — Ambulatory Visit

## 2024-08-22 ENCOUNTER — Ambulatory Visit (HOSPITAL_COMMUNITY)
Admission: RE | Admit: 2024-08-22 | Discharge: 2024-08-22 | Disposition: A | Discharge status: Home / Self Care | Source: Ambulatory Visit | Attending: Family Medicine | Admitting: Family Medicine

## 2024-08-22 DIAGNOSIS — M19071 Primary osteoarthritis, right ankle and foot: Secondary | ICD-10-CM | POA: Diagnosis not present

## 2024-08-22 DIAGNOSIS — M2042 Other hammer toe(s) (acquired), left foot: Secondary | ICD-10-CM | POA: Diagnosis not present

## 2024-08-22 DIAGNOSIS — R1032 Left lower quadrant pain: Secondary | ICD-10-CM | POA: Insufficient documentation

## 2024-08-22 DIAGNOSIS — M79671 Pain in right foot: Secondary | ICD-10-CM | POA: Diagnosis not present

## 2024-08-22 DIAGNOSIS — N3281 Overactive bladder: Secondary | ICD-10-CM | POA: Diagnosis not present

## 2024-08-22 DIAGNOSIS — M79672 Pain in left foot: Secondary | ICD-10-CM | POA: Diagnosis not present

## 2024-08-22 DIAGNOSIS — M19072 Primary osteoarthritis, left ankle and foot: Secondary | ICD-10-CM | POA: Diagnosis not present

## 2024-08-22 DIAGNOSIS — K5730 Diverticulosis of large intestine without perforation or abscess without bleeding: Secondary | ICD-10-CM | POA: Diagnosis not present

## 2024-08-22 MED ORDER — IOHEXOL 300 MG/ML  SOLN
100.0000 mL | Freq: Once | INTRAMUSCULAR | Status: AC | PRN
  Administered 2024-08-22: 100 mL via INTRAVENOUS

## 2024-08-22 MED ORDER — IOHEXOL 9 MG/ML PO SOLN
ORAL | Status: AC
  Filled 2024-08-22: qty 1000

## 2024-08-22 MED ORDER — IOHEXOL 9 MG/ML PO SOLN
500.0000 mL | ORAL | Status: AC
  Administered 2024-08-22: 1000 mL via ORAL

## 2024-08-22 NOTE — Progress Notes (Signed)
 PTNS  Session # 9 of 12   PTNS was prescribed for the patient's Overactive Bladder symptoms. The voiding diary and/or patients symptoms were reviewed prior to the start of treatment.   Patient Goals: Reduced Urgency  Health & Social Factors:  No Change Caffeine: 3 Alcohol: no Daytime voids #per day: 7 Night-time voids #per night: 1 Urgency: yes Mild Incontinence Episodes #per day: 0 Treatment Plan/Comments: Reduced Fluids   Ankle used: left Treatment Setting: 6 Feeling/ Response: positive sensation  PTNS Treatment The needle electrode was inserted into the lower, inner aspect of patient's leg. The surface electrode was placed on the inside arch of the foot on the treatment leg.The lead set was connected to the stimulator, and the needle electrode clip was connected to the needle electrode. The stimulator that produces an adjustable electrical pulse that travels to the sacral nerve plexus via the tibial nerve was increased until a patient response was observed.    Performed By: Carlos, CMA  Follow Up: Keep weekly PTNS

## 2024-08-23 ENCOUNTER — Other Ambulatory Visit: Payer: Self-pay | Admitting: Family Medicine

## 2024-08-25 ENCOUNTER — Ambulatory Visit: Payer: Self-pay | Admitting: Family Medicine

## 2024-08-25 DIAGNOSIS — R1032 Left lower quadrant pain: Secondary | ICD-10-CM

## 2024-08-25 DIAGNOSIS — K5900 Constipation, unspecified: Secondary | ICD-10-CM

## 2024-08-25 DIAGNOSIS — K5909 Other constipation: Secondary | ICD-10-CM

## 2024-08-25 DIAGNOSIS — R1084 Generalized abdominal pain: Secondary | ICD-10-CM

## 2024-08-25 DIAGNOSIS — Z860100 Personal history of colon polyps, unspecified: Secondary | ICD-10-CM

## 2024-08-25 DIAGNOSIS — K219 Gastro-esophageal reflux disease without esophagitis: Secondary | ICD-10-CM

## 2024-08-26 ENCOUNTER — Ambulatory Visit (INDEPENDENT_AMBULATORY_CARE_PROVIDER_SITE_OTHER): Admitting: Otolaryngology

## 2024-08-26 ENCOUNTER — Ambulatory Visit

## 2024-08-26 ENCOUNTER — Encounter: Payer: Self-pay | Admitting: Family Medicine

## 2024-08-26 ENCOUNTER — Encounter (INDEPENDENT_AMBULATORY_CARE_PROVIDER_SITE_OTHER): Payer: Self-pay | Admitting: Otolaryngology

## 2024-08-26 VITALS — BP 145/75 | HR 90 | Ht 64.0 in | Wt 168.0 lb

## 2024-08-26 DIAGNOSIS — R0981 Nasal congestion: Secondary | ICD-10-CM

## 2024-08-26 DIAGNOSIS — Z9622 Myringotomy tube(s) status: Secondary | ICD-10-CM

## 2024-08-26 DIAGNOSIS — H7201 Central perforation of tympanic membrane, right ear: Secondary | ICD-10-CM

## 2024-08-26 DIAGNOSIS — H6981 Other specified disorders of Eustachian tube, right ear: Secondary | ICD-10-CM | POA: Diagnosis not present

## 2024-08-26 DIAGNOSIS — J310 Chronic rhinitis: Secondary | ICD-10-CM

## 2024-08-26 MED ORDER — FLUTICASONE PROPIONATE 50 MCG/ACT NA SUSP: 2.0000 | Freq: Every day | NASAL | 10 refills | Status: AC

## 2024-08-26 NOTE — Progress Notes (Signed)
 Patient ID: Allison Freeman, female   DOB: 02/25/1946, 78 y.o.   MRN: [id number]  Follow up: Chronic rhinitis, right ear eustachian tube dysfunction  History of Present Illness Allison Freeman is a 78 year old female who returns today for her follow-up evaluation.  The patient has a history of chronic rhinitis, right ear eustachian tube dysfunction, and right middle ear effusion.  She was treated with right ventilating tube placement.  According to the patient, her nasal congestion has progressively worsened over the past six months. She uses Flonase  daily for symptom management but continues to experience a sensation of being 'clogged up'. She frequently feels hoarse and often needs to clear her throat.  She has a history of right-sided eustachian tube dysfunction, for which a tube is in place. There are no significant issues with the tube, and no changes in her hearing are reported. She has hearing aids but has not been using them.  She requests a refill for Flonase , which she obtains from [employer].   Exam: General: Communicates without difficulty, well nourished, no acute distress. Head: Normocephalic, no evidence injury, no tenderness, facial buttresses intact without stepoff. Face/sinus: No tenderness to palpation and percussion. Facial movement is normal and symmetric. Eyes: PERRL, EOMI. No scleral icterus, conjunctivae clear. Neuro: CN II exam reveals vision grossly intact.  No nystagmus at any point of gaze. Ears: Auricles well formed without lesions.  Ear canals are intact without mass or lesion.  No erythema or edema is appreciated.  The right ventilating tube is in place and patent.  Nose: External evaluation reveals normal support and skin without lesions.  Dorsum is intact.  Anterior rhinoscopy reveals congested mucosa over anterior aspect of inferior turbinates and intact septum.  No purulence noted. Oral:  Oral cavity and oropharynx are intact, symmetric, without erythema  or edema.  Mucosa is moist without lesions. Neck: Full range of motion without pain.  There is no significant lymphadenopathy.  No masses palpable.  Thyroid  bed within normal limits to palpation.  Parotid glands and submandibular glands equal bilaterally without mass.  Trachea is midline. Neuro:  CN 2-12 grossly intact.    Assessment and Plan Assessment & Plan Chronic rhinitis with nasal congestion Chronic rhinitis with nasal congestion, slightly worsened over the last six months. Nasal examination reveals mild congestion.  - Continue Flonase  2 sprays nasal daily - Refilled Flonase  with multiple refills - Recommended Mucinex to thin mucus  Right ear eustachian tube dysfunction (status post tube placement) Right ear eustachian tube dysfunction managed with tube placement. No changes in hearing reported. Tube remains in place and is functioning well. - Continue current management with tube in place - Continue dry ear precaution on the right side.

## 2024-08-29 ENCOUNTER — Other Ambulatory Visit (HOSPITAL_COMMUNITY): Payer: Self-pay

## 2024-08-31 ENCOUNTER — Other Ambulatory Visit (HOSPITAL_COMMUNITY): Payer: Self-pay

## 2024-09-01 ENCOUNTER — Telehealth: Payer: Self-pay

## 2024-09-01 NOTE — Telephone Encounter (Signed)
 Pt has called and states that her insurance is requiring a PA for her Alprazolam . Thank you for your assistance!

## 2024-09-02 ENCOUNTER — Ambulatory Visit

## 2024-09-02 ENCOUNTER — Encounter: Payer: Self-pay | Admitting: Gastroenterology

## 2024-09-02 DIAGNOSIS — N3281 Overactive bladder: Secondary | ICD-10-CM | POA: Diagnosis not present

## 2024-09-02 NOTE — Telephone Encounter (Signed)
 Good morning MJ, I need an ICD 10 code for her alprazolam  and I couldn't find any progress notes about alprazolam   Thanks!

## 2024-09-02 NOTE — Patient Instructions (Signed)

## 2024-09-02 NOTE — Progress Notes (Signed)
 PTNS  Session # 10 of 12  PTNS was prescribed for the patient's Overactive Bladder symptoms. The voiding diary and/or patients symptoms were reviewed prior to the start of treatment.   Patient Goals: reduced urgency, reduced frequency and sleep through the night   Health & Social Factors: No change  Caffeine: coffee 3 /day Alcohol: no Daytime voids #per day: 8 Night-time voids #per night: 1 Urgency: mild Incontinence Episodes #per day: once a week Treatment Plan/Comments: reduce fluids, reduce caffeine and no fluids before bed   Ankle used: left Treatment Setting: 4 Feeling/ Response: positive sensation in ankle  PTNS Treatment The needle electrode was inserted into the lower, inner aspect of patient's leg. The surface electrode was placed on the inside arch of the foot on the treatment leg.The lead set was connected to the stimulator, and the needle electrode clip was connected to the needle electrode. The stimulator that produces an adjustable electrical pulse that travels to the sacral nerve plexus via the tibial nerve was increased until a patient response was observed.    Performed By: Exie LANES CMA  Follow Up: as scheduled

## 2024-09-05 ENCOUNTER — Encounter: Payer: Self-pay | Admitting: Family Medicine

## 2024-09-05 ENCOUNTER — Telehealth: Payer: Self-pay | Admitting: Pharmacy Technician

## 2024-09-05 ENCOUNTER — Other Ambulatory Visit (HOSPITAL_COMMUNITY): Payer: Self-pay

## 2024-09-05 NOTE — Telephone Encounter (Signed)
 Thanks so much MJ, I will submit this morning!

## 2024-09-05 NOTE — Telephone Encounter (Signed)
 Pharmacy Patient Advocate Encounter  Received notification from [employer] that Prior Authorization for ALPRAZolam  0.5MG  tablets has been APPROVED from 09/23/23 to 09/21/25. Ran test claim, Copay is $0.00. This test claim was processed through [facility]- copay amounts may vary at other pharmacies due to pharmacy/plan contracts, or as the patient moves through the different stages of their insurance plan.   PA #/Case ID/Reference #: [id number]

## 2024-09-05 NOTE — Telephone Encounter (Signed)
 Pharmacy Patient Advocate Encounter   Received notification from Pt Calls Messages that prior authorization for ALPRAZolam  0.5MG  tablets is required/requested.   Insurance verification completed.   The patient is insured through [place].   Per test claim: PA required; PA started via CoverMyMeds. KEY [id number] . Waiting for clinical questions to populate.

## 2024-09-06 ENCOUNTER — Ambulatory Visit

## 2024-09-06 DIAGNOSIS — N3281 Overactive bladder: Secondary | ICD-10-CM

## 2024-09-06 NOTE — Progress Notes (Signed)
 PTNS  Session # 11 of 12  PTNS was prescribed for the patient's Overactive Bladder symptoms. The voiding diary and/or patients symptoms were reviewed prior to the start of treatment.   Patient Goals: sleep through the night  Health & Social Factors:  no change Caffeine: 4 Alcohol: no Daytime voids #per day: 7-8 Night-time voids #per night: 1-2 Urgency: yes mild Incontinence Episodes #per day: 0 Treatment Plan/Comments: Reduced Caffeine  Ankle used: left Treatment Setting: 6 Feeling/ Response: positive sensation  PTNS Treatment The needle electrode was inserted into the lower, inner aspect of patient's leg. The surface electrode was placed on the inside arch of the foot on the treatment leg.The lead set was connected to the stimulator, and the needle electrode clip was connected to the needle electrode. The stimulator that produces an adjustable electrical pulse that travels to the sacral nerve plexus via the tibial nerve was increased until a patient response was observed.    Performed By: Carlos, CMA  Follow Up: Keep weekly PTNS

## 2024-09-06 NOTE — Patient Instructions (Signed)

## 2024-09-07 ENCOUNTER — Telehealth: Payer: Self-pay

## 2024-09-07 ENCOUNTER — Other Ambulatory Visit (HOSPITAL_COMMUNITY): Payer: Self-pay

## 2024-09-07 NOTE — Telephone Encounter (Signed)
 Fax received from [employer] for re-verification of benefits for pt's Prolia . Thank you!

## 2024-09-07 NOTE — Telephone Encounter (Signed)
 Patient received Prolia  07/28/24 with Dr. Lenis. Patient is not due again until May. We can reverify benefits closer to due date.

## 2024-09-12 ENCOUNTER — Other Ambulatory Visit: Payer: Self-pay | Admitting: Family Medicine

## 2024-09-13 ENCOUNTER — Ambulatory Visit (INDEPENDENT_AMBULATORY_CARE_PROVIDER_SITE_OTHER)

## 2024-09-13 ENCOUNTER — Ambulatory Visit

## 2024-09-13 DIAGNOSIS — N3281 Overactive bladder: Secondary | ICD-10-CM

## 2024-09-13 NOTE — Progress Notes (Signed)
 PTNS  Session # 12 of 45  PTNS was prescribed for the patient's Overactive Bladder symptoms. The voiding diary and/or patients symptoms were reviewed prior to the start of treatment.   Patient Goals: Reduced Urgency  Health & Social Factors: No Change  Caffeine: 3 Alcohol: no Daytime voids #per day: 7-8 Night-time voids #per night: 1 Urgency: yes mild Incontinence Episodes #per day: 0 Treatment Plan/Comments: Reduced Caffeine  Ankle used: left Treatment Setting: 5 Feeling/ Response: positive sensation  PTNS Treatment The needle electrode was inserted into the lower, inner aspect of patient's leg. The surface electrode was placed on the inside arch of the foot on the treatment leg.The lead set was connected to the stimulator, and the needle electrode clip was connected to the needle electrode. The stimulator that produces an adjustable electrical pulse that travels to the sacral nerve plexus via the tibial nerve was increased until a patient response was observed.    Performed By: Carlos, CMA  Follow Up: Keep OV

## 2024-09-18 NOTE — Progress Notes (Unsigned)
 "  GI Office Note    Referring Provider: Duanne Butler DASEN, MD Primary Care Physician:  Duanne Butler DASEN, MD  Primary Gastroenterologist: Lamar HERO.Rourk, MD  Chief Complaint   No chief complaint on file.  History of Present Illness   GALIT URICH is a 78 y.o. female presenting today at the request of Pickard, Warren T, MD for ***constipation, LLQ abdominal pain, GERD, history of colon polyps  EGD October 2014: - normal esophagus - gastric polyp removed - focal erosions of posterior gastric wall - no ulcer - 4 mm benign appearing polyp in the cardia - small hiatal hernia - path: chronic inactive gastritis, hyperplastic polyp - advised to stop pantoprazole  and start dexilant 60 mg daily.   Colonoscopy October 2014: - Prominent internal hemorrhoids - colonic diverticulosis - diminutive polyp at base of cecum (tubular adenoma) - advised to consider hemorrhoid banding.  No seen in the office since December 2019 by Sonny Kerns, PA-C. *** Advised to stop ranitidine  and start famotidine  40 mg at bed and was scheduled for colonoscopy.   Colonoscopy Feb 2020: - Diverticulosis in the sigmoid colon and in the descending colon. - Internal hemorrhoids, Grade I - The examination was otherwise normal on direct and retroflexion views.  - No specimens collected. - No repeat colonoscopy recommended.   Today:  Discussed the use of AI scribe software for clinical note transcription with the patient, who gave verbal consent to proceed.     Wt Readings from Last 6 Encounters:  08/26/24 168 lb (76.2 kg)  07/28/24 171 lb 12.8 oz (77.9 kg)  07/12/24 168 lb 4 oz (76.3 kg)  06/08/24 174 lb (78.9 kg)  05/09/24 174 lb (78.9 kg)  04/07/24 172 lb 3.2 oz (78.1 kg)    There is no height or weight on file to calculate BMI.  Current Outpatient Medications  Medication Sig Dispense Refill   acyclovir (ZOVIRAX) 400 MG tablet Take 400 mg by mouth 3 (three) times daily.     ALPRAZolam   (XANAX ) 0.5 MG tablet TAKE ONE TABLET (0.5MG  TOTAL) BY MOUTH AT BEDTIME 30 tablet 3   Calcium-Magnesium-Vitamin D  (CALCIUM 1200+D3 PO) Take 1 tablet by mouth daily.     Carboxymethylcellul-Glycerin (LUBRICATING EYE DROPS OP) Place 1 drop into both eyes 2 (two) times daily.     cyclobenzaprine  (FLEXERIL ) 10 MG tablet TAKE ONE TABLET (10MG  TOTAL) BY MOUTH THREE TIMES DAILY AS NEEDED FOR MUSCLE SPASMS (Patient not taking: Reported on 08/26/2024) 90 tablet 3   denosumab  (PROLIA ) 60 MG/ML SOSY injection Inject 60 mg into the skin every 6 (six) months. Last injection 09/19/22 1 mL 0   dicyclomine  (BENTYL ) 20 MG tablet TAKE 1 TABLET BY MOUTH EVERY 6 HOURS AS NEEDED FOR SPASMS AND CRAMPS 30 tablet 0   estradiol  (ESTRACE ) 0.1 MG/GM vaginal cream Discard plastic applicator. Insert a blueberry size amount (approximately 1 gram) of cream on fingertip inside vagina at bedtime every night for 1 week then every other night. For long term use. 30 g 3   famotidine  (PEPCID ) 40 MG tablet TAKE ONE TABLET (40MG  TOTAL) BY MOUTH DAILY 90 tablet 0   fluticasone  (FLONASE ) 50 MCG/ACT nasal spray Use 2 sprays in each  nostril once daily 48 g 3   fluticasone  (FLONASE ) 50 MCG/ACT nasal spray Place 2 sprays into both nostrils daily. 16 g 10   gabapentin  (NEURONTIN ) 100 MG capsule TAKE ONE CAPSULE (100 MG TOTAL) BY MOUTH3 TIMES DAILY. 90 capsule 3   levocetirizine (XYZAL) 5 MG tablet  Take 5 mg by mouth daily.     linaclotide  (LINZESS ) 72 MCG capsule Take 1 capsule (72 mcg total) by mouth daily before breakfast. 30 capsule 3   MELATONIN ER PO Take by mouth.     meloxicam  (MOBIC ) 7.5 MG tablet TAKE ONE (1) TABLET BY MOUTH EVERY DAY (Patient not taking: Reported on 08/26/2024) 30 tablet 0   Multiple Vitamin (MULTIVITAMIN) tablet Take 1 tablet by mouth daily.     Omega-3 Fatty Acids (FISH OIL) 1200 MG CAPS Take 1,200 mg by mouth 2 (two) times daily.      pantoprazole  (PROTONIX ) 40 MG tablet TAKE ONE TABLET TWICE DAILY BEFORE MEALS 180  tablet 0   pravastatin  (PRAVACHOL ) 40 MG tablet TAKE ONE (1) TABLET BY MOUTH EVERY DAY 90 tablet 3   rOPINIRole  (REQUIP ) 2 MG tablet TAKE ONE TABLET (2MG  TOTAL) BY MOUTH AT BEDTIME 90 tablet 2   traMADol  (ULTRAM ) 50 MG tablet Take 1 tablet (50 mg total) by mouth every 6 (six) hours as needed. 90 tablet 0   Vibegron  (GEMTESA ) 75 MG TABS Take 1 tablet (75 mg total) by mouth daily. 30 tablet 11   Current Facility-Administered Medications  Medication Dose Route Frequency Provider Last Rate Last Admin   denosumab  (PROLIA ) injection 60 mg  60 mg Subcutaneous Q6 months Nida, Gebreselassie W, MD   60 mg at 07/28/24 1708    Past Medical History:  Diagnosis Date   Acid reflux    Arthritis    Depression    Family history of adverse reaction to anesthesia    Mother woke up and could hearduring surgery   Hypercholesterolemia    Hypertension    Insomnia    Osteoporosis    Panic attacks    panic attacks   Reflux    Restless leg syndrome     Past Surgical History:  Procedure Laterality Date   ABDOMINAL HYSTERECTOMY  2001   BLADDER SURGERY     BREAST BIOPSY  1987   BREAST CYST EXCISION  1998   CARPAL TUNNEL RELEASE Right 04/06/2015   Procedure: CARPAL TUNNEL RELEASE;  Surgeon: Taft FORBES Minerva, MD;  Location: [facility];  Service: Orthopedics;  Laterality: Right;   CARPAL TUNNEL RELEASE Left 06/30/2022   Procedure: LEFT CARPAL TUNNEL RELEASE;  Surgeon: Murrell Drivers, MD;  Location: [facility];  Service: Orthopedics;  Laterality: Left;   COLONOSCOPY  09/19/2004   MFM:Pwuzmwjo hemorrhoids, otherwise normal rectum/Sigmoid diverticula.  Remainder of colon mucosa appeared normal   COLONOSCOPY WITH ESOPHAGOGASTRODUODENOSCOPY (EGD) N/A 06/24/2013   MFM:Hjdumpr polyp-removed. Small hiatal hernia. Abnormal gastric mucosa-query NSAID effect-s/p (chronic inactive gastritis, negative H. pylori)/TCS: Prominent internal hemorrhoids-likely source of hematochezia. Colonic diverticulosis. Colonic  polyp-removed (tubular adenoma)   COLONOSCOPY WITH PROPOFOL  N/A 11/01/2018   Procedure: COLONOSCOPY WITH PROPOFOL ;  Surgeon: Shaaron Lamar HERO, MD;  [facility]: [facility];  Service: Endoscopy;  Laterality: N/A;  7:30am   CYSTOCELE REPAIR  2010   DILATION AND CURETTAGE OF UTERUS     JOINT REPLACEMENT N/A    Phreesia 03/02/2020   KNEE SURGERY Right 2003   arthroscopic left   RECTAL SURGERY     RECTOCELE REPAIR  2010   REPLACEMENT TOTAL KNEE Right    TONSILLECTOMY     TOTAL KNEE ARTHROPLASTY Right 05/09/2019   Procedure: TOTAL KNEE ARTHROPLASTY;  Surgeon: Rubie Kemps, MD;  Location: [facility];  Service: Orthopedics;  Laterality: Right;    Family History  Problem Relation Age of Onset   Breast cancer  Mother    Hypertension Mother    Cancer Mother        Breast   Hyperlipidemia Mother    Osteoporosis Mother    Hyperlipidemia Father    Hypertension Father    Heart attack Father    Breast cancer Sister    Cancer Sister        Breast   Breast cancer Sister    Cancer Sister        breast   Breast cancer Sister    Colon cancer Neg Hx     Allergies as of 09/19/2024   (No Active Allergies)    Social History   Socioeconomic History   Marital status: Married    Spouse name: Not on file   Number of children: Not on file   Years of education: Not on file   Highest education level: 12th grade  Occupational History   Not on file  Tobacco Use   Smoking status: Never   Smokeless tobacco: Never  Vaping Use   Vaping status: Never Used  Substance and Sexual Activity   Alcohol use: No   Drug use: No   Sexual activity: Yes    Birth control/protection: Surgical  Other Topics Concern   Not on file  Social History Narrative   Not on file   Social Drivers of Health   Tobacco Use: Low Risk (08/26/2024)   Patient History    Smoking Tobacco Use: Never    Smokeless Tobacco Use: Never    Passive Exposure: Not on file  Financial Resource Strain: Low Risk (06/01/2024)   Overall  Financial Resource Strain (CARDIA)    Difficulty of Paying Living Expenses: Not hard at all  Food Insecurity: No Food Insecurity (06/01/2024)   Epic    Worried About Radiation Protection Practitioner of Food in the Last Year: Never true    Ran Out of Food in the Last Year: Never true  Transportation Needs: No Transportation Needs (06/01/2024)   Epic    Lack of Transportation (Medical): No    Lack of Transportation (Non-Medical): No  Physical Activity: Insufficiently Active (06/01/2024)   Exercise Vital Sign    Days of Exercise per Week: 1 day    Minutes of Exercise per Session: 20 min  Stress: No Stress Concern Present (06/01/2024)   [employer] of Occupational Health - Occupational Stress Questionnaire    Feeling of Stress: Only a little  Social Connections: Moderately Integrated (06/01/2024)   Social Connection and Isolation Panel    Frequency of Communication with Friends and Family: Twice a week    Frequency of Social Gatherings with Friends and Family: Three times a week    Attends Religious Services: More than 4 times per year    Active Member of Clubs or Organizations: No    Attends Banker Meetings: Never    Marital Status: Married  Catering Manager Violence: Not At Risk (06/08/2024)   Epic    Fear of Current or Ex-Partner: No    Emotionally Abused: No    Physically Abused: No    Sexually Abused: No  Depression (PHQ2-9): Low Risk (06/08/2024)   Depression (PHQ2-9)    PHQ-2 Score: 2  Alcohol Screen: Low Risk (06/01/2024)   Alcohol Screen    Last Alcohol Screening Score (AUDIT): 0  Housing: Unknown (06/01/2024)   Epic    Unable to Pay for Housing in the Last Year: No    Number of Times Moved in the Last Year: Not on file  Homeless in the Last Year: No  Utilities: Not At Risk (06/08/2024)   Epic    Threatened with loss of utilities: No  Health Literacy: Adequate Health Literacy (06/08/2024)   B1300 Health Literacy    Frequency of need for help with medical instructions: Never     Review of Systems   Gen: Denies any fever, chills, fatigue, weight loss, lack of appetite.  CV: Denies chest pain, heart palpitations, peripheral edema, syncope.  Resp: Denies shortness of breath at rest or with exertion. Denies wheezing or cough.  GI: see HPI GU : Denies urinary burning, urinary frequency, urinary hesitancy MS: Denies joint pain, muscle weakness, cramps, or limitation of movement.  Derm: Denies rash, itching, dry skin Psych: Denies depression, anxiety, memory loss, and confusion Heme: Denies bruising, bleeding, and enlarged lymph nodes.  Physical Exam   There were no vitals taken for this visit.  General:   Alert and oriented. Pleasant and cooperative. Well-nourished and well-developed.  Head:  Normocephalic and atraumatic. Eyes:  Without icterus, sclera clear and conjunctiva pink.  Ears:  Normal auditory acuity. Mouth:  No deformity or lesions, oral mucosa pink.  Lungs:  Clear to auscultation bilaterally. No wheezes, rales, or rhonchi. No distress.  Heart:  S1, S2 present without murmurs appreciated.  Abdomen:  +BS, soft, non-tender and non-distended. No HSM noted. No guarding or rebound. No masses appreciated.  Rectal:  deferred *** Msk:  Symmetrical without gross deformities. Normal posture. Extremities:  Without edema. Neurologic:  Alert and  oriented x4;  grossly normal neurologically. Skin:  Intact without significant lesions or rashes. Psych:  Alert and cooperative. Normal mood and affect.  Assessment & Plan   DORANNE SCHMUTZ is a 78 y.o. female with a history of GERD, depression, arthritis, HLD, HTN, colon polyps, and insomnia presenting today with ***     Follow up   ***Follow up ***   Charmaine Melia, MSN, FNP-BC, AGACNP-BC [facility] "

## 2024-09-19 ENCOUNTER — Encounter: Payer: Self-pay | Admitting: Gastroenterology

## 2024-09-19 ENCOUNTER — Ambulatory Visit: Admitting: Gastroenterology

## 2024-09-19 ENCOUNTER — Telehealth: Payer: Self-pay | Admitting: Gastroenterology

## 2024-09-19 VITALS — BP 132/79 | HR 89 | Temp 98.0°F | Ht 64.0 in | Wt 170.0 lb

## 2024-09-19 DIAGNOSIS — K581 Irritable bowel syndrome with constipation: Secondary | ICD-10-CM

## 2024-09-19 NOTE — Patient Instructions (Addendum)
 Start Linzess  145 mcg daily (take 2 of your Linzess  72 mcg capsules) daily.  I also want you to start taking a fiber supplement like Metamucil, and store brand psyllium is fine.     If you take the powder you should take 1 tablespoon once daily in at least 8 oz of water .   I also want you to increase your water  intake to at least 4 glasses of water  daily.  Can provide you a handout about IBS as well to make some dietary changes that can help also.  We will follow-up in 2 months, please let me know if in the meantime how the Linzess  145 mcg is working for you and if it is effective then I will send a prescription and if you would like to trial a different medication just see me a MyChart message or call the office and we can provide you some samples of either Trulance or Ibsrela as different constipation options.  It was a pleasure to see you today. I want to create trusting relationships with patients. If you receive a survey regarding your visit,  I greatly appreciate you taking time to fill this out on paper or through your MyChart. I value your feedback.  Charmaine Melia, MSN, FNP-BC, AGACNP-BC [facility]

## 2024-09-19 NOTE — Telephone Encounter (Signed)
 Please submit for Trio smart breath test for the patient sometime within the next 1-2 weeks.  It will be the Du pont with glucose and please select bloating and constipation for the diagnosis codes.  Charmaine Melia, MSN, APRN, FNP-BC, AGACNP-BC [facility]

## 2024-09-30 NOTE — Telephone Encounter (Signed)
 Pt is coming by the office to sign for the Trio-smart breath test on Monday

## 2024-10-04 ENCOUNTER — Other Ambulatory Visit: Payer: Self-pay | Admitting: Family Medicine

## 2024-10-14 ENCOUNTER — Other Ambulatory Visit: Payer: Self-pay | Admitting: *Deleted

## 2024-10-14 ENCOUNTER — Telehealth: Payer: Self-pay | Admitting: *Deleted

## 2024-10-14 MED ORDER — IBSRELA 50 MG PO TABS: ORAL_TABLET | ORAL | Status: AC

## 2024-10-14 NOTE — Telephone Encounter (Signed)
 Pt came and picked up samples of Ibsrela

## 2024-10-14 NOTE — Telephone Encounter (Signed)
 Medication Samples have been provided to the patient.  Drug name: Ibsrela        Strength: 50mg         Qty: 3 boxes  LOT: [id number]  Exp.Date: 11/20/2026  Dosing instructions: Take 1 tablet daily   The patient has been instructed regarding the correct time, dose, and frequency of taking this medication, including desired effects and most common side effects.   Adrien Charmaine Jean 10:05 AM 10/14/2024

## 2024-10-19 NOTE — Progress Notes (Signed)
 "     History of Present Illness:  This woman returns for follow-up.  She has a history of overactive bladder.  She has been on Gemtesa .  This is not well covered by her insurance, however.  She is not on any medication now.  She completed a course of PTNS in late December 2025.  She had 1 cycle before that about 5 years ago at [facility] in [place].    Past Medical History:  Diagnosis Date   Acid reflux    Arthritis    Depression    Family history of adverse reaction to anesthesia    Mother woke up and could hearduring surgery   Hypercholesterolemia    Hypertension    Insomnia    Osteoporosis    Panic attacks    panic attacks   Reflux    Restless leg syndrome     Past Surgical History:  Procedure Laterality Date   ABDOMINAL HYSTERECTOMY  2001   BLADDER SURGERY     BREAST BIOPSY  1987   BREAST CYST EXCISION  1998   CARPAL TUNNEL RELEASE Right 04/06/2015   Procedure: CARPAL TUNNEL RELEASE;  Surgeon: Taft FORBES Minerva, MD;  Location: [facility];  Service: Orthopedics;  Laterality: Right;   CARPAL TUNNEL RELEASE Left 06/30/2022   Procedure: LEFT CARPAL TUNNEL RELEASE;  Surgeon: Murrell Drivers, MD;  Location: [facility];  Service: Orthopedics;  Laterality: Left;   COLONOSCOPY  09/19/2004   MFM:Pwuzmwjo hemorrhoids, otherwise normal rectum/Sigmoid diverticula.  Remainder of colon mucosa appeared normal   COLONOSCOPY WITH ESOPHAGOGASTRODUODENOSCOPY (EGD) N/A 06/24/2013   MFM:Hjdumpr polyp-removed. Small hiatal hernia. Abnormal gastric mucosa-query NSAID effect-s/p (chronic inactive gastritis, negative H. pylori)/TCS: Prominent internal hemorrhoids-likely source of hematochezia. Colonic diverticulosis. Colonic polyp-removed (tubular adenoma)   COLONOSCOPY WITH PROPOFOL  N/A 11/01/2018   Procedure: COLONOSCOPY WITH PROPOFOL ;  Surgeon: Shaaron Lamar HERO, MD;  [facility];  Service: Endoscopy;  Laterality: N/A;  7:30am   CYSTOCELE REPAIR  2010   DILATION AND  CURETTAGE OF UTERUS     JOINT REPLACEMENT N/A    Phreesia 03/02/2020   KNEE SURGERY Right 2003   arthroscopic left   RECTAL SURGERY     RECTOCELE REPAIR  2010   REPLACEMENT TOTAL KNEE Right    TONSILLECTOMY     TOTAL KNEE ARTHROPLASTY Right 05/09/2019   Procedure: TOTAL KNEE ARTHROPLASTY;  Surgeon: Rubie Kemps, MD;  Location: [facility];  Service: Orthopedics;  Laterality: Right;    Home Medications:  (Not in a hospital admission)   Allergies: Allergies[1]  Family History  Problem Relation Age of Onset   Breast cancer Mother    Hypertension Mother    Cancer Mother        Breast   Hyperlipidemia Mother    Osteoporosis Mother    Hyperlipidemia Father    Hypertension Father    Heart attack Father    Breast cancer Sister    Cancer Sister        Breast   Breast cancer Sister    Cancer Sister        breast   Breast cancer Sister    Colon cancer Neg Hx     Social History:  reports that she has never smoked. She has never used smokeless tobacco. She reports that she does not drink alcohol and does not use drugs.    I have reviewed prior pt notes  I have reviewed urinalysis results--clear   Impression/Assessment:  Overactive bladder-wet.  She had  some improvement from her PTNS.  Not currently on any medical therapy due to lack of insurance coverage.  From what I can see, she has not been tried on other OAB medications  Plan:   - I will start her on solifenacin   - Overactive bladder guide sheet given  - I will have her come back in about 3 months to see Dr. Nieves who has seen her before when she was in [facility] for PTNS      [1] No Active Allergies  "

## 2024-10-25 ENCOUNTER — Ambulatory Visit: Admitting: Urology

## 2024-10-25 VITALS — BP 147/86 | HR 94

## 2024-10-25 DIAGNOSIS — N952 Postmenopausal atrophic vaginitis: Secondary | ICD-10-CM

## 2024-10-25 DIAGNOSIS — N3281 Overactive bladder: Secondary | ICD-10-CM

## 2024-10-25 DIAGNOSIS — N3941 Urge incontinence: Secondary | ICD-10-CM

## 2024-10-25 LAB — BLADDER SCAN AMB NON-IMAGING: Scan Result: 0

## 2024-10-25 MED ORDER — SOLIFENACIN SUCCINATE 10 MG PO TABS: 10.0000 mg | ORAL_TABLET | Freq: Every day | ORAL | 3 refills | Status: AC

## 2024-10-26 LAB — URINALYSIS, ROUTINE W REFLEX MICROSCOPIC
Bilirubin, UA: NEGATIVE
Glucose, UA: NEGATIVE
Ketones, UA: NEGATIVE
Leukocytes,UA: NEGATIVE
Nitrite, UA: NEGATIVE
Protein,UA: NEGATIVE
RBC, UA: NEGATIVE
Specific Gravity, UA: <1.005 — ABNORMAL LOW (ref 1.005–1.030)
Urobilinogen, Ur: 0.2 mg/dL (ref 0.2–1.0)
pH, UA: 5.5 (ref 5.0–7.5)

## 2024-11-21 ENCOUNTER — Ambulatory Visit: Admitting: Gastroenterology

## 2025-01-26 ENCOUNTER — Ambulatory Visit: Admitting: "Endocrinology

## 2025-04-10 ENCOUNTER — Encounter: Admitting: Family Medicine

## 2025-06-14 ENCOUNTER — Ambulatory Visit
